# Patient Record
Sex: Female | Born: 1950 | Race: White | Hispanic: No | Marital: Married | Smoking: Current every day smoker
Health system: Southern US, Community
[De-identification: ages and names within clinical notes are randomized; demographics above are authoritative.]

## PROBLEM LIST (undated history)

## (undated) DIAGNOSIS — I2510 Atherosclerotic heart disease of native coronary artery without angina pectoris: Secondary | ICD-10-CM

## (undated) DIAGNOSIS — J449 Chronic obstructive pulmonary disease, unspecified: Secondary | ICD-10-CM

## (undated) DIAGNOSIS — E785 Hyperlipidemia, unspecified: Secondary | ICD-10-CM

## (undated) DIAGNOSIS — D649 Anemia, unspecified: Secondary | ICD-10-CM

## (undated) DIAGNOSIS — I714 Abdominal aortic aneurysm, without rupture, unspecified: Secondary | ICD-10-CM

## (undated) DIAGNOSIS — J439 Emphysema, unspecified: Secondary | ICD-10-CM

## (undated) DIAGNOSIS — K219 Gastro-esophageal reflux disease without esophagitis: Secondary | ICD-10-CM

## (undated) DIAGNOSIS — M1990 Unspecified osteoarthritis, unspecified site: Secondary | ICD-10-CM

## (undated) DIAGNOSIS — K8020 Calculus of gallbladder without cholecystitis without obstruction: Secondary | ICD-10-CM

## (undated) DIAGNOSIS — I219 Acute myocardial infarction, unspecified: Secondary | ICD-10-CM

## (undated) DIAGNOSIS — I10 Essential (primary) hypertension: Secondary | ICD-10-CM

## (undated) DIAGNOSIS — J45909 Unspecified asthma, uncomplicated: Secondary | ICD-10-CM

## (undated) DIAGNOSIS — I779 Disorder of arteries and arterioles, unspecified: Secondary | ICD-10-CM

## (undated) DIAGNOSIS — N1831 Chronic kidney disease, stage 3a: Secondary | ICD-10-CM

## (undated) DIAGNOSIS — I771 Stricture of artery: Secondary | ICD-10-CM

## (undated) DIAGNOSIS — I739 Peripheral vascular disease, unspecified: Secondary | ICD-10-CM

## (undated) HISTORY — DX: Abdominal aortic aneurysm, without rupture: I71.4

## (undated) HISTORY — DX: Essential (primary) hypertension: I10

## (undated) HISTORY — DX: Hyperlipidemia, unspecified: E78.5

## (undated) HISTORY — PX: TUBAL LIGATION: SHX77

## (undated) HISTORY — DX: Peripheral vascular disease, unspecified: I73.9

## (undated) HISTORY — DX: Chronic obstructive pulmonary disease, unspecified: J44.9

## (undated) HISTORY — DX: Chronic kidney disease, stage 3a: N18.31

## (undated) HISTORY — DX: Abdominal aortic aneurysm, without rupture, unspecified: I71.40

## (undated) HISTORY — DX: Disorder of arteries and arterioles, unspecified: I77.9

## (undated) HISTORY — DX: Unspecified osteoarthritis, unspecified site: M19.90

## (undated) HISTORY — PX: CATARACT EXTRACTION: SUR2

## (undated) HISTORY — DX: Acute myocardial infarction, unspecified: I21.9

## (undated) HISTORY — DX: Atherosclerotic heart disease of native coronary artery without angina pectoris: I25.10

## (undated) HISTORY — DX: Gastro-esophageal reflux disease without esophagitis: K21.9

## (undated) HISTORY — DX: Anemia, unspecified: D64.9

## (undated) HISTORY — DX: Stricture of artery: I77.1

## (undated) HISTORY — DX: Calculus of gallbladder without cholecystitis without obstruction: K80.20

## (undated) HISTORY — DX: Emphysema, unspecified: J43.9

## (undated) HISTORY — DX: Unspecified asthma, uncomplicated: J45.909

---

## 2005-02-04 HISTORY — PX: CARDIAC CATHETERIZATION: SHX172

## 2005-02-05 ENCOUNTER — Ambulatory Visit (HOSPITAL_COMMUNITY): Admission: RE | Admit: 2005-02-05 | Discharge: 2005-02-05 | Payer: Self-pay | Admitting: *Deleted

## 2005-02-06 ENCOUNTER — Inpatient Hospital Stay (HOSPITAL_COMMUNITY): Admission: RE | Admit: 2005-02-06 | Discharge: 2005-02-08 | Payer: Self-pay | Admitting: *Deleted

## 2007-04-10 ENCOUNTER — Ambulatory Visit: Payer: Self-pay | Admitting: *Deleted

## 2007-04-25 ENCOUNTER — Encounter (INDEPENDENT_AMBULATORY_CARE_PROVIDER_SITE_OTHER): Payer: Self-pay | Admitting: *Deleted

## 2007-04-25 ENCOUNTER — Ambulatory Visit: Payer: Self-pay | Admitting: *Deleted

## 2007-04-25 ENCOUNTER — Inpatient Hospital Stay (HOSPITAL_COMMUNITY): Admission: RE | Admit: 2007-04-25 | Discharge: 2007-04-26 | Payer: Self-pay | Admitting: *Deleted

## 2007-05-08 ENCOUNTER — Ambulatory Visit: Payer: Self-pay | Admitting: *Deleted

## 2007-09-25 ENCOUNTER — Ambulatory Visit (HOSPITAL_COMMUNITY): Admission: RE | Admit: 2007-09-25 | Discharge: 2007-09-25 | Payer: Self-pay | Admitting: Ophthalmology

## 2007-12-29 ENCOUNTER — Ambulatory Visit: Payer: Self-pay | Admitting: *Deleted

## 2009-12-21 ENCOUNTER — Ambulatory Visit (HOSPITAL_COMMUNITY): Admission: RE | Admit: 2009-12-21 | Discharge: 2009-12-21 | Payer: Self-pay | Admitting: Ophthalmology

## 2010-02-19 ENCOUNTER — Encounter: Payer: Self-pay | Admitting: Family Medicine

## 2010-04-11 LAB — BASIC METABOLIC PANEL
BUN: 9 mg/dL (ref 6–23)
Calcium: 8.9 mg/dL (ref 8.4–10.5)
Creatinine, Ser: 0.74 mg/dL (ref 0.4–1.2)

## 2010-04-11 LAB — HEMOGLOBIN AND HEMATOCRIT, BLOOD
HCT: 38.2 % (ref 36.0–46.0)
Hemoglobin: 12.9 g/dL (ref 12.0–15.0)

## 2010-06-13 NOTE — Op Note (Signed)
Kaitlin [name], Kaitlin [name]                ACCOUNT NO.:  [id number]   MEDICAL RECORD NO.:  [id number]          PATIENT TYPE:  INP   LOCATION:  2899                         FACILITY:  [facility]   PHYSICIAN:  Dorothea Glassman, M.D.    DATE OF BIRTH:  1950-06-20   DATE OF PROCEDURE:  04/25/2007  DATE OF DISCHARGE:                               OPERATIVE REPORT   SURGEON:  As P. Drucie Opitz, MD   ASSISTANTS:  Judeth Cornfield. Scot Dock, MD, and Rocky Morel, RNFA.   ANESTHETIC:  General endotracheal.   ANESTHESIOLOGIST:  Nelda Severe. Tobias Alexander, MD.   PREOPERATIVE DIAGNOSIS:  Severe right internal carotid artery stenosis.   POSTOPERATIVE DIAGNOSIS:  Severe right internal carotid artery stenosis.   PROCEDURE:  Right carotid endarterectomy with Dacron patch angioplasty.   COMPLICATIONS:  None apparent.   CLINICAL NOTE:  Kaitlin [name] is a 60 year old female with a history of  heavy tobacco use.  She has a progressive asymptomatic right internal  carotid artery stenosis.   Seen in consultation in the office, she consented for right carotid  endarterectomy.  Risks of the operative procedure were reviewed with the  patient prior to surgery with a major morbidity and mortality of 1-2% to  include but not limited MI, CVA, cranial nerve injury and death.   OPERATIVE PROCEDURE:  The patient brought to the operating room in  stable hemodynamic condition.  Placed under general endotracheal  anesthesia.  Foley catheter, arterial line in place.  The right neck  prepped and draped in a sterile fashion.   A curvilinear skin incision made on the anterior border of the right  sternomastoid muscle.  Dissection carried down through subcutaneous  tissue with electrocautery.  Platysma incised.  Deep dissection carried  along the upper border of the sternomastoid to expose the vessels.  The  facial vein ligated with 2-0 silk and divided.  The right common carotid  artery mobilized down to the omohyoid muscle and encircled with  vessel  loop.  The origin of the superior thyroid and external carotid were  freed and encircled with vessel loops.  The internal carotid artery  followed distally up to the posterior belly of the digastric muscle and  encircled with a vessel loop.   The patient administered 7000 units of heparin intravenously.  Adequate  circulation time permitted.  The carotid vessels controlled with clamps.  A longitudinal arteriotomy made in the distal common carotid artery.  The arteriotomy extended across carotid bulb and up into the internal  carotid artery.  There was a fibrous plaque at the internal carotid  artery origin with a high-grade stenosis.  The arteriotomy extended  beyond the plaque into the internal carotid artery.  A shunt was  inserted.   The plaque raised with an endarterectomy elevator.  The endarterectomy  carried down into the common carotid artery, where the plaque was  divided transversely with Potts scissors.  The external carotid was  endarterectomized using an eversion technique.  The distal internal  carotid artery plaque feathered out well without distal flap.  Fragments  of plaque removed with fine  forceps.  The site irrigated with heparin  saline solution.   A patch angioplasty of the endarterectomy site then carried out with a  running 6-0 Prolene suture using a Finesse Dacron patch.  At completion  of the patch angioplasty, the shunt was removed and all vessels well-  flushed.  Clamps were removed directing initially antegrade flow up the  external carotid artery.  Following this, the internal carotid was  released.   Excellent pulse and Doppler signal in the distal internal carotid  artery.   The the patient administered 50 mg of protamine intravenously.  Adequate  hemostasis obtained.  Sponge and instrument counts correct.   Sternomastoid fascia closed with running 2-0 Vicryl suture.  Platysma  closed with running 3-0 Vicryl suture.  Skin closed with 4-0  Monocryl.  Dermabond applied.  No apparent complications.  The patient transferred  to the recovery room in stable condition, neurologically intact.      Dorothea Glassman, M.D.  Electronically Signed     PGH/MEDQ  D:  04/25/2007  T:  04/26/2007  Job:  [id number]   cc:   Quay Burow, M.D.  Sherrilee Gilles. Gerarda Fraction, MD

## 2010-06-13 NOTE — Procedures (Signed)
CAROTID DUPLEX EXAM   INDICATION:  Follow up carotid artery disease.   HISTORY:  Diabetes:  No.  Cardiac:  No.  Hypertension:  Yes.  Smoking:  Yes.  Previous Surgery:  Right CEA with DPA on 04/25/2007.  CV History:  No.  Amaurosis Fugax No, Paresthesias No, Hemiparesis No.                                       RIGHT             LEFT  Brachial systolic pressure:         110               111  Brachial Doppler waveforms:         Biphasic          Biphasic  Vertebral direction of flow:        Antegrade         Antegrade  DUPLEX VELOCITIES (cm/sec)  CCA peak systolic                   152               [id number]  ECA peak systolic                   146               0000000  ICA peak systolic                   147               0000000  ICA end diastolic                   59                58  PLAQUE MORPHOLOGY:                  N/A               Calcified  PLAQUE AMOUNT:                      N/A               Mild/moderate  PLAQUE LOCATION:                    N/A               ICA/bifurcation   IMPRESSION:  1. Right internal carotid artery velocities are suggestive of 40-59%      stenosis; however, no plaque visualized.  Velocities may be      elevated due to postoperative diameter reduction.  2. Left internal carotid artery velocities are suggestive of 40-59%      stenosis.   ___________________________________________  P. Drucie Opitz, M.D.   AS/MEDQ  D:  12/29/2007  T:  12/29/2007  Job:  [id number]

## 2010-06-13 NOTE — Assessment & Plan Note (Signed)
OFFICE VISIT   Kaitlin, [name] A  DOB:  1950-10-16                                       05/08/2007  [id number]   Patient underwent a right carotid endarterectomy for severe right  internal carotid artery stenosis, carried out 04/25/07 at [facility].  Discharged home in good condition.  No perioperative  complications.   At this time, she is doing well.  No major complaints.  Blood pressure  is 122/81, pulse 85 per minute, respirations 18 per minute.  Right neck  incision healing unremarkably.  Cranial nerves are intact.  Strength  equal bilaterally.   Patient is doing well following her recent right carotid endarterectomy.  We will plan a followup with her again in six months.   Dorothea Glassman, M.D.  Electronically Signed   PGH/MEDQ  D:  05/08/2007  T:  05/08/2007  Job:  [id number]   cc:   Quay Burow, M.D.  Sherrilee Gilles. Gerarda Fraction, MD

## 2010-06-13 NOTE — Consult Note (Signed)
NEW PATIENT CONSULTATION   Kaitlin, [name] A  DOB:  06/25/1950                                       04/10/2007  [id number]   REFERRING PHYSICIAN:  Quay Burow, MD.   PRIMARY CARE PHYSICIAN:  Redmond School, MD.   REASON FOR CONSULTATION:  Severe progressive right internal carotid  artery stenosis.   HISTORY:  Kaitlin [name] is a 60 year old white female referred at this  time for a progressive asymptomatic right internal carotid artery  stenosis.   Patient has no history of stroke.  She underwent a carotid Doppler  evaluation for [place] revealing a severe  stenosis of the right internal carotid artery with velocities of 387/177  cm/s on 03/28/2007.  A prior carotid Doppler carried out 11/28/2006  revealed velocities of 248/94 cm/s in the right internal carotid artery.  The left internal carotid artery reveals mild disease with a peak  systolic velocity of 123456 cm/s.  Vertebral flow is antegrade bilaterally.   Patient denies a history of documented stroke.  No visual disturbance.  No speech problems.  No motor or sensory deficit.   Risk factors for cerebrovascular disease include tobacco use and  hyperlipidemia.   PAST MEDICAL HISTORY:  1. Hyperlipidemia.  2. Tobacco abuse.  3. GERD.   MEDICATIONS:  1. Plavix 75 mg daily.  2. Lopressor 25 mg b.i.d.  3. Aspirin 81 mg daily.  4. TriCor 145 mg daily.  5. Omega-3 Fish Oil 1000 mg daily.   ALLERGIES:  MUSCLE RELAXERS CAUSE A RASH.   FAMILY HISTORY:  1. This is quite significant for coronary artery disease.  2. Her mother died at age 60 of an MI.  17. Father died at age 45 of an MI.  93. One brother deceased, age 48, of an MI.   SOCIAL HISTORY:  1. The patient is married with 3 children.  2. She is not working.  3. She just discontinued tobacco use.  She has smoked up to 2 packs of      cigarettes daily for many years.  4. No alcohol use.   REVIEW OF SYSTEMS:   Refer to Patient Encounter Form.  Patient notes a  history of some bronchitis.  She also has had a film-like visual  disturbance over her right eye.  Review of systems otherwise, including  GENERAL, CARDIAC, GI, GU, NEUROLOGIC, ORTHOPEDIC, PSYCHIATRIC, BLEEDING,  HEARING AND VISUAL DISTURBANCE is negative.   PHYSICAL EXAMINATION:  General:  A well-appearing 60 year old female.  Appears her stated age.  Alert and oriented.  In no acute distress.  Vital Signs:  BP is 121/74 left arm, right arm 130/77.  Heart rate is 77  per minute.  Respirations were 16 per minute with O2 saturation 99%.  HEENT:  Mouth and throat are clear.  Normocephalic.  No scleral icterus.  Neck:  No masses.  No thyromegaly.  Respiratory:  Equal air entry  bilaterally.  No rales or rhonchi.  Cardiovascular:  Right carotid  bruit.  Normal heart sounds without murmurs.  No gallops or rubs.  Regular rate and rhythm.  Abdomen:  Soft, nontender.  No masses or  organomegaly.  Bowel sounds normal.  Neurologic:  Cranial nerves intact.  Strengths equal bilaterally.  Reflexes 2+.  Normal gait.  Extremities:  Full range of motion.  No deformity.  No joint swelling.  IMPRESSION:  1. Severe progressive asymptomatic right internal carotid artery      stenosis.  2. Hyperlipidemia.  3. Gastroesophageal reflux disease.  4. Coronary artery disease status post stenting.   RECOMMENDATION:  Right carotid endarterectomy for reduction of stroke  risk.  Surgery is scheduled 04/25/2007 [place]  Plavix 04/21/2007, increase aspirin to 325 mg daily.   The potential risks of the operative procedure were reviewedwith a  measurement of morbidity, mortality 1%-2% to include, but not limited  to, MI, CVA, cranial nerve injury and death.   Dorothea Glassman, M.D.  Electronically Signed   PGH/MEDQ  D:  04/10/2007  T:  04/11/2007  Job:  [id number]   cc:   Quay Burow, M.D.  Sherrilee Gilles. Gerarda Fraction, MD

## 2010-06-16 NOTE — Discharge Summary (Signed)
Kaitlin [name], Kaitlin [name]                ACCOUNT NO.:  [id number]   MEDICAL RECORD NO.:  [id number]          PATIENT TYPE:  INP   LOCATION:  6533                         FACILITY:  [facility]   PHYSICIAN:  Leslye Peer, MD       DATE OF BIRTH:  14-May-1950   DATE OF ADMISSION:  02/06/2005  DATE OF DISCHARGE:  02/08/2005                                 DISCHARGE SUMMARY   DISCHARGE DIAGNOSES:  1.  Coronary disease, two-vessel intervention and stenting this admission by      Dr. Einar Gip.  2.  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  3.  Right internal carotid artery stenosis of 70%.  4.  Smoking.  5.  Dyslipidemia.  6.  Hypertension.  7.  Small enzyme leak after percutaneous coronary intervention this      admission.   HOSPITAL COURSE:  The patient is a 60 year old female followed by Dr. Gerarda [name]  and seen in the remote past by Dr. Mathis Bud. She has multiple risk factors  for coronary disease. She presented February 05, 2005 with chest pain  consistent with unstable angina. Cardiolite study in 2004 was negative. She  was admitted and catheterized by Dr. Einar Gip February 06, 2005. This revealed a  99% RCA. She received two nonDES stents to the RCA. The LAD also had a  proximal 95% stenosis and she received a Cypher stent to that site. There  was a residual 50-60% circumflex stenosis. EF was 65%. Right common iliac  was 60-70% and injection of the right carotid during RIMA injection revealed  a 60-70% stenosis. She tolerated the procedure well. There was an enzymes  leak postprocedure with a CK of 139 and 13 MB's. She was kept an extra 24  hours. Dr. Einar Gip feels she can be discharged today February 08, 2005.   DISCHARGE MEDICATIONS:  1.  Prilosec over-the-counter once a day.  2.  Metoprolol 12.5 milligrams b.i.d.  3.  Aspirin 81 milligrams a day.  4.  Nitroglycerin sublingual p.r.n.  5.  Plavix 75 milligrams a day.  6.  Vytorin 20/40 daily.   LABS:  White count 7.9, hemoglobin 14, hematocrit  41.1, platelets 273,000.  Sodium 140, potassium 3.8, BUN 11, creatinine 0.8. Liver functions were  normal. CK peaked at 136 with 8.8 MBs and troponin of 1.04. chest x-ray  shows COPD. EKG reveals a sinus rhythm with T-wave inversion in V2-V5.   DISPOSITION:  The patient was discharged in stable condition. We have  arranged for Plavix through [facility]. We will try to arrange  for her to get some Vytorin samples from the office with those are  available. She will see Dr. Mathis Bud in a couple weeks in the office.      Kaitlin [name], P.A.    ______________________________  Leslye Peer, MD    LKK/MEDQ  D:  02/08/2005  T:  02/08/2005  Job:  [id number]   cc:   Leslye Peer, MD  Fax: [place]. Kaitlin Fraction, MD  Fax: [phone]

## 2010-06-16 NOTE — Cardiovascular Report (Signed)
NAMEEVANJELINA, OCKERMAN                ACCOUNT NO.:  [id number]   MEDICAL RECORD NO.:  [id number]          PATIENT TYPE:  OIB   LOCATION:  6533                         FACILITY:  [facility]   PHYSICIAN:  Ulice Dash R. Einar Gip, MD       DATE OF BIRTH:  05/17/1950   DATE OF PROCEDURE:  02/06/2005  DATE OF DISCHARGE:                              CARDIAC CATHETERIZATION   ATTENDING CARDIOLOGIST:  Leslye Peer, M.D.   REFERRING PHYSICIAN:  Bonne Dolores, M.D.   PROCEDURE PERFORMED:  1.  Left ventriculography.  2.  Right and left coronary arteriography.  3.  Right innominate and left subclavian arteriography.  4.  Abdominal aortogram.  5.  Right femoral angiography.   INDICATIONS FOR PROCEDURE:  Ms. Anselma Morishita is a 60 year old Caucasian  female with a very strong family history of premature coronary artery  disease with history of GERD, history of ongoing tobacco use and  hyperlipidemia, who had abnormal physical exam and also had chest discomfort  typical for agnina pectoris.  Due to multiple cardiac risk factors and  ongoing chest pain, she underwent a Cardiolite stress test. This was low-  risk ischemia that was performed 2004.  However, because of her significant  risk factors, she was directed back to the cardiac catheterization lab to  reevaluate her coronary anatomy.   HEMODYNAMIC DATA:  The left ventricular pressure was  95/-4 with end-  diastolic pressure of 4 mmHg.  Aortic pressure 97/60 with a mean of 77 mmHg.  There was no PG across the AV.   ANGIOGRAPHIC DATA:  Left ventricle: Left ventricular systolic function was  normal, and the ejection fraction was estimated at 60 to 65%.  There was no  significant mitral regurgitation.   Right coronary artery: The right coronary artery was a large dominant  vessel. It gives origin to a large PDA and larger posterolateral artery  which supplies part of the inferior, inferoapical, and inferolateral walls.  There is a high-grade 99% stenosis in  the mid RCA.   Left main: Left main is a large caliber vessel.  It is long.  It is normal.   Circumflex:  The circumflex is a moderate caliber vessel.  It gives origin  to a small OM-1 and a moderate size OM-2 and continues as OM-3.  There is a  50 to at most 60% stenosis in the proximal segment of the circumflex.   LAD: LAD is a moderate to large caliber vessel.  It gives origin to a small  diagonal #1 and a very large diagonal #2 which is equivalent to the LAD.  The LAD ends at the apex.  At the origin of the diagonal #1, there is a high-  grade 95% stenosis.   Right innominate artery: The right innominate artery is a widely patent  right innominate artery and subclavian artery.  The left vertebral carotid  artery which was visualized shows 60 to at most 70% stenosis at its origin.  However, this visualization is suboptimal.   Left subclavian artery: The left subclavian artery and LIMA are widely  patent.  Again, the  left vertebral artery shows 10 to 20% ostial stenosis.  The left carotid artery appears to be widely patent.   Abdominal aortogram: Abdominal aortogram reveals two renal arteries, one on  either side.  They are widely patent.  The right iliac bifurcation was  widely patent.  The right external iliac artery appears to have a 60 at most  70% stenosis.  The femoral artery was widely patent.   IMPRESSION:  1.  Normal end-diastolic function, ejection fraction 60 to 65%, no      significant mitral regurgitation.  2.  High-grade stenosis of a very large dominant right coronary artery in      the mid segment constituting 99% stenosis.  3.  Intermediate lesion into the proximal circumflex, 50 to 60% stenosis.  4.  High-grade 95% stenosis mid left anterior descending artery.  5.  Right internal carotid artery appears to have a 60 to 70% stenosis;      however, the visualization is suboptimal.  6.  Right external iliac artery appears to have a 60 at most 70% stenosis.       The right femoral artery is widely patent.   INTERVENTIONAL DATA:  Successful PTCA and stenting of the mid right coronary  artery with a 4 x 12 mm Vision.  There was proximal dissection noted which  had to be stented with an overlapping 4 x 18 mm Vision stent.  These stents  were post dilated with a 4.5 x 8 mm Quantum at 10 atmospheres of pressure.  There was a very small waist noted at the tightest lesion.  However, because  of the fear of perforation, the lesion was left alone.  Brisk flow without  any haziness was noted at the end of the procedure.   Successful PTCA and stenting of the mid LAD with a 2 x 18 mm Cypher deployed  at 16 atmospheres of pressure.  This stent was post dilated with a 3.25 x 15  mm Quantum at 16 atmospheres of pressure.  There was a nice step-up and a  step-down with reduction of stenosis from 95% to 0%.  Excellent brisk flow  was noted.   RECOMMENDATIONS:  The patient will be continued on risk factor modification.  If she has chest pain, consider outpatient Cardiolite stress test for  evaluation of circumflex significance; otherwise continue with risk factor  modification as indicated.  She may need evaluation for carotid artery and  also peripheral arterial disease.  Continued risk factor modification is  indicated.   A total of 350 mL of contrast was utilized for diagnostic and interventional  procedures.   DIAGNOSTIC PROCEDURE:  Using a 6-French right femoral artery access, 6-  [place] multipurpose B2 catheter was advanced through the ascending aorta  with 0.02 of Angio. The catheter was gently advanced to the left ventricle.  Left ventricular pressure marked.  Hand contrast injection of the left  ventricle was performed both in the LAO and RAO projections.  Catheter was  gently pulled back  to ascending aorta.  Right coronary artery selectively  engaged and angiography was performed.  Then the catheter was pulled back into the arch of the aorta, right  innominate artery, and left subclavian  artery selectively cannulated.  Angiography was performed. Then the catheter  was pulled back in the abdominal aorta, and abdominal aortogram was  performed.  Then the catheter was pulled out of the body in the usual  fashion.  A 6-French Judkins left 4 diagnostic catheter was advanced  and  engaged in the left main coronary artery, and angiography was performed.  Then the catheter was pulled out of the body in the usual fashion.   TECHNIQUE OF INTERVENTION:  Using Angiomax for anticoagulation and patient  having been randomized to CHAMPION trial, the 6-French sheath was exchanged  for a 7-French sheath.  Initially an AR1 catheter and then eventually AL1  catheter was utilized to engage the ostium of the right coronary artery, and  angiography was performed.  A 190 cm x 0.014 inch ATW guidewire was utilized  as a guidewire and crossed through the RCA, and lesion length was measured.  Then direct stenting with a 4 x 12 mm Vision was performed with reduction in  stenosis from 99% to less than 80% with a significant raise in its mid  segment.  This was performed at 14 atmospheres of pressure for 25 seconds.  A second inflation at 16 atmospheres of pressure for 29 seconds was  performed, and this led to a dissection in the proximal segment.  This  dissection was dilated with a stent balloon at 6 atmospheres of pressure for  27 seconds.  Because of persistence of dissection and significant waist in  the stented segment, a 4 x 18 mm Vision stent was advanced into the RCA and  overlapped with the previously placed stent at the inflow, and the stent was  deployed at 10 atmospheres of pressure for 47 seconds, and then the same  stent balloon was pushed between the two stents, and a second inflation of  15 atmospheres pressure for 30 seconds was performed.  Because of  persistence of waist in its mid segment, a 4.5 x 8 mm Quantum was utilized,  and inflation at  10 atmospheres of pressure for 45 seconds was performed.  Balloon was deflated and pulled back into the guiding catheter.  Angiography  was performed.  During the procedure, intracoronary nitroglycerin was also  administered.  Excellent distal flow was noted with excellent angiographic  appearance.  Hence, the guide catheter was disengaged and pulled out of the  body in the usual fashion.   Attention was then directed towards the stenting of the LAD.  A 7-French FL4  guide was advanced in the left coronary artery.  Using an ATW same marker  wire, the LAD lesion was crossed, and lesion length was measured.  This LAD  stenosis was dilated with a 3 x 15 mm Quantum Maverick.  This balloon was  occlusive prior to balloon inflation.  PTCA was performed at 10 atmospheres  of pressure for 15 seconds and second inflation at 12 atmospheres of  pressure for 45 seconds.  Then stenting was performed using a Cypher 5.3 x 18 mm at 16 atmospheres of pressure in the mid LAD.  This stent covered the  proximal part of the LAD and also the mid LAD.  This stent was post dilated  with a 3.25 x 15 mm Quantum at 16 atmospheres of pressure for 45 seconds.  There was a nice step-up and step-down noted in the LAD.  Brisk TIMI-3 flow  was noted.  After confirming the success, the guidewire was pulled out.  The  guide catheter was disengaged and pulled out of the body in the usual  fashion.  The patient tolerated the procedure well.  No immediate  complications were noted.  A total of 350 mL of contrast was utilized for  diagnostic and interventional procedure.      Eden Lathe. Einar Gip,  MD  Electronically Signed     JRG/MEDQ  D:  02/06/2005  T:  02/06/2005  Job:  [id number]   cc:   Leslye Peer, MD  Fax: [phone]   Bonne Dolores, M.D.  Fax: [phone]

## 2010-10-23 LAB — URINALYSIS, ROUTINE W REFLEX MICROSCOPIC
Bilirubin Urine: NEGATIVE
Glucose, UA: NEGATIVE
Ketones, ur: NEGATIVE
Protein, ur: NEGATIVE
Specific Gravity, Urine: 1.006

## 2010-10-23 LAB — CBC
MCHC: 34
MCV: 91.1
MCV: 91.9
Platelets: 196
RBC: 3.88
RBC: 4.68
RDW: 13.1
WBC: 7

## 2010-10-23 LAB — PROTIME-INR: INR: 0.9

## 2010-10-23 LAB — COMPREHENSIVE METABOLIC PANEL
Albumin: 3.6
Alkaline Phosphatase: 97
CO2: 29
Chloride: 103
GFR calc non Af Amer: >60
Potassium: 3.8
Sodium: 141
Total Bilirubin: 0.3

## 2010-10-23 LAB — ABO/RH: ABO/RH(D): O POS

## 2010-10-23 LAB — TYPE AND SCREEN: ABO/RH(D): O POS

## 2010-10-23 LAB — APTT: aPTT: 23 — ABNORMAL LOW

## 2010-10-23 LAB — BASIC METABOLIC PANEL
CO2: 26
Calcium: 8.2 — ABNORMAL LOW
Creatinine, Ser: 0.8
GFR calc Af Amer: >60
GFR calc non Af Amer: >60
Glucose, Bld: 86
Sodium: 141

## 2010-10-27 LAB — BASIC METABOLIC PANEL
CO2: 24
Calcium: 9.1
GFR calc Af Amer: >60
Glucose, Bld: 172 — ABNORMAL HIGH
Potassium: 3.5
Sodium: 135

## 2010-10-27 LAB — HEMOGLOBIN AND HEMATOCRIT, BLOOD: HCT: 42.1

## 2010-12-14 DIAGNOSIS — I739 Peripheral vascular disease, unspecified: Secondary | ICD-10-CM

## 2010-12-14 HISTORY — DX: Peripheral vascular disease, unspecified: I73.9

## 2012-06-13 ENCOUNTER — Other Ambulatory Visit: Payer: Self-pay | Admitting: *Deleted

## 2012-06-13 MED ORDER — CLOPIDOGREL BISULFATE 75 MG PO TABS: 75.0000 mg | ORAL_TABLET | Freq: Every day | ORAL | Status: DC

## 2012-06-23 ENCOUNTER — Encounter: Payer: Self-pay | Admitting: *Deleted

## 2012-06-25 ENCOUNTER — Other Ambulatory Visit: Payer: Self-pay | Admitting: *Deleted

## 2012-06-25 MED ORDER — METOPROLOL TARTRATE 50 MG PO TABS: 50.0000 mg | ORAL_TABLET | Freq: Every day | ORAL | Status: DC

## 2012-06-26 ENCOUNTER — Ambulatory Visit: Payer: Self-pay | Admitting: Cardiovascular Disease

## 2012-07-04 ENCOUNTER — Ambulatory Visit (HOSPITAL_COMMUNITY)
Admission: RE | Admit: 2012-07-04 | Discharge: 2012-07-04 | Disposition: A | Discharge status: Home / Self Care | Payer: 59 | Source: Ambulatory Visit | Attending: Family Medicine | Admitting: Family Medicine

## 2012-07-04 ENCOUNTER — Other Ambulatory Visit (HOSPITAL_COMMUNITY): Payer: Self-pay | Admitting: Family Medicine

## 2012-07-04 DIAGNOSIS — M5126 Other intervertebral disc displacement, lumbar region: Secondary | ICD-10-CM | POA: Insufficient documentation

## 2012-07-04 DIAGNOSIS — I714 Abdominal aortic aneurysm, without rupture, unspecified: Secondary | ICD-10-CM | POA: Insufficient documentation

## 2012-07-04 DIAGNOSIS — M545 Low back pain, unspecified: Secondary | ICD-10-CM | POA: Insufficient documentation

## 2012-07-04 DIAGNOSIS — M51379 Other intervertebral disc degeneration, lumbosacral region without mention of lumbar back pain or lower extremity pain: Secondary | ICD-10-CM | POA: Insufficient documentation

## 2012-07-04 DIAGNOSIS — M47817 Spondylosis without myelopathy or radiculopathy, lumbosacral region: Secondary | ICD-10-CM | POA: Insufficient documentation

## 2012-07-04 DIAGNOSIS — M5137 Other intervertebral disc degeneration, lumbosacral region: Secondary | ICD-10-CM | POA: Insufficient documentation

## 2012-07-04 LAB — POCT I-STAT, CHEM 8
Chloride: 107 mEq/L (ref 96–112)
HCT: 43 % (ref 36.0–46.0)
Hemoglobin: 14.6 g/dL (ref 12.0–15.0)
Potassium: 3.6 mEq/L (ref 3.5–5.1)
Sodium: 140 mEq/L (ref 135–145)

## 2012-07-04 MED ORDER — IOHEXOL 350 MG/ML SOLN
100.0000 mL | Freq: Once | INTRAVENOUS | Status: AC | PRN
  Administered 2012-07-04: 100 mL via INTRAVENOUS

## 2012-07-04 NOTE — Progress Notes (Signed)
Blood sample obtained from right arm IV for Creatnine level.  

## 2012-07-17 ENCOUNTER — Other Ambulatory Visit: Payer: Self-pay | Admitting: Cardiovascular Disease

## 2012-08-25 ENCOUNTER — Other Ambulatory Visit: Payer: Self-pay | Admitting: *Deleted

## 2012-08-25 MED ORDER — METOPROLOL TARTRATE 50 MG PO TABS: 50.0000 mg | ORAL_TABLET | Freq: Every day | ORAL | Status: DC

## 2012-08-25 NOTE — Telephone Encounter (Signed)
Rx was sent to pharmacy electronically. 

## 2012-10-16 ENCOUNTER — Encounter: Payer: Self-pay | Admitting: *Deleted

## 2012-10-17 ENCOUNTER — Encounter: Payer: Self-pay | Admitting: Cardiology

## 2012-10-17 ENCOUNTER — Ambulatory Visit (INDEPENDENT_AMBULATORY_CARE_PROVIDER_SITE_OTHER): Payer: 59 | Admitting: Cardiology

## 2012-10-17 ENCOUNTER — Other Ambulatory Visit: Payer: Self-pay | Admitting: Cardiovascular Disease

## 2012-10-17 VITALS — BP 112/74 | HR 91 | Ht 61.0 in | Wt 126.1 lb

## 2012-10-17 DIAGNOSIS — I6521 Occlusion and stenosis of right carotid artery: Secondary | ICD-10-CM

## 2012-10-17 DIAGNOSIS — I6529 Occlusion and stenosis of unspecified carotid artery: Secondary | ICD-10-CM

## 2012-10-17 DIAGNOSIS — I739 Peripheral vascular disease, unspecified: Secondary | ICD-10-CM

## 2012-10-17 DIAGNOSIS — F17200 Nicotine dependence, unspecified, uncomplicated: Secondary | ICD-10-CM

## 2012-10-17 DIAGNOSIS — Z720 Tobacco use: Secondary | ICD-10-CM | POA: Insufficient documentation

## 2012-10-17 DIAGNOSIS — I10 Essential (primary) hypertension: Secondary | ICD-10-CM

## 2012-10-17 DIAGNOSIS — E785 Hyperlipidemia, unspecified: Secondary | ICD-10-CM

## 2012-10-17 DIAGNOSIS — I25810 Atherosclerosis of coronary artery bypass graft(s) without angina pectoris: Secondary | ICD-10-CM

## 2012-10-17 DIAGNOSIS — I2510 Atherosclerotic heart disease of native coronary artery without angina pectoris: Secondary | ICD-10-CM | POA: Insufficient documentation

## 2012-10-17 DIAGNOSIS — E782 Mixed hyperlipidemia: Secondary | ICD-10-CM

## 2012-10-17 MED ORDER — LISINOPRIL 5 MG PO TABS: 5.0000 mg | ORAL_TABLET | Freq: Every day | ORAL | Status: DC

## 2012-10-17 MED ORDER — METOPROLOL SUCCINATE ER 25 MG PO TB24: 25.0000 mg | ORAL_TABLET | Freq: Every day | ORAL | Status: DC

## 2012-10-17 NOTE — Progress Notes (Signed)
Clinical Summary [name] is a 62 y.o.female former patient of [facility]  1. CAD - w/ DES to LAD and BMSx2 to RCA Jan 2007, reported normal LV function from notes. The indication was unstable angina. - the RCA PCI was complicated by dissection w/ was also stented - denies any chest pain. Denies any significant SOB or DOE. Walks on a regular basis without any significant symptoms, this is her highest level of exertion - compliant w/ meds: ASA 81, plavix, crestor, metoprolol, fish oil.   2. PAD - 60-70% right external iliac artery stenosis - denies any significant claudication, no rest pain, no sores on feet  3. Carotid stenosis - prior right carotid endarterectomy - denies any vision changes, no slurred speech, no focal neuro deficits.   4. HTN - compliant w/ meds - does not check bp at home  5. HL - on statin, compliant.  - has not had recent panel  6. Tobacco - continue to smoke, has tried NRT and nicotine in the past w/ little benefit   Past Medical History  Diagnosis Date  . CAD (coronary artery disease)     stents  . Hypertension   . Hyperlipidemia   . Iliac artery stenosis, right     60%-70%  . PAD (peripheral artery disease) 12/14/2010    in the left vertebral, bilateral carotids  . GERD (gastroesophageal reflux disease)      NKDA   Current Outpatient Prescriptions  Medication Sig Dispense Refill  . aspirin 81 MG tablet Take 81 mg by mouth daily.      . clopidogrel (PLAVIX) 75 MG tablet Take 1 tablet (75 mg total) by mouth daily.  30 tablet  6  . CRESTOR 40 MG tablet TAKE ONE TABLET DAILY AT BEDTIME  30 tablet  1  . ezetimibe (ZETIA) 10 MG tablet Take 10 mg by mouth daily.      . fish oil-omega-3 fatty acids 1000 MG capsule Take 1 g by mouth daily.      . metoprolol (LOPRESSOR) 50 MG tablet Take 1 tablet (50 mg total) by mouth daily.  30 tablet  0   No current facility-administered medications for this visit.     Past Surgical  History  Procedure Laterality Date  . Cardiac catheterization  02/04/2005    A cypher 5.3 x 18 mm at 16 atmosphere of pressure in the mid LAD, this stent covered the proximal part of the LAD and also the mid LAD, this was then post dilated with 3.25 x 15 mm Quantum at 16 atmosphereof pressure for 45 seconds     Family History Patients reports several family members have cardiovascular disease   Social History + tobacco history, 1/2PPD x 40 years. No EtoH. She works as Freight forwarder  Review of Systems 12 point ROS negative other than reported in HPI  Physical Examination p 91 bp 112/74 Wt 126 lbs BMI 23 Gen: resting comfortably, NAD HEENT: no scleral icterus, pupils equal round and reactive, no palptable cervical adenopathy CV Pulm: CTAB Abd: soft, NT, ND NABS, no hepatosplenomegaly Ext: warm, no edema.  Skin: warm, no rash Neuro: A&Ox3, no focal deficits    Diagnostic Studies  2007 cath: LM normal, LAD 95%, RCA 99%, LCX 60-70% lesion. Patent renal arteries. LAD and RCA stented, myoview done after showed no ischemia including in the LCX territory.   07/2006 Myoview: No perfusion defects, LVEF 68%  11/2010 Carotid Doppler: Right CEA patent. Left ICA 0-49%.  Assessment and Plan  1.CAD: no current symptoms, appears to have been left on long term plavix due to the complex PCI w/ overlapping stents in the RCA. - continue current meds, will change metoprolol to Toprol XL to increase her compliance, she often forgets to take it bid - start ASA in the setting of CV disease  2. HTN: bp at goal - changing to Toprol to increase compliance - add low dose ACE-I for benefits in CV patients - check BMP in 3 weeks  3. HL: continue current therapy, no recent panel in our system - check lipid panel   4. Carotid stenosis: prior right endarterectomy - most recent Dopplers show patency of CEA, no significant stenosis on left.  - no significant murmur on exam - continue  surveillance  5. PAD: seen on prior cath - no current symptoms - continue current medical therapy - counseled on tobacco cessation  6. Tobacco - precontemplative, encouraged to stop given multiple related comorbidities   F/u 1 month  Arnoldo Lenis, M.D., F.A.C.C.

## 2012-10-17 NOTE — Telephone Encounter (Signed)
Patient is now seeing Dr. Carlyle Dolly at

## 2012-10-17 NOTE — Patient Instructions (Addendum)
Your physician recommends that you schedule a follow-up appointment in: [place] recommends that you return for lab work in: [place] (Your physician recommends that you have follow up lab work, we will mail you a reminder letter to alert you when to go [employer], located across the street from our office.)  Your physician has recommended you make the following change in your medication:   1) STOP TAKING LOPRESSOR 2) START TAKING TOPROL XL 25 MG ONCE DAILY 3) START TAKING LISINOPRIL 5MG  ONCE DAILY

## 2012-10-22 ENCOUNTER — Encounter: Payer: Self-pay | Admitting: *Deleted

## 2012-10-22 ENCOUNTER — Other Ambulatory Visit: Payer: Self-pay | Admitting: *Deleted

## 2012-10-22 DIAGNOSIS — I10 Essential (primary) hypertension: Secondary | ICD-10-CM

## 2012-10-22 DIAGNOSIS — E782 Mixed hyperlipidemia: Secondary | ICD-10-CM

## 2012-10-23 ENCOUNTER — Ambulatory Visit: Payer: 59 | Admitting: Cardiovascular Disease

## 2012-10-25 LAB — LIPID PANEL
HDL: 43 mg/dL (ref >39)
LDL Cholesterol: 61 mg/dL (ref 0–99)
Total CHOL/HDL Ratio: 3 Ratio
Triglycerides: 123 mg/dL (ref <150)

## 2012-10-25 LAB — BASIC METABOLIC PANEL
CO2: 28 mEq/L (ref 19–32)
Chloride: 109 mEq/L (ref 96–112)
Potassium: 4.1 mEq/L (ref 3.5–5.3)
Sodium: 141 mEq/L (ref 135–145)

## 2012-10-29 ENCOUNTER — Encounter: Payer: Self-pay | Admitting: *Deleted

## 2012-11-14 ENCOUNTER — Ambulatory Visit (INDEPENDENT_AMBULATORY_CARE_PROVIDER_SITE_OTHER): Payer: 59 | Admitting: Cardiology

## 2012-11-14 ENCOUNTER — Encounter: Payer: Self-pay | Admitting: Cardiology

## 2012-11-14 VITALS — BP 119/71 | HR 87 | Ht 61.0 in | Wt 128.0 lb

## 2012-11-14 DIAGNOSIS — I25810 Atherosclerosis of coronary artery bypass graft(s) without angina pectoris: Secondary | ICD-10-CM

## 2012-11-14 NOTE — Progress Notes (Signed)
Clinical Summary Ms. Halbrook is a 62 y.o.female seen today for follow up of the following problems.    1. CAD  - w/ DES to LAD and BMSx2 to RCA Jan 2007, reported normal LV function from notes. The indication was unstable angina.  - the RCA PCI was complicated by dissection w/ was also stented  - denies any chest pain. Denies any significant SOB or DOE. Walks on a regular basis without any significant symptoms, this is her highest level of exertion  - compliant with meds. Last visit changed to Toprol XL because she was having trouble taking lopressor bid. Also strated low dose lisinopril in the setting of known cardiovascular disease. Notes some occasional lightheadedness with bending over since starting these meds, otherwise no symptoms.   2. PAD  - 60-70% right external iliac artery stenosis  - denies any significant claudication, no rest pain, no sores on feet   3. Carotid stenosis  - prior right carotid endarterectomy  - denies any vision changes, no slurred speech, no focal neuro deficits.   4. HTN  - compliant w/ meds  - does not check bp at home - started on lisinpril last visit, labs show stable Cr and BUN   5. Hyperlipidemia - compliant with statin - denies any significant side effects    Past Medical History  Diagnosis Date  . CAD (coronary artery disease)     stents  . Hypertension   . Hyperlipidemia   . Iliac artery stenosis, right     60%-70%  . PAD (peripheral artery disease) 12/14/2010    in the left vertebral, bilateral carotids  . GERD (gastroesophageal reflux disease)      Not on File   Current Outpatient Prescriptions  Medication Sig Dispense Refill  . aspirin 81 MG tablet Take 81 mg by mouth daily.      . clopidogrel (PLAVIX) 75 MG tablet Take 1 tablet (75 mg total) by mouth daily.  30 tablet  6  . CRESTOR 40 MG tablet TAKE ONE TABLET DAILY AT BEDTIME  30 tablet  1  . fish oil-omega-3 fatty acids 1000 MG capsule Take 1 g by mouth daily.       Marland Kitchen lisinopril (PRINIVIL,ZESTRIL) 5 MG tablet Take 1 tablet (5 mg total) by mouth daily.  90 tablet  3  . metoprolol succinate (TOPROL XL) 25 MG 24 hr tablet Take 1 tablet (25 mg total) by mouth daily.  30 tablet  11   No current facility-administered medications for this visit.     Past Surgical History  Procedure Laterality Date  . Cardiac catheterization  02/04/2005    A cypher 5.3 x 18 mm at 16 atmosphere of pressure in the mid LAD, this stent covered the proximal part of the LAD and also the mid LAD, this was then post dilated with 3.25 x 15 mm Quantum at 16 atmosphereof pressure for 45 seconds     Not on File    No family history on file.   Social History Ms. Skelly reports that she has been smoking Cigarettes.  She has a 22.5 pack-year smoking history. She does not have any smokeless tobacco history on file. Ms. Thaden reports that she does not drink alcohol.   Review of Systems CONSTITUTIONAL: No weight loss, fever, chills, weakness or fatigue.  HEENT: Eyes: No visual loss, blurred vision, double vision or yellow sclerae.No hearing loss, sneezing, congestion, runny nose or sore throat.  SKIN: No rash or itching.  CARDIOVASCULAR:  per HPI RESPIRATORY: No shortness of breath, cough or sputum.  GASTROINTESTINAL: No anorexia, nausea, vomiting or diarrhea. No abdominal pain or blood.  GENITOURINARY: No burning on urination, no polyuria NEUROLOGICAL: No headache, dizziness, syncope, paralysis, ataxia, numbness or tingling in the extremities. No change in bowel or bladder control.  MUSCULOSKELETAL: No muscle, back pain, joint pain or stiffness.  LYMPHATICS: No enlarged nodes. No history of splenectomy.  PSYCHIATRIC: No history of depression or anxiety.  ENDOCRINOLOGIC: No reports of sweating, cold or heat intolerance. No polyuria or polydipsia.  Marland Kitchen   Physical Examination p 87 bp 119/71 Wt 128 lbs BMI 24 Gen: resting comfortably, no acute distress HEENT: no scleral  icterus, pupils equal round and reactive, no palptable cervical adenopathy,  CV: RRR, no m/r/g, no JVD, no carotid bruits Resp: Clear to auscultation bilaterally GI: abdomen is soft, non-tender, non-distended, normal bowel sounds, no hepatosplenomegaly MSK: extremities are warm, no edema.  Skin: warm, no rash Neuro:  no focal deficits Psych: appropriate affect   Diagnostic Studies Pertinent labs Oct 2014 Na 141 K 4.1 Cr 0.81  TC 129 TG 123 HDL 43 LDL 61    Assessment and Plan  1.CAD: no current symptoms, appears to have been left on long term plavix due to the complex PCI w/ overlapping stents in the RCA.  - continue current meds  2. HTN: bp at goal  - at goal, continue current meds - stable Cr and K after starting lisinopril  3. HL:  - continue current therapy -  Lipids at goal per most recent panel  4. Carotid stenosis: prior right endarterectomy  - most recent Dopplers show patency of CEA, no significant stenosis on left.  - no significant murmur on exam  - continue surveillance   5. PAD: seen on prior cath  - no current symptoms  - continue current medical therapy  - counseled on tobacco cessation   6. Tobacco  - precontemplative, encouraged to stop given multiple related comorbidities       Arnoldo Lenis, M.D., F.A.C.C.

## 2012-11-14 NOTE — Patient Instructions (Addendum)
Your physician recommends that you schedule a follow-up appointment in: 6 months  

## 2013-01-09 ENCOUNTER — Ambulatory Visit (HOSPITAL_COMMUNITY)
Admission: RE | Admit: 2013-01-09 | Discharge: 2013-01-09 | Disposition: A | Discharge status: Home / Self Care | Payer: 59 | Source: Ambulatory Visit | Attending: Family Medicine | Admitting: Family Medicine

## 2013-01-09 ENCOUNTER — Other Ambulatory Visit (HOSPITAL_COMMUNITY): Payer: Self-pay | Admitting: Family Medicine

## 2013-01-09 DIAGNOSIS — M539 Dorsopathy, unspecified: Secondary | ICD-10-CM

## 2013-01-09 DIAGNOSIS — M461 Sacroiliitis, not elsewhere classified: Secondary | ICD-10-CM | POA: Insufficient documentation

## 2013-01-09 DIAGNOSIS — IMO0002 Reserved for concepts with insufficient information to code with codable children: Secondary | ICD-10-CM

## 2013-01-09 DIAGNOSIS — I714 Abdominal aortic aneurysm, without rupture: Secondary | ICD-10-CM

## 2013-01-09 DIAGNOSIS — M25559 Pain in unspecified hip: Secondary | ICD-10-CM | POA: Insufficient documentation

## 2013-01-15 ENCOUNTER — Ambulatory Visit (HOSPITAL_COMMUNITY)
Admission: RE | Admit: 2013-01-15 | Discharge: 2013-01-15 | Disposition: A | Discharge status: Home / Self Care | Payer: 59 | Source: Ambulatory Visit | Attending: Family Medicine | Admitting: Family Medicine

## 2013-01-15 DIAGNOSIS — I714 Abdominal aortic aneurysm, without rupture, unspecified: Secondary | ICD-10-CM | POA: Insufficient documentation

## 2013-01-23 ENCOUNTER — Other Ambulatory Visit: Payer: Self-pay | Admitting: Cardiovascular Disease

## 2013-01-26 ENCOUNTER — Telehealth: Payer: Self-pay | Admitting: Cardiology

## 2013-01-26 MED ORDER — CLOPIDOGREL BISULFATE 75 MG PO TABS: 75.0000 mg | ORAL_TABLET | Freq: Every day | ORAL | Status: DC

## 2013-01-26 NOTE — Telephone Encounter (Signed)
Medication sent via escribe.  

## 2013-01-26 NOTE — Telephone Encounter (Signed)
Received fax refill request  Rx # [id number] Medication:  Clopidogrel Sisulfat 75 mg  Qty 30  Sig:  Take one tablet by mouth every day Physician:  Harl Bowie

## 2013-05-28 ENCOUNTER — Ambulatory Visit: Payer: 59 | Admitting: Cardiology

## 2013-05-28 ENCOUNTER — Ambulatory Visit (INDEPENDENT_AMBULATORY_CARE_PROVIDER_SITE_OTHER): Payer: 59 | Admitting: Cardiology

## 2013-05-28 ENCOUNTER — Encounter: Payer: Self-pay | Admitting: Cardiology

## 2013-05-28 VITALS — BP 104/48 | HR 84 | Ht 61.0 in | Wt 120.0 lb

## 2013-05-28 DIAGNOSIS — I25810 Atherosclerosis of coronary artery bypass graft(s) without angina pectoris: Secondary | ICD-10-CM

## 2013-05-28 DIAGNOSIS — I6529 Occlusion and stenosis of unspecified carotid artery: Secondary | ICD-10-CM

## 2013-05-28 DIAGNOSIS — Z720 Tobacco use: Secondary | ICD-10-CM

## 2013-05-28 DIAGNOSIS — I10 Essential (primary) hypertension: Secondary | ICD-10-CM

## 2013-05-28 DIAGNOSIS — F17200 Nicotine dependence, unspecified, uncomplicated: Secondary | ICD-10-CM

## 2013-05-28 DIAGNOSIS — I739 Peripheral vascular disease, unspecified: Secondary | ICD-10-CM

## 2013-05-28 DIAGNOSIS — E782 Mixed hyperlipidemia: Secondary | ICD-10-CM

## 2013-05-28 MED ORDER — LISINOPRIL 5 MG PO TABS: 2.5000 mg | ORAL_TABLET | Freq: Every day | ORAL | Status: DC

## 2013-05-28 NOTE — Progress Notes (Signed)
Clinical Summary [name] is a 63 y.o.female seen today for follow up of the following medical problems.   1. CAD  - w/ DES to LAD and BMSx2 to RCA Jan 2007, reported normal LV function from notes. The indication was unstable angina.  - the RCA PCI was complicated by dissection which was also stented   - denies any significant chest pain. Denies any significant SOB or DOE.  - compliant with meds  2. PAD  - 60-70% right external iliac artery stenosis  - denies any significant claudication, no rest pain, no sores on feet. Can have right hip pain, or right shin pain.   3. Carotid stenosis  - prior right carotid endarterectomy  - denies any vision changes, no slurred speech, no focal neuro deficits.   4. HTN  - compliant w/ meds  - does have some dizziness at times, often with standing.   5. Hyperlipidemia  - compliant with statin  - 09/2012 TC 129 TG 123 HDL 43 LDL 61  6. Tobacco - 1 ppd. States chantix did not help.    Past Medical History  Diagnosis Date  . CAD (coronary artery disease)     stents  . Hypertension   . Hyperlipidemia   . Iliac artery stenosis, right     60%-70%  . PAD (peripheral artery disease) 12/14/2010    in the left vertebral, bilateral carotids  . GERD (gastroesophageal reflux disease)      No Known Allergies   Current Outpatient Prescriptions  Medication Sig Dispense Refill  . aspirin 81 MG tablet Take 81 mg by mouth daily.      . clopidogrel (PLAVIX) 75 MG tablet Take 1 tablet (75 mg total) by mouth daily.  30 tablet  6  . CRESTOR 40 MG tablet TAKE ONE TABLET DAILY AT BEDTIME  30 tablet  1  . fish oil-omega-3 fatty acids 1000 MG capsule Take 1 g by mouth daily.      Marland Kitchen lisinopril (PRINIVIL,ZESTRIL) 5 MG tablet Take 1 tablet (5 mg total) by mouth daily.  90 tablet  3  . metoprolol succinate (TOPROL XL) 25 MG 24 hr tablet Take 1 tablet (25 mg total) by mouth daily.  30 tablet  11   No current facility-administered medications for  this visit.     Past Surgical History  Procedure Laterality Date  . Cardiac catheterization  02/04/2005    A cypher 5.3 x 18 mm at 16 atmosphere of pressure in the mid LAD, this stent covered the proximal part of the LAD and also the mid LAD, this was then post dilated with 3.25 x 15 mm Quantum at 16 atmosphereof pressure for 45 seconds     No Known Allergies    No family history on file.   Social History [name] reports that she has been smoking Cigarettes.  She has a 22.5 pack-year smoking history. She does not have any smokeless tobacco history on file. [name] reports that she does not drink alcohol.   Review of Systems CONSTITUTIONAL: No weight loss, fever, chills, weakness or fatigue.  HEENT: Eyes: No visual loss, blurred vision, double vision or yellow sclerae.No hearing loss, sneezing, congestion, runny nose or sore throat.  SKIN: No rash or itching.  CARDIOVASCULAR: per HPI RESPIRATORY: No shortness of breath, cough or sputum.  GASTROINTESTINAL: No anorexia, nausea, vomiting or diarrhea. No abdominal pain or blood.  GENITOURINARY: No burning on urination, no polyuria NEUROLOGICAL: No headache, dizziness, syncope, paralysis, ataxia,  numbness or tingling in the extremities. No change in bowel or bladder control.  MUSCULOSKELETAL: leg and hip pain LYMPHATICS: No enlarged nodes. No history of splenectomy.  PSYCHIATRIC: No history of depression or anxiety.  ENDOCRINOLOGIC: No reports of sweating, cold or heat intolerance. No polyuria or polydipsia.  Marland Kitchen   Physical Examination p 84 bp 104/48 Wt 120 lbs BMI 23 Gen: resting comfortably, no acute distress HEENT: no scleral icterus, pupils equal round and reactive, no palptable cervical adenopathy,  CV: RRR, no m/r/g, no JVD, right carotid bruit Resp: Clear to auscultation bilaterally GI: abdomen is soft, non-tender, non-distended, normal bowel sounds, no hepatosplenomegaly MSK: extremities are warm, no edema.    Skin: warm, no rash Neuro:  no focal deficits Psych: appropriate affect    Assessment and Plan  1.CAD: no current symptoms, appears to have been left on long term plavix due to the complex PCI w/ overlapping stents in the RCA.  - continue current meds   2. HTN - describes occasional dizziness with standing, will decrease lisinopril to 2.5mg  daily  3. HL:  - continue current therapy  - Lipids at goal per most recent panel   4. Carotid stenosis: prior right endarterectomy  - repeat US   5. PAD: seen on prior cath  - no current symptoms  - continue current medical therapy  - counseled on tobacco cessation   6. Tobacco  - precontemplative, encouraged to stop given multiple related comorbidities       F/u 4 months  Arnoldo Lenis, M.D., F.A.C.C.

## 2013-05-28 NOTE — Patient Instructions (Signed)
Your physician wants you to follow-up in: 6 months You will receive a reminder letter in the mail two months in advance. If you don't receive a letter, please call our office to schedule the follow-up appointment    Your physician has recommended you make the following change in your medication:   DECREASE Lisinopril to 2.5 mg daily    Your physician has requested that you have a carotid duplex. This test is an ultrasound of the carotid arteries in your neck. It looks at blood flow through these arteries that supply the brain with blood. Allow one hour for this exam. There are no restrictions or special instructions.    Thank you for choosing [facility] !

## 2013-06-04 ENCOUNTER — Ambulatory Visit (HOSPITAL_COMMUNITY)
Admission: RE | Admit: 2013-06-04 | Discharge: 2013-06-04 | Disposition: A | Discharge status: Home / Self Care | Payer: 59 | Source: Ambulatory Visit | Attending: Cardiology | Admitting: Cardiology

## 2013-06-04 DIAGNOSIS — I2510 Atherosclerotic heart disease of native coronary artery without angina pectoris: Secondary | ICD-10-CM | POA: Insufficient documentation

## 2013-06-04 DIAGNOSIS — I6529 Occlusion and stenosis of unspecified carotid artery: Secondary | ICD-10-CM

## 2013-06-04 DIAGNOSIS — E785 Hyperlipidemia, unspecified: Secondary | ICD-10-CM | POA: Insufficient documentation

## 2013-06-04 DIAGNOSIS — I658 Occlusion and stenosis of other precerebral arteries: Secondary | ICD-10-CM | POA: Insufficient documentation

## 2013-08-04 ENCOUNTER — Other Ambulatory Visit (HOSPITAL_COMMUNITY): Payer: Self-pay | Admitting: Family Medicine

## 2013-08-04 DIAGNOSIS — I714 Abdominal aortic aneurysm, without rupture, unspecified: Secondary | ICD-10-CM

## 2013-08-07 ENCOUNTER — Ambulatory Visit (HOSPITAL_COMMUNITY)
Admission: RE | Admit: 2013-08-07 | Discharge: 2013-08-07 | Disposition: A | Discharge status: Home / Self Care | Payer: 59 | Source: Ambulatory Visit | Attending: Family Medicine | Admitting: Family Medicine

## 2013-08-07 DIAGNOSIS — I714 Abdominal aortic aneurysm, without rupture, unspecified: Secondary | ICD-10-CM | POA: Insufficient documentation

## 2013-08-10 ENCOUNTER — Other Ambulatory Visit (HOSPITAL_COMMUNITY): Payer: 59

## 2013-09-04 ENCOUNTER — Telehealth: Payer: Self-pay | Admitting: *Deleted

## 2013-09-04 MED ORDER — CLOPIDOGREL BISULFATE 75 MG PO TABS: 75.0000 mg | ORAL_TABLET | Freq: Every day | ORAL | Status: DC

## 2013-09-04 NOTE — Telephone Encounter (Signed)
Refill request complete 

## 2013-09-04 NOTE — Telephone Encounter (Signed)
[facility] CLOPIDOGREL BISULFAT 75 MG #30

## 2013-10-09 ENCOUNTER — Encounter: Payer: Self-pay | Admitting: Cardiology

## 2013-10-09 ENCOUNTER — Ambulatory Visit (INDEPENDENT_AMBULATORY_CARE_PROVIDER_SITE_OTHER): Payer: 59 | Admitting: Cardiology

## 2013-10-09 VITALS — BP 96/52 | HR 78 | Ht 61.0 in | Wt 121.0 lb

## 2013-10-09 DIAGNOSIS — E782 Mixed hyperlipidemia: Secondary | ICD-10-CM

## 2013-10-09 DIAGNOSIS — F17200 Nicotine dependence, unspecified, uncomplicated: Secondary | ICD-10-CM

## 2013-10-09 DIAGNOSIS — R0602 Shortness of breath: Secondary | ICD-10-CM

## 2013-10-09 DIAGNOSIS — I2510 Atherosclerotic heart disease of native coronary artery without angina pectoris: Secondary | ICD-10-CM

## 2013-10-09 DIAGNOSIS — I10 Essential (primary) hypertension: Secondary | ICD-10-CM

## 2013-10-09 DIAGNOSIS — I739 Peripheral vascular disease, unspecified: Secondary | ICD-10-CM

## 2013-10-09 DIAGNOSIS — Z720 Tobacco use: Secondary | ICD-10-CM

## 2013-10-09 MED ORDER — METOPROLOL SUCCINATE ER 25 MG PO TB24: 12.5000 mg | ORAL_TABLET | Freq: Every day | ORAL | Status: DC

## 2013-10-09 MED ORDER — CLOPIDOGREL BISULFATE 75 MG PO TABS: 75.0000 mg | ORAL_TABLET | Freq: Every day | ORAL | Status: DC

## 2013-10-09 MED ORDER — ROSUVASTATIN CALCIUM 40 MG PO TABS: 40.0000 mg | ORAL_TABLET | Freq: Every day | ORAL | Status: DC

## 2013-10-09 MED ORDER — LISINOPRIL 5 MG PO TABS: 2.5000 mg | ORAL_TABLET | Freq: Every day | ORAL | Status: DC

## 2013-10-09 MED ORDER — NITROGLYCERIN 0.4 MG SL SUBL: 0.4000 mg | SUBLINGUAL_TABLET | SUBLINGUAL | Status: DC | PRN

## 2013-10-09 NOTE — Patient Instructions (Signed)
Your physician wants you to follow-up in: 6 months You will receive a reminder letter in the mail two months in advance. If you don't receive a letter, please call our office to schedule the follow-up appointment.    Your physician has recommended you make the following change in your medication:    DECREASE Toprol to 12.5 mg daily  I have filled all your prescriptions for 90 days   Your physician has recommended that you have a pulmonary function test. Pulmonary Function Tests are a group of tests that measure how well air moves in and out of your lungs.      Thank you for choosing [facility] !

## 2013-10-09 NOTE — Progress Notes (Addendum)
Clinical Summary Ms. Gmerek is a 63 y.o.female seen today for follow up of the following medical problems.   1. CAD  - w/ DES to LAD and BMSx2 to RCA Jan 2007, reported normal LV function from notes. The indication was unstable angina.  - the RCA PCI was complicated by dissection which was also stented  - denies any significant chest pain. Denies any siginficant DOE, can have some generalized fatigue.  - compliant with meds    2. PAD  - 60-70% right external iliac artery stenosis  - denies any significant claudication, no rest pain, no sores on feet. Can have right hip pain, or right shin pain.   3. Carotid stenosis  - prior right carotid endarterectomy  - denies any vision changes, no slurred speech, no focal neuro deficits.  - repeat US [id number] LICA [id number], patent right CEA  4. HTN  - compliant w/ meds  - does have some dizziness at times, often with standing.   5. Hyperlipidemia  - compliant with statin   6. Tobacco abuse  - <1 ppd. States chantix did not help.  - has some nicotine patches at home but has not tried  Past Medical History  Diagnosis Date  . CAD (coronary artery disease)     stents  . Hypertension   . Hyperlipidemia   . Iliac artery stenosis, right     60%-70%  . PAD (peripheral artery disease) 12/14/2010    in the left vertebral, bilateral carotids  . GERD (gastroesophageal reflux disease)      No Known Allergies   Current Outpatient Prescriptions  Medication Sig Dispense Refill  . aspirin 81 MG tablet Take 81 mg by mouth daily.      . clopidogrel (PLAVIX) 75 MG tablet Take 1 tablet (75 mg total) by mouth daily.  30 tablet  6  . CRESTOR 40 MG tablet TAKE ONE TABLET DAILY AT BEDTIME  30 tablet  1  . fish oil-omega-3 fatty acids 1000 MG capsule Take 1 g by mouth daily.      Marland Kitchen lisinopril (PRINIVIL,ZESTRIL) 5 MG tablet Take 0.5 tablets (2.5 mg total) by mouth daily.  45 tablet  3  . metoprolol succinate (TOPROL XL) 25 MG 24 hr tablet Take 1  tablet (25 mg total) by mouth daily.  30 tablet  11   No current facility-administered medications for this visit.     Past Surgical History  Procedure Laterality Date  . Cardiac catheterization  02/04/2005    A cypher 5.3 x 18 mm at 16 atmosphere of pressure in the mid LAD, this stent covered the proximal part of the LAD and also the mid LAD, this was then post dilated with 3.25 x 15 mm Quantum at 16 atmosphereof pressure for 45 seconds     No Known Allergies    No family history on file.   Social History Ms. Cozzolino reports that she has been smoking Cigarettes.  She has a 22.5 pack-year smoking history. She does not have any smokeless tobacco history on file. Ms. Corts reports that she does not drink alcohol.   Review of Systems CONSTITUTIONAL: No weight loss, fever, chills, weakness or fatigue.  HEENT: Eyes: No visual loss, blurred vision, double vision or yellow sclerae.No hearing loss, sneezing, congestion, runny nose or sore throat.  SKIN: No rash or itching.  CARDIOVASCULAR: per HPI RESPIRATORY: No shortness of breath, cough or sputum.  GASTROINTESTINAL: No anorexia, nausea, vomiting or diarrhea. No abdominal pain or blood.  GENITOURINARY: No burning on urination, no polyuria NEUROLOGICAL: No headache, dizziness, syncope, paralysis, ataxia, numbness or tingling in the extremities. No change in bowel or bladder control.  MUSCULOSKELETAL: No muscle, back pain, joint pain or stiffness.  LYMPHATICS: No enlarged nodes. No history of splenectomy.  PSYCHIATRIC: No history of depression or anxiety.  ENDOCRINOLOGIC: No reports of sweating, cold or heat intolerance. No polyuria or polydipsia.  Marland Kitchen   Physical Examination p 78 bp 96/52 Wt 121 lbs BMI 23 Gen: resting comfortably, no acute distress HEENT: no scleral icterus, pupils equal round and reactive, no palptable cervical adenopathy,  CV: RRR, no m/r/g, no JVD, +bilateral carotid bruits Resp: Coasre bilaterally GI:  abdomen is soft, non-tender, non-distended, normal bowel sounds, no hepatosplenomegaly MSK: extremities are warm, no edema.  Skin: warm, no rash Neuro:  no focal deficits Psych: appropriate affect   Diagnostic Studies 06/2013 carotid US IMPRESSION:  1. Moderate to large amount of left-sided atherosclerotic plaque  results in borderline elevated peak systolic velocities within the  proximal aspect of the left internal carotid artery which approaches  the 50% luminal narrowing range. Further evaluation could be  performed with CTA as clinically indicated.  2. Intimal wall thickening / postsurgical change involving the  distal aspect of the right common carotid artery, carotid bulb and  proximal aspects of the right internal carotid artery, not resulting  in a hemodynamically significant stenosis.   10/09/13 Clinic EKG NSR, no ishcemic changes  Assessment and Plan  1.CAD: no current symptoms, appears to have been left on long term plavix due to the complex PCI w/ overlapping stents in the RCA.  - continue current meds   2. HTN  - on the low side in clinic today, decrease Toprol XL to 12.5mg  daily  3. HL:  - continue current therapy, lipids are at goal  4. Carotid stenosis: prior right endarterectomy  - no current symptoms - recent US shows no new significant disease, continue to monitor  5. PAD: seen on prior cath  - no current symptoms  - continue current medical therapy  - counseled on tobacco cessation   6. Tobacco  - counseled on associated health risk given her known CAD, PAD, and carotid disease - encouraged to use her home nicotine patches - describes SOB at times, prior CXRs show evidence of hyperinflation/COPD changes. No recent PFTs, will order   F/u 6 months    Arnoldo Lenis, M.D., F.A.C.C.

## 2013-10-21 ENCOUNTER — Encounter (HOSPITAL_COMMUNITY): Payer: 59

## 2013-10-30 ENCOUNTER — Ambulatory Visit (HOSPITAL_COMMUNITY): Admission: RE | Admit: 2013-10-30 | Payer: 59 | Source: Ambulatory Visit

## 2013-12-03 ENCOUNTER — Telehealth: Payer: Self-pay | Admitting: *Deleted

## 2013-12-03 MED ORDER — METOPROLOL SUCCINATE ER 25 MG PO TB24: 12.5000 mg | ORAL_TABLET | Freq: Every day | ORAL | Status: DC

## 2013-12-03 NOTE — Telephone Encounter (Signed)
From last note her bp was running low on last visit, was to decrease Toprol XL to 12.5mg  daily, which is the correct dose   Zandra Abts MD

## 2013-12-03 NOTE — Telephone Encounter (Signed)
Received fax to refill metoprolol 25 mg daily. LOV note says decrease to 12.5mg  daily. Will forward to Dr. Harl Bowie for correct dose

## 2013-12-03 NOTE — Addendum Note (Signed)
Addended by: Hilarie Fredrickson T on: 12/03/2013 10:31 AM   Modules accepted: Orders

## 2013-12-03 NOTE — Telephone Encounter (Signed)
Correct dose sent to pharmacy.

## 2014-04-09 ENCOUNTER — Encounter: Payer: Self-pay | Admitting: Cardiology

## 2014-04-09 ENCOUNTER — Ambulatory Visit (INDEPENDENT_AMBULATORY_CARE_PROVIDER_SITE_OTHER): Payer: Self-pay | Admitting: Cardiology

## 2014-04-09 VITALS — BP 98/62 | HR 62 | Ht 61.0 in | Wt 122.0 lb

## 2014-04-09 DIAGNOSIS — I2510 Atherosclerotic heart disease of native coronary artery without angina pectoris: Secondary | ICD-10-CM

## 2014-04-09 DIAGNOSIS — I714 Abdominal aortic aneurysm, without rupture, unspecified: Secondary | ICD-10-CM

## 2014-04-09 DIAGNOSIS — I10 Essential (primary) hypertension: Secondary | ICD-10-CM

## 2014-04-09 DIAGNOSIS — R002 Palpitations: Secondary | ICD-10-CM

## 2014-04-09 DIAGNOSIS — Z131 Encounter for screening for diabetes mellitus: Secondary | ICD-10-CM

## 2014-04-09 DIAGNOSIS — I6523 Occlusion and stenosis of bilateral carotid arteries: Secondary | ICD-10-CM

## 2014-04-09 DIAGNOSIS — E785 Hyperlipidemia, unspecified: Secondary | ICD-10-CM

## 2014-04-09 LAB — COMPREHENSIVE METABOLIC PANEL
ALT: 10 U/L (ref 0–35)
AST: 14 U/L (ref 0–37)
Albumin: 4.3 g/dL (ref 3.5–5.2)
Alkaline Phosphatase: 98 U/L (ref 39–117)
BILIRUBIN TOTAL: 0.3 mg/dL (ref 0.2–1.2)
BUN: 17 mg/dL (ref 6–23)
CO2: 24 mEq/L (ref 19–32)
Calcium: 9.1 mg/dL (ref 8.4–10.5)
Chloride: 106 mEq/L (ref 96–112)
Creat: 1.03 mg/dL (ref 0.50–1.10)
Glucose, Bld: 76 mg/dL (ref 70–99)
POTASSIUM: 4.9 meq/L (ref 3.5–5.3)
SODIUM: 140 meq/L (ref 135–145)
Total Protein: 7 g/dL (ref 6.0–8.3)

## 2014-04-09 LAB — TSH: TSH: 0.414 u[IU]/mL (ref 0.350–4.500)

## 2014-04-09 LAB — HEMOGLOBIN A1C
HEMOGLOBIN A1C: 5.5 % (ref <5.7)
MEAN PLASMA GLUCOSE: 111 mg/dL (ref <117)

## 2014-04-09 LAB — LIPID PANEL
CHOLESTEROL: 136 mg/dL (ref 0–200)
HDL: 50 mg/dL (ref >=46)
LDL Cholesterol: 49 mg/dL (ref 0–99)
TRIGLYCERIDES: 183 mg/dL — AB (ref <150)
Total CHOL/HDL Ratio: 2.7 Ratio
VLDL: 37 mg/dL (ref 0–40)

## 2014-04-09 LAB — MAGNESIUM: Magnesium: 2.5 mg/dL (ref 1.5–2.5)

## 2014-04-09 NOTE — Patient Instructions (Signed)
Your physician wants you to follow-up in: 6 months with Dr. Harl Bowie. You will receive a reminder letter in the mail two months in advance. If you don't receive a letter, please call our office to schedule the follow-up appointment.  Your physician recommends that you return for lab work in:  Today  Your physician has requested that you have a carotid duplex in July. This test is an ultrasound of the carotid arteries in your neck. It looks at blood flow through these arteries that supply the brain with blood. Allow one hour for this exam. There are no restrictions or special instructions.  Thank you for choosing [facility]!

## 2014-04-09 NOTE — Progress Notes (Signed)
Clinical Summary Kaitlin [name] is a 64 y.o.female seen today for follow up of the following medical problems.   1. CAD  - w/ DES to LAD and BMSx2 to RCA Jan 2007, reported normal LV function from notes.  - the RCA PCI was complicated by dissection which was also stented   - denies any significant chest pain. No SOB or Doe - compliant with meds.    2. PAD  - 60-70% right external iliac artery stenosis by previous cath  - denies any significant claudication pain   3. Carotid stenosis  - prior right carotid endarterectomy  - denies any vision changes, no slurred speech, no focal neuro deficits.  - repeat US [id number] LICA [id number], patent right CEA  4. HTN  - compliant w/ meds  - does have some dizziness at times, often with standing.   5. Hyperlipidemia  - compliant with statin   6. Tobacco abuse  - <1 ppd. States chantix did not help.  - has some nicotine patches at home but has not tried  7. AAA 07/2013 only small 2.2 cm by last [place] - denies any abdominal pain   Past Medical History  Diagnosis Date  . CAD (coronary artery disease)     stents  . Hypertension   . Hyperlipidemia   . Iliac artery stenosis, right     60%-70%  . PAD (peripheral artery disease) 12/14/2010    in the left vertebral, bilateral carotids  . GERD (gastroesophageal reflux disease)      No Known Allergies   Current Outpatient Prescriptions  Medication Sig Dispense Refill  . aspirin 81 MG tablet Take 81 mg by mouth daily.    . clopidogrel (PLAVIX) 75 MG tablet Take 1 tablet (75 mg total) by mouth daily. 90 tablet 3  . fish oil-omega-3 fatty acids 1000 MG capsule Take 1 g by mouth daily.    Marland Kitchen lisinopril (PRINIVIL,ZESTRIL) 5 MG tablet Take 0.5 tablets (2.5 mg total) by mouth daily. 45 tablet 3  . metoprolol succinate (TOPROL XL) 25 MG 24 hr tablet Take 0.5 tablets (12.5 mg total) by mouth daily. 45 tablet 11  . nitroGLYCERIN (NITROSTAT) 0.4 MG SL tablet Place 1 tablet (0.4 mg total)  under the tongue every 5 (five) minutes as needed for chest pain. 25 tablet 3  . rosuvastatin (CRESTOR) 40 MG tablet Take 1 tablet (40 mg total) by mouth daily. 90 tablet 3   No current facility-administered medications for this visit.     Past Surgical History  Procedure Laterality Date  . Cardiac catheterization  02/04/2005    A cypher 5.3 x 18 mm at 16 atmosphere of pressure in the mid LAD, this stent covered the proximal part of the LAD and also the mid LAD, this was then post dilated with 3.25 x 15 mm Quantum at 16 atmosphereof pressure for 45 seconds     No Known Allergies    No family history on file.   Social History Kaitlin [name] reports that she has been smoking Cigarettes.  She started smoking about 47 years ago. She has a 22.5 pack-year smoking history. She has quit using smokeless tobacco. Kaitlin [name] reports that she does not drink alcohol.   Review of Systems CONSTITUTIONAL: No weight loss, fever, chills, weakness or fatigue.  HEENT: Eyes: No visual loss, blurred vision, double vision or yellow sclerae.No hearing loss, sneezing, congestion, runny nose or sore throat.  SKIN: No rash or itching.  CARDIOVASCULAR: per HPI RESPIRATORY: No  shortness of breath, cough or sputum.  GASTROINTESTINAL: No anorexia, nausea, vomiting or diarrhea. No abdominal pain or blood.  GENITOURINARY: No burning on urination, no polyuria NEUROLOGICAL: occasional dizziness MUSCULOSKELETAL: No muscle, back pain, joint pain or stiffness.  LYMPHATICS: No enlarged nodes. No history of splenectomy.  PSYCHIATRIC: No history of depression or anxiety.  ENDOCRINOLOGIC: No reports of sweating, cold or heat intolerance. No polyuria or polydipsia.  Marland Kitchen   Physical Examination p 62 bp 98/62 Wt 122 lbs BMI 23 Gen: resting comfortably, no acute distress HEENT: no scleral icterus, pupils equal round and reactive, no palptable cervical adenopathy,  CV: RRR, no m/r/,g no JVD,  Resp: Clear to auscultation  bilaterally GI: abdomen is soft, non-tender, non-distended, normal bowel sounds, no hepatosplenomegaly MSK: extremities are warm, no edema.  Skin: warm, no rash Neuro:  no focal deficits Psych: appropriate affect     Assessment and Plan  1. CAD - no current symptoms - continue current meds  2. HTN - at goal, continue current meds  3. HL - continue high dose statin  4. Carotid stenosis - repeat carotid US 07/2014  5. PAD - no current symptoms, continue current meds  6. Tobacco - she is not ready to quit. Counseled on health associated risks with conitnued smoking and advised to quit  7. AAA - repeat US    Arnoldo Lenis, M.D.

## 2014-06-01 ENCOUNTER — Other Ambulatory Visit: Payer: Self-pay | Admitting: Cardiology

## 2014-06-01 DIAGNOSIS — I6523 Occlusion and stenosis of bilateral carotid arteries: Secondary | ICD-10-CM

## 2014-08-11 ENCOUNTER — Other Ambulatory Visit: Payer: 59

## 2014-08-12 ENCOUNTER — Ambulatory Visit (INDEPENDENT_AMBULATORY_CARE_PROVIDER_SITE_OTHER): Payer: 59

## 2014-08-12 DIAGNOSIS — I6523 Occlusion and stenosis of bilateral carotid arteries: Secondary | ICD-10-CM | POA: Diagnosis not present

## 2014-08-12 DIAGNOSIS — I714 Abdominal aortic aneurysm, without rupture, unspecified: Secondary | ICD-10-CM

## 2014-11-07 ENCOUNTER — Other Ambulatory Visit: Payer: Self-pay | Admitting: Cardiology

## 2014-12-03 ENCOUNTER — Other Ambulatory Visit: Payer: Self-pay | Admitting: Cardiology

## 2015-01-04 ENCOUNTER — Other Ambulatory Visit: Payer: Self-pay | Admitting: Cardiology

## 2015-01-17 ENCOUNTER — Ambulatory Visit (INDEPENDENT_AMBULATORY_CARE_PROVIDER_SITE_OTHER): Payer: 59 | Admitting: Cardiology

## 2015-01-17 ENCOUNTER — Encounter: Payer: Self-pay | Admitting: Cardiology

## 2015-01-17 VITALS — BP 112/64 | HR 95 | Ht 61.0 in | Wt 122.0 lb

## 2015-01-17 DIAGNOSIS — I714 Abdominal aortic aneurysm, without rupture, unspecified: Secondary | ICD-10-CM

## 2015-01-17 DIAGNOSIS — E782 Mixed hyperlipidemia: Secondary | ICD-10-CM

## 2015-01-17 DIAGNOSIS — I6523 Occlusion and stenosis of bilateral carotid arteries: Secondary | ICD-10-CM | POA: Diagnosis not present

## 2015-01-17 DIAGNOSIS — I2510 Atherosclerotic heart disease of native coronary artery without angina pectoris: Secondary | ICD-10-CM | POA: Diagnosis not present

## 2015-01-17 DIAGNOSIS — I10 Essential (primary) hypertension: Secondary | ICD-10-CM | POA: Diagnosis not present

## 2015-01-17 DIAGNOSIS — I739 Peripheral vascular disease, unspecified: Secondary | ICD-10-CM

## 2015-01-17 NOTE — Progress Notes (Signed)
Patient ID: Kaitlin [name], female   DOB: 1950-11-02, 64 y.o.   MRN: [id number]     Clinical Summary Ms. Spinney is a 64 y.o.female seen today for follow up of the following medical problems.   1. CAD  - w/ DES to LAD and BMSx2 to RCA Jan 2007, reported normal LV function from notes.  - the RCA PCI was complicated by dissection which was also stented  - she denies any significant chest pain. No SOB or DOE  2. PAD  - 60-70% right external iliac artery stenosis by previous cath  - denies any  claudication pain. No foot sores   3. Carotid stenosis  - prior right carotid endarterectomy   - repeat US 06/2014 moderate bilateral disease with patent CEA site - no recent neuro symptoms  4. HTN  - compliant w/ meds   5. Hyperlipidemia  - compliant with statin  - 03/2014 TC 136 TG 183 HDL 50 LDL 49   6. Tobacco abuse  - note ready to quit   7. AAA 07/2014 small AAA 3 x 3.2 cm.  - denies any abdominal pain Past Medical History  Diagnosis Date  . CAD (coronary artery disease)     stents  . Hypertension   . Hyperlipidemia   . Iliac artery stenosis, right     60%-70%  . PAD (peripheral artery disease) 12/14/2010    in the left vertebral, bilateral carotids  . GERD (gastroesophageal reflux disease)      No Known Allergies   Current Outpatient Prescriptions  Medication Sig Dispense Refill  . aspirin 81 MG tablet Take 81 mg by mouth daily.    . clopidogrel (PLAVIX) 75 MG tablet TAKE ONE (1) TABLET BY MOUTH EVERY DAY 90 tablet 3  . fish oil-omega-3 fatty acids 1000 MG capsule Take 1 g by mouth daily.    Marland Kitchen lisinopril (PRINIVIL,ZESTRIL) 5 MG tablet TAKE ONE-HALF TABLET BY MOUTH ONCE A DAY 45 tablet 1  . metoprolol succinate (TOPROL-XL) 25 MG 24 hr tablet TAKE 1/2 TABLET DAILY 45 tablet 1  . nitroGLYCERIN (NITROSTAT) 0.4 MG SL tablet Place 1 tablet (0.4 mg total) under the tongue every 5 (five) minutes as needed for chest pain. 25 tablet 3  . rosuvastatin (CRESTOR) 40  MG tablet Take 1 tablet (40 mg total) by mouth daily. 90 tablet 3   No current facility-administered medications for this visit.     Past Surgical History  Procedure Laterality Date  . Cardiac catheterization  02/04/2005    A cypher 5.3 x 18 mm at 16 atmosphere of pressure in the mid LAD, this stent covered the proximal part of the LAD and also the mid LAD, this was then post dilated with 3.25 x 15 mm Quantum at 16 atmosphereof pressure for 45 seconds     No Known Allergies    No family history on file.   Social History Ms. Ojala reports that she has been smoking Cigarettes.  She started smoking about 47 years ago. She has a 22.5 pack-year smoking history. She has quit using smokeless tobacco. Ms. Zepeda reports that she does not drink alcohol.   Review of Systems CONSTITUTIONAL: No weight loss, fever, chills, weakness or fatigue.  HEENT: Eyes: No visual loss, blurred vision, double vision or yellow sclerae.No hearing loss, sneezing, congestion, runny nose or sore throat.  SKIN: No rash or itching.  CARDIOVASCULAR: per HPI RESPIRATORY: No shortness of breath, cough or sputum.  GASTROINTESTINAL: No anorexia, nausea, vomiting or diarrhea. No  abdominal pain or blood.  GENITOURINARY: No burning on urination, no polyuria NEUROLOGICAL: No headache, dizziness, syncope, paralysis, ataxia, numbness or tingling in the extremities. No change in bowel or bladder control.  MUSCULOSKELETAL: No muscle, back pain, joint pain or stiffness.  LYMPHATICS: No enlarged nodes. No history of splenectomy.  PSYCHIATRIC: No history of depression or anxiety.  ENDOCRINOLOGIC: No reports of sweating, cold or heat intolerance. No polyuria or polydipsia.  Marland Kitchen   Physical Examination Filed Vitals:   01/17/15 1509  BP: 112/64  Pulse: 95   Filed Vitals:   01/17/15 1509  Height: 5\' 1"  (1.549 m)  Weight: 122 lb (55.339 kg)    Gen: resting comfortably, no acute distress HEENT: no scleral icterus,  pupils equal round and reactive, no palptable cervical adenopathy,  CV: RRR, no m/r/g, no jvd. Right carotid bruit Resp: Clear to auscultation bilaterally GI: abdomen is soft, non-tender, non-distended, normal bowel sounds, no hepatosplenomegaly MSK: extremities are warm, no edema.  Skin: warm, no rash Neuro:  no focal deficits Psych: appropriate affect   Diagnostic Studies  07/2014 Carotid US - 40-59% bilateral diseae, right CEA is patent.   07/2014 AAA [place] 3 x 3.2 aneurysm   Assessment and Plan   1. CAD - no current symptoms - continue secondary prevention and risk factor modficiation.   2. HTN - at goal, continue current meds  3. HL - continue high dose statin in setting of CAD and PAD  4. Carotid stenosis - moderate bilateral disease - we will repeat carotid US 07/2015  5. PAD - no current symptoms, continue current meds  6. Tobacco - recommeneded to quit, she is not read  7. AAA - mild by most recent US - repeat US 07/2015   F/u 6 months  Arnoldo Lenis, M.D

## 2015-01-17 NOTE — Patient Instructions (Signed)
Medication Instructions:  Your physician recommends that you continue on your current medications as directed. Please refer to the Current Medication list given to you today.   Labwork: WE WILL SEND A COPY OF MOST RECENT LAB WORK TO DR. GOLDING'S OFFICE  Testing/Procedures: NONE  Follow-Up: Your physician wants you to follow-up in: [place] DR. BRANCH.  You will receive a reminder letter in the mail two months in advance. If you don't receive a letter, please call our office to schedule the follow-up appointment.  Any Other Special Instructions Will Be Listed Below (If Applicable).     If you need a refill on your cardiac medications before your next appointment, please call your pharmacy.

## 2015-07-21 ENCOUNTER — Encounter: Payer: Self-pay | Admitting: Cardiology

## 2015-07-21 ENCOUNTER — Ambulatory Visit (INDEPENDENT_AMBULATORY_CARE_PROVIDER_SITE_OTHER): Payer: BLUE CROSS/BLUE SHIELD | Admitting: Cardiology

## 2015-07-21 VITALS — Ht 61.0 in | Wt 116.0 lb

## 2015-07-21 DIAGNOSIS — I10 Essential (primary) hypertension: Secondary | ICD-10-CM | POA: Diagnosis not present

## 2015-07-21 DIAGNOSIS — I714 Abdominal aortic aneurysm, without rupture, unspecified: Secondary | ICD-10-CM

## 2015-07-21 DIAGNOSIS — I2510 Atherosclerotic heart disease of native coronary artery without angina pectoris: Secondary | ICD-10-CM | POA: Diagnosis not present

## 2015-07-21 DIAGNOSIS — I6523 Occlusion and stenosis of bilateral carotid arteries: Secondary | ICD-10-CM

## 2015-07-21 DIAGNOSIS — Z9889 Other specified postprocedural states: Secondary | ICD-10-CM

## 2015-07-21 DIAGNOSIS — E785 Hyperlipidemia, unspecified: Secondary | ICD-10-CM

## 2015-07-21 NOTE — Patient Instructions (Addendum)
Medication Instructions:  Your physician recommends that you continue on your current medications as directed. Please refer to the Current Medication list given to you today.   Labwork: I WILL REQUEST LABS FROM PCP  Testing/Procedures: Your physician has requested that you have a carotid duplex. This test is an ultrasound of the carotid arteries in your neck. It looks at blood flow through these arteries that supply the brain with blood. Allow one hour for this exam. There are no restrictions or special instructions.    Your physician has requested that you have an abdominal aorta duplex. During this test, an ultrasound is used to evaluate the aorta. Allow 30 minutes for this exam. Do not eat after midnight the day before and avoid carbonated beverages     Follow-Up: Your physician wants you to follow-up in: 1 YEAR.  You will receive a reminder letter in the mail two months in advance. If you don't receive a letter, please call our office to schedule the follow-up appointment.   Any Other Special Instructions Will Be Listed Below (If Applicable).     If you need a refill on your cardiac medications before your next appointment, please call your pharmacy.    Your

## 2015-07-21 NOTE — Progress Notes (Addendum)
Clinical Summary [name] is a 65 y.o.female seen today for follow up of the following medical problems.   1. CAD  - w/ DES to LAD and BMSx2 to RCA Jan 2007, reported normal LV function from notes.  - the RCA PCI was complicated by dissection which was also stented  - she denies any significant chest pain. No SOB or DOE  - no recent chest pain. No SOB or DOE.    2. PAD  - 60-70% right external iliac artery stenosis by previous cath   - denies anyclaudication pain in legs. No foot sores   3. Carotid stenosis  - prior right carotid endarterectomy   - repeat US 06/2014 moderate bilateral disease with patent CEA site - no recent neuro symptoms  4. HTN  - compliant w/ meds  - does not check at home regularly    5. Hyperlipidemia  - compliant with statin  - 03/2014 TC 136 TG 183 HDL 50 LDL 49   6. Tobacco abuse  - note ready to quit, she is not interested in NRT   7. AAA 07/2014 small AAA 3 x 3.2 cm.  - denies any abdominal pain    SH: just retired from [employer]. Spends most of time with great grandchildren (9 months, 2.5 years) Past Medical History  Diagnosis Date  . CAD (coronary artery disease)     stents  . Hypertension   . Hyperlipidemia   . Iliac artery stenosis, right (HCC)     60%-70%  . PAD (peripheral artery disease) ([place]) 12/14/2010    in the left vertebral, bilateral carotids  . GERD (gastroesophageal reflux disease)      No Known Allergies   Current Outpatient Prescriptions  Medication Sig Dispense Refill  . aspirin 81 MG tablet Take 81 mg by mouth daily.    . clopidogrel (PLAVIX) 75 MG tablet TAKE ONE (1) TABLET BY MOUTH EVERY DAY 90 tablet 3  . fish oil-omega-3 fatty acids 1000 MG capsule Take 1 g by mouth daily.    Marland Kitchen lisinopril (PRINIVIL,ZESTRIL) 5 MG tablet TAKE ONE-HALF TABLET BY MOUTH ONCE A DAY 45 tablet 1  . metoprolol succinate (TOPROL-XL) 25 MG 24 hr tablet TAKE 1/2 TABLET DAILY 45 tablet 1  . nitroGLYCERIN  (NITROSTAT) 0.4 MG SL tablet Place 1 tablet (0.4 mg total) under the tongue every 5 (five) minutes as needed for chest pain. 25 tablet 3  . rosuvastatin (CRESTOR) 40 MG tablet Take 1 tablet (40 mg total) by mouth daily. 90 tablet 3   No current facility-administered medications for this visit.     Past Surgical History  Procedure Laterality Date  . Cardiac catheterization  02/04/2005    A cypher 5.3 x 18 mm at 16 atmosphere of pressure in the mid LAD, this stent covered the proximal part of the LAD and also the mid LAD, this was then post dilated with 3.25 x 15 mm Quantum at 16 atmosphereof pressure for 45 seconds     No Known Allergies    No family history on file.   Social History Kaitlin [name] reports that she has been smoking Cigarettes.  She started smoking about 48 years ago. She has a 22.5 pack-year smoking history. She has quit using smokeless tobacco. Kaitlin [name] reports that she does not drink alcohol.   Review of Systems CONSTITUTIONAL: No weight loss, fever, chills, weakness or fatigue.  HEENT: Eyes: No visual loss, blurred vision, double vision or yellow sclerae.No hearing loss, sneezing, congestion,  runny nose or sore throat.  SKIN: No rash or itching.  CARDIOVASCULAR: per HPI RESPIRATORY: No shortness of breath, cough or sputum.  GASTROINTESTINAL: No anorexia, nausea, vomiting or diarrhea. No abdominal pain or blood.  GENITOURINARY: No burning on urination, no polyuria NEUROLOGICAL: No headache, dizziness, syncope, paralysis, ataxia, numbness or tingling in the extremities. No change in bowel or bladder control.  MUSCULOSKELETAL: No muscle, back pain, joint pain or stiffness.  LYMPHATICS: No enlarged nodes. No history of splenectomy.  PSYCHIATRIC: No history of depression or anxiety.  ENDOCRINOLOGIC: No reports of sweating, cold or heat intolerance. No polyuria or polydipsia.  Marland Kitchen   Physical Examination There were no vitals filed for this visit. Filed Vitals:    07/21/15 1046  Height: 5\' 1"  (1.549 m)  Weight: 116 lb (52.617 kg)    Gen: resting comfortably, no acute distress HEENT: no scleral icterus, pupils equal round and reactive, no palptable cervical adenopathy,  CV: RRR, no m/r/g, no jvd Resp: Clear to auscultation bilaterally GI: abdomen is soft, non-tender, non-distended, normal bowel sounds, no hepatosplenomegaly MSK: extremities are warm, no edema.  Skin: warm, no rash Neuro:  no focal deficits Psych: appropriate affect   Diagnostic Studies 07/2014 Carotid US - 40-59% bilateral diseae, right CEA is patent.   07/2014 AAA [place] 3 x 3.2 aneurysm     Assessment and Plan  1. CAD - no current symptoms - we will continue current meds   2. HTN - she is at goal, continue current meds  3. HL - continue high dose statin in setting of CAD and PAD  4. Carotid stenosis - moderate bilateral disease - we will repeat carotid US  5. PAD - no current symptoms, continue to monitor  6. Tobacco - recommeneded to quit, she is not ready at this time  7. AAA - we will repeat AAA [place]   F/u 1 year. Request pcp labs      Arnoldo Lenis, M.D.

## 2015-07-29 ENCOUNTER — Ambulatory Visit (HOSPITAL_COMMUNITY)
Admission: RE | Admit: 2015-07-29 | Discharge: 2015-07-29 | Disposition: A | Discharge status: Home / Self Care | Payer: BLUE CROSS/BLUE SHIELD | Source: Ambulatory Visit | Attending: Cardiology | Admitting: Cardiology

## 2015-07-29 ENCOUNTER — Inpatient Hospital Stay (HOSPITAL_COMMUNITY): Admission: RE | Admit: 2015-07-29 | Payer: Self-pay | Source: Ambulatory Visit

## 2015-07-29 DIAGNOSIS — I714 Abdominal aortic aneurysm, without rupture: Secondary | ICD-10-CM | POA: Insufficient documentation

## 2015-07-29 DIAGNOSIS — I6523 Occlusion and stenosis of bilateral carotid arteries: Secondary | ICD-10-CM | POA: Diagnosis not present

## 2015-07-29 DIAGNOSIS — Z9889 Other specified postprocedural states: Secondary | ICD-10-CM | POA: Diagnosis not present

## 2015-08-03 ENCOUNTER — Telehealth: Payer: Self-pay

## 2015-08-03 NOTE — Telephone Encounter (Signed)
lmtcb- 7/5- lm

## 2015-08-03 NOTE — Telephone Encounter (Signed)
-----   Message from Arnoldo Lenis, MD sent at 08/03/2015 12:23 PM EDT ----- Carotid US shows mild to moderate blockages, we will continue to monitor  J BrancH MD

## 2015-08-03 NOTE — Telephone Encounter (Signed)
-----   Message from Arnoldo Lenis, MD sent at 08/03/2015 12:22 PM EDT ----- Abd US shows stable aneurysm, we will continue to monitor  Zandra Abts MD

## 2015-08-03 NOTE — Telephone Encounter (Signed)
lmtcb - 08/03/15

## 2015-09-07 ENCOUNTER — Other Ambulatory Visit (HOSPITAL_COMMUNITY)
Admission: RE | Admit: 2015-09-07 | Discharge: 2015-09-07 | Disposition: A | Discharge status: Home / Self Care | Payer: BLUE CROSS/BLUE SHIELD | Source: Other Acute Inpatient Hospital | Attending: Urology | Admitting: Urology

## 2015-09-07 ENCOUNTER — Ambulatory Visit (INDEPENDENT_AMBULATORY_CARE_PROVIDER_SITE_OTHER): Payer: BLUE CROSS/BLUE SHIELD | Admitting: Urology

## 2015-09-07 DIAGNOSIS — R319 Hematuria, unspecified: Secondary | ICD-10-CM | POA: Diagnosis present

## 2015-09-07 DIAGNOSIS — R3129 Other microscopic hematuria: Secondary | ICD-10-CM | POA: Diagnosis not present

## 2015-09-07 LAB — URINALYSIS, ROUTINE W REFLEX MICROSCOPIC
Bilirubin Urine: NEGATIVE
GLUCOSE, UA: NEGATIVE mg/dL
KETONES UR: NEGATIVE mg/dL
LEUKOCYTES UA: NEGATIVE
Nitrite: NEGATIVE
PROTEIN: 30 mg/dL — AB
Specific Gravity, Urine: 1.025 (ref 1.005–1.030)
pH: 5.5 (ref 5.0–8.0)

## 2015-09-07 LAB — URINE MICROSCOPIC-ADD ON: WBC, UA: NONE SEEN WBC/hpf (ref 0–5)

## 2015-09-16 ENCOUNTER — Other Ambulatory Visit: Payer: Self-pay | Admitting: Urology

## 2015-09-16 DIAGNOSIS — R3129 Other microscopic hematuria: Secondary | ICD-10-CM

## 2015-09-26 ENCOUNTER — Ambulatory Visit (HOSPITAL_COMMUNITY)
Admission: RE | Admit: 2015-09-26 | Discharge: 2015-09-26 | Disposition: A | Discharge status: Home / Self Care | Payer: BLUE CROSS/BLUE SHIELD | Source: Ambulatory Visit | Attending: Urology | Admitting: Urology

## 2015-09-26 DIAGNOSIS — N8110 Cystocele, unspecified: Secondary | ICD-10-CM | POA: Diagnosis not present

## 2015-09-26 DIAGNOSIS — R3129 Other microscopic hematuria: Secondary | ICD-10-CM | POA: Insufficient documentation

## 2015-09-26 DIAGNOSIS — I700 Atherosclerosis of aorta: Secondary | ICD-10-CM | POA: Diagnosis not present

## 2015-09-26 DIAGNOSIS — K8020 Calculus of gallbladder without cholecystitis without obstruction: Secondary | ICD-10-CM | POA: Diagnosis not present

## 2015-09-26 DIAGNOSIS — M461 Sacroiliitis, not elsewhere classified: Secondary | ICD-10-CM | POA: Diagnosis not present

## 2015-09-26 DIAGNOSIS — M5136 Other intervertebral disc degeneration, lumbar region: Secondary | ICD-10-CM | POA: Insufficient documentation

## 2015-09-26 DIAGNOSIS — K5730 Diverticulosis of large intestine without perforation or abscess without bleeding: Secondary | ICD-10-CM | POA: Diagnosis not present

## 2015-09-26 MED ORDER — IOPAMIDOL (ISOVUE-300) INJECTION 61%
100.0000 mL | Freq: Once | INTRAVENOUS | Status: AC | PRN
  Administered 2015-09-26: 100 mL via INTRAVENOUS

## 2015-09-28 ENCOUNTER — Ambulatory Visit (INDEPENDENT_AMBULATORY_CARE_PROVIDER_SITE_OTHER): Payer: BLUE CROSS/BLUE SHIELD | Admitting: Urology

## 2015-09-28 DIAGNOSIS — R3129 Other microscopic hematuria: Secondary | ICD-10-CM | POA: Diagnosis not present

## 2015-12-26 ENCOUNTER — Other Ambulatory Visit: Payer: Self-pay | Admitting: Cardiology

## 2016-05-29 ENCOUNTER — Telehealth: Payer: Self-pay | Admitting: Cardiology

## 2016-05-29 ENCOUNTER — Other Ambulatory Visit: Payer: Self-pay

## 2016-05-29 DIAGNOSIS — R0989 Other specified symptoms and signs involving the circulatory and respiratory systems: Secondary | ICD-10-CM

## 2016-05-29 NOTE — Telephone Encounter (Signed)
Order placed for [facility] office.

## 2016-05-29 NOTE — Telephone Encounter (Signed)
Pt is due for a carotid US, needing the order placed so I can schedule. Would like to schedule in [place] if possible

## 2016-06-01 ENCOUNTER — Other Ambulatory Visit: Payer: Self-pay

## 2016-06-01 DIAGNOSIS — R0989 Other specified symptoms and signs involving the circulatory and respiratory systems: Secondary | ICD-10-CM

## 2016-06-01 NOTE — Telephone Encounter (Signed)
Cartoid dopplers have been scheduled for 06/08/16 at [facility]. Patient wanted to know if we would be scheduling the AAA also.  Please call patient [phone] to discuss with patient.

## 2016-06-01 NOTE — Telephone Encounter (Signed)
Dr Harl Bowie reviewed [place] AAA, ,not due for 3 yrs (2020)  Pt aware

## 2016-06-07 ENCOUNTER — Ambulatory Visit (HOSPITAL_COMMUNITY): Payer: BLUE CROSS/BLUE SHIELD

## 2016-06-08 ENCOUNTER — Ambulatory Visit (HOSPITAL_COMMUNITY)
Admission: RE | Admit: 2016-06-08 | Discharge: 2016-06-08 | Disposition: A | Discharge status: Home / Self Care | Payer: BLUE CROSS/BLUE SHIELD | Source: Ambulatory Visit | Attending: Cardiology | Admitting: Cardiology

## 2016-06-08 DIAGNOSIS — R0989 Other specified symptoms and signs involving the circulatory and respiratory systems: Secondary | ICD-10-CM | POA: Diagnosis present

## 2016-06-08 DIAGNOSIS — I6523 Occlusion and stenosis of bilateral carotid arteries: Secondary | ICD-10-CM | POA: Insufficient documentation

## 2016-07-31 ENCOUNTER — Encounter: Payer: Self-pay | Admitting: Cardiology

## 2016-07-31 ENCOUNTER — Ambulatory Visit (INDEPENDENT_AMBULATORY_CARE_PROVIDER_SITE_OTHER): Payer: BLUE CROSS/BLUE SHIELD | Admitting: Cardiology

## 2016-07-31 ENCOUNTER — Ambulatory Visit: Payer: BLUE CROSS/BLUE SHIELD | Admitting: Cardiology

## 2016-07-31 VITALS — BP 106/62 | HR 98 | Ht 61.0 in | Wt 125.0 lb

## 2016-07-31 DIAGNOSIS — I2510 Atherosclerotic heart disease of native coronary artery without angina pectoris: Secondary | ICD-10-CM

## 2016-07-31 DIAGNOSIS — I6523 Occlusion and stenosis of bilateral carotid arteries: Secondary | ICD-10-CM

## 2016-07-31 DIAGNOSIS — E782 Mixed hyperlipidemia: Secondary | ICD-10-CM | POA: Diagnosis not present

## 2016-07-31 DIAGNOSIS — Z9889 Other specified postprocedural states: Secondary | ICD-10-CM | POA: Diagnosis not present

## 2016-07-31 DIAGNOSIS — I10 Essential (primary) hypertension: Secondary | ICD-10-CM | POA: Diagnosis not present

## 2016-07-31 NOTE — Patient Instructions (Signed)
Your physician wants you to follow-up in: 1 year with Dr Bryna Colander will receive a reminder letter in the mail two months in advance. If you don't receive a letter, please call our office to schedule the follow-up appointment.    Your physician recommends that you continue on your current medications as directed. Please refer to the Current Medication list given to you today.    If you need a refill on your cardiac medications before your next appointment, please call your pharmacy.    No testing or lab work today.      Thank you for choosing [facility] !

## 2016-07-31 NOTE — Progress Notes (Signed)
Clinical Summary Kaitlin [name] is a 66 y.o.female seen today for follow up of the following medical problems.   1. CAD  - w/ DES to LAD and BMSx2 to RCA Jan 2007, reported normal LV function from notes.  - the RCA PCI was complicated by dissection which was also stented    - no recent chest pain. No SOB or DOE - compliant with meds  2. PAD  - 60-70% right external iliac artery stenosis by previous cath   - denies anyclaudication pain in legs. No foot sores   3. Carotid stenosis  - prior right carotid endarterectomy   - repeat US 06/2014 moderate bilateral disease with patent CEA site - no recent neuro symptoms  - 09/3808 carotid US: LICA 17-51%, RICA <02%.    4. HTN  - compliant w/ meds     5. Hyperlipidemia  - compliant with statin   - reports recent labs with pcp  6. AAA 07/2014 small AAA 3 x 3.2 cm.      SH: just retired from [employer] job. Spends most of time with great grandchildren (9 months, 2.5 years)   Past Medical History:  Diagnosis Date  . CAD (coronary artery disease)    stents  . GERD (gastroesophageal reflux disease)   . Hyperlipidemia   . Hypertension   . Iliac artery stenosis, right (HCC)    60%-70%  . PAD (peripheral artery disease) ([place]) 12/14/2010   in the left vertebral, bilateral carotids     No Known Allergies   Current Outpatient Prescriptions  Medication Sig Dispense Refill  . aspirin 81 MG tablet Take 81 mg by mouth daily.    . clopidogrel (PLAVIX) 75 MG tablet TAKE ONE (1) TABLET BY MOUTH EVERY DAY 90 tablet 1  . fish oil-omega-3 fatty acids 1000 MG capsule Take 1 g by mouth daily.    Marland Kitchen lisinopril (PRINIVIL,ZESTRIL) 5 MG tablet TAKE ONE-HALF TABLET BY MOUTH ONCE A DAY 45 tablet 1  . metoprolol succinate (TOPROL-XL) 25 MG 24 hr tablet TAKE 1/2 TABLET DAILY 45 tablet 1  . nitroGLYCERIN (NITROSTAT) 0.4 MG SL tablet Place 1 tablet (0.4 mg total) under the tongue every 5 (five) minutes as  needed for chest pain. 25 tablet 3  . rosuvastatin (CRESTOR) 40 MG tablet TAKE ONE (1) TABLET BY MOUTH EVERY DAY 90 tablet 1   No current facility-administered medications for this visit.      Past Surgical History:  Procedure Laterality Date  . CARDIAC CATHETERIZATION  02/04/2005   A cypher 5.3 x 18 mm at 16 atmosphere of pressure in the mid LAD, this stent covered the proximal part of the LAD and also the mid LAD, this was then post dilated with 3.25 x 15 mm Quantum at 16 atmosphereof pressure for 45 seconds     No Known Allergies    No family history on file.   Social History Ms. Zarcone reports that she has been smoking Cigarettes.  She started smoking about 49 years ago. She has a 22.50 pack-year smoking history. She has quit using smokeless tobacco. Ms. Clauson reports that she does not drink alcohol.   Review of Systems CONSTITUTIONAL: No weight loss, fever, chills, weakness or fatigue.  HEENT: Eyes: No visual loss, blurred vision, double vision or yellow sclerae.No hearing loss, sneezing, congestion, runny nose or sore throat.  SKIN: No rash or itching.  CARDIOVASCULAR: per hpi RESPIRATORY: No shortness of breath, cough or sputum.  GASTROINTESTINAL: No anorexia, nausea, vomiting or  diarrhea. No abdominal pain or blood.  GENITOURINARY: No burning on urination, no polyuria NEUROLOGICAL: No headache, dizziness, syncope, paralysis, ataxia, numbness or tingling in the extremities. No change in bowel or bladder control.  MUSCULOSKELETAL: No muscle, back pain, joint pain or stiffness.  LYMPHATICS: No enlarged nodes. No history of splenectomy.  PSYCHIATRIC: No history of depression or anxiety.  ENDOCRINOLOGIC: No reports of sweating, cold or heat intolerance. No polyuria or polydipsia.  Marland Kitchen   Physical Examination Vitals:   07/31/16 1337  BP: 106/62  Pulse: 98   Vitals:   07/31/16 1337  Weight: 125 lb (56.7 kg)  Height: 5\' 1"  (1.549 m)    Gen: resting comfortably,  no acute distress HEENT: no scleral icterus, pupils equal round and reactive, no palptable cervical adenopathy,  CV: RRR, no m/r/g, no jvd Resp: Clear to auscultation bilaterally GI: abdomen is soft, non-tender, non-distended, normal bowel sounds, no hepatosplenomegaly MSK: extremities are warm, no edema.  Skin: warm, no rash Neuro:  no focal deficits Psych: appropriate affect   Diagnostic Studies 07/2014 Carotid US - 40-59% bilateral diseae, right CEA is patent.   07/2014 AAA [place] 3 x 3.2 aneurysm  05/2016 Carotid US IMPRESSION: Less than 50% stenosis in the right internal carotid artery.  50-69% stenosis in the left internal carotid artery.  Assessment and Plan  1. CAD - no symptoms - continue current meds   2. HTN - at goal, continue current meds  3. HL - continue high dose statin, request labs from pcp  4. Carotid stenosis - moderate bilateral disease - cointinue to monitor  5. PAD - no symptoms, continue to monitor   6. AAA -continue to monitor  F/u 1 year.      Arnoldo Lenis, M.D.

## 2016-10-25 ENCOUNTER — Ambulatory Visit (INDEPENDENT_AMBULATORY_CARE_PROVIDER_SITE_OTHER): Payer: Medicare Other | Admitting: Family Medicine

## 2016-10-25 ENCOUNTER — Encounter: Payer: Self-pay | Admitting: Family Medicine

## 2016-10-25 VITALS — BP 118/70 | HR 98 | Ht 61.0 in | Wt 128.0 lb

## 2016-10-25 DIAGNOSIS — Z122 Encounter for screening for malignant neoplasm of respiratory organs: Secondary | ICD-10-CM

## 2016-10-25 DIAGNOSIS — F17210 Nicotine dependence, cigarettes, uncomplicated: Secondary | ICD-10-CM | POA: Diagnosis not present

## 2016-10-25 DIAGNOSIS — Z23 Encounter for immunization: Secondary | ICD-10-CM | POA: Diagnosis not present

## 2016-10-25 DIAGNOSIS — I10 Essential (primary) hypertension: Secondary | ICD-10-CM

## 2016-10-25 DIAGNOSIS — F17200 Nicotine dependence, unspecified, uncomplicated: Secondary | ICD-10-CM | POA: Insufficient documentation

## 2016-10-25 DIAGNOSIS — C679 Malignant neoplasm of bladder, unspecified: Secondary | ICD-10-CM | POA: Diagnosis not present

## 2016-10-25 DIAGNOSIS — E785 Hyperlipidemia, unspecified: Secondary | ICD-10-CM

## 2016-10-25 NOTE — Patient Instructions (Signed)
Try to cut down on the smoking I will order the lung scan Stay as active as you can manage We will Call [facility] to make appointment with Dr Huel Cote for follow up  See me in one month for a Physical Need records [place]

## 2016-10-25 NOTE — Progress Notes (Signed)
Chief Complaint  Patient presents with  . Bladder Cancer   Pleasant new patient Tells me how "healthy" she is In truth she has ASCVD with coronary artery disease/stents, PAD with surgery AND bladder cancer diagnosed many months ago but never went back for treatment. She is not on a heart healthy diet She does not get regular exercise She is not up to date with any health screenings and refuses pap, colo, mammogram I have discussed the multiple health risks associated with cigarette smoking including, but not limited to, cardiovascular disease, lung disease and cancer.  I have strongly recommended that smoking be stopped.  I have reviewed the various methods of quitting including cold [place], classes, nicotine replacements and prescription medications.  I have offered assistance in this difficult process.  The patient is not interested in assistance at this time.. I spent time explaining that she already had vascular disease and cancer, and likely  Had some degree of COPD.  She will try to cut down.  Will not commit to quitting. Declines Rx chantix.   Patient Active Problem List   Diagnosis Date Noted  . Nicotine dependence 10/25/2016  . CAD (coronary artery disease) of artery bypass graft 10/17/2012  . PAD (peripheral artery disease) ([place]) 10/17/2012  . Carotid stenosis 10/17/2012  . HTN (hypertension) 10/17/2012  . Hyperlipidemia 10/17/2012  . Tobacco abuse 10/17/2012    Outpatient Encounter Prescriptions as of 10/25/2016  Medication Sig  . aspirin 81 MG tablet Take 81 mg by mouth daily.  . clopidogrel (PLAVIX) 75 MG tablet TAKE ONE (1) TABLET BY MOUTH EVERY DAY  . fish oil-omega-3 fatty acids 1000 MG capsule Take 1 g by mouth daily.  Marland Kitchen lisinopril (PRINIVIL,ZESTRIL) 5 MG tablet TAKE ONE-HALF TABLET BY MOUTH ONCE A DAY  . metoprolol succinate (TOPROL-XL) 25 MG 24 hr tablet TAKE 1/2 TABLET DAILY  . nitroGLYCERIN (NITROSTAT) 0.4 MG SL tablet Place 1 tablet (0.4 mg total) under the  tongue every 5 (five) minutes as needed for chest pain.  . rosuvastatin (CRESTOR) 40 MG tablet TAKE ONE (1) TABLET BY MOUTH EVERY DAY   No facility-administered encounter medications on file as of 10/25/2016.     Past Medical History:  Diagnosis Date  . Arthritis   . CAD (coronary artery disease)    stents  . COPD (chronic obstructive pulmonary disease) ([place])   . Emphysema of lung ([place])   . GERD (gastroesophageal reflux disease)   . Hyperlipidemia   . Hypertension   . Iliac artery stenosis, right (H[facility])    60%-70%  . Myocardial infarction ([place])   . PAD (peripheral artery disease) ([place]) 12/14/2010   in the left vertebral, bilateral carotids    Past Surgical History:  Procedure Laterality Date  . CARDIAC CATHETERIZATION  02/04/2005   A cypher 5.3 x 18 mm at 16 atmosphere of pressure in the mid LAD, this stent covered the proximal part of the LAD and also the mid LAD, this was then post dilated with 3.25 x 15 mm Quantum at 16 atmosphereof pressure for 45 seconds  . TUBAL LIGATION      Social History   Social History  . Marital status: Married    Spouse name: Marcello Moores  . Number of children: 3  . Years of education: 14   Occupational History  . RETIRED     warehouse/textiles   Social History Main Topics  . Smoking status: Current Every Day Smoker    Packs/day: 1.00    Years: 49.00  Types: Cigarettes    Start date: 02/09/1967  . Smokeless tobacco: Former Systems developer  . Alcohol use No  . Drug use: No  . Sexual activity: Yes    Birth control/ protection: Post-menopausal   Other Topics Concern  . Not on file   Social History Narrative   Retired   Has an [employer] in [employer] with husband Marcello Moores for 23 years   Stays busy with home, gardens, likes to can    Family History  Problem Relation Age of Onset  . Heart attack Mother   . Hyperlipidemia Mother   . Hypertension Mother   . Diabetes Mother   . Heart attack Father   . Early death Father 60  . Hyperlipidemia Father     . Hypertension Father   . Heart disease Sister   . Hyperlipidemia Sister   . Hypertension Sister   . Diabetes Brother   . Heart disease Sister   . Hyperlipidemia Sister   . Hypertension Sister   . Heart attack Sister   . Heart disease Sister   . Hyperlipidemia Sister   . Hypertension Sister   . Heart attack Brother   . Early death Brother 58  . Hyperlipidemia Brother   . Hypertension Brother   . Heart attack Brother   . Early death Brother 11  . Hyperlipidemia Brother   . Hypertension Brother   . Cancer Maternal Aunt     Review of Systems  Constitutional: Negative for chills, fever and weight loss.  HENT: Negative for congestion and hearing loss.   Eyes: Negative for blurred vision and pain.  Respiratory: Negative for cough and shortness of breath.   Cardiovascular: Negative for chest pain and leg swelling.  Gastrointestinal: Negative for abdominal pain, constipation, diarrhea and heartburn.  Genitourinary: Negative for dysuria and frequency.  Musculoskeletal: Negative for falls, joint pain and myalgias.  Neurological: Negative for dizziness, seizures and headaches.  Psychiatric/Behavioral: Negative for depression. The patient is not nervous/anxious and does not have insomnia.   Insists she feels absolutely well  BP 118/70   Pulse 98   Ht 5\' 1"  (1.549 m)   Wt 128 lb (58.1 kg)   SpO2 99%   BMI 24.19 kg/m   Physical Exam  Constitutional: She is oriented to person, place, and time. She appears well-developed and well-nourished.  HENT:  Head: Normocephalic and atraumatic.  Mouth/Throat: Oropharynx is clear and moist.  Eyes: Pupils are equal, round, and reactive to light. Conjunctivae are normal.  Neck: Normal range of motion. Neck supple. No thyromegaly present.  Cardiovascular: Normal rate, regular rhythm and normal heart sounds.   Pulmonary/Chest: Effort normal and breath sounds normal. No respiratory distress.  Faint rhonchi centrally, no wheeze  Abdominal:  Soft. Bowel sounds are normal.  Musculoskeletal: Normal range of motion. She exhibits no edema.  Lymphadenopathy:    She has no cervical adenopathy.  Neurological: She is alert and oriented to person, place, and time.  Gait normal  Skin: Skin is warm and dry.  Psychiatric: She has a normal mood and affect. Her behavior is normal. Thought content normal.  Nursing note and vitals reviewed. ASSESSMENT/PLAN:   1. Cigarette nicotine dependence without complication counseled - CT CHEST LUNG CA SCREEN LOW DOSE W/O CM; Future  2. Smokes with greater than 30 pack year history - CT CHEST LUNG CA SCREEN LOW DOSE W/O CM; Future  3. Encounter for screening for malignant neoplasm of respiratory organs   4. Essential hypertension controlled  5.  Hyperlipidemia, unspecified hyperlipidemia type Controlled by Hx.  Need records  6. Need for immunization against influenza - Flu Vaccine QUAD 36+ mos IM  7. Malignant neoplasm of urinary bladder, unspecified site [facility]) Referred back to urology.  Emphasized need for follow up and treatment   Patient Instructions  Try to cut down on the smoking I will order the lung scan Stay as active as you can manage We will Call [facility] to make appointment with Dr Huel Cote for follow up  See me in one month for a Physical Need records [facility]   Raylene Everts, MD

## 2016-10-31 ENCOUNTER — Encounter: Payer: Self-pay | Admitting: Family Medicine

## 2016-10-31 DIAGNOSIS — I714 Abdominal aortic aneurysm, without rupture, unspecified: Secondary | ICD-10-CM | POA: Insufficient documentation

## 2016-11-12 ENCOUNTER — Ambulatory Visit (HOSPITAL_COMMUNITY)
Admission: RE | Admit: 2016-11-12 | Discharge: 2016-11-12 | Disposition: A | Discharge status: Home / Self Care | Payer: Medicare Other | Source: Ambulatory Visit | Attending: Family Medicine | Admitting: Family Medicine

## 2016-11-12 DIAGNOSIS — F17210 Nicotine dependence, cigarettes, uncomplicated: Secondary | ICD-10-CM | POA: Diagnosis not present

## 2016-11-12 DIAGNOSIS — I700 Atherosclerosis of aorta: Secondary | ICD-10-CM | POA: Insufficient documentation

## 2016-11-12 DIAGNOSIS — I2510 Atherosclerotic heart disease of native coronary artery without angina pectoris: Secondary | ICD-10-CM | POA: Insufficient documentation

## 2016-11-12 DIAGNOSIS — R918 Other nonspecific abnormal finding of lung field: Secondary | ICD-10-CM | POA: Insufficient documentation

## 2016-11-12 DIAGNOSIS — J432 Centrilobular emphysema: Secondary | ICD-10-CM | POA: Insufficient documentation

## 2016-11-13 ENCOUNTER — Encounter: Payer: Self-pay | Admitting: Family Medicine

## 2016-11-13 DIAGNOSIS — I700 Atherosclerosis of aorta: Secondary | ICD-10-CM

## 2016-11-13 DIAGNOSIS — J439 Emphysema, unspecified: Secondary | ICD-10-CM | POA: Insufficient documentation

## 2016-11-13 DIAGNOSIS — J432 Centrilobular emphysema: Secondary | ICD-10-CM

## 2016-11-26 ENCOUNTER — Encounter: Payer: Self-pay | Admitting: Family Medicine

## 2016-11-26 ENCOUNTER — Ambulatory Visit (INDEPENDENT_AMBULATORY_CARE_PROVIDER_SITE_OTHER): Payer: Medicare Other | Admitting: Family Medicine

## 2016-11-26 VITALS — BP 110/68 | HR 100 | Temp 98.1°F | Resp 16 | Ht 61.0 in | Wt 126.1 lb

## 2016-11-26 DIAGNOSIS — E785 Hyperlipidemia, unspecified: Secondary | ICD-10-CM

## 2016-11-26 DIAGNOSIS — F17210 Nicotine dependence, cigarettes, uncomplicated: Secondary | ICD-10-CM

## 2016-11-26 DIAGNOSIS — J432 Centrilobular emphysema: Secondary | ICD-10-CM

## 2016-11-26 DIAGNOSIS — I10 Essential (primary) hypertension: Secondary | ICD-10-CM | POA: Diagnosis not present

## 2016-11-26 DIAGNOSIS — I739 Peripheral vascular disease, unspecified: Secondary | ICD-10-CM

## 2016-11-26 NOTE — Progress Notes (Signed)
Chief Complaint  Patient presents with  . Annual Exam   Here for physical examination. Since her last examination she had a lung CT to screen for cancer.  There was no cancer.  She did have emphysema and aortic atherosclerosis.  This is explained to her. She refuses Pap smear or mammogram.  She refuses colon cancer screening. Her blood pressure is well controlled. She has hyperlipidemia and is compliant with her Crestor.  I do not have any recent labs or lipid testing.  She agrees to go for lab work today. She continues to smoke cigarettes.  This is again discussed with her.  With known emphysema, peripheral artery disease, aortic atherosclerosis, cerebrovascular disease and bladder cancer is extremely important that she quit smoking.  She is not interested in a prescription.  She is not interested in patches.  She has cut down on her own and is on a plan to quit over the next couple of months.   Patient Active Problem List   Diagnosis Date Noted  . Emphysema lung ([place]) 11/13/2016  . Atherosclerosis of aorta ([place]) 11/13/2016  . AAA (abdominal aortic aneurysm) ([place]) 10/31/2016  . Nicotine dependence 10/25/2016  . CAD (coronary artery disease) of artery bypass graft 10/17/2012  . PAD (peripheral artery disease) ([place]) 10/17/2012  . Carotid stenosis 10/17/2012  . HTN (hypertension) 10/17/2012  . Hyperlipidemia 10/17/2012  . Tobacco abuse 10/17/2012    Outpatient Encounter Prescriptions as of 11/26/2016  Medication Sig  . aspirin 81 MG tablet Take 81 mg by mouth daily.  . clopidogrel (PLAVIX) 75 MG tablet TAKE ONE (1) TABLET BY MOUTH EVERY DAY  . fish oil-omega-3 fatty acids 1000 MG capsule Take 1 g by mouth daily.  Marland Kitchen lisinopril (PRINIVIL,ZESTRIL) 5 MG tablet TAKE ONE-HALF TABLET BY MOUTH ONCE A DAY  . metoprolol succinate (TOPROL-XL) 25 MG 24 hr tablet TAKE 1/2 TABLET DAILY  . nitroGLYCERIN (NITROSTAT) 0.4 MG SL tablet Place 1 tablet (0.4 mg total) under the tongue every 5 (five)  minutes as needed for chest pain.  . rosuvastatin (CRESTOR) 40 MG tablet TAKE ONE (1) TABLET BY MOUTH EVERY DAY   No facility-administered encounter medications on file as of 11/26/2016.     No Known Allergies  Review of Systems  Constitutional: Negative for activity change, appetite change and unexpected weight change.  HENT: Negative for congestion, dental problem, postnasal drip and rhinorrhea.   Eyes: Negative for redness and visual disturbance.  Respiratory: Negative for cough and shortness of breath.   Cardiovascular: Negative for chest pain, palpitations and leg swelling.  Gastrointestinal: Negative for abdominal pain, constipation and diarrhea.  Genitourinary: Negative for difficulty urinating, frequency and vaginal bleeding.  Musculoskeletal: Negative for arthralgias and back pain.  Neurological: Negative for dizziness and headaches.  Psychiatric/Behavioral: Negative for dysphoric mood and sleep disturbance. The patient is not nervous/anxious.    Denies any complaints today   BP 110/68 (BP Location: Right Arm, Patient Position: Sitting, Cuff Size: Normal)   Pulse 100   Temp 98.1 F (36.7 C) (Temporal)   Resp 16   Ht 5\' 1"  (1.549 m)   Wt 126 lb 1.9 oz (57.2 kg)   SpO2 97%   BMI 23.83 kg/m   Physical Exam  BP 110/68 (BP Location: Right Arm, Patient Position: Sitting, Cuff Size: Normal)   Pulse 100   Temp 98.1 F (36.7 C) (Temporal)   Resp 16   Ht 5\' 1"  (1.549 m)   Wt 126 lb 1.9 oz (57.2 kg)  SpO2 97%   BMI 23.83 kg/m   General Appearance:    Alert, cooperative, no distress, appears stated age  Head:    Normocephalic, without obvious abnormality, atraumatic  Eyes:    PERRL, conjunctiva/corneas clear, EOM's intact, fundi    benign, both eyes  Ears:    Normal TM's and external ear canals, both ears  Nose:   Nares normal, septum midline, mucosa normal, no drainage    or sinus tenderness  Throat:   Lips, mucosa, and tongue normal; edentulous, gums normal    Neck:   Supple, symmetrical, trachea midline, no adenopathy;    thyroid:  no enlargement/tenderness/nodules; no carotid   bruit  Back:     Symmetric, no curvature, ROM normal, no CVA tenderness  Lungs:     Clear to auscultation bilaterally, respirations unlabored  Chest Wall:    No tenderness or deformity   Heart:    Regular rate and rhythm, S1 and S2 normal, no murmur, rub   or gallop  Breast Exam:    No tenderness, masses, or nipple abnormality  Abdomen:     Soft, non-tender, bowel sounds active all four quadrants,    no masses, no organomegaly  Extremities:   Extremities normal, atraumatic, no cyanosis or edema  Pulses:   1+ and symmetric in feet  Skin:   Skin color, texture, turgor normal, no rashes or lesions  Lymph nodes:   Cervical, supraclavicular, and axillary nodes normal  Neurologic:   normal strength, sensation and reflexes    throughout     ASSESSMENT/PLAN:  1. Essential hypertension  2. PAD (peripheral artery disease) (HCC) - CBC - COMPLETE METABOLIC PANEL WITH GFR - Urinalysis, Routine w reflex microscopic  3. Centrilobular emphysema ([place])  4. Smokes with greater than 30 pack year history Discussed with patient.  Patient declines assistance.  5. Hyperlipidemia, unspecified hyperlipidemia type - Lipid panel   Patient Instructions  I am glad you have decided to try to reduce your smoking Call if I can help you Start by quitting the smoking in your house Walk every day that you are able See the Urologist next month  See me in six months Need blood work next time   Raylene Everts, MD

## 2016-11-26 NOTE — Patient Instructions (Addendum)
I am glad you have decided to try to reduce your smoking Call if I can help you Start by quitting the smoking in your house Walk every day that you are able See the Urologist next month  See me in six months Need blood work next time

## 2016-12-19 ENCOUNTER — Ambulatory Visit (INDEPENDENT_AMBULATORY_CARE_PROVIDER_SITE_OTHER): Payer: Medicare Other | Admitting: Urology

## 2016-12-19 DIAGNOSIS — R3129 Other microscopic hematuria: Secondary | ICD-10-CM

## 2016-12-28 ENCOUNTER — Other Ambulatory Visit: Payer: Self-pay | Admitting: Cardiology

## 2017-01-21 ENCOUNTER — Telehealth: Payer: Self-pay | Admitting: Family Medicine

## 2017-01-21 DIAGNOSIS — J0100 Acute maxillary sinusitis, unspecified: Secondary | ICD-10-CM | POA: Diagnosis not present

## 2017-01-21 NOTE — Telephone Encounter (Signed)
Pt came by the office, and I advised that there was no available appts today, referred her to Urgent Care or the ED...   Selena--Please disregard the prior msg... Pt is aware you will not be calling her back

## 2017-01-21 NOTE — Telephone Encounter (Signed)
Light headed, lost taste buds, feel real bad, headache, muscle aches (but they are going away) head is heavy, slight fever. Voice changed..[phone]

## 2017-01-24 ENCOUNTER — Telehealth: Payer: Self-pay | Admitting: Family Medicine

## 2017-01-24 NOTE — Telephone Encounter (Signed)
Called and spoke to Kaitlin [name], states shes been throwing up for a week, and is dizzy. She went to an urgent care on 01/21/17 and they gave her abx - amoxicillin, which she is throwing up as well. Do you want to see her?

## 2017-01-24 NOTE — Telephone Encounter (Signed)
I faxed a request to [facility] (called patient to verify)  (p) [phone] (f) [phone]

## 2017-01-24 NOTE — Telephone Encounter (Signed)
Pt is calling in advising that she is sick and needs to be seen today, she cant keep anything down.

## 2017-01-24 NOTE — Telephone Encounter (Signed)
Can you get me the urgent care report promptly??

## 2017-01-26 ENCOUNTER — Other Ambulatory Visit (HOSPITAL_COMMUNITY): Payer: Medicare Other

## 2017-01-26 ENCOUNTER — Other Ambulatory Visit: Payer: Self-pay

## 2017-01-26 ENCOUNTER — Emergency Department (HOSPITAL_COMMUNITY): Payer: Medicare Other

## 2017-01-26 ENCOUNTER — Inpatient Hospital Stay (HOSPITAL_COMMUNITY): Payer: Medicare Other

## 2017-01-26 ENCOUNTER — Other Ambulatory Visit: Payer: Self-pay | Admitting: Emergency Medicine

## 2017-01-26 ENCOUNTER — Encounter (HOSPITAL_COMMUNITY): Payer: Self-pay | Admitting: *Deleted

## 2017-01-26 ENCOUNTER — Inpatient Hospital Stay (HOSPITAL_COMMUNITY)
Admission: EM | Admit: 2017-01-26 | Discharge: 2017-01-28 | DRG: 392 | Disposition: A | Discharge status: Home / Self Care | Payer: Medicare Other | Attending: Internal Medicine | Admitting: Internal Medicine

## 2017-01-26 DIAGNOSIS — F17200 Nicotine dependence, unspecified, uncomplicated: Secondary | ICD-10-CM | POA: Diagnosis present

## 2017-01-26 DIAGNOSIS — Z7982 Long term (current) use of aspirin: Secondary | ICD-10-CM | POA: Diagnosis not present

## 2017-01-26 DIAGNOSIS — D649 Anemia, unspecified: Secondary | ICD-10-CM

## 2017-01-26 DIAGNOSIS — R946 Abnormal results of thyroid function studies: Secondary | ICD-10-CM | POA: Diagnosis present

## 2017-01-26 DIAGNOSIS — Z8249 Family history of ischemic heart disease and other diseases of the circulatory system: Secondary | ICD-10-CM | POA: Diagnosis not present

## 2017-01-26 DIAGNOSIS — I739 Peripheral vascular disease, unspecified: Secondary | ICD-10-CM | POA: Diagnosis present

## 2017-01-26 DIAGNOSIS — J449 Chronic obstructive pulmonary disease, unspecified: Secondary | ICD-10-CM | POA: Diagnosis present

## 2017-01-26 DIAGNOSIS — F17210 Nicotine dependence, cigarettes, uncomplicated: Secondary | ICD-10-CM

## 2017-01-26 DIAGNOSIS — Z8349 Family history of other endocrine, nutritional and metabolic diseases: Secondary | ICD-10-CM | POA: Diagnosis not present

## 2017-01-26 DIAGNOSIS — K2950 Unspecified chronic gastritis without bleeding: Secondary | ICD-10-CM | POA: Diagnosis not present

## 2017-01-26 DIAGNOSIS — I252 Old myocardial infarction: Secondary | ICD-10-CM | POA: Diagnosis not present

## 2017-01-26 DIAGNOSIS — Z955 Presence of coronary angioplasty implant and graft: Secondary | ICD-10-CM | POA: Diagnosis not present

## 2017-01-26 DIAGNOSIS — R531 Weakness: Secondary | ICD-10-CM | POA: Diagnosis not present

## 2017-01-26 DIAGNOSIS — R109 Unspecified abdominal pain: Secondary | ICD-10-CM | POA: Diagnosis not present

## 2017-01-26 DIAGNOSIS — E785 Hyperlipidemia, unspecified: Secondary | ICD-10-CM | POA: Diagnosis not present

## 2017-01-26 DIAGNOSIS — K8020 Calculus of gallbladder without cholecystitis without obstruction: Secondary | ICD-10-CM | POA: Diagnosis not present

## 2017-01-26 DIAGNOSIS — K219 Gastro-esophageal reflux disease without esophagitis: Secondary | ICD-10-CM | POA: Diagnosis not present

## 2017-01-26 DIAGNOSIS — I219 Acute myocardial infarction, unspecified: Secondary | ICD-10-CM | POA: Diagnosis not present

## 2017-01-26 DIAGNOSIS — I2510 Atherosclerotic heart disease of native coronary artery without angina pectoris: Secondary | ICD-10-CM | POA: Diagnosis present

## 2017-01-26 DIAGNOSIS — R6881 Early satiety: Secondary | ICD-10-CM

## 2017-01-26 DIAGNOSIS — Z7902 Long term (current) use of antithrombotics/antiplatelets: Secondary | ICD-10-CM

## 2017-01-26 DIAGNOSIS — K228 Other specified diseases of esophagus: Secondary | ICD-10-CM | POA: Diagnosis not present

## 2017-01-26 DIAGNOSIS — A084 Viral intestinal infection, unspecified: Secondary | ICD-10-CM | POA: Diagnosis present

## 2017-01-26 DIAGNOSIS — R0602 Shortness of breath: Secondary | ICD-10-CM

## 2017-01-26 DIAGNOSIS — R5383 Other fatigue: Secondary | ICD-10-CM | POA: Diagnosis not present

## 2017-01-26 DIAGNOSIS — K819 Cholecystitis, unspecified: Secondary | ICD-10-CM | POA: Diagnosis not present

## 2017-01-26 DIAGNOSIS — I10 Essential (primary) hypertension: Secondary | ICD-10-CM

## 2017-01-26 DIAGNOSIS — N184 Chronic kidney disease, stage 4 (severe): Secondary | ICD-10-CM | POA: Diagnosis present

## 2017-01-26 DIAGNOSIS — E860 Dehydration: Secondary | ICD-10-CM | POA: Diagnosis present

## 2017-01-26 DIAGNOSIS — R140 Abdominal distension (gaseous): Secondary | ICD-10-CM | POA: Diagnosis not present

## 2017-01-26 DIAGNOSIS — D631 Anemia in chronic kidney disease: Secondary | ICD-10-CM

## 2017-01-26 DIAGNOSIS — I700 Atherosclerosis of aorta: Secondary | ICD-10-CM | POA: Diagnosis present

## 2017-01-26 DIAGNOSIS — N179 Acute kidney failure, unspecified: Secondary | ICD-10-CM

## 2017-01-26 DIAGNOSIS — K2970 Gastritis, unspecified, without bleeding: Secondary | ICD-10-CM | POA: Diagnosis present

## 2017-01-26 DIAGNOSIS — I714 Abdominal aortic aneurysm, without rupture, unspecified: Secondary | ICD-10-CM | POA: Diagnosis present

## 2017-01-26 DIAGNOSIS — R1084 Generalized abdominal pain: Secondary | ICD-10-CM

## 2017-01-26 DIAGNOSIS — I129 Hypertensive chronic kidney disease with stage 1 through stage 4 chronic kidney disease, or unspecified chronic kidney disease: Secondary | ICD-10-CM | POA: Diagnosis present

## 2017-01-26 DIAGNOSIS — R1011 Right upper quadrant pain: Secondary | ICD-10-CM

## 2017-01-26 DIAGNOSIS — J432 Centrilobular emphysema: Secondary | ICD-10-CM

## 2017-01-26 DIAGNOSIS — R112 Nausea with vomiting, unspecified: Secondary | ICD-10-CM | POA: Diagnosis not present

## 2017-01-26 DIAGNOSIS — K5730 Diverticulosis of large intestine without perforation or abscess without bleeding: Secondary | ICD-10-CM | POA: Diagnosis not present

## 2017-01-26 DIAGNOSIS — Z720 Tobacco use: Secondary | ICD-10-CM | POA: Diagnosis present

## 2017-01-26 DIAGNOSIS — J439 Emphysema, unspecified: Secondary | ICD-10-CM | POA: Diagnosis present

## 2017-01-26 DIAGNOSIS — R05 Cough: Secondary | ICD-10-CM | POA: Diagnosis not present

## 2017-01-26 HISTORY — DX: Anemia, unspecified: D64.9

## 2017-01-26 LAB — BPAM RBC
BLOOD PRODUCT EXPIRATION DATE: 201901252359
BLOOD PRODUCT EXPIRATION DATE: 201901252359
Blood Product Expiration Date: 201901252359
Blood Product Expiration Date: 201901252359
ISSUE DATE / TIME: 201812290800
ISSUE DATE / TIME: 201812292133
ISSUE DATE / TIME: 201812290800
ISSUE DATE / TIME: 201812290814
UNIT TYPE AND RH: 5100
UNIT TYPE AND RH: 5100
Unit Type and Rh: 5100
Unit Type and Rh: 5100

## 2017-01-26 LAB — URINALYSIS, ROUTINE W REFLEX MICROSCOPIC
Bilirubin Urine: NEGATIVE
GLUCOSE, UA: NEGATIVE mg/dL
KETONES UR: NEGATIVE mg/dL
Nitrite: NEGATIVE
PH: 5 (ref 5.0–8.0)
Protein, ur: 100 mg/dL — AB
Specific Gravity, Urine: 1.016 (ref 1.005–1.030)

## 2017-01-26 LAB — IRON AND TIBC
IRON: 92 ug/dL (ref 28–170)
Saturation Ratios: 28 % (ref 10.4–31.8)
TIBC: 330 ug/dL (ref 250–450)
UIBC: 238 ug/dL

## 2017-01-26 LAB — COMPREHENSIVE METABOLIC PANEL
ALBUMIN: 3.2 g/dL — AB (ref 3.5–5.0)
ALK PHOS: 88 U/L (ref 38–126)
ALT: 17 U/L (ref 14–54)
AST: 28 U/L (ref 15–41)
Anion gap: 11 (ref 5–15)
BUN: 66 mg/dL — AB (ref 6–20)
CALCIUM: 9.3 mg/dL (ref 8.9–10.3)
CHLORIDE: 103 mmol/L (ref 101–111)
CO2: 25 mmol/L (ref 22–32)
CREATININE: 3.53 mg/dL — AB (ref 0.44–1.00)
GFR calc Af Amer: 15 mL/min — ABNORMAL LOW (ref >60)
GFR calc non Af Amer: 13 mL/min — ABNORMAL LOW (ref >60)
GLUCOSE: 120 mg/dL — AB (ref 65–99)
Potassium: 4.5 mmol/L (ref 3.5–5.1)
SODIUM: 139 mmol/L (ref 135–145)
Total Bilirubin: 0.8 mg/dL (ref 0.3–1.2)
Total Protein: 7.8 g/dL (ref 6.5–8.1)

## 2017-01-26 LAB — CBC
HCT: 20.5 % — ABNORMAL LOW (ref 36.0–46.0)
Hemoglobin: 6.7 g/dL — CL (ref 12.0–15.0)
MCH: 32.1 pg (ref 26.0–34.0)
MCHC: 32.7 g/dL (ref 30.0–36.0)
MCV: 98.1 fL (ref 78.0–100.0)
PLATELETS: 353 10*3/uL (ref 150–400)
RBC: 2.09 MIL/uL — ABNORMAL LOW (ref 3.87–5.11)
RDW: 14.7 % (ref 11.5–15.5)
WBC: 8.8 10*3/uL (ref 4.0–10.5)

## 2017-01-26 LAB — FERRITIN: Ferritin: 256 ng/mL (ref 11–307)

## 2017-01-26 LAB — RETICULOCYTES
RBC.: 1.97 MIL/uL — ABNORMAL LOW (ref 3.87–5.11)
RETIC COUNT ABSOLUTE: 47.3 10*3/uL (ref 19.0–186.0)
RETIC CT PCT: 2.4 % (ref 0.4–3.1)

## 2017-01-26 LAB — I-STAT CG4 LACTIC ACID, ED
Lactic Acid, Venous: 1.08 mmol/L (ref 0.5–1.9)
Lactic Acid, Venous: 0.58 mmol/L (ref 0.5–1.9)

## 2017-01-26 LAB — VITAMIN B12: VITAMIN B 12: 198 pg/mL (ref 180–914)

## 2017-01-26 LAB — POC OCCULT BLOOD, ED: Fecal Occult Bld: NEGATIVE

## 2017-01-26 LAB — HEMOGLOBIN A1C
Hgb A1c MFr Bld: 5.2 % (ref 4.8–5.6)
MEAN PLASMA GLUCOSE: 102.54 mg/dL

## 2017-01-26 LAB — LIPASE, BLOOD: LIPASE: 90 U/L — AB (ref 11–51)

## 2017-01-26 LAB — PREPARE RBC (CROSSMATCH)

## 2017-01-26 LAB — FOLATE: Folate: 13.5 ng/mL (ref >5.9)

## 2017-01-26 LAB — TSH: TSH: 0.282 u[IU]/mL — AB (ref 0.350–4.500)

## 2017-01-26 LAB — LACTATE DEHYDROGENASE: LDH: 245 U/L — AB (ref 98–192)

## 2017-01-26 MED ORDER — SODIUM CHLORIDE 0.9 % IV BOLUS (SEPSIS)
500.0000 mL | Freq: Once | INTRAVENOUS | Status: AC
  Administered 2017-01-26: 500 mL via INTRAVENOUS

## 2017-01-26 MED ORDER — ROSUVASTATIN CALCIUM 10 MG PO TABS
40.0000 mg | ORAL_TABLET | Freq: Every day | ORAL | Status: DC
  Administered 2017-01-27: 40 mg via ORAL
  Filled 2017-01-26: qty 4

## 2017-01-26 MED ORDER — ONDANSETRON HCL 4 MG/2ML IJ SOLN: 4.0000 mg | Freq: Four times a day (QID) | INTRAMUSCULAR | Status: DC | PRN

## 2017-01-26 MED ORDER — ONDANSETRON HCL 4 MG PO TABS: 4.0000 mg | ORAL_TABLET | Freq: Four times a day (QID) | ORAL | Status: DC | PRN

## 2017-01-26 MED ORDER — SODIUM CHLORIDE 0.9 % IV SOLN
Freq: Once | INTRAVENOUS | Status: AC
  Administered 2017-01-27: 03:00:00 via INTRAVENOUS

## 2017-01-26 MED ORDER — POLYETHYLENE GLYCOL 3350 17 G PO PACK: 17.0000 g | PACK | Freq: Every day | ORAL | Status: DC | PRN

## 2017-01-26 MED ORDER — ACETAMINOPHEN 650 MG RE SUPP: 650.0000 mg | Freq: Four times a day (QID) | RECTAL | Status: DC | PRN

## 2017-01-26 MED ORDER — TRAMADOL HCL 50 MG PO TABS: 50.0000 mg | ORAL_TABLET | Freq: Four times a day (QID) | ORAL | Status: DC | PRN

## 2017-01-26 MED ORDER — ACETAMINOPHEN 325 MG PO TABS
650.0000 mg | ORAL_TABLET | Freq: Four times a day (QID) | ORAL | Status: DC | PRN
  Administered 2017-01-26 – 2017-01-28 (×2): 650 mg via ORAL
  Filled 2017-01-26 (×2): qty 2

## 2017-01-26 MED ORDER — OMEGA-3-ACID ETHYL ESTERS 1 G PO CAPS
1.0000 g | ORAL_CAPSULE | Freq: Every day | ORAL | Status: DC
  Administered 2017-01-27 – 2017-01-28 (×2): 1 g via ORAL
  Filled 2017-01-26 (×2): qty 1

## 2017-01-26 MED ORDER — POTASSIUM CHLORIDE IN NACL 20-0.9 MEQ/L-% IV SOLN
INTRAVENOUS | Status: DC
  Administered 2017-01-27: 04:00:00 via INTRAVENOUS
  Filled 2017-01-26: qty 1000

## 2017-01-26 MED ORDER — SODIUM CHLORIDE 0.9 % IV BOLUS (SEPSIS): 1000.0000 mL | Freq: Once | INTRAVENOUS | Status: DC

## 2017-01-26 MED ORDER — OMEGA-3 FATTY ACIDS 1000 MG PO CAPS: 1.0000 g | ORAL_CAPSULE | Freq: Every day | ORAL | Status: DC

## 2017-01-26 NOTE — ED Notes (Signed)
Patient transported to X-ray 

## 2017-01-26 NOTE — ED Provider Notes (Signed)
[facility] EMERGENCY DEPARTMENT Provider Note   CSN: [id number] Arrival date & time: 01/26/17  1348     History   Chief Complaint Chief Complaint  Patient presents with  . Abdominal Pain  . Emesis    HPI Kaitlin [name] is a 66 y.o. female.  Patient is a 66 year old female with a history of hypertension, hyperlipidemia, COPD, coronary artery disease and 3.1 cm AAA who presents with abdominal pain and vomiting.  She states over the last 2 weeks she has had nausea and vomiting with fatigue as well as a cough.  She has had some chills but no known fevers.  She has no diarrhea.  She is having normal bowel movements which are nonbloody and without melena.  She states over the last 2 days she started having some abdominal pain which is a vague discomfort in the center of her abdomen.  It is otherwise nonradiating.  She did have some pain in her right posterior shoulder earlier today but none currently.  She was seen in urgent care and given Phenergan and sent here for further evaluation.  Her nausea is better.  She states over the last several days she has felt very fatigued and has been dizzy.  She denies any shortness of breath.  She has a cough which is moderately productive.  She denies any hematemesis.  No blood in her urine.  No urinary symptoms.  Her AAA was last evaluated in 2017 and was 3.1 cm.      Past Medical History:  Diagnosis Date  . Arthritis   . CAD (coronary artery disease)    stents  . COPD (chronic obstructive pulmonary disease) ([place])   . Emphysema of lung ([place])   . GERD (gastroesophageal reflux disease)   . Hyperlipidemia   . Hypertension   . Iliac artery stenosis, right ([facility])    60%-70%  . Myocardial infarction ([place])   . PAD (peripheral artery disease) ([place]) 12/14/2010   in the left vertebral, bilateral carotids    Patient Active Problem List   Diagnosis Date Noted  . Emphysema lung ([place]) 11/13/2016  . Atherosclerosis of aorta ([place])  11/13/2016  . AAA (abdominal aortic aneurysm) ([place]) 10/31/2016  . Nicotine dependence 10/25/2016  . CAD (coronary artery disease) of artery bypass graft 10/17/2012  . PAD (peripheral artery disease) ([place]) 10/17/2012  . Carotid stenosis 10/17/2012  . HTN (hypertension) 10/17/2012  . Hyperlipidemia 10/17/2012  . Tobacco abuse 10/17/2012    Past Surgical History:  Procedure Laterality Date  . CARDIAC CATHETERIZATION  02/04/2005   A cypher 5.3 x 18 mm at 16 atmosphere of pressure in the mid LAD, this stent covered the proximal part of the LAD and also the mid LAD, this was then post dilated with 3.25 x 15 mm Quantum at 16 atmosphereof pressure for 45 seconds  . TUBAL LIGATION      OB History    No data available       Home Medications    Prior to Admission medications   Medication Sig Start Date End Date Taking? Authorizing Provider  aspirin 81 MG tablet Take 81 mg by mouth daily.    [provider]  clopidogrel (PLAVIX) 75 MG tablet TAKE ONE (1) TABLET BY MOUTH EVERY DAY 12/28/16   Arnoldo Lenis, MD  fish oil-omega-3 fatty acids 1000 MG capsule Take 1 g by mouth daily.    [provider]  lisinopril (PRINIVIL,ZESTRIL) 5 MG tablet TAKE ONE-HALF TABLET BY MOUTH DAILY.  12/28/16   Arnoldo Lenis, MD  metoprolol succinate (TOPROL-XL) 25 MG 24 hr tablet TAKE ONE-HALF TABLET BY MOUTH DAILY. 12/28/16   Arnoldo Lenis, MD  nitroGLYCERIN (NITROSTAT) 0.4 MG SL tablet Place 1 tablet (0.4 mg total) under the tongue every 5 (five) minutes as needed for chest pain. 10/09/13   Arnoldo Lenis, MD  rosuvastatin (CRESTOR) 40 MG tablet TAKE ONE (1) TABLET BY MOUTH EVERY DAY 12/28/16   Arnoldo Lenis, MD    Family History Family History  Problem Relation Age of Onset  . Heart attack Mother   . Hyperlipidemia Mother   . Hypertension Mother   . Diabetes Mother   . Heart attack Father   . Early death Father 64  . Hyperlipidemia Father   . Hypertension Father    . Heart disease Sister   . Hyperlipidemia Sister   . Hypertension Sister   . Diabetes Brother   . Heart disease Sister   . Hyperlipidemia Sister   . Hypertension Sister   . Heart attack Sister   . Heart disease Sister   . Hyperlipidemia Sister   . Hypertension Sister   . Heart attack Brother   . Early death Brother 69  . Hyperlipidemia Brother   . Hypertension Brother   . Heart attack Brother   . Early death Brother 75  . Hyperlipidemia Brother   . Hypertension Brother   . Cancer Maternal Aunt     Social History Social History   Tobacco Use  . Smoking status: Current Every Day Smoker    Packs/day: 1.00    Years: 49.00    Pack years: 49.00    Types: Cigarettes    Start date: 02/09/1967  . Smokeless tobacco: Former Network engineer Use Topics  . Alcohol use: No  . Drug use: No     Allergies   Patient has no known allergies.   Review of Systems Review of Systems  Constitutional: Positive for appetite change, chills and fatigue. Negative for diaphoresis and fever.  HENT: Negative for congestion, rhinorrhea and sneezing.   Eyes: Negative.   Respiratory: Positive for cough. Negative for chest tightness and shortness of breath.   Cardiovascular: Negative for chest pain and leg swelling.  Gastrointestinal: Positive for abdominal pain, nausea and vomiting. Negative for blood in stool and diarrhea.  Genitourinary: Negative for difficulty urinating, flank pain, frequency and hematuria.  Musculoskeletal: Negative for arthralgias and back pain.  Skin: Negative for rash.  Neurological: Positive for light-headedness. Negative for dizziness, speech difficulty, weakness, numbness and headaches.     Physical Exam Updated Vital Signs BP (!) 107/52   Pulse (!) 121   Temp 97.6 F (36.4 C) (Oral)   Resp 16   SpO2 99%   Physical Exam  Constitutional: She is oriented to person, place, and time. She appears well-developed and well-nourished.  HENT:  Head: Normocephalic  and atraumatic.  Eyes: Pupils are equal, round, and reactive to light.  Pale conjunctivae  Neck: Normal range of motion. Neck supple.  Cardiovascular: Normal rate, regular rhythm and normal heart sounds.  Pulmonary/Chest: Effort normal and breath sounds normal. No respiratory distress. She has no wheezes. She has no rales. She exhibits no tenderness.  Abdominal: Soft. Bowel sounds are normal. There is no tenderness. There is no rebound and no guarding.  Mild discomfort in the periumbilical area, femoral pulses present bilaterally  Musculoskeletal: Normal range of motion. She exhibits no edema.  Lymphadenopathy:    She has no cervical  adenopathy.  Neurological: She is alert and oriented to person, place, and time.  Skin: Skin is warm and dry. No rash noted.  Psychiatric: She has a normal mood and affect.     ED Treatments / Results  Labs (all labs ordered are listed, but only abnormal results are displayed) Labs Reviewed  LIPASE, BLOOD - Abnormal; Notable for the following components:      Result Value   Lipase 90 (*)    All other components within normal limits  COMPREHENSIVE METABOLIC PANEL - Abnormal; Notable for the following components:   Glucose, Bld 120 (*)    BUN 66 (*)    Creatinine, Ser 3.53 (*)    Albumin 3.2 (*)    GFR calc non Af Amer 13 (*)    GFR calc Af Amer 15 (*)    All other components within normal limits  CBC - Abnormal; Notable for the following components:   RBC 2.09 (*)    Hemoglobin 6.7 (*)    HCT 20.5 (*)    All other components within normal limits  URINALYSIS, ROUTINE W REFLEX MICROSCOPIC  I-STAT CG4 LACTIC ACID, ED  I-STAT CG4 LACTIC ACID, ED  POC OCCULT BLOOD, ED  TYPE AND SCREEN  PREPARE RBC (CROSSMATCH)    EKG  EKG Interpretation None       Radiology Ct Abdomen Pelvis Wo Contrast  Result Date: 01/26/2017 CLINICAL DATA:  Abdominal pain, most severe on the right. Fever. Headache. Fatigue. Vomiting. Anorexia. EXAM: CT ABDOMEN AND  PELVIS WITHOUT CONTRAST TECHNIQUE: Multidetector CT imaging of the abdomen and pelvis was performed following the standard protocol without IV contrast. COMPARISON:  09/26/2015 CT abdomen/ pelvis. FINDINGS: Lower chest: Mild patchy tree-in-bud opacities in the anterior lower lobes bilaterally, largely new. Coronary atherosclerosis. Hepatobiliary: Normal liver size. Granulomatous posterior right liver lobe calcification. No liver masses. Cholelithiasis. No gallbladder wall thickening or pericholecystic fluid. No biliary ductal dilatation. Pancreas: Normal, with no mass or duct dilation. Spleen: Normal size. No mass. Adrenals/Urinary Tract: Normal right adrenal. Mild thickening of the left adrenal gland without discrete left adrenal nodules, unchanged. No renal stones. No hydronephrosis. Simple 1.2 cm interpolar left renal cyst. Subcentimeter hypodense interpolar right renal cortical lesion is too small to characterize and is stable, considered benign. No new contour deforming renal masses. Normal bladder. Stomach/Bowel: Normal non-distended stomach. Normal caliber small bowel with no small bowel wall thickening. Normal appendix. Mild sigmoid diverticulosis, with no large bowel wall thickening or pericolonic fat stranding. Vascular/Lymphatic: Atherosclerotic abdominal aorta with stable 3.2 cm infrarenal abdominal aortic aneurysm. No pathologically enlarged lymph nodes in the abdomen or pelvis. Reproductive: Grossly normal uterus.  No adnexal mass. Other: No pneumoperitoneum, ascites or focal fluid collection. Musculoskeletal: No aggressive appearing focal osseous lesions. Stable advanced degenerative changes at the sacroiliac joints. Stable advanced degenerative disc disease in the mid to lower lumbar spine. IMPRESSION: 1. No acute abnormality in the abdomen or pelvis. No evidence of bowel obstruction or acute bowel inflammation. Mild sigmoid diverticulosis, with no evidence of acute diverticulitis. 2. Cholelithiasis,  with no evidence of acute cholecystitis. 3. Mild patchy tree-in-bud opacities in the lower lobes, indicative of a nonspecific mild infectious or inflammatory bronchiolitis. 4. Stable 3.2 cm infrarenal abdominal aortic aneurysm. Recommend followup by ultrasound in 3 years. This recommendation follows ACR consensus guidelines: White Paper of the ACR Incidental Findings Committee II on Vascular Findings. J Am Coll Radiol 2013; 99:371-696. 5.  Aortic Atherosclerosis (ICD10-I70.0). Electronically Signed   By: Janina Mayo.D.  On: 01/26/2017 16:17   Dg Chest 2 View  Result Date: 01/26/2017 CLINICAL DATA:  Productive cough for several weeks EXAM: CHEST  2 VIEW COMPARISON:  11/12/2016 FINDINGS: The heart size and mediastinal contours are within normal limits. Both lungs are clear. The visualized skeletal structures are unremarkable. IMPRESSION: No active cardiopulmonary disease. Electronically Signed   By: Inez Catalina M.D.   On: 01/26/2017 16:27    Procedures Procedures (including critical care time)  Medications Ordered in ED Medications  0.9 %  sodium chloride infusion (not administered)  sodium chloride 0.9 % bolus 500 mL (500 mLs Intravenous New Bag/Given 01/26/17 1657)  sodium chloride 0.9 % bolus 500 mL (not administered)     Initial Impression / Assessment and Plan / ED Course  I have reviewed the triage vital signs and the nursing notes.  Pertinent labs & imaging results that were available during my care of the patient were reviewed by me and considered in my medical decision making (see chart for details).     Patient is a 66 year old female who presents with a 2-week history of some flulike symptoms with ongoing vomiting.  She does not have any known blood in her emesis or blood in her stool.  Her Hemoccult is negative.  Her hemoglobin is markedly low at 6.5.  She was given IV fluids and type and cross for 2 units of packed red cells.  She also has an acute kidney injury, likely from  dehydration.  Given her underlying abdominal pain, I was concerned about a ruptured AAA with the anemia however her AAA appears to be stable.  There is no other signs of acute intra-abdominal pathology.  I spoke with the hospitalist who will admit the patient for further evaluation.  Final Clinical Impressions(s) / ED Diagnoses   Final diagnoses:  Non-intractable vomiting with nausea, unspecified vomiting type  Anemia, unspecified type  AKI (acute kidney injury) [facility])    ED Discharge Orders    None       Malvin Johns, MD 01/26/17 [phone]

## 2017-01-26 NOTE — ED Triage Notes (Addendum)
Pt reports ongoing abd pain, more severe on right side. Has lack of appetite, fever, headache, fatigue, vomiting. Sent here for further eval to check gallbladder. Given phenergan pta.

## 2017-01-26 NOTE — ED Notes (Signed)
Patient transported to CT 

## 2017-01-26 NOTE — H&P (Signed)
History and Physical    Kaitlin [name] FXT:[id number] DOB: [birth year] DOA: 01/26/2017  PCP: Raylene Everts, MD   Patient coming from: Home  Chief Complaint: Nausea and Vomiting   HPI: Kaitlin [name] is a 66 y.o. female with medical history significant of CAD s/p Stenting, COPD, GERD, HTN, HLD, Normocytic Anemia, PAD, and other comorbids who presented to [facility] with a cc of on and off nausea and vomiting with an inability to keep any food down. She had some Right upper quadrant pain as well and has been feeling lightheaded and dizzy when walking. No SOB. Denies any blood in urine and stool and states her vomit contains no blood. Has not been taking NSAIDs. Was seen at Urgent Care and referred to ED for evaluation. TRH was consulted for Intractable N/V and Symptomatic Anemia.  ED Course: Had basic blood work, CT Abd/Pelvis and given 500 mL Bolus x2.   Review of Systems: As per HPI otherwise 10 point review of systems negative. No CP and had mild burning on urination one day this past week.   Past Medical History:  Diagnosis Date  . Arthritis   . CAD (coronary artery disease)    stents  . COPD (chronic obstructive pulmonary disease) ([place])   . Emphysema of lung ([place])   . GERD (gastroesophageal reflux disease)   . Hyperlipidemia   . Hypertension   . Iliac artery stenosis, right (HCC)    60%-70%  . Myocardial infarction ([place])   . Normocytic anemia 01/26/2017  . PAD (peripheral artery disease) ([place]) 12/14/2010   in the left vertebral, bilateral carotids   Past Surgical History:  Procedure Laterality Date  . CARDIAC CATHETERIZATION  02/04/2005   A cypher 5.3 x 18 mm at 16 atmosphere of pressure in the mid LAD, this stent covered the proximal part of the LAD and also the mid LAD, this was then post dilated with 3.25 x 15 mm Quantum at 16 atmosphereof pressure for 45 seconds  . TUBAL LIGATION     SOCIAL HISTORY  reports that she has been smoking cigarettes.  She started smoking  about 49 years ago. She has a 49.00 pack-year smoking history. She has quit using smokeless tobacco. She reports that she does not drink alcohol or use drugs.  ALLERGIES No Known Allergies  Family History  Problem Relation Age of Onset  . Heart attack Mother   . Hyperlipidemia Mother   . Hypertension Mother   . Diabetes Mother   . Heart attack Father   . Early death Father 25  . Hyperlipidemia Father   . Hypertension Father   . Heart disease Sister   . Hyperlipidemia Sister   . Hypertension Sister   . Diabetes Brother   . Heart disease Sister   . Hyperlipidemia Sister   . Hypertension Sister   . Heart attack Sister   . Heart disease Sister   . Hyperlipidemia Sister   . Hypertension Sister   . Heart attack Brother   . Early death Brother 44  . Hyperlipidemia Brother   . Hypertension Brother   . Heart attack Brother   . Early death Brother 22  . Hyperlipidemia Brother   . Hypertension Brother   . Cancer Maternal Aunt    Prior to Admission medications   Medication Sig Start Date End Date Taking? Authorizing Provider  aspirin 81 MG tablet Take 81 mg by mouth daily.    [provider]  clopidogrel (PLAVIX) 75 MG tablet TAKE ONE (  1) TABLET BY MOUTH EVERY DAY 12/28/16   Arnoldo Lenis, MD  fish oil-omega-3 fatty acids 1000 MG capsule Take 1 g by mouth daily.    [provider]  lisinopril (PRINIVIL,ZESTRIL) 5 MG tablet TAKE ONE-HALF TABLET BY MOUTH DAILY. 12/28/16   Arnoldo Lenis, MD  metoprolol succinate (TOPROL-XL) 25 MG 24 hr tablet TAKE ONE-HALF TABLET BY MOUTH DAILY. 12/28/16   Arnoldo Lenis, MD  nitroGLYCERIN (NITROSTAT) 0.4 MG SL tablet Place 1 tablet (0.4 mg total) under the tongue every 5 (five) minutes as needed for chest pain. 10/09/13   Arnoldo Lenis, MD  rosuvastatin (CRESTOR) 40 MG tablet TAKE ONE (1) TABLET BY MOUTH EVERY DAY 12/28/16   Arnoldo Lenis, MD   Physical Exam: Vitals:   01/26/17 1530 01/26/17 1654 01/26/17  1821 01/26/17 1852  BP: (!) 107/52 (!) 112/49 (!) 103/58 (!) 101/58  Pulse: (!) 121 (!) 111 (!) 104 96  Resp:   (!) 24   Temp:    98.4 F (36.9 C)  TempSrc:    Axillary  SpO2: 99% 98% 98% 97%   Constitutional: Thin Caucasian female in NAD and appears calm  Eyes: Pupils myotic. Lids  Normal but conjunctivae has some pallor, sclerae anicteric  ENMT: External Ears, Nose appear normal. Grossly normal hearing. Mucous membranes are dry.  Neck: Appears normal, supple, no cervical masses, normal ROM, no appreciable thyromegaly, no JVD Respiratory: Diminished to auscultation bilaterally, no wheezing, rales, rhonchi or crackles. Normal respiratory effort and patient is not tachypenic. No accessory muscle use.  Cardiovascular: Tachycardic rate but regular rhythm, no murmurs / rubs / gallops. S1 and S2 auscultated. .  Abdomen: Soft, mildly tender, non-distended. Bowel sounds present.  GU: Deferred. Musculoskeletal: No clubbing / cyanosis of digits/nails. No joint deformity upper and lower extremities.  Skin: No rashes, lesions, ulcers. No induration; Warm and dry.  Neurologic: CN 2-12 grossly intact with no focal deficits. Romberg sign and cerebellar reflexes not assessed. Psychiatric: Normal judgment and insight. Alert and oriented x 3. Normal mood and appropriate affect.   Labs on Admission: I have personally reviewed following labs and imaging studies  CBC: Recent Labs  Lab 01/26/17 1400  WBC 8.8  HGB 6.7*  HCT 20.5*  MCV 98.1  PLT 865   Basic Metabolic Panel: Recent Labs  Lab 01/26/17 1400  NA 139  K 4.5  CL 103  CO2 25  GLUCOSE 120*  BUN 66*  CREATININE 3.53*  CALCIUM 9.3   GFR: CrCl cannot be calculated (Unknown ideal weight.). Liver Function Tests: Recent Labs  Lab 01/26/17 1400  AST 28  ALT 17  ALKPHOS 88  BILITOT 0.8  PROT 7.8  ALBUMIN 3.2*   Recent Labs  Lab 01/26/17 1400  LIPASE 90*   No results for input(s): AMMONIA in the last 168 hours. Coagulation  Profile: No results for input(s): INR, PROTIME in the last 168 hours. Cardiac Enzymes: No results for input(s): CKTOTAL, CKMB, CKMBINDEX, TROPONINI in the last 168 hours. BNP (last 3 results) No results for input(s): PROBNP in the last 8760 hours. HbA1C: No results for input(s): HGBA1C in the last 72 hours. CBG: No results for input(s): GLUCAP in the last 168 hours. Lipid Profile: No results for input(s): CHOL, HDL, LDLCALC, TRIG, CHOLHDL, LDLDIRECT in the last 72 hours. Thyroid Function Tests: No results for input(s): TSH, T4TOTAL, FREET4, T3FREE, THYROIDAB in the last 72 hours. Anemia Panel: Recent Labs    01/26/17 1841  RETICCTPCT 2.4   Urine  analysis:    Component Value Date/Time   COLORURINE YELLOW 01/26/2017 1652   APPEARANCEUR HAZY (A) 01/26/2017 1652   LABSPEC 1.016 01/26/2017 1652   PHURINE 5.0 01/26/2017 1652   GLUCOSEU NEGATIVE 01/26/2017 1652   HGBUR MODERATE (A) 01/26/2017 [place] 01/26/2017 1652   KETONESUR NEGATIVE 01/26/2017 1652   PROTEINUR 100 (A) 01/26/2017 1652   UROBILINOGEN 0.2 04/23/2007 1113   NITRITE NEGATIVE 01/26/2017 1652   LEUKOCYTESUR MODERATE (A) 01/26/2017 1652   Sepsis Labs: !!!!!!!!!!!!!!!!!!!!!!!!!!!!!!!!!!!!!!!!!!!! @LABRCNTIP (procalcitonin:4,lacticidven:4) )No results found for this or any previous visit (from the past 240 hour(s)).   Radiological Exams on Admission: Ct Abdomen Pelvis Wo Contrast  Result Date: 01/26/2017 CLINICAL DATA:  Abdominal pain, most severe on the right. Fever. Headache. Fatigue. Vomiting. Anorexia. EXAM: CT ABDOMEN AND PELVIS WITHOUT CONTRAST TECHNIQUE: Multidetector CT imaging of the abdomen and pelvis was performed following the standard protocol without IV contrast. COMPARISON:  09/26/2015 CT abdomen/ pelvis. FINDINGS: Lower chest: Mild patchy tree-in-bud opacities in the anterior lower lobes bilaterally, largely new. Coronary atherosclerosis. Hepatobiliary: Normal liver size.  Granulomatous posterior right liver lobe calcification. No liver masses. Cholelithiasis. No gallbladder wall thickening or pericholecystic fluid. No biliary ductal dilatation. Pancreas: Normal, with no mass or duct dilation. Spleen: Normal size. No mass. Adrenals/Urinary Tract: Normal right adrenal. Mild thickening of the left adrenal gland without discrete left adrenal nodules, unchanged. No renal stones. No hydronephrosis. Simple 1.2 cm interpolar left renal cyst. Subcentimeter hypodense interpolar right renal cortical lesion is too small to characterize and is stable, considered benign. No new contour deforming renal masses. Normal bladder. Stomach/Bowel: Normal non-distended stomach. Normal caliber small bowel with no small bowel wall thickening. Normal appendix. Mild sigmoid diverticulosis, with no large bowel wall thickening or pericolonic fat stranding. Vascular/Lymphatic: Atherosclerotic abdominal aorta with stable 3.2 cm infrarenal abdominal aortic aneurysm. No pathologically enlarged lymph nodes in the abdomen or pelvis. Reproductive: Grossly normal uterus.  No adnexal mass. Other: No pneumoperitoneum, ascites or focal fluid collection. Musculoskeletal: No aggressive appearing focal osseous lesions. Stable advanced degenerative changes at the sacroiliac joints. Stable advanced degenerative disc disease in the mid to lower lumbar spine. IMPRESSION: 1. No acute abnormality in the abdomen or pelvis. No evidence of bowel obstruction or acute bowel inflammation. Mild sigmoid diverticulosis, with no evidence of acute diverticulitis. 2. Cholelithiasis, with no evidence of acute cholecystitis. 3. Mild patchy tree-in-bud opacities in the lower lobes, indicative of a nonspecific mild infectious or inflammatory bronchiolitis. 4. Stable 3.2 cm infrarenal abdominal aortic aneurysm. Recommend followup by ultrasound in 3 years. This recommendation follows ACR consensus guidelines: White Paper of the ACR Incidental  Findings Committee II on Vascular Findings. J Am Coll Radiol 2013; 15:176-160. 5.  Aortic Atherosclerosis (ICD10-I70.0). Electronically Signed   By: Ilona Sorrel M.D.   On: 01/26/2017 16:17   Dg Chest 2 View  Result Date: 01/26/2017 CLINICAL DATA:  Productive cough for several weeks EXAM: CHEST  2 VIEW COMPARISON:  11/12/2016 FINDINGS: The heart size and mediastinal contours are within normal limits. Both lungs are clear. The visualized skeletal structures are unremarkable. IMPRESSION: No active cardiopulmonary disease. Electronically Signed   By: Inez Catalina M.D.   On: 01/26/2017 16:27   EKG: Independently reviewed. Showed Sinus Tachycardia at a rate of 104; There was no evidence of ST Elevation or Depression on my interpretation.   Assessment/Plan Active Problems:   HTN (hypertension)   Hyperlipidemia   Tobacco abuse   Nicotine dependence   AAA (abdominal aortic aneurysm) ([place])  Emphysema lung (HCC)   Intractable nausea and vomiting   Generalized weakness   Fatigue   RUQ pain   Normocytic anemia   AKI (acute kidney injury) (HCC)  Intractable Nausea and Vomiting suspected from Viral Gastroenteritis r/o other causes -Admit to Telemetry -C/w Antiemetics with IV/po Zofran -S/p 500 mL bolus x 2 in ED; Given another Liter and started Maintenance  fluid at 100 mL/hr  -Lactic Acid Level reassuring at 0.58 -CT Abd/Pelvis showed No acute abnormality in the abdomen or pelvis. No evidence of bowel obstruction or acute bowel inflammation. Mild sigmoid diverticulosis, with no evidence of acute diverticulitis. -Clear Liquid Diet  Generalized Weakness and Fatigue -Likely from Anemia and Intractable N/V -Rehydrate as above -PT/OT Eval  Acute Kidney Injury in the setting of Dehydration from Intractable N/V -BUN/Cr elevated at 66/3.53 -Base Line Cr was 0.8 -Avoid Nephrotoxins if possible -C/w IVF Rehydration as above -Repeat CMP in AM  RUQ Pain -CT Abd/Pelvis showed Cholelithiasis, with  no evidence of acute cholecystitis -Check RUQ U/S -Lipase Level was 90; Not highly suspicious for Pancreatitis given imaging   Symptomatic Acute on Chronic Normocytic Anemia  -Last Hb/Hc was 14.6/43.0 in 2014 -Now presents with Hb/Hct of 6.7/20.5 -Check Anemia Panel; -ED Typed and Screened and are transfusing 2 units pRBC's -Check Haptoglobin, LDH, X52, Folic Acid -FOBT Negative -Discussed Case with Dr. Loletha Carrow who will see patient in AM -No S/Sx of Bleeding and not taking NSAIDs -Hold ASA and Plavix for now -Patient's U/A reveals some Hb in Urine Dipstick  -Repeat CBC in AM   COPD/Chronic Bronchitis -Not on Home Oxygen and not in exacerbation -CT Scan showed Mild patchy tree-in-bud opacities in the lower lobes, indicative of a nonspecific mild infectious or inflammatory bronchiolitis. -Continue to Monitor   Tobacco Abuse/Smoker -Patient Smokes 1PPD -Smoking Cessation Counseling given   HTN but BP on the Softer Side -Hold Home Antihypertensives for the time being -Patient takes Lisinopril 5 mg po daily, Metoprolol 25 mg po Daily -BP was 101/58  Hyperlipidemia -C/w Home Rosuvastain 40 mg po Daily and Omega-3 Fatty Acics  CAD s/p Stenting -Holding ASA and Plavix given Acute Anemia -Holding Lisinopril, Metoprolol, and NTG given Soft Blood Pressures -C/w Rosuvastatin and Omega 3 Fish Oil  AAA  -CT Abd Pelvis showed stable 3.2 cm infrarenal abdominal aortic aneurysm.  -Last evauation showed AAA was 3.1 -Recommend followup by ultrasound in 3 years.   DVT prophylaxis: SCDs Code Status: FULL CODE Family Communication: Discussed with Daughter at bedside  Disposition Plan: Anticipate Home at D/C Consults called: Discussed Case with Dr. Wilfrid Lund and will see patient tomorrow  Admission status: Inpatient  Severity of Illness: The appropriate patient status for this patient is INPATIENT. Inpatient status is judged to be reasonable and necessary in order to provide the required  intensity of service to ensure the patient's safety. The patient's presenting symptoms, physical exam findings, and initial radiographic and laboratory data in the context of their chronic comorbidities is felt to place them at high risk for further clinical deterioration. Furthermore, it is not anticipated that the patient will be medically stable for discharge from the hospital within 2 midnights of admission. The following factors support the patient status of inpatient.   " The patient's presenting symptoms include nausea and vomiting. " The worrisome physical exam findings include pallor and dry mucous membranes. " The initial radiographic and laboratory data are worrisome because of AKI and Anemia. " The chronic co-morbidities include HTN, HLD, CAD.  * I  certify that at the point of admission it is my clinical judgment that the patient will require inpatient hospital care spanning beyond 2 midnights from the point of admission due to high intensity of service, high risk for further deterioration and high frequency of surveillance required.Kerney Elbe, D.O. [facility] Pager [phone]  If 7PM-7AM, please contact night-coverage [email or web address] Password [facility]  01/26/2017, 7:28 PM

## 2017-01-26 NOTE — ED Notes (Signed)
Hgb of 6.7 reported to Dr. Jeanell Sparrow

## 2017-01-27 ENCOUNTER — Inpatient Hospital Stay (HOSPITAL_COMMUNITY): Payer: Medicare Other

## 2017-01-27 DIAGNOSIS — R112 Nausea with vomiting, unspecified: Secondary | ICD-10-CM

## 2017-01-27 DIAGNOSIS — D649 Anemia, unspecified: Secondary | ICD-10-CM

## 2017-01-27 DIAGNOSIS — R0602 Shortness of breath: Secondary | ICD-10-CM

## 2017-01-27 DIAGNOSIS — R109 Unspecified abdominal pain: Secondary | ICD-10-CM

## 2017-01-27 LAB — BPAM RBC
BLOOD PRODUCT EXPIRATION DATE: 201901252359
BLOOD PRODUCT EXPIRATION DATE: 201901252359
Blood Product Expiration Date: 201901252359
Blood Product Expiration Date: 201901252359
ISSUE DATE / TIME: 201812292133
ISSUE DATE / TIME: 201812292133
ISSUE DATE / TIME: 201812300001
ISSUE DATE / TIME: 201812300001
UNIT TYPE AND RH: 5100
UNIT TYPE AND RH: 5100
Unit Type and Rh: 5100
Unit Type and Rh: 5100

## 2017-01-27 LAB — PROTIME-INR
INR: 1.16
Prothrombin Time: 14.7 seconds (ref 11.4–15.2)

## 2017-01-27 LAB — COMPREHENSIVE METABOLIC PANEL
ALT: 15 U/L (ref 14–54)
AST: 23 U/L (ref 15–41)
Albumin: 2.8 g/dL — ABNORMAL LOW (ref 3.5–5.0)
Alkaline Phosphatase: 78 U/L (ref 38–126)
Anion gap: 8 (ref 5–15)
BUN: 62 mg/dL — AB (ref 6–20)
CHLORIDE: 112 mmol/L — AB (ref 101–111)
CO2: 24 mmol/L (ref 22–32)
CREATININE: 2.91 mg/dL — AB (ref 0.44–1.00)
Calcium: 9 mg/dL (ref 8.9–10.3)
GFR calc Af Amer: 18 mL/min — ABNORMAL LOW (ref >60)
GFR calc non Af Amer: 16 mL/min — ABNORMAL LOW (ref >60)
Glucose, Bld: 77 mg/dL (ref 65–99)
Potassium: 5 mmol/L (ref 3.5–5.1)
SODIUM: 144 mmol/L (ref 135–145)
Total Bilirubin: 0.8 mg/dL (ref 0.3–1.2)
Total Protein: 6.7 g/dL (ref 6.5–8.1)

## 2017-01-27 LAB — CBC
HCT: 26.3 % — ABNORMAL LOW (ref 36.0–46.0)
Hemoglobin: 8.8 g/dL — ABNORMAL LOW (ref 12.0–15.0)
MCH: 31.9 pg (ref 26.0–34.0)
MCHC: 33.5 g/dL (ref 30.0–36.0)
MCV: 95.3 fL (ref 78.0–100.0)
PLATELETS: 250 10*3/uL (ref 150–400)
RBC: 2.76 MIL/uL — ABNORMAL LOW (ref 3.87–5.11)
RDW: 15.8 % — AB (ref 11.5–15.5)
WBC: 5.8 10*3/uL (ref 4.0–10.5)

## 2017-01-27 LAB — SODIUM, URINE, RANDOM: Sodium, Ur: 71 mmol/L

## 2017-01-27 LAB — CREATININE, URINE, RANDOM: Creatinine, Urine: 32.16 mg/dL

## 2017-01-27 LAB — GLUCOSE, CAPILLARY: Glucose-Capillary: 74 mg/dL (ref 65–99)

## 2017-01-27 LAB — HIV ANTIBODY (ROUTINE TESTING W REFLEX): HIV SCREEN 4TH GENERATION: NONREACTIVE

## 2017-01-27 LAB — HAPTOGLOBIN: Haptoglobin: 384 mg/dL — ABNORMAL HIGH (ref 34–200)

## 2017-01-27 LAB — APTT: aPTT: 26 seconds (ref 24–36)

## 2017-01-27 MED ORDER — SODIUM CHLORIDE 0.9 % IV SOLN
INTRAVENOUS | Status: DC
  Administered 2017-01-27: 11:00:00 via INTRAVENOUS

## 2017-01-27 NOTE — Progress Notes (Signed)
PROGRESS NOTE    Kaitlin [name]  DXA:[id number] DOB: [birth year] DOA: 01/26/2017 PCP: Raylene Everts, MD   Brief Narrative:  Kaitlin [name] is a 66 y.o. female with medical history significant of CAD s/p Stenting, COPD, GERD, HTN, HLD, Normocytic Anemia, PAD, and other comorbids who presented to [facility] with a cc of on and off nausea and vomiting with an inability to keep any food down. She had some Right upper quadrant pain as well and has been feeling lightheaded and dizzy when walking. No SOB. Denies any blood in urine and stool and states her vomit contains no blood. Has not been taking NSAIDs. States she vomits sometimes minutes after she eats and feels full after eating. Was seen at Urgent Care and referred to ED for evaluation. TRH was consulted for Intractable N/V and Symptomatic Anemia.  Assessment & Plan:   Active Problems:   HTN (hypertension)   Hyperlipidemia   Tobacco abuse   Nicotine dependence   AAA (abdominal aortic aneurysm) (HCC)   Emphysema lung (HCC)   Intractable nausea and vomiting   Generalized weakness   Fatigue   RUQ pain   Normocytic anemia   AKI (acute kidney injury) ([facility])  Intractable Nausea and Vomiting suspected from Viral Gastroenteritis r/o other causes as patient is having Early Satiety and Vomiting postprandially  -Admitted to Telemetry -C/w Antiemetics with IV/po Zofran -S/p 500 mL bolus x 2 in ED; Given another 1 Liter and started Maintenance  fluid at 100 mL/hr; C/w Maintenance  -Lactic Acid Level reassuring at 0.58 -CT Abd/Pelvis showed No acute abnormality in the abdomen or pelvis. No evidence of bowel obstruction or acute bowel inflammation. Mild sigmoid diverticulosis, with no evidence of acute diverticulitis. -Gastroenterology Consulted and evaluating and planning on pursuing upper EGD due to N/V, fullness, and bloating after eating even small bites.  -Clear Liquid Diet advanced to Soft Today and NPO at [place] for EGD   -Per GI,  further plans to follow after EGD  Generalized Weakness and Fatigue, improving  -Likely from Anemia and Intractable N/V -Rehydrate as above with NS at 100 mL/hr -PT/OT Eval recommending no PT/OT Follow up   Acute Kidney Injury likely in the setting of Dehydration from Intractable N/V; ?CKD slightly improving  -BUN/Cr elevated at 66/3.53 -Base Line Cr was 0.8 back a few years ago -Avoid Nephrotoxins if possible -Urinalysis showed Hazy Appearance, Moderate Hb in Urine dipstick, Moderate Leukocytes, 100 Protein, 0-5 RBC, and 6-30 WBC -Check FENa; Was 4.5% and Indicating Post-Obstructive? -CT Scan of Abd/Pelvis did not show any abnormalities  -Checking Renal Ultrasound  -BUN/Cr improved to 62/2.91 -C/w IVF Rehydration as above with NS at 100 mL/hr -Repeat CMP in AM -If not improving may consider Inpatient Renal Consultation   RUQ Pain, improved  -CT Abd/Pelvis showed Cholelithiasis, with no evidence of acute cholecystitis -Checked RUQ U/S and showed cholelithiasis without sonographic findings for acute cholecystitis, normal caliber CBD, and Normal Liver -Lipase Level was 90; Not highly suspicious for Pancreatitis given imaging   Symptomatic Acute on Chronic Normocytic Anemia  -Last Hb/Hc was 14.6/43.0 in 2014 -Now presents with Hb/Hct of 6.7/20.5 on admission  -Checked Anemia Panel but do not know if blood was given before or after it was checked; Iron Level was 92, UBIC was 238, TIBC was 330, Saturation Ratios was 28, Ferritin was 256, Folate was 198, B12 was 198; -Reticulocyte Count Normal and Coagulation Studies Normal -ED Typed and Screened and are transfused 2 units pRBC's -Check Haptoglobin pending,  LDH was slightly elevated at 245 -FOBT Negative -Hb/Hct improved to 8.8/26.3 after 2 units of pRBC's -Discussed Case with Dr. Loletha Carrow who will see patient in AM -No S/Sx of Bleeding and not taking NSAIDs -Hold ASA and Plavix for now -Patient's U/A reveals some Hb in Urine Dipstick    -Repeat CBC in AM   COPD/Chronic Bronchitis -Not on Home Oxygen and not in exacerbation -CT Scan showed Mild patchy tree-in-bud opacities in the lower lobes, indicative of a nonspecific mild infectious or inflammatory bronchiolitis. -Afebrile and no Leukocytosis -Continue to Monitor  -Repeat CXR in AM   Tobacco Abuse/Smoker -Patient Smokes 1PPD -Smoking Cessation Counseling given   HTN but BP on the Softer Side -Hold Home Antihypertensives for the time being -Patient takes Lisinopril 5 mg po daily, Metoprolol 25 mg po Daily -BP was 108/67 this AM   Hyperlipidemia -C/w Home Rosuvastain 40 mg po Daily and Omega-3 Fatty Acics  CAD s/p Stenting -Holding ASA and Plavix given Acute Anemia and EGD in AM -Continue holding Lisinopril, Metoprolol, and NTG given Soft Blood Pressures -C/w Rosuvastatin and Omega 3 Fish Oil  AAA  -CT Abd Pelvis showed stable 3.2 cm infrarenal abdominal aortic aneurysm.  -Last evauation showed AAA was 3.1 -Recommend followup by ultrasound in 3 years.   Decreased TSH -TSH was 0.282 -? Hyperthyroidism -Check Free T4 and T3  DVT prophylaxis: SCDs Code Status: FULL CODE Family Communication: No family present at bedside Disposition Plan: Remain Inpatient for EGD in AM  Consultants:   Gastroenterology Dr. Loletha Carrow   Procedures:  None   Antimicrobials:  Anti-infectives (From admission, onward)   None     Subjective: Seen and examined at bedside and was feeling better. No CP or SOB. No nausea and was able to tolerate Clears and eat some jello. No other complaints or concerns at this time.   Objective: Vitals:   01/27/17 0025 01/27/17 0040 01/27/17 0323 01/27/17 0448  BP: 97/60 97/66 108/67   Pulse: 84 94 80   Resp: 16 17 15    Temp: 97.7 F (36.5 C) 98 F (36.7 C) (!) 97.4 F (36.3 C)   TempSrc: Oral Oral Oral   SpO2:  97% 97%   Weight:    55.5 kg (122 lb 4.8 oz)  Height:    5\' 1"  (1.549 m)    Intake/Output Summary (Last 24  hours) at 01/27/2017 0846 Last data filed at 01/27/2017 0600 Gross per 24 hour  Intake 1966 ml  Output 750 ml  Net 1216 ml   Filed Weights   01/27/17 0448  Weight: 55.5 kg (122 lb 4.8 oz)   Examination: Physical Exam:  Constitutional: Thin Caucasian female in NAD and appears calm and comfortable Eyes: Lids and conjunctivae normal, sclerae anicteric  ENMT: External Ears, Nose appear normal. Grossly normal hearing. Neck: Appears normal, supple, no cervical masses, normal ROM, no appreciable thyromegaly; no JVD Respiratory: Clear to auscultation bilaterally, no wheezing, rales, rhonchi or crackles. Normal respiratory effort and patient is not tachypenic. No accessory muscle use.  Cardiovascular: RRR, no murmurs / rubs / gallops. S1 and S2 auscultated. No extremity edema.  Abdomen: Soft, non-tender, non-distended. No masses palpated. Bowel sounds positive x4.   GU: Deferred. Musculoskeletal: No clubbing / cyanosis of digits/nails. No joint deformity upper and lower extremities.  Skin: No rashes, lesions, ulcers on a limited skin eval. No induration; Warm and dry.  Neurologic: CN 2-12 grossly intact with no focal deficits.  Strength 5/5 in all 4. Romberg sign and cerebellar  reflexes not assessed.  Psychiatric: Normal judgment and insight. Alert and oriented x 3. Normal mood and appropriate affect.   Data Reviewed: I have personally reviewed following labs and imaging studies  CBC: Recent Labs  Lab 01/26/17 1400 01/27/17 0401  WBC 8.8 5.8  HGB 6.7* 8.8*  HCT 20.5* 26.3*  MCV 98.1 95.3  PLT 353 161   Basic Metabolic Panel: Recent Labs  Lab 01/26/17 1400 01/27/17 0401  NA 139 144  K 4.5 5.0  CL 103 112*  CO2 25 24  GLUCOSE 120* 77  BUN 66* 62*  CREATININE 3.53* 2.91*  CALCIUM 9.3 9.0   GFR: Estimated Creatinine Clearance: 14.5 mL/min (A) (by C-G formula based on SCr of 2.91 mg/dL (H)). Liver Function Tests: Recent Labs  Lab 01/26/17 1400 01/27/17 0401  AST 28 23    ALT 17 15  ALKPHOS 88 78  BILITOT 0.8 0.8  PROT 7.8 6.7  ALBUMIN 3.2* 2.8*   Recent Labs  Lab 01/26/17 1400  LIPASE 90*   No results for input(s): AMMONIA in the last 168 hours. Coagulation Profile: Recent Labs  Lab 01/27/17 0401  INR 1.16   Cardiac Enzymes: No results for input(s): CKTOTAL, CKMB, CKMBINDEX, TROPONINI in the last 168 hours. BNP (last 3 results) No results for input(s): PROBNP in the last 8760 hours. HbA1C: Recent Labs    01/26/17 1900  HGBA1C 5.2   CBG: Recent Labs  Lab 01/27/17 0743  GLUCAP 74   Lipid Profile: No results for input(s): CHOL, HDL, LDLCALC, TRIG, CHOLHDL, LDLDIRECT in the last 72 hours. Thyroid Function Tests: Recent Labs    01/26/17 1908  TSH 0.282*   Anemia Panel: Recent Labs    01/26/17 1841  VITAMINB12 198  FOLATE 13.5  FERRITIN 256  TIBC 330  IRON 92  RETICCTPCT 2.4   Sepsis Labs: Recent Labs  Lab 01/26/17 1421 01/26/17 1642  LATICACIDVEN 1.08 0.58    No results found for this or any previous visit (from the past 240 hour(s)).   Radiology Studies: Ct Abdomen Pelvis Wo Contrast  Result Date: 01/26/2017 CLINICAL DATA:  Abdominal pain, most severe on the right. Fever. Headache. Fatigue. Vomiting. Anorexia. EXAM: CT ABDOMEN AND PELVIS WITHOUT CONTRAST TECHNIQUE: Multidetector CT imaging of the abdomen and pelvis was performed following the standard protocol without IV contrast. COMPARISON:  09/26/2015 CT abdomen/ pelvis. FINDINGS: Lower chest: Mild patchy tree-in-bud opacities in the anterior lower lobes bilaterally, largely new. Coronary atherosclerosis. Hepatobiliary: Normal liver size. Granulomatous posterior right liver lobe calcification. No liver masses. Cholelithiasis. No gallbladder wall thickening or pericholecystic fluid. No biliary ductal dilatation. Pancreas: Normal, with no mass or duct dilation. Spleen: Normal size. No mass. Adrenals/Urinary Tract: Normal right adrenal. Mild thickening of the left  adrenal gland without discrete left adrenal nodules, unchanged. No renal stones. No hydronephrosis. Simple 1.2 cm interpolar left renal cyst. Subcentimeter hypodense interpolar right renal cortical lesion is too small to characterize and is stable, considered benign. No new contour deforming renal masses. Normal bladder. Stomach/Bowel: Normal non-distended stomach. Normal caliber small bowel with no small bowel wall thickening. Normal appendix. Mild sigmoid diverticulosis, with no large bowel wall thickening or pericolonic fat stranding. Vascular/Lymphatic: Atherosclerotic abdominal aorta with stable 3.2 cm infrarenal abdominal aortic aneurysm. No pathologically enlarged lymph nodes in the abdomen or pelvis. Reproductive: Grossly normal uterus.  No adnexal mass. Other: No pneumoperitoneum, ascites or focal fluid collection. Musculoskeletal: No aggressive appearing focal osseous lesions. Stable advanced degenerative changes at the sacroiliac joints. Stable advanced degenerative  disc disease in the mid to lower lumbar spine. IMPRESSION: 1. No acute abnormality in the abdomen or pelvis. No evidence of bowel obstruction or acute bowel inflammation. Mild sigmoid diverticulosis, with no evidence of acute diverticulitis. 2. Cholelithiasis, with no evidence of acute cholecystitis. 3. Mild patchy tree-in-bud opacities in the lower lobes, indicative of a nonspecific mild infectious or inflammatory bronchiolitis. 4. Stable 3.2 cm infrarenal abdominal aortic aneurysm. Recommend followup by ultrasound in 3 years. This recommendation follows ACR consensus guidelines: White Paper of the ACR Incidental Findings Committee II on Vascular Findings. J Am Coll Radiol 2013; 68:616-837. 5.  Aortic Atherosclerosis (ICD10-I70.0). Electronically Signed   By: Ilona Sorrel M.D.   On: 01/26/2017 16:17   Dg Chest 2 View  Result Date: 01/26/2017 CLINICAL DATA:  Productive cough for several weeks EXAM: CHEST  2 VIEW COMPARISON:  11/12/2016  FINDINGS: The heart size and mediastinal contours are within normal limits. Both lungs are clear. The visualized skeletal structures are unremarkable. IMPRESSION: No active cardiopulmonary disease. Electronically Signed   By: Inez Catalina M.D.   On: 01/26/2017 16:27   US Abdomen Limited Ruq  Result Date: 01/26/2017 CLINICAL DATA:  Abdominal pain and vomiting for 2 weeks. EXAM: ULTRASOUND ABDOMEN LIMITED RIGHT UPPER QUADRANT COMPARISON:  CT scan 01/26/2017 FINDINGS: Gallbladder: Echogenic shadowing gallstone noted in the gallbladder and a small amount of gallbladder sludge. No gallbladder wall thickening, pericholecystic fluid or sonographic Murphy sign to suggest acute cholecystitis. Common bile duct: Diameter: 2.8 mm Liver: Normal echogenicity without focal lesion or biliary dilatation. Portal vein is patent on color Doppler imaging with normal direction of blood flow towards the liver. IMPRESSION: 1. Cholelithiasis without sonographic findings for acute cholecystitis. 2. Normal caliber common bile duct. 3. Normal liver. Electronically Signed   By: Marijo Sanes M.D.   On: 01/26/2017 21:18   Scheduled Meds: . omega-3 acid ethyl esters  1 g Oral Daily  . rosuvastatin  40 mg Oral Daily   Continuous Infusions: . 0.9 % NaCl with KCl 20 mEq / L 100 mL/hr at 01/27/17 0330  . sodium chloride      LOS: 1 day   Kerney Elbe, DO [facility] Pager [phone]  If 7PM-7AM, please contact night-coverage [email or web address] Password [email or web address] 01/27/2017, 8:46 AM

## 2017-01-27 NOTE — Progress Notes (Signed)
Occupational Therapy Evaluation and Discharge Patient Details Name: Kaitlin [name] MRN: [id number] DOB: 1950/12/17 Today's Date: 01/27/2017    History of Present Illness Pt is a 66 y.o. female with medical history significant of CAD s/p Stenting, COPD, GERD, HTN, HLD, Normocytic Anemia, PAD, and other comorbids. She presented to [facility] with a cc of on and off nausea and vomiting.    Clinical Impression   PTA Pt independent in ADL and functional transfers. Pt works part time doing canning/preserves - also enjoys taking care of great grandchildren. Pt is independent for all ADL upon assessment. Pt able to perform LB dressing, transfers, sink level grooming, and requested walk - did a whole lap of unit. NO questions or concerns at the end of session for OT. OT to sign off at this time. Education complete. Thank you for the opportunity to serve this patient.     Follow Up Recommendations  No OT follow up    Equipment Recommendations  None recommended by OT    Recommendations for Other Services       Precautions / Restrictions Precautions Precautions: None Restrictions Weight Bearing Restrictions: No      Mobility Bed Mobility Overal bed mobility: Independent                Transfers Overall transfer level: Independent Equipment used: None                  Balance Overall balance assessment: No apparent balance deficits (not formally assessed)                                         ADL either performed or assessed with clinical judgement   ADL Overall ADL's : Independent                                             Vision Patient Visual Report: No change from baseline Vision Assessment?: No apparent visual deficits     Perception     Praxis      Pertinent Vitals/Pain Pain Assessment: No/denies pain     Hand Dominance Right   Extremity/Trunk Assessment Upper Extremity Assessment Upper Extremity Assessment:  Overall WFL for tasks assessed   Lower Extremity Assessment Lower Extremity Assessment: Overall WFL for tasks assessed   Cervical / Trunk Assessment Cervical / Trunk Assessment: Normal   Communication Communication Communication: No difficulties   Cognition Arousal/Alertness: Awake/alert Behavior During Therapy: WFL for tasks assessed/performed Overall Cognitive Status: Within Functional Limits for tasks assessed                                     General Comments       Exercises     Shoulder Instructions      Home Living Family/patient expects to be discharged to:: Private residence Living Arrangements: Spouse/significant other Available Help at Discharge: Family;Available 24 hours/day Type of Home: Mobile home Home Access: Stairs to enter Entrance Stairs-Number of Steps: 3 Entrance Stairs-Rails: None Home Layout: One level     Bathroom Shower/Tub: Teacher, early years/pre: Standard     Home Equipment: None          Prior Functioning/Environment Level of  Independence: Independent        Comments: Works part-time job outside the home.         OT Problem List:        OT Treatment/Interventions:      OT Goals(Current goals can be found in the care plan section) Acute Rehab OT Goals Patient Stated Goal: home OT Goal Formulation: With patient Time For Goal Achievement: 02/03/17 Potential to Achieve Goals: Good  OT Frequency:     Barriers to D/C:            Co-evaluation              AM-PAC PT "6 Clicks" Daily Activity     Outcome Measure Help from another person eating meals?: None Help from another person taking care of personal grooming?: None Help from another person toileting, which includes using toliet, bedpan, or urinal?: None Help from another person bathing (including washing, rinsing, drying)?: None Help from another person to put on and taking off regular upper body clothing?: None Help from another  person to put on and taking off regular lower body clothing?: None 6 Click Score: 24   End of Session Equipment Utilized During Treatment: Gait belt Nurse Communication: Mobility status(IV came out while washing hands at sink)  Activity Tolerance: Patient tolerated treatment well Patient left: in bed;with call bell/phone within reach;with nursing/sitter in room                   Time: 1225-1251 OT Time Calculation (min): 26 min Charges:  OT General Charges $OT Visit: 1 Visit OT Evaluation $OT Eval Low Complexity: 1 Low OT Treatments $Self Care/Home Management : 8-22 mins G-Codes:     Hulda Humphrey OTR/L [phone]  Merri Ray Moana [place] 01/27/2017, 1:32 PM

## 2017-01-27 NOTE — H&P (View-Only) (Signed)
[facility] Consult: 8:15 AM 01/27/2017  LOS: 1 day    Referring Provider: Dr Alfredia Ferguson  Primary Care Physician:  Raylene Everts, MD in [place] Primary Gastroenterologist:  unassigned     Reason for Consultation:  FOBT negative anemia.     HPI: Kaitlin [name] is a 66 y.o. female.  On plavix, low dose ASA for hx CAD stent 2007.  PAD, s/p CEA; vertebral artery and iliac artery dz. COPD. Got flu shot in 09/2016. For about 10 days feeling like she had flu: malaise, sweats, body aches, tired, queasy, anorexia, nonpurulent productive cough.  Went to Urgent care in [facility] 12/24 got Rx for BID abx for "sinus infection".  sxs persisted, progressed to non-bloody post prandial N/V.  Went to different UC yesterday, got Phenergan shot and told to go to ED. Came to [facility] ED where labs were drawn (had not been done before)  Hgb 6.7 >> 8.8 post 2 U PRBC.  Was 14.6 in 2014.  FOBT negative.   AKI with GFR 13.  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  Normal renal fx in 03/2014.   Lipase 90 but LFTs normal. Anemia studies show normal iron, iron binding capacity, saturations.  Ferritin, folate normal.  B12 level low normal. CXR normal.  CT abdomen pelvis shows uncomplicated cholelithiasis.  3.2 cm infrarenal AAA.  Patchy lower lobe opacities, ? Infectious or inflammatory broncholitis.    Prior to this recent illness patient says she had little in the way of digestive problems.  With specific foods she would get water brash and reflux.  She used to take acid controlling medication but the symptoms were so rare that she stopped using it.  She might use 1 or 2 Aleve every couple of months.  Stools are brown and occur daily.  Generally has a good appetite, no dysphagia.  No abdominal pain.  No unusual aspects to urination.  No weight  loss or gain. She was referred to a Dr. Dyann Ruddle in [place] last year.  Her recall was that this was in regards to something to do with her kidneys.  He told her she had a kidney stone.  When I research his name that he is an Administrator, Civil Service working out of [facility] but apparently has an office at [facility] [facility] in [place].    Family history negative for anemia, ulcer disease, colorectal cancer, kidney disease. Patient smokes 1 pack/day, she last smoked more than a week ago.  She does not drink alcohol.  Her last Plavix dose was probably on 12/28 but she vomited this up.  Past Medical History:  Diagnosis Date  . Arthritis   . CAD (coronary artery disease)    stents  . COPD (chronic obstructive pulmonary disease) ([place])   . Emphysema of lung ([place])   . GERD (gastroesophageal reflux disease)   . Hyperlipidemia   . Hypertension   . Iliac artery stenosis, right ([facility])    60%-70%  . Myocardial infarction ([place])   . Normocytic anemia 01/26/2017  . PAD (peripheral artery disease) ([place]) 12/14/2010  in the left vertebral, bilateral carotids    Past Surgical History:  Procedure Laterality Date  . CARDIAC CATHETERIZATION  02/04/2005   A cypher 5.3 x 18 mm at 16 atmosphere of pressure in the mid LAD, this stent covered the proximal part of the LAD and also the mid LAD, this was then post dilated with 3.25 x 15 mm Quantum at 16 atmosphereof pressure for 45 seconds  . TUBAL LIGATION      Prior to Admission medications   Medication Sig Start Date End Date Taking? Authorizing Provider  aspirin 81 MG tablet Take 81 mg by mouth daily.    [provider]  clopidogrel (PLAVIX) 75 MG tablet TAKE ONE (1) TABLET BY MOUTH EVERY DAY 12/28/16   Arnoldo Lenis, MD  fish oil-omega-3 fatty acids 1000 MG capsule Take 1 g by mouth daily.    [provider]  lisinopril (PRINIVIL,ZESTRIL) 5 MG tablet TAKE ONE-HALF TABLET BY MOUTH DAILY. 12/28/16   Arnoldo Lenis, MD  metoprolol  succinate (TOPROL-XL) 25 MG 24 hr tablet TAKE ONE-HALF TABLET BY MOUTH DAILY. 12/28/16   Arnoldo Lenis, MD  nitroGLYCERIN (NITROSTAT) 0.4 MG SL tablet Place 1 tablet (0.4 mg total) under the tongue every 5 (five) minutes as needed for chest pain. 10/09/13   Arnoldo Lenis, MD  rosuvastatin (CRESTOR) 40 MG tablet TAKE ONE (1) TABLET BY MOUTH EVERY DAY 12/28/16   Arnoldo Lenis, MD    Scheduled Meds: . omega-3 acid ethyl esters  1 g Oral Daily  . rosuvastatin  40 mg Oral Daily   Infusions: . 0.9 % NaCl with KCl 20 mEq / L 100 mL/hr at 01/27/17 0330  . sodium chloride     PRN Meds: acetaminophen **OR** acetaminophen, ondansetron **OR** ondansetron (ZOFRAN) IV, polyethylene glycol, traMADol   Allergies as of 01/26/2017  . (No Known Allergies)    Family History  Problem Relation Age of Onset  . Heart attack Mother   . Hyperlipidemia Mother   . Hypertension Mother   . Diabetes Mother   . Heart attack Father   . Early death Father 66  . Hyperlipidemia Father   . Hypertension Father   . Heart disease Sister   . Hyperlipidemia Sister   . Hypertension Sister   . Diabetes Brother   . Heart disease Sister   . Hyperlipidemia Sister   . Hypertension Sister   . Heart attack Sister   . Heart disease Sister   . Hyperlipidemia Sister   . Hypertension Sister   . Heart attack Brother   . Early death Brother 59  . Hyperlipidemia Brother   . Hypertension Brother   . Heart attack Brother   . Early death Brother 37  . Hyperlipidemia Brother   . Hypertension Brother   . Cancer Maternal Aunt     Social History   Socioeconomic History  . Marital status: Married    Spouse name: Marcello Moores  . Number of children: 3  . Years of education: 25  . Highest education level: Not on file  Social Needs  . Financial resource strain: Not on file  . Food insecurity - worry: Not on file  . Food insecurity - inability: Not on file  . Transportation needs - medical: Not on file  .  Transportation needs - non-medical: Not on file  Occupational History  . Occupation: RETIRED    Comment: warehouse/textiles  Tobacco Use  . Smoking status: Current Every Day Smoker    Packs/day: 1.00  Years: 49.00    Pack years: 49.00    Types: Cigarettes    Start date: 02/09/1967  . Smokeless tobacco: Former Network engineer and Sexual Activity  . Alcohol use: No  . Drug use: No  . Sexual activity: Yes    Birth control/protection: Post-menopausal  Other Topics Concern  . Not on file  Social History Narrative   Retired   Has an [employer] in [employer] with husband Marcello Moores for 23 years   Stays busy with home, gardens, likes to can    REVIEW OF SYSTEMS: Constitutional:  Per HPI ENT:  No nose bleeds Pulm:  Per HPI.  Denies issues with climbing stairs or moderate exertion causing shortness of breath CV:  No palpitations, no LE edema.  No chest pain.  No syncope. GU:  No hematuria, no frequency, no oliguria. GI:  Per HPI Heme: No previous issues with anemia.  No previous iron supplementation or transfusions.  No unusual or excessive bleeding or bruising Transfusions: None until the last 24 hours Neuro:  No headaches.  No seizures.  No tremors.  Has positionally related numbness and tingling in her hands which she can relieve when she moves her arms Derm:  No itching, no rash or sores.  Endocrine:  No sweats or chills.  No polyuria or dysuria Immunization: Flu shot 09/2016. Travel:  None beyond local counties in last few months.    PHYSICAL EXAM: Vital signs in last 24 hours: Vitals:   01/27/17 0040 01/27/17 0323  BP: 97/66 108/67  Pulse: 94 80  Resp: 17 15  Temp: 98 F (36.7 C) (!) 97.4 F (36.3 C)  SpO2: 97% 97%   Wt Readings from Last 3 Encounters:  01/27/17 55.5 kg (122 lb 4.8 oz)  11/26/16 57.2 kg (126 lb 1.9 oz)  10/25/16 58.1 kg (128 lb)    General: Pleasant.  Looks tired but does not appear ill.  Comfortable. Head: No facial asymmetry or swelling.  No signs of  head trauma. Eyes: Slight conjunctival pallor.  No icterus.  EOMI. Ears: Not hard of hearing Nose: No sneezing, no drainage.  Does not sound congested. Mouth: Moist, pink, clear oral mucosa.  Tongue midline.  Edentulous.  Upper dentures in place. Neck: No mass.  No JVD.  No thyromegaly. Lungs: Diminished breath sounds but clear overall.  Loose cough with foamy white sputum.  No dyspnea. Heart: RRR.  No MRG.  S1, S2 present. Abdomen: Nontender, soft, nondistended.  No organomegaly, bruits, hernias.  Bowel sounds active..   Rectal: Did not repeat rectal exam.  Stool yesterday was FOBT negative. Musc/Skeltl: No joint redness, swelling or gross deformity. Extremities: No CCE.  2+ pedal pulses bilaterally.  Feet warm. Neurologic: Alert.  Oriented x3.  Moves all 4 limbs with grossly normal strength.  No tremor. Skin: No rashes, sores, telangiectasia. Tattoos: None Nodes: No cervical adenopathy. Psych: Cooperative, pleasant, calm.  Intake/Output from previous day: 12/29 0701 - 12/30 0700 In: 1965 [P.O.:480; I.V.:330; Blood:631; IV Piggyback:525] Out: 750 [Urine:750] Intake/Output this shift: No intake/output data recorded.  LAB RESULTS: Recent Labs    01/26/17 1400 01/27/17 0401  WBC 8.8 5.8  HGB 6.7* 8.8*  HCT 20.5* 26.3*  PLT 353 250   BMET Lab Results  Component Value Date   NA 144 01/27/2017   NA 139 01/26/2017   NA 140 04/09/2014   K 5.0 01/27/2017   K 4.5 01/26/2017   K 4.9 04/09/2014   CL 112 (H) 01/27/2017  CL 103 01/26/2017   CL 106 04/09/2014   CO2 24 01/27/2017   CO2 25 01/26/2017   CO2 24 04/09/2014   GLUCOSE 77 01/27/2017   GLUCOSE 120 (H) 01/26/2017   GLUCOSE 76 04/09/2014   BUN 62 (H) 01/27/2017   BUN 66 (H) 01/26/2017   BUN 17 04/09/2014   CREATININE 2.91 (H) 01/27/2017   CREATININE 3.53 (H) 01/26/2017   CREATININE 1.03 04/09/2014   CALCIUM 9.0 01/27/2017   CALCIUM 9.3 01/26/2017   CALCIUM 9.1 04/09/2014   LFT Recent Labs    01/26/17 1400  01/27/17 0401  PROT 7.8 6.7  ALBUMIN 3.2* 2.8*  AST 28 23  ALT 17 15  ALKPHOS 88 78  BILITOT 0.8 0.8   PT/INR Lab Results  Component Value Date   INR 1.16 01/27/2017   INR 0.9 04/23/2007   Hepatitis Panel No results for input(s): HEPBSAG, HCVAB, HEPAIGM, HEPBIGM in the last 72 hours. C-Diff No components found for: CDIFF Lipase     Component Value Date/Time   LIPASE 90 (H) 01/26/2017 1400    Drugs of Abuse  No results found for: LABOPIA, COCAINSCRNUR, LABBENZ, AMPHETMU, THCU, LABBARB   RADIOLOGY STUDIES: Ct Abdomen Pelvis Wo Contrast  Result Date: 01/26/2017 CLINICAL DATA:  Abdominal pain, most severe on the right. Fever. Headache. Fatigue. Vomiting. Anorexia. EXAM: CT ABDOMEN AND PELVIS WITHOUT CONTRAST TECHNIQUE: Multidetector CT imaging of the abdomen and pelvis was performed following the standard protocol without IV contrast. COMPARISON:  09/26/2015 CT abdomen/ pelvis. FINDINGS: Lower chest: Mild patchy tree-in-bud opacities in the anterior lower lobes bilaterally, largely new. Coronary atherosclerosis. Hepatobiliary: Normal liver size. Granulomatous posterior right liver lobe calcification. No liver masses. Cholelithiasis. No gallbladder wall thickening or pericholecystic fluid. No biliary ductal dilatation. Pancreas: Normal, with no mass or duct dilation. Spleen: Normal size. No mass. Adrenals/Urinary Tract: Normal right adrenal. Mild thickening of the left adrenal gland without discrete left adrenal nodules, unchanged. No renal stones. No hydronephrosis. Simple 1.2 cm interpolar left renal cyst. Subcentimeter hypodense interpolar right renal cortical lesion is too small to characterize and is stable, considered benign. No new contour deforming renal masses. Normal bladder. Stomach/Bowel: Normal non-distended stomach. Normal caliber small bowel with no small bowel wall thickening. Normal appendix. Mild sigmoid diverticulosis, with no large bowel wall thickening or pericolonic  fat stranding. Vascular/Lymphatic: Atherosclerotic abdominal aorta with stable 3.2 cm infrarenal abdominal aortic aneurysm. No pathologically enlarged lymph nodes in the abdomen or pelvis. Reproductive: Grossly normal uterus.  No adnexal mass. Other: No pneumoperitoneum, ascites or focal fluid collection. Musculoskeletal: No aggressive appearing focal osseous lesions. Stable advanced degenerative changes at the sacroiliac joints. Stable advanced degenerative disc disease in the mid to lower lumbar spine. IMPRESSION: 1. No acute abnormality in the abdomen or pelvis. No evidence of bowel obstruction or acute bowel inflammation. Mild sigmoid diverticulosis, with no evidence of acute diverticulitis. 2. Cholelithiasis, with no evidence of acute cholecystitis. 3. Mild patchy tree-in-bud opacities in the lower lobes, indicative of a nonspecific mild infectious or inflammatory bronchiolitis. 4. Stable 3.2 cm infrarenal abdominal aortic aneurysm. Recommend followup by ultrasound in 3 years. This recommendation follows ACR consensus guidelines: White Paper of the ACR Incidental Findings Committee II on Vascular Findings. J Am Coll Radiol 2013; 41:660-630. 5.  Aortic Atherosclerosis (ICD10-I70.0). Electronically Signed   By: Ilona Sorrel M.D.   On: 01/26/2017 16:17   Dg Chest 2 View  Result Date: 01/26/2017 CLINICAL DATA:  Productive cough for several weeks EXAM: CHEST  2 VIEW COMPARISON:  11/12/2016 FINDINGS: The heart size and mediastinal contours are within normal limits. Both lungs are clear. The visualized skeletal structures are unremarkable. IMPRESSION: No active cardiopulmonary disease. Electronically Signed   By: Inez Catalina M.D.   On: 01/26/2017 16:27   US Abdomen Limited Ruq  Result Date: 01/26/2017 CLINICAL DATA:  Abdominal pain and vomiting for 2 weeks. EXAM: ULTRASOUND ABDOMEN LIMITED RIGHT UPPER QUADRANT COMPARISON:  CT scan 01/26/2017 FINDINGS: Gallbladder: Echogenic shadowing gallstone noted in the  gallbladder and a small amount of gallbladder sludge. No gallbladder wall thickening, pericholecystic fluid or sonographic Murphy sign to suggest acute cholecystitis. Common bile duct: Diameter: 2.8 mm Liver: Normal echogenicity without focal lesion or biliary dilatation. Portal vein is patent on color Doppler imaging with normal direction of blood flow towards the liver. IMPRESSION: 1. Cholelithiasis without sonographic findings for acute cholecystitis. 2. Normal caliber common bile duct. 3. Normal liver. Electronically Signed   By: Marijo Sanes M.D.   On: 01/26/2017 21:18     IMPRESSION:   *   New diagnosis of significant anemia.  Improved with 2 units of packed red blood cells.  FOBT negative.  Suspect compromised renal function is contributing to the anemia.  *    Nausea, vomiting.   This could be from azotemia but need to rule out gastritis, ulcers, viral gastroenteritis.  Although the lipase is elevated at 90 and she has uncomplicated gallstones on noncontrast CT, pancreas, liver and biliary tree are unremarkable.  No abdominal pain.  Low suspicion that she has pancreatitis.  *    No prior screening colonoscopy in 66 year old.  Should have colonoscopy but with her nausea and vomiting she is not going to be able to prep at the present time.  *    CAD, PAD.  Takes 81 aspirin and Plavix daily.  Last Plavix dose that she kept down was probably on 12/27.  Both aspirin and Plavix are on hold.  *   AKI.  Improvement from CKD stage 5 to stage 4 parameters with 1 L bolus of fluids in the ED.  Only time will tell where her baseline renal function truly lies.    PLAN:     *    Renal ultrasound is pending.      Azucena Freed  01/27/2017, 8:15 AM Pager: [phone]  I have reviewed the entire case in detail with the above APP and discussed the plan in detail.  Therefore, I agree with the diagnoses recorded above. In addition,  I have personally interviewed and examined the patient and have  personally reviewed any abdominal/pelvic CT scan images.  My additional thoughts are as follows: Postprandial upper abdominal pain and bloating  Her symptoms are somewhat difficult to characterize, but it sounds like there is been at least 2 weeks of postprandial bandlike upper abdominal fullness and bloating and sensation of early satiety. She would vomit undigested food sometimes just minutes after eating, almost always within a half hour. She does not describe it as pain per se, and did not have right upper quadrant pain or any pain radiating around or through to the back. She reports being diagnosed with gallstones as an outpatient. She was also referred to a nephrologist and is due to see them sometime next month.  We were primarily consulted for the anemia, which is normocytic with normal iron levels and folic acid and clearly related to her chronic kidney disease, which needs further workup. She is heme-negative, and with all of the above, I do not  think there is GI blood loss causing the anemia.  I have offered her an upper endoscopy tomorrow due to her persistent upper GI symptoms. If unremarkable, consideration may need to be given to outpatient cholecystectomy her symptomatically biliary colic. She has no risk factors for gastroparesis, the symptoms seem of relatively abrupt onset for that diagnosis.  I explained an upper endoscopy in detail and she is agreeable. It will be done by my partner Dr. Hilarie Fredrickson.  The benefits and risks of the planned procedure were described in detail with the patient or (when appropriate) their health care proxy.  Risks were outlined as including, but not limited to, bleeding, infection, perforation, adverse medication reaction leading to cardiac or pulmonary decompensation, or pancreatitis (if ERCP).  The limitation of incomplete mucosal visualization was also discussed.  No guarantees or warranties were given. Her hemoglobin is now improved after transfusion, INR  is normal, platelets normal and I think she is able for upper endoscopy tomorrow.  Further plans to follow upper endoscopy.  Nelida Meuse III Pager [phone]  Mon-Fri 8a-5p [phone] after 5p, weekends, holidays

## 2017-01-27 NOTE — Evaluation (Signed)
Physical Therapy Evaluation Patient Details Name: HARLAN VINAL MRN: [id number] DOB: Apr 07, 1950 Today's Date: 01/27/2017   History of Present Illness  Pt is a 66 y.o. female with medical history significant of CAD s/p Stenting, COPD, GERD, HTN, HLD, Normocytic Anemia, PAD, and other comorbids. She presented to [facility] with a cc of on and off nausea and vomiting.   Clinical Impression  PT eval complete. Pt is independent with all functional mobility. See below for further details. No further skilled PT intervention indicated. PT signing off.    Follow Up Recommendations No PT follow up    Equipment Recommendations  None recommended by PT    Recommendations for Other Services       Precautions / Restrictions Precautions Precautions: None      Mobility  Bed Mobility Overal bed mobility: Independent                Transfers Overall transfer level: Independent Equipment used: None                Ambulation/Gait Ambulation/Gait assistance: Independent Ambulation Distance (Feet): 350 Feet Assistive device: None Gait Pattern/deviations: WFL(Within Functional Limits)   Gait velocity interpretation: >2.62 ft/sec, indicative of independent community ambulator General Gait Details: steady Development worker, international aid    Modified Rankin (Stroke Patients Only)       Balance Overall balance assessment: No apparent balance deficits (not formally assessed)                                           Pertinent Vitals/Pain Pain Assessment: No/denies pain    Home Living Family/patient expects to be discharged to:: Private residence Living Arrangements: Spouse/significant other Available Help at Discharge: Family;Available 24 hours/day Type of Home: Mobile home Home Access: Stairs to enter Entrance Stairs-Rails: None Entrance Stairs-Number of Steps: 3 Home Layout: One level Home Equipment: None      Prior Function  Level of Independence: Independent         Comments: Works part-time job outside the home.      Hand Dominance   Dominant Hand: Right    Extremity/Trunk Assessment   Upper Extremity Assessment Upper Extremity Assessment: Defer to OT evaluation    Lower Extremity Assessment Lower Extremity Assessment: Overall WFL for tasks assessed    Cervical / Trunk Assessment Cervical / Trunk Assessment: Normal  Communication   Communication: No difficulties  Cognition Arousal/Alertness: Awake/alert Behavior During Therapy: WFL for tasks assessed/performed Overall Cognitive Status: Within Functional Limits for tasks assessed                                        General Comments      Exercises     Assessment/Plan    PT Assessment Patent does not need any further PT services  PT Problem List         PT Treatment Interventions      PT Goals (Current goals can be found in the Care Plan section)  Acute Rehab PT Goals Patient Stated Goal: home PT Goal Formulation: All assessment and education complete, DC therapy    Frequency     Barriers to discharge        Co-evaluation  AM-PAC PT "6 Clicks" Daily Activity  Outcome Measure Difficulty turning over in bed (including adjusting bedclothes, sheets and blankets)?: None Difficulty moving from lying on back to sitting on the side of the bed? : None Difficulty sitting down on and standing up from a chair with arms (e.g., wheelchair, bedside commode, etc,.)?: None Help needed moving to and from a bed to chair (including a wheelchair)?: None Help needed walking in hospital room?: None Help needed climbing 3-5 steps with a railing? : None 6 Click Score: 24    End of Session Equipment Utilized During Treatment: Gait belt Activity Tolerance: Patient tolerated treatment well Patient left: in bed;with call bell/phone within reach;with family/visitor present Nurse Communication: Mobility  status PT Visit Diagnosis: Difficulty in walking, not elsewhere classified (R26.2)    Time: 9093-1121 PT Time Calculation (min) (ACUTE ONLY): 14 min   Charges:   PT Evaluation $PT Eval Low Complexity: 1 Low     PT G Codes:        Lorrin Goodell, PT  Office # [phone] Pager [phone]   Lorriane Shire 01/27/2017, 11:34 AM

## 2017-01-27 NOTE — Consult Note (Signed)
[facility] Consult: 8:15 AM 01/27/2017  LOS: 1 day    Referring Provider: Dr Alfredia Ferguson  Primary Care Physician:  Raylene Everts, MD in [place] Primary Gastroenterologist:  unassigned     Reason for Consultation:  FOBT negative anemia.     HPI: Kaitlin [name] is a 66 y.o. female.  On plavix, low dose ASA for hx CAD stent 2007.  PAD, s/p CEA; vertebral artery and iliac artery dz. COPD. Got flu shot in 09/2016. For about 10 days feeling like she had flu: malaise, sweats, body aches, tired, queasy, anorexia, nonpurulent productive cough.  Went to Urgent care in [facility] 12/24 got Rx for BID abx for "sinus infection".  sxs persisted, progressed to non-bloody post prandial N/V.  Went to different UC yesterday, got Phenergan shot and told to go to ED. Came to [facility] ED where labs were drawn (had not been done before)  Hgb 6.7 >> 8.8 post 2 U PRBC.  Was 14.6 in 2014.  FOBT negative.   AKI with GFR 13.  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  Normal renal fx in 03/2014.   Lipase 90 but LFTs normal. Anemia studies show normal iron, iron binding capacity, saturations.  Ferritin, folate normal.  B12 level low normal. CXR normal.  CT abdomen pelvis shows uncomplicated cholelithiasis.  3.2 cm infrarenal AAA.  Patchy lower lobe opacities, ? Infectious or inflammatory broncholitis.    Prior to this recent illness patient says she had little in the way of digestive problems.  With specific foods she would get water brash and reflux.  She used to take acid controlling medication but the symptoms were so rare that she stopped using it.  She might use 1 or 2 Aleve every couple of months.  Stools are brown and occur daily.  Generally has a good appetite, no dysphagia.  No abdominal pain.  No unusual aspects to urination.  No weight  loss or gain. She was referred to a Dr. Dyann Ruddle in [place] last year.  Her recall was that this was in regards to something to do with her kidneys.  He told her she had a kidney stone.  When I research his name that he is an Administrator, Civil Service working out of [facility] but apparently has an office at [facility] in [place].    Family history negative for anemia, ulcer disease, colorectal cancer, kidney disease. Patient smokes 1 pack/day, she last smoked more than a week ago.  She does not drink alcohol.  Her last Plavix dose was probably on 12/28 but she vomited this up.  Past Medical History:  Diagnosis Date  . Arthritis   . CAD (coronary artery disease)    stents  . COPD (chronic obstructive pulmonary disease) ([place])   . Emphysema of lung ([place])   . GERD (gastroesophageal reflux disease)   . Hyperlipidemia   . Hypertension   . Iliac artery stenosis, right ([facility])    60%-70%  . Myocardial infarction ([place])   . Normocytic anemia 01/26/2017  . PAD (peripheral artery disease) ([place]) 12/14/2010  in the left vertebral, bilateral carotids    Past Surgical History:  Procedure Laterality Date  . CARDIAC CATHETERIZATION  02/04/2005   A cypher 5.3 x 18 mm at 16 atmosphere of pressure in the mid LAD, this stent covered the proximal part of the LAD and also the mid LAD, this was then post dilated with 3.25 x 15 mm Quantum at 16 atmosphereof pressure for 45 seconds  . TUBAL LIGATION      Prior to Admission medications   Medication Sig Start Date End Date Taking? Authorizing Provider  aspirin 81 MG tablet Take 81 mg by mouth daily.    [provider]  clopidogrel (PLAVIX) 75 MG tablet TAKE ONE (1) TABLET BY MOUTH EVERY DAY 12/28/16   Arnoldo Lenis, MD  fish oil-omega-3 fatty acids 1000 MG capsule Take 1 g by mouth daily.    [provider]  lisinopril (PRINIVIL,ZESTRIL) 5 MG tablet TAKE ONE-HALF TABLET BY MOUTH DAILY. 12/28/16   Arnoldo Lenis, MD  metoprolol  succinate (TOPROL-XL) 25 MG 24 hr tablet TAKE ONE-HALF TABLET BY MOUTH DAILY. 12/28/16   Arnoldo Lenis, MD  nitroGLYCERIN (NITROSTAT) 0.4 MG SL tablet Place 1 tablet (0.4 mg total) under the tongue every 5 (five) minutes as needed for chest pain. 10/09/13   Arnoldo Lenis, MD  rosuvastatin (CRESTOR) 40 MG tablet TAKE ONE (1) TABLET BY MOUTH EVERY DAY 12/28/16   Arnoldo Lenis, MD    Scheduled Meds: . omega-3 acid ethyl esters  1 g Oral Daily  . rosuvastatin  40 mg Oral Daily   Infusions: . 0.9 % NaCl with KCl 20 mEq / L 100 mL/hr at 01/27/17 0330  . sodium chloride     PRN Meds: acetaminophen **OR** acetaminophen, ondansetron **OR** ondansetron (ZOFRAN) IV, polyethylene glycol, traMADol   Allergies as of 01/26/2017  . (No Known Allergies)    Family History  Problem Relation Age of Onset  . Heart attack Mother   . Hyperlipidemia Mother   . Hypertension Mother   . Diabetes Mother   . Heart attack Father   . Early death Father 54  . Hyperlipidemia Father   . Hypertension Father   . Heart disease Sister   . Hyperlipidemia Sister   . Hypertension Sister   . Diabetes Brother   . Heart disease Sister   . Hyperlipidemia Sister   . Hypertension Sister   . Heart attack Sister   . Heart disease Sister   . Hyperlipidemia Sister   . Hypertension Sister   . Heart attack Brother   . Early death Brother 75  . Hyperlipidemia Brother   . Hypertension Brother   . Heart attack Brother   . Early death Brother 23  . Hyperlipidemia Brother   . Hypertension Brother   . Cancer Maternal Aunt     Social History   Socioeconomic History  . Marital status: Married    Spouse name: Marcello Moores  . Number of children: 3  . Years of education: 27  . Highest education level: Not on file  Social Needs  . Financial resource strain: Not on file  . Food insecurity - worry: Not on file  . Food insecurity - inability: Not on file  . Transportation needs - medical: Not on file  .  Transportation needs - non-medical: Not on file  Occupational History  . Occupation: RETIRED    Comment: warehouse/textiles  Tobacco Use  . Smoking status: Current Every Day Smoker    Packs/day: 1.00  Years: 49.00    Pack years: 49.00    Types: Cigarettes    Start date: 02/09/1967  . Smokeless tobacco: Former Network engineer and Sexual Activity  . Alcohol use: No  . Drug use: No  . Sexual activity: Yes    Birth control/protection: Post-menopausal  Other Topics Concern  . Not on file  Social History Narrative   Retired   Has an [employer] in [employer] with husband Marcello Moores for 23 years   Stays busy with home, gardens, likes to can    REVIEW OF SYSTEMS: Constitutional:  Per HPI ENT:  No nose bleeds Pulm:  Per HPI.  Denies issues with climbing stairs or moderate exertion causing shortness of breath CV:  No palpitations, no LE edema.  No chest pain.  No syncope. GU:  No hematuria, no frequency, no oliguria. GI:  Per HPI Heme: No previous issues with anemia.  No previous iron supplementation or transfusions.  No unusual or excessive bleeding or bruising Transfusions: None until the last 24 hours Neuro:  No headaches.  No seizures.  No tremors.  Has positionally related numbness and tingling in her hands which she can relieve when she moves her arms Derm:  No itching, no rash or sores.  Endocrine:  No sweats or chills.  No polyuria or dysuria Immunization: Flu shot 09/2016. Travel:  None beyond local counties in last few months.    PHYSICAL EXAM: Vital signs in last 24 hours: Vitals:   01/27/17 0040 01/27/17 0323  BP: 97/66 108/67  Pulse: 94 80  Resp: 17 15  Temp: 98 F (36.7 C) (!) 97.4 F (36.3 C)  SpO2: 97% 97%   Wt Readings from Last 3 Encounters:  01/27/17 55.5 kg (122 lb 4.8 oz)  11/26/16 57.2 kg (126 lb 1.9 oz)  10/25/16 58.1 kg (128 lb)    General: Pleasant.  Looks tired but does not appear ill.  Comfortable. Head: No facial asymmetry or swelling.  No signs of  head trauma. Eyes: Slight conjunctival pallor.  No icterus.  EOMI. Ears: Not hard of hearing Nose: No sneezing, no drainage.  Does not sound congested. Mouth: Moist, pink, clear oral mucosa.  Tongue midline.  Edentulous.  Upper dentures in place. Neck: No mass.  No JVD.  No thyromegaly. Lungs: Diminished breath sounds but clear overall.  Loose cough with foamy white sputum.  No dyspnea. Heart: RRR.  No MRG.  S1, S2 present. Abdomen: Nontender, soft, nondistended.  No organomegaly, bruits, hernias.  Bowel sounds active..   Rectal: Did not repeat rectal exam.  Stool yesterday was FOBT negative. Musc/Skeltl: No joint redness, swelling or gross deformity. Extremities: No CCE.  2+ pedal pulses bilaterally.  Feet warm. Neurologic: Alert.  Oriented x3.  Moves all 4 limbs with grossly normal strength.  No tremor. Skin: No rashes, sores, telangiectasia. Tattoos: None Nodes: No cervical adenopathy. Psych: Cooperative, pleasant, calm.  Intake/Output from previous day: 12/29 0701 - 12/30 0700 In: 1965 [P.O.:480; I.V.:330; Blood:631; IV Piggyback:525] Out: 750 [Urine:750] Intake/Output this shift: No intake/output data recorded.  LAB RESULTS: Recent Labs    01/26/17 1400 01/27/17 0401  WBC 8.8 5.8  HGB 6.7* 8.8*  HCT 20.5* 26.3*  PLT 353 250   BMET Lab Results  Component Value Date   NA 144 01/27/2017   NA 139 01/26/2017   NA 140 04/09/2014   K 5.0 01/27/2017   K 4.5 01/26/2017   K 4.9 04/09/2014   CL 112 (H) 01/27/2017  CL 103 01/26/2017   CL 106 04/09/2014   CO2 24 01/27/2017   CO2 25 01/26/2017   CO2 24 04/09/2014   GLUCOSE 77 01/27/2017   GLUCOSE 120 (H) 01/26/2017   GLUCOSE 76 04/09/2014   BUN 62 (H) 01/27/2017   BUN 66 (H) 01/26/2017   BUN 17 04/09/2014   CREATININE 2.91 (H) 01/27/2017   CREATININE 3.53 (H) 01/26/2017   CREATININE 1.03 04/09/2014   CALCIUM 9.0 01/27/2017   CALCIUM 9.3 01/26/2017   CALCIUM 9.1 04/09/2014   LFT Recent Labs    01/26/17 1400  01/27/17 0401  PROT 7.8 6.7  ALBUMIN 3.2* 2.8*  AST 28 23  ALT 17 15  ALKPHOS 88 78  BILITOT 0.8 0.8   PT/INR Lab Results  Component Value Date   INR 1.16 01/27/2017   INR 0.9 04/23/2007   Hepatitis Panel No results for input(s): HEPBSAG, HCVAB, HEPAIGM, HEPBIGM in the last 72 hours. C-Diff No components found for: CDIFF Lipase     Component Value Date/Time   LIPASE 90 (H) 01/26/2017 1400    Drugs of Abuse  No results found for: LABOPIA, COCAINSCRNUR, LABBENZ, AMPHETMU, THCU, LABBARB   RADIOLOGY STUDIES: Ct Abdomen Pelvis Wo Contrast  Result Date: 01/26/2017 CLINICAL DATA:  Abdominal pain, most severe on the right. Fever. Headache. Fatigue. Vomiting. Anorexia. EXAM: CT ABDOMEN AND PELVIS WITHOUT CONTRAST TECHNIQUE: Multidetector CT imaging of the abdomen and pelvis was performed following the standard protocol without IV contrast. COMPARISON:  09/26/2015 CT abdomen/ pelvis. FINDINGS: Lower chest: Mild patchy tree-in-bud opacities in the anterior lower lobes bilaterally, largely new. Coronary atherosclerosis. Hepatobiliary: Normal liver size. Granulomatous posterior right liver lobe calcification. No liver masses. Cholelithiasis. No gallbladder wall thickening or pericholecystic fluid. No biliary ductal dilatation. Pancreas: Normal, with no mass or duct dilation. Spleen: Normal size. No mass. Adrenals/Urinary Tract: Normal right adrenal. Mild thickening of the left adrenal gland without discrete left adrenal nodules, unchanged. No renal stones. No hydronephrosis. Simple 1.2 cm interpolar left renal cyst. Subcentimeter hypodense interpolar right renal cortical lesion is too small to characterize and is stable, considered benign. No new contour deforming renal masses. Normal bladder. Stomach/Bowel: Normal non-distended stomach. Normal caliber small bowel with no small bowel wall thickening. Normal appendix. Mild sigmoid diverticulosis, with no large bowel wall thickening or pericolonic  fat stranding. Vascular/Lymphatic: Atherosclerotic abdominal aorta with stable 3.2 cm infrarenal abdominal aortic aneurysm. No pathologically enlarged lymph nodes in the abdomen or pelvis. Reproductive: Grossly normal uterus.  No adnexal mass. Other: No pneumoperitoneum, ascites or focal fluid collection. Musculoskeletal: No aggressive appearing focal osseous lesions. Stable advanced degenerative changes at the sacroiliac joints. Stable advanced degenerative disc disease in the mid to lower lumbar spine. IMPRESSION: 1. No acute abnormality in the abdomen or pelvis. No evidence of bowel obstruction or acute bowel inflammation. Mild sigmoid diverticulosis, with no evidence of acute diverticulitis. 2. Cholelithiasis, with no evidence of acute cholecystitis. 3. Mild patchy tree-in-bud opacities in the lower lobes, indicative of a nonspecific mild infectious or inflammatory bronchiolitis. 4. Stable 3.2 cm infrarenal abdominal aortic aneurysm. Recommend followup by ultrasound in 3 years. This recommendation follows ACR consensus guidelines: White Paper of the ACR Incidental Findings Committee II on Vascular Findings. J Am Coll Radiol 2013; 27:253-664. 5.  Aortic Atherosclerosis (ICD10-I70.0). Electronically Signed   By: Ilona Sorrel M.D.   On: 01/26/2017 16:17   Dg Chest 2 View  Result Date: 01/26/2017 CLINICAL DATA:  Productive cough for several weeks EXAM: CHEST  2 VIEW COMPARISON:  11/12/2016 FINDINGS: The heart size and mediastinal contours are within normal limits. Both lungs are clear. The visualized skeletal structures are unremarkable. IMPRESSION: No active cardiopulmonary disease. Electronically Signed   By: Inez Catalina M.D.   On: 01/26/2017 16:27   US Abdomen Limited Ruq  Result Date: 01/26/2017 CLINICAL DATA:  Abdominal pain and vomiting for 2 weeks. EXAM: ULTRASOUND ABDOMEN LIMITED RIGHT UPPER QUADRANT COMPARISON:  CT scan 01/26/2017 FINDINGS: Gallbladder: Echogenic shadowing gallstone noted in the  gallbladder and a small amount of gallbladder sludge. No gallbladder wall thickening, pericholecystic fluid or sonographic Murphy sign to suggest acute cholecystitis. Common bile duct: Diameter: 2.8 mm Liver: Normal echogenicity without focal lesion or biliary dilatation. Portal vein is patent on color Doppler imaging with normal direction of blood flow towards the liver. IMPRESSION: 1. Cholelithiasis without sonographic findings for acute cholecystitis. 2. Normal caliber common bile duct. 3. Normal liver. Electronically Signed   By: Marijo Sanes M.D.   On: 01/26/2017 21:18     IMPRESSION:   *   New diagnosis of significant anemia.  Improved with 2 units of packed red blood cells.  FOBT negative.  Suspect compromised renal function is contributing to the anemia.  *    Nausea, vomiting.   This could be from azotemia but need to rule out gastritis, ulcers, viral gastroenteritis.  Although the lipase is elevated at 90 and she has uncomplicated gallstones on noncontrast CT, pancreas, liver and biliary tree are unremarkable.  No abdominal pain.  Low suspicion that she has pancreatitis.  *    No prior screening colonoscopy in 66 year old.  Should have colonoscopy but with her nausea and vomiting she is not going to be able to prep at the present time.  *    CAD, PAD.  Takes 81 aspirin and Plavix daily.  Last Plavix dose that she kept down was probably on 12/27.  Both aspirin and Plavix are on hold.  *   AKI.  Improvement from CKD stage 5 to stage 4 parameters with 1 L bolus of fluids in the ED.  Only time will tell where her baseline renal function truly lies.    PLAN:     *    Renal ultrasound is pending.      Azucena Freed  01/27/2017, 8:15 AM Pager: [phone]  I have reviewed the entire case in detail with the above APP and discussed the plan in detail.  Therefore, I agree with the diagnoses recorded above. In addition,  I have personally interviewed and examined the patient and have  personally reviewed any abdominal/pelvic CT scan images.  My additional thoughts are as follows: Postprandial upper abdominal pain and bloating  Her symptoms are somewhat difficult to characterize, but it sounds like there is been at least 2 weeks of postprandial bandlike upper abdominal fullness and bloating and sensation of early satiety. She would vomit undigested food sometimes just minutes after eating, almost always within a half hour. She does not describe it as pain per se, and did not have right upper quadrant pain or any pain radiating around or through to the back. She reports being diagnosed with gallstones as an outpatient. She was also referred to a nephrologist and is due to see them sometime next month.  We were primarily consulted for the anemia, which is normocytic with normal iron levels and folic acid and clearly related to her chronic kidney disease, which needs further workup. She is heme-negative, and with all of the above, I do not  think there is GI blood loss causing the anemia.  I have offered her an upper endoscopy tomorrow due to her persistent upper GI symptoms. If unremarkable, consideration may need to be given to outpatient cholecystectomy her symptomatically biliary colic. She has no risk factors for gastroparesis, the symptoms seem of relatively abrupt onset for that diagnosis.  I explained an upper endoscopy in detail and she is agreeable. It will be done by my partner Dr. Hilarie Fredrickson.  The benefits and risks of the planned procedure were described in detail with the patient or (when appropriate) their health care proxy.  Risks were outlined as including, but not limited to, bleeding, infection, perforation, adverse medication reaction leading to cardiac or pulmonary decompensation, or pancreatitis (if ERCP).  The limitation of incomplete mucosal visualization was also discussed.  No guarantees or warranties were given. Her hemoglobin is now improved after transfusion, INR  is normal, platelets normal and I think she is able for upper endoscopy tomorrow.  Further plans to follow upper endoscopy.  Nelida Meuse III Pager [phone]  Mon-Fri 8a-5p [phone] after 5p, weekends, holidays

## 2017-01-27 NOTE — Progress Notes (Signed)
Patient has been tolerating blood transfusion well, no symptoms of concern.  At this time patient is sleeping, will keep monitoring the patient.

## 2017-01-28 ENCOUNTER — Inpatient Hospital Stay (HOSPITAL_COMMUNITY): Payer: Medicare Other

## 2017-01-28 ENCOUNTER — Encounter (HOSPITAL_COMMUNITY)
Admission: EM | Disposition: A | Discharge status: Home / Self Care | Payer: Self-pay | Source: Home / Self Care | Attending: Internal Medicine

## 2017-01-28 ENCOUNTER — Encounter (HOSPITAL_COMMUNITY): Payer: Self-pay | Admitting: *Deleted

## 2017-01-28 ENCOUNTER — Inpatient Hospital Stay (HOSPITAL_COMMUNITY): Payer: Medicare Other | Admitting: Anesthesiology

## 2017-01-28 DIAGNOSIS — K228 Other specified diseases of esophagus: Secondary | ICD-10-CM

## 2017-01-28 DIAGNOSIS — R6881 Early satiety: Secondary | ICD-10-CM

## 2017-01-28 DIAGNOSIS — K2970 Gastritis, unspecified, without bleeding: Secondary | ICD-10-CM

## 2017-01-28 HISTORY — PX: ESOPHAGOGASTRODUODENOSCOPY: SHX5428

## 2017-01-28 LAB — COMPREHENSIVE METABOLIC PANEL
ALBUMIN: 2.8 g/dL — AB (ref 3.5–5.0)
ALT: 16 U/L (ref 14–54)
ANION GAP: 7 (ref 5–15)
AST: 27 U/L (ref 15–41)
Alkaline Phosphatase: 80 U/L (ref 38–126)
BUN: 40 mg/dL — AB (ref 6–20)
CHLORIDE: 114 mmol/L — AB (ref 101–111)
CO2: 22 mmol/L (ref 22–32)
Calcium: 8.8 mg/dL — ABNORMAL LOW (ref 8.9–10.3)
Creatinine, Ser: 1.88 mg/dL — ABNORMAL HIGH (ref 0.44–1.00)
GFR calc Af Amer: 31 mL/min — ABNORMAL LOW (ref >60)
GFR calc non Af Amer: 27 mL/min — ABNORMAL LOW (ref >60)
GLUCOSE: 77 mg/dL (ref 65–99)
POTASSIUM: 4.9 mmol/L (ref 3.5–5.1)
SODIUM: 143 mmol/L (ref 135–145)
Total Bilirubin: 0.6 mg/dL (ref 0.3–1.2)
Total Protein: 6.5 g/dL (ref 6.5–8.1)

## 2017-01-28 LAB — TYPE AND SCREEN
ABO/RH(D): O POS
Antibody Screen: NEGATIVE
UNIT DIVISION: 0
Unit division: 0

## 2017-01-28 LAB — GLUCOSE, CAPILLARY
GLUCOSE-CAPILLARY: 74 mg/dL (ref 65–99)
Glucose-Capillary: 84 mg/dL (ref 65–99)

## 2017-01-28 LAB — CBC WITH DIFFERENTIAL/PLATELET
BASOS ABS: 0 10*3/uL (ref 0.0–0.1)
BASOS PCT: 0 %
EOS ABS: 0.1 10*3/uL (ref 0.0–0.7)
EOS PCT: 1 %
HCT: 26.7 % — ABNORMAL LOW (ref 36.0–46.0)
Hemoglobin: 8.9 g/dL — ABNORMAL LOW (ref 12.0–15.0)
Lymphocytes Relative: 28 %
Lymphs Abs: 1.9 10*3/uL (ref 0.7–4.0)
MCH: 31.6 pg (ref 26.0–34.0)
MCHC: 33.3 g/dL (ref 30.0–36.0)
MCV: 94.7 fL (ref 78.0–100.0)
MONO ABS: 0.3 10*3/uL (ref 0.1–1.0)
Monocytes Relative: 4 %
NEUTROS ABS: 4.4 10*3/uL (ref 1.7–7.7)
Neutrophils Relative %: 67 %
PLATELETS: 270 10*3/uL (ref 150–400)
RBC: 2.82 MIL/uL — ABNORMAL LOW (ref 3.87–5.11)
RDW: 16 % — AB (ref 11.5–15.5)
WBC: 6.6 10*3/uL (ref 4.0–10.5)

## 2017-01-28 LAB — MAGNESIUM: Magnesium: 1.8 mg/dL (ref 1.7–2.4)

## 2017-01-28 LAB — BPAM RBC
BLOOD PRODUCT EXPIRATION DATE: 201901252359
Blood Product Expiration Date: 201901252359
ISSUE DATE / TIME: 201812300001
ISSUE DATE / TIME: 201812292133
UNIT TYPE AND RH: 5100
Unit Type and Rh: 5100

## 2017-01-28 LAB — T4, FREE: FREE T4: 1.04 ng/dL (ref 0.61–1.12)

## 2017-01-28 LAB — PHOSPHORUS: Phosphorus: 4 mg/dL (ref 2.5–4.6)

## 2017-01-28 SURGERY — EGD (ESOPHAGOGASTRODUODENOSCOPY)
Anesthesia: Monitor Anesthesia Care

## 2017-01-28 MED ORDER — ONDANSETRON HCL 4 MG/2ML IJ SOLN
INTRAMUSCULAR | Status: DC | PRN
  Administered 2017-01-28: 4 mg via INTRAVENOUS

## 2017-01-28 MED ORDER — PROPOFOL 10 MG/ML IV BOLUS
INTRAVENOUS | Status: DC | PRN
  Administered 2017-01-28: 30 mg via INTRAVENOUS
  Administered 2017-01-28: 40 mg via INTRAVENOUS
  Administered 2017-01-28: 30 mg via INTRAVENOUS
  Administered 2017-01-28: 20 mg via INTRAVENOUS
  Administered 2017-01-28: 30 mg via INTRAVENOUS

## 2017-01-28 MED ORDER — PANTOPRAZOLE SODIUM 40 MG PO TBEC
40.0000 mg | DELAYED_RELEASE_TABLET | Freq: Two times a day (BID) | ORAL | Status: DC
Start: 2017-01-28 — End: 2017-01-28
  Administered 2017-01-28: 40 mg via ORAL
  Filled 2017-01-28: qty 1

## 2017-01-28 MED ORDER — PROPOFOL 500 MG/50ML IV EMUL
INTRAVENOUS | Status: DC | PRN
  Administered 2017-01-28: 100 ug/kg/min via INTRAVENOUS

## 2017-01-28 MED ORDER — LACTATED RINGERS IV SOLN
INTRAVENOUS | Status: DC | PRN
  Administered 2017-01-28: 09:00:00 via INTRAVENOUS

## 2017-01-28 MED ORDER — SODIUM CHLORIDE 0.9 % IV SOLN: INTRAVENOUS | Status: DC

## 2017-01-28 MED ORDER — PANTOPRAZOLE SODIUM 40 MG PO TBEC: 40.0000 mg | DELAYED_RELEASE_TABLET | Freq: Two times a day (BID) | ORAL | 0 refills | Status: DC

## 2017-01-28 MED ORDER — PHENYLEPHRINE HCL 10 MG/ML IJ SOLN
INTRAMUSCULAR | Status: DC | PRN
  Administered 2017-01-28 (×2): 80 ug via INTRAVENOUS

## 2017-01-28 MED ORDER — LIDOCAINE 2% (20 MG/ML) 5 ML SYRINGE
INTRAMUSCULAR | Status: DC | PRN
  Administered 2017-01-28: 100 mg via INTRAVENOUS

## 2017-01-28 NOTE — Plan of Care (Signed)
  Clinical Measurements: Ability to maintain clinical measurements within normal limits will improve. VS stable. No complaints of N,V,D or abd pain this shift. Will continue to monitor and assess pt this shift. Pt is NPO at this time in preparation for EGD today.  01/28/2017 0516 - Progressing by Mellissa Kohut, RN

## 2017-01-28 NOTE — Progress Notes (Signed)
Patient ate mashed potatoes and roll.  Patient stated stomach hurt a "little bit" but stated she did not vomit after eating.  Dr Tawanna Solo notified.    He stated to have patient to contact MD in regards to when metoprolol should be resumed considering BP's are in the 90's.  Last BP=97/62.  I added this instruction to AVS.

## 2017-01-28 NOTE — Interval H&P Note (Signed)
History and Physical Interval Note: For EGD today to eval N/V, early satiety and upper abd bloating symptom. She has a normocytic anemia felt secondary to her CKD The nature of the procedure, as well as the risks, benefits, and alternatives were carefully and thoroughly reviewed with the patient. Ample time for discussion and questions allowed. The patient understood, was satisfied, and agreed to proceed.     01/28/2017 9:17 AM  Kaitlin [name]  has presented today for surgery, with the diagnosis of vomiting  The various methods of treatment have been discussed with the patient and family. After consideration of risks, benefits and other options for treatment, the patient has consented to  Procedure(s): ESOPHAGOGASTRODUODENOSCOPY (EGD) (N/A) as a surgical intervention .  The patient's history has been reviewed, patient examined, no change in status, stable for surgery.  I have reviewed the patient's chart and labs.  Questions were answered to the patient's satisfaction.     [name]

## 2017-01-28 NOTE — Progress Notes (Signed)
Spoke to Dr Tawanna Solo, he stated to resume aspirin and plavix tomorrow.  He ordered patient to receive soft diet prior to discharge and to see how she does later today for discharge.  Will recheck BP and document.

## 2017-01-28 NOTE — Op Note (Signed)
[facility] Patient Name: Kaitlin [name] Procedure Date : 01/28/2017 MRN: [id number] Attending MD: Jerene Bears , MD Date of Birth: Jul 30, 1950 CSN: [id number] Age: 66 Admit Type: Inpatient Procedure:                Upper GI endoscopy Indications:              Abdominal bloating, Early satiety, Nausea with                            vomiting Providers:                Lajuan Lines. Hilarie Fredrickson, MD, Carolynn Comment RN, RN, Elspeth Cho Tech., Technician, Elmer Sow, CRNA Referring MD:             [facility] Medicines:                Monitored Anesthesia Care Complications:            No immediate complications. Estimated Blood Loss:     Estimated blood loss was minimal. Procedure:                Pre-Anesthesia Assessment:                           - Prior to the procedure, a History and Physical                            was performed, and patient medications and                            allergies were reviewed. The patient's tolerance of                            previous anesthesia was also reviewed. The risks                            and benefits of the procedure and the sedation                            options and risks were discussed with the patient.                            All questions were answered, and informed consent                            was obtained. Prior Anticoagulants: The patient has                            taken Plavix (clopidogrel), last dose was 3 days                            prior to procedure. ASA Grade Assessment: III - A  patient with severe systemic disease. After                            reviewing the risks and benefits, the patient was                            deemed in satisfactory condition to undergo the                            procedure.                           After obtaining informed consent, the endoscope was                            passed under  direct vision. Throughout the                            procedure, the patient's blood pressure, pulse, and                            oxygen saturations were monitored continuously. The                            [id number]-[id number] ([id number]) scope was introduced through the                            mouth, and advanced to the second part of duodenum.                            The upper GI endoscopy was accomplished without                            difficulty. The patient tolerated the procedure                            well. Scope In: Scope Out: Findings:      Localized mild mucosal changes characterized by nodularity were found at       the gastroesophageal junction. Biopsies were taken with a cold forceps       for histology.      The exam of the esophagus was otherwise normal.      Patchy moderate inflammation characterized by erosions and erythema was       found in the gastric antrum. Biopsies were taken with a cold forceps for       histology and Helicobacter pylori testing.      The cardia and gastric fundus were normal on retroflexion.      The examined duodenum was normal. Impression:               - Nodular mucosa at the GE junction. Biopsied.                           - Gastritis. Biopsied.                           -  Normal examined duodenum. Moderate Sedation:      N/A Recommendation:           - Return patient to hospital ward for ongoing care.                           - Full liquid diet.                           - Advance diet as tolerated.                           - Avoid ibuprofen, naproxen, or other non-steroidal                            anti-inflammatory drugs.                           - Will begin pantoprazole 40 mg BID-AC for                            gastritis seen today. Close attention to renal                            function and if worsening with PPI, then switch to                            high dose BID H2 blocker. Followup pathology and if                             negative for H. Pylori and no improvement with acid                            suppression therapy, then surgical consultation for                            cholecystectomy is recommended given gallstones and                            recent symptoms. Procedure Code(s):        --- Professional ---                           [phone], Esophagogastroduodenoscopy, flexible,                            transoral; with biopsy, single or multiple Diagnosis Code(s):        --- Professional ---                           K22.8, Other specified diseases of esophagus                           K29.70, Gastritis, unspecified, without bleeding  R14.0, Abdominal distension (gaseous)                           R68.81, Early satiety                           R11.2, Nausea with vomiting, unspecified CPT copyright 2016 [employer]. All rights reserved. The codes documented in this report are preliminary and upon coder review may  be revised to meet current compliance requirements. Jerene Bears, MD 01/28/2017 10:38:15 AM This report has been signed electronically. Number of Addenda: 0

## 2017-01-28 NOTE — Discharge Instructions (Signed)
Soft-Food Meal Plan A soft-food meal plan includes foods that are safe and easy to swallow. This meal plan typically is used:  If you are having trouble chewing or swallowing foods.  As a transition meal plan after only having had liquid meals for a long period.  What do I need to know about the soft-food meal plan? A soft-food meal plan includes tender foods that are soft and easy to chew and swallow. In most cases, bite-sized pieces of food are easier to swallow. A bite-sized piece is about  inch or smaller. Foods in this plan do not need to be ground or pureed. Foods that are very hard, crunchy, or sticky should be avoided. Also, breads, cereals, yogurts, and desserts with nuts, seeds, or fruits should be avoided. What foods can I eat? Grains Rice and wild rice. Moist bread, dressing, pasta, and noodles. Well-moistened dry or cooked cereals, such as farina (cooked wheat cereal), oatmeal, or grits. Biscuits, breads, muffins, pancakes, and waffles that have been well moistened. Vegetables Shredded lettuce. Cooked, tender vegetables, including potatoes without skins. Vegetable juices. Broths or creamed soups made with vegetables that are not stringy or chewy. Strained tomatoes (without seeds). Fruits Canned or well-cooked fruits. Soft (ripe), peeled fresh fruits, such as peaches, nectarines, kiwi, cantaloupe, honeydew melon, and watermelon (without seeds). Soft berries with small seeds, such as strawberries. Fruit juices (without pulp). Meats and Other Protein Sources Moist, tender, lean beef. Mutton. Lamb. Veal. Chicken. [place]. Liver. Ham. Fish without bones. Eggs. Dairy Milk, milk drinks, and cream. Plain cream cheese and cottage cheese. Plain yogurt. Sweets/Desserts Flavored gelatin desserts. Custard. Plain ice cream, frozen yogurt, sherbet, milk shakes, and malts. Plain cakes and cookies. Plain hard candy. Other Butter, margarine (without trans fat), and cooking oils. Mayonnaise. Cream  sauces. Mild spices, salt, and sugar. Syrup, molasses, honey, and jelly. The items listed above may not be a complete list of recommended foods or beverages. Contact your dietitian for more options. What foods are not recommended? Grains Dry bread, toast, crackers that have not been moistened. Coarse or dry cereals, such as bran, granola, and shredded wheat. Tough or chewy crusty breads, such as [place] bread or baguettes. Vegetables Corn. Raw vegetables except shredded lettuce. Cooked vegetables that are tough or stringy. Tough, crisp, fried potatoes and potato skins. Fruits Fresh fruits with skins or seeds or both, such as apples, pears, or grapes. Stringy, high-pulp fruits, such as papaya, pineapple, coconut, or mango. Fruit leather, fruit roll-ups, and all dried fruits. Meats and Other Protein Sources Sausages and hot dogs. Meats with gristle. Fish with bones. Nuts, seeds, and chunky peanut or other nut butters. Sweets/Desserts Cakes or cookies that are very dry or chewy. The items listed above may not be a complete list of foods and beverages to avoid. Contact your dietitian for more information. This information is not intended to replace advice given to you by your health care provider. Make sure you discuss any questions you have with your health care provider. Document Released: 04/24/2007 Document Revised: 06/23/2015 Document Reviewed: 12/12/2012 Elsevier Interactive Patient Education  2017 [employer] Gastritis, Adult Gastritis is swelling (inflammation) of the stomach. When you have this condition, you can have these problems (symptoms):  Pain in your stomach.  A burning feeling in your stomach.  Feeling sick to your stomach (nauseous).  Throwing up (vomiting).  Feeling too full after you eat.  It is important to get help for this condition. Without help, your stomach can bleed, and you can get  sores (ulcers) in your stomach. Follow these instructions at home:  Take  over-the-counter and prescription medicines only as told by your doctor.  If you were prescribed an antibiotic medicine, take it as told by your doctor. Do not stop taking it even if you start to feel better.  Drink enough fluid to keep your pee (urine) clear or pale yellow.  Instead of eating big meals, eat small meals often. Contact a health care provider if:  Your problems get worse.  Your problems go away and then come back. Get help right away if:  You throw up blood or something that looks like coffee grounds.  You have black or dark red poop (stools).  You cannot keep fluids down.  Your stomach pain gets worse.  You have a fever.  You do not feel better after 1 week. This information is not intended to replace advice given to you by your health care provider. Make sure you discuss any questions you have with your health care provider. Document Released: 07/04/2007 Document Revised: 09/14/2015 Document Reviewed: 10/09/2014 Elsevier Interactive Patient Education  [employer].

## 2017-01-28 NOTE — Discharge Summary (Signed)
Physician Discharge Summary  Kaitlin [name] YFV:[id number] DOB: August 15, 1950 DOA: 01/26/2017  PCP: Raylene Everts, MD  Admit date: 01/26/2017 Discharge date: 01/28/2017  Admitted From: Home Disposition: Home  Recommendations for Outpatient Follow-up:  1. Follow up with PCP in 1-2 weeks 2. Please obtain BMP/CBC in one week 3. Please follow up with GI,nephrology  Home Health:No  Discharge Condition: Stable CODE STATUS:Full Diet recommendation: Heart Healthy  Brief/Interim Summary: Admission H and P: Patient is a Kaitlin A Collinsis a 66 y.o.femalewith medical history significant ofCAD s/p Stenting, COPD, GERD, HTN, HLD, Normocytic Anemia, PAD, and other comorbids who presented to [facility] with a cc of on and off nausea and vomiting with an inability to keep any food down. She had some Right upper quadrant pain as well and has been feeling lightheaded and dizzy when walking. No SOB. Denies any blood in urine and stool and states her vomit contains no blood. Has not been taking NSAIDs. States she vomits sometimes minutes after she eats and feels full after eating. Was seen at Urgent Care and referred to ED for evaluation. [facility] was consulted for Intractable N/V and Symptomatic Anemia.  Hospital course:  Patient was evaluated by GI.  She underwent upper GI endoscopy.  Patchy moderate inflammation characterized by erosions and erythema was found in the gastric antrum.  Patient has been started on PPI twice daily.  She tolerated clear liquid diet and some Jell-O after the procedure. Patient will be discharged today to home.  She will be continued on PPI twice daily.  She has been instructed to avoid NSAIDs.She will resume aspirin and plavix from tomorrow.   Following problems were addressed during her hospitalization:   Intractable Nausea and Vomiting suspected from Viral Gastroenteritis r/o other causes as patient is having Early Satiety and Vomiting postprandially   Resolved Underwent EGD Started on PPI  Generalized Weakness and Fatigue, improving  -Much improved -PT/OT Eval recommending no PT/OT Follow up   Acute Kidney Injury likely in the setting of Dehydration from Intractable N/V; ?CKD slightly improving  -Improved -Improved with IV fluids -Check BMP in a week. -She is will follow up with nephrology as an outpatient.  Recommended to drink plenty of fluids.  Lisinopril stopped.  RUQ Pain -resolved  Symptomatic Acute on Chronic Normocytic Anemia  -Last Hb/Hc was 14.6/43.0 in 2014 -Now presents with Hb/Hct of 6.7/20.5 on admission  -Checked Anemia Panel but do not know if blood was given before or after it was checked; Iron Level was 92, UBIC was 238, TIBC was 330, Saturation Ratios was 28, Ferritin was 256, Folate was 198, B12 was 198; -Reticulocyte Count Normal and Coagulation Studies Normal -ED Typed and Screened and are transfused 2 units pRBC's -Check Haptoglobin pending, LDH was slightly elevated at 245 -FOBT Negative -Hb/Hct improved to 8.9/26.7 after 2 units of pRBC's -Check CBC in a week.  COPD/Chronic Bronchitis -Not on Home Oxygen and not in exacerbation -CT Scan showedMild patchy tree-in-bud opacities in the lower lobes, indicative of a nonspecific mild infectious or inflammatory bronchiolitis. -Afebrile and no Leukocytosis  Tobacco Abuse/Smoker -Patient Smokes 1PPD -Smoking Cessation Counseling given   HTN but BP on the Softer Side -Hold Home Antihypertensives for the time being -Patient takes Lisinopril 5 mg po daily, Metoprolol 25 mg po Daily -Lisinopril held  Hyperlipidemia -C/w Home Rosuvastain 40 mg po Daily and Omega-3 Fatty Acics  CAD s/p Stenting -Start ASA and Plavix from tomorrow -C/w Rosuvastatin and Omega 3 Fish Oil  AAA  -CT  Abd Pelvis showed stable 3.2 cm infrarenal abdominal aortic aneurysm. -Last evauation showed AAA was 3.1 -Recommend followup by ultrasound in 3  years.  Decreased TSH -TSH was 0.282 -T4 normal     Discharge Diagnoses:  Active Problems:   HTN (hypertension)   Hyperlipidemia   Tobacco abuse   Nicotine dependence   AAA (abdominal aortic aneurysm) (HCC)   Emphysema lung (HCC)   Intractable nausea and vomiting   Generalized weakness   Fatigue   RUQ pain   Normocytic anemia   AKI (acute kidney injury) ([place])   Early satiety   Gastritis without bleeding    Discharge Instructions  Discharge Instructions    Diet - low sodium heart healthy   Complete by:  As directed    Discharge instructions   Complete by:  As directed    1) Follow up with you PCP in a week. 2) Follow up with Nephrology as an outpatient in 1-2 weeks. 3)Take prescribed medications as an outpatient. 4) Do CBC to check you hemoglobin and BMP test to check your kidney fucntion in a week.Stop taking lisinopril for now until you see the nephrologist. 5) Take plenty of fluids. 6) Avoid NSAIDs. 6) Follow up with gastroeneterology in 4 weeks.   Increase activity slowly   Complete by:  As directed      Allergies as of 01/28/2017   No Known Allergies     Medication List    STOP taking these medications   lisinopril 5 MG tablet Commonly known as:  PRINIVIL,ZESTRIL     TAKE these medications   aspirin 81 MG tablet Take 81 mg by mouth daily.   clopidogrel 75 MG tablet Commonly known as:  PLAVIX TAKE ONE (1) TABLET BY MOUTH EVERY DAY What changed:  See the new instructions.   fish oil-omega-3 fatty acids 1000 MG capsule Take 1 g by mouth daily as needed (supplemental).   metoprolol succinate 25 MG 24 hr tablet Commonly known as:  TOPROL-XL TAKE ONE-HALF TABLET BY MOUTH DAILY. What changed:    how much to take  how to take this  when to take this   nitroGLYCERIN 0.4 MG SL tablet Commonly known as:  NITROSTAT Place 1 tablet (0.4 mg total) under the tongue every 5 (five) minutes as needed for chest pain.   pantoprazole 40 MG  tablet Commonly known as:  PROTONIX Take 1 tablet (40 mg total) by mouth 2 (two) times daily before a meal.   rosuvastatin 40 MG tablet Commonly known as:  CRESTOR TAKE ONE (1) TABLET BY MOUTH EVERY DAY What changed:  See the new instructions.      Follow-up Information    Raylene Everts, MD. Schedule an appointment as soon as possible for a visit in 1 week(s).   Specialty:  Family Medicine Contact information: [address] [place] [address] [phone]        Jerene Bears, MD. Schedule an appointment as soon as possible for a visit in 4 week(s).   Specialty:  Gastroenterology Contact information: [address] [address] [phone]          No Known Allergies  Consultations:  GI   Procedures/Studies: Ct Abdomen Pelvis Wo Contrast  Result Date: 01/26/2017 CLINICAL DATA:  Abdominal pain, most severe on the right. Fever. Headache. Fatigue. Vomiting. Anorexia. EXAM: CT ABDOMEN AND PELVIS WITHOUT CONTRAST TECHNIQUE: Multidetector CT imaging of the abdomen and pelvis was performed following the standard protocol without IV contrast.  COMPARISON:  09/26/2015 CT abdomen/ pelvis. FINDINGS: Lower chest: Mild patchy tree-in-bud opacities in the anterior lower lobes bilaterally, largely new. Coronary atherosclerosis. Hepatobiliary: Normal liver size. Granulomatous posterior right liver lobe calcification. No liver masses. Cholelithiasis. No gallbladder wall thickening or pericholecystic fluid. No biliary ductal dilatation. Pancreas: Normal, with no mass or duct dilation. Spleen: Normal size. No mass. Adrenals/Urinary Tract: Normal right adrenal. Mild thickening of the left adrenal gland without discrete left adrenal nodules, unchanged. No renal stones. No hydronephrosis. Simple 1.2 cm interpolar left renal cyst. Subcentimeter hypodense interpolar right renal cortical lesion is too small to characterize and is stable, considered benign. No new  contour deforming renal masses. Normal bladder. Stomach/Bowel: Normal non-distended stomach. Normal caliber small bowel with no small bowel wall thickening. Normal appendix. Mild sigmoid diverticulosis, with no large bowel wall thickening or pericolonic fat stranding. Vascular/Lymphatic: Atherosclerotic abdominal aorta with stable 3.2 cm infrarenal abdominal aortic aneurysm. No pathologically enlarged lymph nodes in the abdomen or pelvis. Reproductive: Grossly normal uterus.  No adnexal mass. Other: No pneumoperitoneum, ascites or focal fluid collection. Musculoskeletal: No aggressive appearing focal osseous lesions. Stable advanced degenerative changes at the sacroiliac joints. Stable advanced degenerative disc disease in the mid to lower lumbar spine. IMPRESSION: 1. No acute abnormality in the abdomen or pelvis. No evidence of bowel obstruction or acute bowel inflammation. Mild sigmoid diverticulosis, with no evidence of acute diverticulitis. 2. Cholelithiasis, with no evidence of acute cholecystitis. 3. Mild patchy tree-in-bud opacities in the lower lobes, indicative of a nonspecific mild infectious or inflammatory bronchiolitis. 4. Stable 3.2 cm infrarenal abdominal aortic aneurysm. Recommend followup by ultrasound in 3 years. This recommendation follows ACR consensus guidelines: White Paper of the ACR Incidental Findings Committee II on Vascular Findings. J Am Coll Radiol 2013; 57:322-025. 5.  Aortic Atherosclerosis (ICD10-I70.0). Electronically Signed   By: Ilona Sorrel M.D.   On: 01/26/2017 16:17   Dg Chest 2 View  Result Date: 01/26/2017 CLINICAL DATA:  Productive cough for several weeks EXAM: CHEST  2 VIEW COMPARISON:  11/12/2016 FINDINGS: The heart size and mediastinal contours are within normal limits. Both lungs are clear. The visualized skeletal structures are unremarkable. IMPRESSION: No active cardiopulmonary disease. Electronically Signed   By: Inez Catalina M.D.   On: 01/26/2017 16:27   US  Renal  Result Date: 01/27/2017 CLINICAL DATA:  Acute kidney injury. EXAM: RENAL / URINARY TRACT ULTRASOUND COMPLETE COMPARISON:  01/26/2017 CT scan. FINDINGS: Right Kidney: Length: 9.8 cm. Echogenicity within normal limits. No mass or hydronephrosis visualized. Left Kidney: Length: 9.9 cm. 15 mm hypoechoic lesion in the interpolar region suggest cysts, similar to previous CT. No hydronephrosis. Bladder: Appears normal for degree of bladder distention. Note: Incidental imaging of the gallbladder demonstrates cholelithiasis. IMPRESSION: No hydronephrosis. Electronically Signed   By: Misty Stanley M.D.   On: 01/27/2017 18:23   Dg Chest Port 1 View  Result Date: 01/28/2017 CLINICAL DATA:  Shortness of breath.  Anemia. EXAM: PORTABLE CHEST 1 VIEW COMPARISON:  01/26/2017 FINDINGS: Heart size is normal. There is aortic atherosclerosis. Lungs are clear. The vascularity is normal. Slight blunting of lateral costophrenic angles could represent tiny effusions. No acute bone finding. IMPRESSION: Little change since the study of 2 days ago. Slight blunting of the lateral costophrenic angles could indicate tiny effusions. Electronically Signed   By: Nelson Chimes M.D.   On: 01/28/2017 07:30   US Abdomen Limited Ruq  Result Date: 01/26/2017 CLINICAL DATA:  Abdominal pain and vomiting for 2 weeks. EXAM: ULTRASOUND ABDOMEN  LIMITED RIGHT UPPER QUADRANT COMPARISON:  CT scan 01/26/2017 FINDINGS: Gallbladder: Echogenic shadowing gallstone noted in the gallbladder and a small amount of gallbladder sludge. No gallbladder wall thickening, pericholecystic fluid or sonographic Murphy sign to suggest acute cholecystitis. Common bile duct: Diameter: 2.8 mm Liver: Normal echogenicity without focal lesion or biliary dilatation. Portal vein is patent on color Doppler imaging with normal direction of blood flow towards the liver. IMPRESSION: 1. Cholelithiasis without sonographic findings for acute cholecystitis. 2. Normal caliber  common bile duct. 3. Normal liver. Electronically Signed   By: Marijo Sanes M.D.   On: 01/26/2017 21:18    EGD    Subjective: Patient seen and examined after EGD at bed side. Remains comfortable.  No nausea/vomiting or abdominal pain.  She has been tolerating full liquids and Jell-O.  Patient is willing to be discharged and stable for that.  Discharge Exam: Vitals:   01/28/17 1000 01/28/17 1010  BP: (!) 97/33 (!) 97/52  Pulse: 95 85  Resp: 19 (!) 23  Temp:    SpO2: 98% 94%   Vitals:   01/28/17 0945 01/28/17 0955 01/28/17 1000 01/28/17 1010  BP: (!) 77/44 (!) 84/41 (!) 97/33 (!) 97/52  Pulse: 90 94 95 85  Resp: (!) 26 13 19  (!) 23  Temp:      TempSrc:      SpO2: 100% 98% 98% 94%  Weight:      Height:        General: Pt is alert, awake, not in acute distress Cardiovascular: RRR, S1/S2 +, no rubs, no gallops Respiratory: CTA bilaterally, no wheezing, no rhonchi Abdominal: Soft, NT, ND, bowel sounds + Extremities: no edema, no cyanosis    The results of significant diagnostics from this hospitalization (including imaging, microbiology, ancillary and laboratory) are listed below for reference.     Microbiology: No results found for this or any previous visit (from the past 240 hour(s)).   Labs: BNP (last 3 results) No results for input(s): BNP in the last 8760 hours. Basic Metabolic Panel: Recent Labs  Lab 01/26/17 1400 01/27/17 0401 01/28/17 0423  NA 139 144 143  K 4.5 5.0 4.9  CL 103 112* 114*  CO2 25 24 22   GLUCOSE 120* 77 77  BUN 66* 62* 40*  CREATININE 3.53* 2.91* 1.88*  CALCIUM 9.3 9.0 8.8*  MG  --   --  1.8  PHOS  --   --  4.0   Liver Function Tests: Recent Labs  Lab 01/26/17 1400 01/27/17 0401 01/28/17 0423  AST 28 23 27   ALT 17 15 16   ALKPHOS 88 78 80  BILITOT 0.8 0.8 0.6  PROT 7.8 6.7 6.5  ALBUMIN 3.2* 2.8* 2.8*   Recent Labs  Lab 01/26/17 1400  LIPASE 90*   No results for input(s): AMMONIA in the last 168 hours. CBC: Recent  Labs  Lab 01/26/17 1400 01/27/17 0401 01/28/17 0423  WBC 8.8 5.8 6.6  NEUTROABS  --   --  4.4  HGB 6.7* 8.8* 8.9*  HCT 20.5* 26.3* 26.7*  MCV 98.1 95.3 94.7  PLT 353 250 270   Cardiac Enzymes: No results for input(s): CKTOTAL, CKMB, CKMBINDEX, TROPONINI in the last 168 hours. BNP: Invalid input(s): POCBNP CBG: Recent Labs  Lab 01/27/17 0743 01/28/17 0742 01/28/17 0940  GLUCAP 74 74 84   D-Dimer No results for input(s): DDIMER in the last 72 hours. Hgb A1c Recent Labs    01/26/17 1900  HGBA1C 5.2   Lipid Profile No results for  input(s): CHOL, HDL, LDLCALC, TRIG, CHOLHDL, LDLDIRECT in the last 72 hours. Thyroid function studies Recent Labs    01/26/17 1908  TSH 0.282*   Anemia work up Recent Labs    01/26/17 1841  VITAMINB12 198  FOLATE 13.5  FERRITIN 256  TIBC 330  IRON 92  RETICCTPCT 2.4   Urinalysis    Component Value Date/Time   COLORURINE YELLOW 01/26/2017 1652   APPEARANCEUR HAZY (A) 01/26/2017 1652   LABSPEC 1.016 01/26/2017 1652   PHURINE 5.0 01/26/2017 1652   GLUCOSEU NEGATIVE 01/26/2017 1652   HGBUR MODERATE (A) 01/26/2017 [place] 01/26/2017 [place] 01/26/2017 1652   PROTEINUR 100 (A) 01/26/2017 1652   UROBILINOGEN 0.2 04/23/2007 1113   NITRITE NEGATIVE 01/26/2017 1652   LEUKOCYTESUR MODERATE (A) 01/26/2017 1652   Sepsis Labs Invalid input(s): PROCALCITONIN,  WBC,  LACTICIDVEN Microbiology No results found for this or any previous visit (from the past 240 hour(s)).   Time coordinating discharge: Over 30 minutes  SIGNED:   Marene Lenz, MD  [facility] 01/28/2017, 12:16 PM Pager [phone]  If 7PM-7AM, please contact night-coverage [email or web address] Password [email or web address]

## 2017-01-28 NOTE — Transfer of Care (Signed)
Immediate Anesthesia Transfer of Care Note  Patient: Kaitlin [name]  Procedure(s) Performed: ESOPHAGOGASTRODUODENOSCOPY (EGD) (N/A )  Patient Location: Endoscopy Unit  Anesthesia Type:MAC  Level of Consciousness: drowsy  Airway & Oxygen Therapy: Patient Spontanous Breathing and Patient connected to face mask oxygen  Post-op Assessment: Report given to RN and Post -op Vital signs reviewed and stable  Post vital signs: Reviewed and stable  Last Vitals:  Vitals:   01/28/17 0938 01/28/17 0942  BP: (!) 74/46   Pulse:    Resp:    Temp: 36.5 C   SpO2: (!) 82% (P) 100%    Last Pain:  Vitals:   01/28/17 0938  TempSrc: Oral  PainSc:          Complications: No apparent anesthesia complications

## 2017-01-28 NOTE — Anesthesia Preprocedure Evaluation (Signed)
Anesthesia Evaluation  Patient identified by MRN, date of birth, ID band Patient awake    Reviewed: Allergy & Precautions, H&P , NPO status , Patient's Chart, lab work & pertinent test results  Airway Mallampati: II   Neck ROM: full    Dental   Pulmonary COPD, Current Smoker,    breath sounds clear to auscultation       Cardiovascular hypertension, + CAD, + Past MI and + Peripheral Vascular Disease   Rhythm:regular Rate:Normal     Neuro/Psych    GI/Hepatic GERD  ,  Endo/Other    Renal/GU Renal InsufficiencyRenal disease     Musculoskeletal  (+) Arthritis ,   Abdominal   Peds  Hematology  (+) anemia ,   Anesthesia Other Findings   Reproductive/Obstetrics                             Anesthesia Physical Anesthesia Plan  ASA: III  Anesthesia Plan: MAC   Post-op Pain Management:    Induction: Intravenous  PONV Risk Score and Plan: 1 and Ondansetron, Propofol infusion and Treatment may vary due to age or medical condition  Airway Management Planned: Nasal Cannula  Additional Equipment:   Intra-op Plan:   Post-operative Plan:   Informed Consent: I have reviewed the patients History and Physical, chart, labs and discussed the procedure including the risks, benefits and alternatives for the proposed anesthesia with the patient or authorized representative who has indicated his/her understanding and acceptance.     Plan Discussed with: CRNA, Anesthesiologist and Surgeon  Anesthesia Plan Comments:         Anesthesia Quick Evaluation

## 2017-01-29 ENCOUNTER — Encounter (HOSPITAL_COMMUNITY): Payer: Self-pay | Admitting: Internal Medicine

## 2017-01-29 LAB — T3, FREE: T3, Free: 2.4 pg/mL (ref 2.0–4.4)

## 2017-01-30 ENCOUNTER — Ambulatory Visit (INDEPENDENT_AMBULATORY_CARE_PROVIDER_SITE_OTHER): Payer: Medicare Other | Admitting: Cardiology

## 2017-01-30 ENCOUNTER — Encounter: Payer: Self-pay | Admitting: Cardiology

## 2017-01-30 VITALS — BP 134/76 | HR 84 | Ht 61.0 in | Wt 124.0 lb

## 2017-01-30 DIAGNOSIS — Z79899 Other long term (current) drug therapy: Secondary | ICD-10-CM

## 2017-01-30 DIAGNOSIS — I6523 Occlusion and stenosis of bilateral carotid arteries: Secondary | ICD-10-CM

## 2017-01-30 DIAGNOSIS — I10 Essential (primary) hypertension: Secondary | ICD-10-CM

## 2017-01-30 DIAGNOSIS — I2510 Atherosclerotic heart disease of native coronary artery without angina pectoris: Secondary | ICD-10-CM

## 2017-01-30 DIAGNOSIS — E782 Mixed hyperlipidemia: Secondary | ICD-10-CM | POA: Diagnosis not present

## 2017-01-30 NOTE — Progress Notes (Signed)
Clinical Summary Kaitlin [name] is a 67 y.o.female seen today for follow up of the following medical problems.   1. CAD  - w/ DES to LAD and BMSx2 to RCA Jan 2007, reported normal LV function from notes.  - the RCA PCI was complicated by dissection which was also stented    - no recent chest pain. No SOB or DOE - compliant with meds  2. PAD  - 60-70% right external iliac artery stenosis by previous cath   - denies anyclaudication pain in legs. No foot sores   3. Carotid stenosis  - prior right carotid endarterectomy   - repeat US 06/2014 moderate bilateral disease with patent CEA site - no recent neuro symptoms  - 05/8525 carotid US: LICA 78-24%, RICA <23%.    4. HTN  - compliant w/ meds   - meds held during 12/2016 where she presented with N/V and dehydration.    5. Hyperlipidemia  - compliant with statin   - reports recent labs with pcp  6. AAA 07/2014 small AAA 3 x 3.2 cm.   7. Intractable N/V - recent admission 12/2016 with symptoms, seen by GI. EGD with gastric inflammation,started on PPI bid. AKI during that admit that resolved with IVFs.    8. 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 Spends most of time with great grandchildren (9 months, 2.5 years)   Past Medical History:  Diagnosis Date  . Arthritis   . CAD (coronary artery disease)    stents  . COPD (chronic obstructive pulmonary disease) ([place])   . Emphysema of lung ([place])   . GERD (gastroesophageal reflux disease)   . Hyperlipidemia   . Hypertension   . Iliac artery stenosis, right (HCC)    60%-70%  . Myocardial infarction ([place])   . Normocytic anemia 01/26/2017  . PAD (peripheral artery disease) ([place]) 12/14/2010   in the left vertebral, bilateral carotids     No Known Allergies   Current Outpatient Medications  Medication Sig Dispense Refill  . aspirin 81 MG tablet Take 81 mg by mouth daily.    . clopidogrel (PLAVIX) 75 MG  tablet TAKE ONE (1) TABLET BY MOUTH EVERY DAY (Patient taking differently: TAKE ONE TABLET(75mg ) BY MOUTH EVERY DAY) 90 tablet 2  . fish oil-omega-3 fatty acids 1000 MG capsule Take 1 g by mouth daily as needed (supplemental).     . metoprolol succinate (TOPROL-XL) 25 MG 24 hr tablet TAKE ONE-HALF TABLET BY MOUTH DAILY. (Patient taking differently: TAKE ONE-HALF TABLET(12.5mg ) BY MOUTH DAILY.) 45 tablet 2  . nitroGLYCERIN (NITROSTAT) 0.4 MG SL tablet Place 1 tablet (0.4 mg total) under the tongue every 5 (five) minutes as needed for chest pain. 25 tablet 3  . pantoprazole (PROTONIX) 40 MG tablet Take 1 tablet (40 mg total) by mouth 2 (two) times daily before a meal. 60 tablet 0  . rosuvastatin (CRESTOR) 40 MG tablet TAKE ONE (1) TABLET BY MOUTH EVERY DAY (Patient taking differently: TAKE ONE TABLET(40mg ) BY MOUTH EVERY DAY) 90 tablet 2   No current facility-administered medications for this visit.      Past Surgical History:  Procedure Laterality Date  . CARDIAC CATHETERIZATION  02/04/2005   A cypher 5.3 x 18 mm at 16 atmosphere of pressure in the mid LAD, this stent covered the proximal part of the LAD and also the mid LAD, this was then post dilated with 3.25 x 15 mm Quantum at 16 atmosphereof pressure for  45 seconds  . ESOPHAGOGASTRODUODENOSCOPY N/A 01/28/2017   Procedure: ESOPHAGOGASTRODUODENOSCOPY (EGD);  Surgeon: Jerene Bears, MD;  Location[facility] ENDOSCOPY;  Service: Gastroenterology;  Laterality: N/A;  . TUBAL LIGATION       No Known Allergies    Family History  Problem Relation Age of Onset  . Heart attack Mother   . Hyperlipidemia Mother   . Hypertension Mother   . Diabetes Mother   . Heart attack Father   . Early death Father 98  . Hyperlipidemia Father   . Hypertension Father   . Heart disease Sister   . Hyperlipidemia Sister   . Hypertension Sister   . Diabetes Brother   . Heart disease Sister   . Hyperlipidemia Sister   . Hypertension Sister   . Heart attack  Sister   . Heart disease Sister   . Hyperlipidemia Sister   . Hypertension Sister   . Heart attack Brother   . Early death Brother 17  . Hyperlipidemia Brother   . Hypertension Brother   . Heart attack Brother   . Early death Brother 75  . Hyperlipidemia Brother   . Hypertension Brother   . Cancer Maternal Aunt      Social History Ms. Driggs reports that she has been smoking cigarettes.  She started smoking about 50 years ago. She has a 49.00 pack-year smoking history. She has quit using smokeless tobacco. Ms. Beverley reports that she does not drink alcohol.   Review of Systems CONSTITUTIONAL: No weight loss, fever, chills, weakness or fatigue.  HEENT: Eyes: No visual loss, blurred vision, double vision or yellow sclerae.No hearing loss, sneezing, congestion, runny nose or sore throat.  SKIN: No rash or itching.  CARDIOVASCULAR: per hpi RESPIRATORY: No shortness of breath, cough or sputum.  GASTROINTESTINAL: No anorexia, nausea, vomiting or diarrhea. No abdominal pain or blood.  GENITOURINARY: No burning on urination, no polyuria NEUROLOGICAL: No headache, dizziness, syncope, paralysis, ataxia, numbness or tingling in the extremities. No change in bowel or bladder control.  MUSCULOSKELETAL: No muscle, back pain, joint pain or stiffness.  LYMPHATICS: No enlarged nodes. No history of splenectomy.  PSYCHIATRIC: No history of depression or anxiety.  ENDOCRINOLOGIC: No reports of sweating, cold or heat intolerance. No polyuria or polydipsia.  Marland Kitchen   Physical Examination Vitals:   01/30/17 1337  BP: 134/76  Pulse: 84  SpO2: 91%   Vitals:   01/30/17 1337  Weight: 124 lb (56.2 kg)  Height: 5\' 1"  (1.549 m)    Gen: resting comfortably, no acute distress HEENT: no scleral icterus, pupils equal round and reactive, no palptable cervical adenopathy,  CV: RRR, no m/r/g, no jvd Resp: Clear to auscultation bilaterally GI: abdomen is soft, non-tender, non-distended, normal bowel  sounds, no hepatosplenomegaly MSK: extremities are warm, no edema.  Skin: warm, no rash Neuro:  no focal deficits Psych: appropriate affect   Diagnostic Studies  07/2014 Carotid US - 40-59% bilateral diseae, right CEA is patent.   07/2014 AAA [place] 3 x 3.2 aneurysm  05/2016 Carotid US IMPRESSION: Less than 50% stenosis in the right internal carotid artery.  50-69% stenosis in the left internal carotid artery.   Assessment and Plan   1. CAD - no symptoms, continue current meds  2. HTN - lisinopril held after recent admission with N/V and AKI - repeat labs, restart ACE-I pending renal function  3. HL -  continue current meds  4. Carotid stenosis - moderate bilateral disease - we will continue to monitor  Arnoldo Lenis, M.D.

## 2017-01-30 NOTE — Patient Instructions (Signed)
Medication Instructions:  Your physician recommends that you continue on your current medications as directed. Please refer to the Current Medication list given to you today.'  Labwork: 1 WEEK  CBC BMET Mgnesium  Testing/Procedures: none  Follow-Up: Your physician wants you to follow-up in: 6 months.  You will receive a reminder letter in the mail two months in advance. If you don't receive a letter, please call our office to schedule the follow-up appointment.   Any Other Special Instructions Will Be Listed Below (If Applicable).     If you need a refill on your cardiac medications before your next appointment, please call your pharmacy.

## 2017-01-30 NOTE — Anesthesia Postprocedure Evaluation (Signed)
Anesthesia Post Note  Patient: Kaitlin [name]  Procedure(s) Performed: ESOPHAGOGASTRODUODENOSCOPY (EGD) (N/A )     Patient location during evaluation: PACU Anesthesia Type: MAC Level of consciousness: awake and alert Pain management: pain level controlled Vital Signs Assessment: post-procedure vital signs reviewed and stable Respiratory status: spontaneous breathing, nonlabored ventilation, respiratory function stable and patient connected to nasal cannula oxygen Cardiovascular status: stable and blood pressure returned to baseline Postop Assessment: no apparent nausea or vomiting Anesthetic complications: no    Last Vitals:  Vitals:   01/28/17 1413 01/28/17 1416  BP: 97/62   Pulse:  80  Resp:    Temp:  36.7 C  SpO2:  97%    Last Pain:  Vitals:   01/28/17 1416  TempSrc: Oral  PainSc:                  Gaylord S

## 2017-02-01 ENCOUNTER — Encounter: Payer: Self-pay | Admitting: Internal Medicine

## 2017-02-04 ENCOUNTER — Other Ambulatory Visit: Payer: Self-pay

## 2017-02-04 ENCOUNTER — Encounter: Payer: Self-pay | Admitting: Family Medicine

## 2017-02-04 ENCOUNTER — Ambulatory Visit (INDEPENDENT_AMBULATORY_CARE_PROVIDER_SITE_OTHER): Payer: Medicare Other | Admitting: Family Medicine

## 2017-02-04 VITALS — BP 98/44 | HR 96 | Temp 97.5°F | Resp 18 | Ht 61.0 in | Wt 126.0 lb

## 2017-02-04 DIAGNOSIS — Z79899 Other long term (current) drug therapy: Secondary | ICD-10-CM | POA: Diagnosis not present

## 2017-02-04 DIAGNOSIS — K2901 Acute gastritis with bleeding: Secondary | ICD-10-CM

## 2017-02-04 DIAGNOSIS — N289 Disorder of kidney and ureter, unspecified: Secondary | ICD-10-CM

## 2017-02-04 DIAGNOSIS — D649 Anemia, unspecified: Secondary | ICD-10-CM | POA: Diagnosis not present

## 2017-02-04 DIAGNOSIS — D500 Iron deficiency anemia secondary to blood loss (chronic): Secondary | ICD-10-CM

## 2017-02-04 NOTE — Progress Notes (Signed)
Chief Complaint  Patient presents with  . Transitions Of Care   Kaitlin [name] is here for a transitional visit.  She was hospitalized for nausea and vomiting.  She was found to have a hemoglobin of 6.  She was dehydrated and had acute kidney injury.  She had fluid resuscitation and blood transfusion.  She had an endoscopy, x-rays CAT scans and lab work.  Her chart is reviewed, and her test results are discussed with her today.  She was found to have gastritis.  She was started on Protonix 40 mg twice daily.  She was sent home on a bland diet. She has been doing reasonably well at home but still feels very tired.  She has been tolerating her diet and fluids.  Today, she tried to go back to work.  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  She states that as soon as she stood up and was working at a [employer], she had a wave of sweats and nausea and then vomited.  This is the first time she has felt sick since she went home from the hospital.  She has been compliant with the twice daily Protonix.  No NSAID medicines.  No alcohol.  She did not see any blood in the emesis.  She has not had any dark or black stools. Patient had hypotension in the hospital and her metoprolol was stopped.  She has hypotension again today.  She does have mild orthostatic symptoms and states if she stands quickly she feels briefly lightheaded.  She has not fainted or fallen.  Patient Active Problem List   Diagnosis Date Noted  . Early satiety   . Gastritis without bleeding   . Intractable nausea and vomiting 01/26/2017  . Generalized weakness 01/26/2017  . Fatigue 01/26/2017  . RUQ pain 01/26/2017  . Normocytic anemia 01/26/2017  . AKI (acute kidney injury) ([place]) 01/26/2017  . Emphysema lung ([place]) 11/13/2016  . Atherosclerosis of aorta ([place]) 11/13/2016  . AAA (abdominal aortic aneurysm) ([place]) 10/31/2016  . Nicotine dependence 10/25/2016  . CAD (coronary artery disease) of  artery bypass graft 10/17/2012  . PAD (peripheral artery disease) ([place]) 10/17/2012  . Carotid stenosis 10/17/2012  . HTN (hypertension) 10/17/2012  . Hyperlipidemia 10/17/2012  . Tobacco abuse 10/17/2012    Outpatient Encounter Medications as of 02/04/2017  Medication Sig  . aspirin 81 MG tablet Take 81 mg by mouth daily.  . clopidogrel (PLAVIX) 75 MG tablet TAKE ONE (1) TABLET BY MOUTH EVERY DAY (Patient taking differently: TAKE ONE TABLET(75mg ) BY MOUTH EVERY DAY)  . fish oil-omega-3 fatty acids 1000 MG capsule Take 1 g by mouth daily as needed (supplemental).   . nitroGLYCERIN (NITROSTAT) 0.4 MG SL tablet Place 1 tablet (0.4 mg total) under the tongue every 5 (five) minutes as needed for chest pain.  . pantoprazole (PROTONIX) 40 MG tablet Take 1 tablet (40 mg total) by mouth 2 (two) times daily before a meal.  . rosuvastatin (CRESTOR) 40 MG tablet TAKE ONE (1) TABLET BY MOUTH EVERY DAY (Patient taking differently: TAKE ONE TABLET(40mg ) BY MOUTH EVERY DAY)  . [DISCONTINUED] metoprolol succinate (TOPROL-XL) 25 MG 24 hr tablet TAKE ONE-HALF TABLET BY MOUTH DAILY. (Patient not taking: No sig reported)   No facility-administered encounter medications on file as of 02/04/2017.     No Known Allergies  Review of Systems  Constitutional: Positive for diaphoresis. Negative for activity change, appetite change and unexpected  weight change.  HENT: Negative for congestion, dental problem, postnasal drip and rhinorrhea.   Eyes: Negative for redness and visual disturbance.  Respiratory: Negative for cough and shortness of breath.   Cardiovascular: Negative for chest pain, palpitations and leg swelling.  Gastrointestinal: Positive for abdominal pain, nausea and vomiting. Negative for constipation and diarrhea.       Early satiety  Genitourinary: Negative for difficulty urinating, frequency and vaginal bleeding.  Musculoskeletal: Negative for arthralgias and back pain.  Neurological: Positive for  light-headedness. Negative for dizziness and headaches.  Psychiatric/Behavioral: Negative for dysphoric mood and sleep disturbance. The patient is not nervous/anxious.     BP (!) 98/44 (BP Location: Left Arm, Patient Position: Sitting, Cuff Size: Normal)   Pulse 96   Temp (!) 97.5 F (36.4 C) (Temporal)   Resp 18   Ht 5\' 1"  (1.549 m)   Wt 126 lb 0.6 oz (57.2 kg)   SpO2 100%   BMI 23.82 kg/m   Physical Exam  Constitutional: She is oriented to person, place, and time. She appears well-developed and well-nourished.  Appears tired  HENT:  Head: Normocephalic and atraumatic.  Right Ear: External ear normal.  Left Ear: External ear normal.  Mouth/Throat: Oropharynx is clear and moist.  Eyes: Conjunctivae are normal. Pupils are equal, round, and reactive to light.  Neck: Normal range of motion. Neck supple. No thyromegaly present.  Cardiovascular: Normal rate, regular rhythm and normal heart sounds.  Pulmonary/Chest: Effort normal and breath sounds normal. No respiratory distress.  Abdominal: Soft. Bowel sounds are normal. There is tenderness.  Midepigastric tenderness with no guarding or rebound.  No organomegaly  Musculoskeletal: Normal range of motion. She exhibits no edema.  Lymphadenopathy:    She has no cervical adenopathy.  Neurological: She is alert and oriented to person, place, and time.  Skin: Skin is warm and dry. There is pallor.  Psychiatric: She has a normal mood and affect. Her behavior is normal. Thought content normal.  Nursing note and vitals reviewed.   ASSESSMENT/PLAN:  1. Acute superficial gastritis with hemorrhage - Ambulatory referral to Gastroenterology   2. Blood loss anemia - CBC with Differential/Platelet  3. Acute renal disease - Basic metabolic panel   4. Normocytic anemia  I have concerned that she is having continued hypotension and vomiting a week after her discharge from the hospital.  He states she is trying to eat frequent small meals.   She is drinking a lot of water.  I am repeating her blood work today.  I am arranging an urgent GI consultation.  I have advised her not to work through rest of the week.  I did advise her to go back to the emergency room promptly for any worsening symptoms.  Patient Instructions  Take a multivitamin with iron Eat bland diet Avoid aleve/ibuprofen products Continue the protonix Need blood work today I will send you a letter with your test results.  If there is anything of concern, we will call right away. Referring to GI See me in a couple of weeks   Raylene Everts, MD

## 2017-02-04 NOTE — Patient Instructions (Signed)
Take a multivitamin with iron Eat bland diet Avoid aleve/ibuprofen products Continue the protonix Need blood work today I will send you a letter with your test results.  If there is anything of concern, we will call right away. Referring to GI See me in a couple of weeks

## 2017-02-05 ENCOUNTER — Encounter: Payer: Self-pay | Admitting: Cardiology

## 2017-02-05 LAB — BASIC METABOLIC PANEL
BUN / CREAT RATIO: 18 (calc) (ref 6–22)
BUN: 35 mg/dL — ABNORMAL HIGH (ref 7–25)
CALCIUM: 8.7 mg/dL (ref 8.6–10.4)
CHLORIDE: 106 mmol/L (ref 98–110)
CO2: 23 mmol/L (ref 20–32)
Creat: 2 mg/dL — ABNORMAL HIGH (ref 0.50–0.99)
GLUCOSE: 123 mg/dL (ref 65–139)
POTASSIUM: 4.3 mmol/L (ref 3.5–5.3)
SODIUM: 139 mmol/L (ref 135–146)

## 2017-02-05 LAB — MAGNESIUM: MAGNESIUM: 2.3 mg/dL (ref 1.5–2.5)

## 2017-02-05 LAB — CBC WITH DIFFERENTIAL/PLATELET
BASOS ABS: 48 {cells}/uL (ref 0–200)
Basophils Relative: 0.7 %
EOS ABS: 21 {cells}/uL (ref 15–500)
EOS PCT: 0.3 %
HCT: 22.6 % — ABNORMAL LOW (ref 35.0–45.0)
HEMOGLOBIN: 7.7 g/dL — AB (ref 11.7–15.5)
Lymphs Abs: 918 cells/uL (ref 850–3900)
MCH: 31.6 pg (ref 27.0–33.0)
MCHC: 34.1 g/dL (ref 32.0–36.0)
MCV: 92.6 fL (ref 80.0–100.0)
MONOS PCT: 3 %
MPV: 10.6 fL (ref 7.5–12.5)
NEUTROS PCT: 82.7 %
Neutro Abs: 5706 cells/uL (ref 1500–7800)
Platelets: 216 10*3/uL (ref 140–400)
RBC: 2.44 10*6/uL — ABNORMAL LOW (ref 3.80–5.10)
RDW: 14.4 % (ref 11.0–15.0)
Total Lymphocyte: 13.3 %
WBC mixed population: 207 cells/uL (ref 200–950)
WBC: 6.9 10*3/uL (ref 3.8–10.8)

## 2017-02-08 ENCOUNTER — Telehealth: Payer: Self-pay | Admitting: Cardiology

## 2017-02-08 NOTE — Telephone Encounter (Signed)
I will forward to Dr Harl Bowie to result to [place]

## 2017-02-08 NOTE — Telephone Encounter (Signed)
Lab results / tg

## 2017-02-11 ENCOUNTER — Encounter: Payer: Self-pay | Admitting: Gastroenterology

## 2017-02-11 NOTE — Telephone Encounter (Signed)
Patient informed of lab results, will call pcp regarding GI referral.She will call us after she has GI consult

## 2017-02-11 NOTE — Telephone Encounter (Signed)
-----   Message from Arnoldo Lenis, MD sent at 02/11/2017 11:07 AM EST ----- Labs show kidney function stbale since discharge, but still not normal. More anemic since discharge, I agree with Dr Meda Coffee getting her in with GI. SHe should stop her aspirin and plavix until she sees GI, labs should she is losing blood somewhere. Please ask her to call us after she sees GI so we can f/u with there recs   J BrancH MD

## 2017-02-11 NOTE — Telephone Encounter (Signed)
Result note sent to [facility] today   Zandra Abts MD

## 2017-02-12 ENCOUNTER — Ambulatory Visit (INDEPENDENT_AMBULATORY_CARE_PROVIDER_SITE_OTHER): Payer: Medicare Other | Admitting: Nurse Practitioner

## 2017-02-12 ENCOUNTER — Encounter: Payer: Self-pay | Admitting: Nurse Practitioner

## 2017-02-12 ENCOUNTER — Encounter: Payer: Self-pay | Admitting: *Deleted

## 2017-02-12 ENCOUNTER — Other Ambulatory Visit: Payer: Self-pay | Admitting: *Deleted

## 2017-02-12 VITALS — BP 102/60 | HR 104 | Temp 96.8°F | Ht 61.0 in | Wt 125.4 lb

## 2017-02-12 DIAGNOSIS — K2970 Gastritis, unspecified, without bleeding: Secondary | ICD-10-CM

## 2017-02-12 DIAGNOSIS — I6523 Occlusion and stenosis of bilateral carotid arteries: Secondary | ICD-10-CM

## 2017-02-12 DIAGNOSIS — D649 Anemia, unspecified: Secondary | ICD-10-CM

## 2017-02-12 LAB — BASIC METABOLIC PANEL
BUN / CREAT RATIO: 15 (calc) (ref 6–22)
BUN: 25 mg/dL (ref 7–25)
CHLORIDE: 109 mmol/L (ref 98–110)
CO2: 25 mmol/L (ref 20–32)
CREATININE: 1.69 mg/dL — AB (ref 0.50–0.99)
Calcium: 9.1 mg/dL (ref 8.6–10.4)
Glucose, Bld: 85 mg/dL (ref 65–99)
POTASSIUM: 4.6 mmol/L (ref 3.5–5.3)
Sodium: 142 mmol/L (ref 135–146)

## 2017-02-12 LAB — CBC WITH DIFFERENTIAL/PLATELET
BASOS PCT: 1.2 %
Basophils Absolute: 52 cells/uL (ref 0–200)
Eosinophils Absolute: 142 cells/uL (ref 15–500)
Eosinophils Relative: 3.3 %
HEMATOCRIT: 23.3 % — AB (ref 35.0–45.0)
Hemoglobin: 7.9 g/dL — ABNORMAL LOW (ref 11.7–15.5)
LYMPHS ABS: 1574 {cells}/uL (ref 850–3900)
MCH: 31.7 pg (ref 27.0–33.0)
MCHC: 33.9 g/dL (ref 32.0–36.0)
MCV: 93.6 fL (ref 80.0–100.0)
MPV: 9.7 fL (ref 7.5–12.5)
Monocytes Relative: 6.6 %
NEUTROS PCT: 52.3 %
Neutro Abs: 2249 cells/uL (ref 1500–7800)
Platelets: 239 10*3/uL (ref 140–400)
RBC: 2.49 10*6/uL — AB (ref 3.80–5.10)
RDW: 15.8 % — AB (ref 11.0–15.0)
Total Lymphocyte: 36.6 %
WBC: 4.3 10*3/uL (ref 3.8–10.8)
WBCMIX: 284 {cells}/uL (ref 200–950)

## 2017-02-12 MED ORDER — NA SULFATE-K SULFATE-MG SULF 17.5-3.13-1.6 GM/177ML PO SOLN: 1.0000 | ORAL | 0 refills | Status: DC

## 2017-02-12 NOTE — Assessment & Plan Note (Signed)
Her gastritis symptoms seem to be improving nicely on twice a day PPI.  I recommend she continue this for about 3 months and then we can consider down dosing.  I will check a BMP today for kidney function.  Return for follow-up in 2 months.

## 2017-02-12 NOTE — Addendum Note (Signed)
Addended by: Gordy Levan, Olubunmi Rothenberger A on: 02/12/2017 08:51 AM   Modules accepted: Orders

## 2017-02-12 NOTE — Progress Notes (Signed)
cc'ed to pcp °

## 2017-02-12 NOTE — Progress Notes (Addendum)
REVIEWED-NO ADDITIONAL RECOMMENDATIONS.  Primary Care Physician:  Raylene Everts, MD Primary Gastroenterologist:  Dr. Oneida Alar  Chief Complaint  Patient presents with  . Anemia    blood this morning when blew nose  . gastritis    HPI:   Kaitlin [name] is a 67 y.o. female who presents on referral from [facility] for post hospital vision follow-up.  She was admitted to [facility] from 01/26/2017 through 01/28/2017.  She presented with intermittent nausea and vomiting and inability to keep down food and fluids.  Some right upper quadrant pain as well and dizziness when walking.  Denied blood in stools, denies NSAIDs.  Indicates early satiety.  Noted to have symptomatic anemia.  She was evaluated by GI and underwent upper endoscopy.  Results as outlined below.  She was started on PPI twice daily, tolerated clear liquid diet and was subsequently discharged home with recommendations to avoid NSAIDs, continue PPI twice daily, resume aspirin and Plavix.  EGD was completed 01/28/2017 by Dr. Hilarie Fredrickson which found nodular mucosa at the GE junction status post biopsy, gastritis status post biopsy, normal duodenum.  Recommended avoid NSAIDs and begin Protonix 40 mg twice a day.  They indicate if no improvement with acid suppression therapy then consider surgical consultation for cholecystectomy given presence of gallstones and recent symptoms.  Surgical pathology found the esophageal biopsies to be changes consistent with reflux.  Biopsies of the stomach found to be mild chronic gastritis negative for H. Pylori.  During her hospitalization her hemoglobin was 6.7.  She appears to have been transfused about 2 units.  She had an appropriate rise in hemoglobin to 8.8 which remained stable on day of discharge at 8.9.  Her CBC was rechecked 8 days ago and her hemoglobin has drifted down to 7.7.  She appears to be normocytic.  No history of colonoscopy in our system.  Today she states she's  doing ok overall. Nausea and vomiting has improved on bid Protonix. Did have an episode of RLQ " Burning/numbness" this morning. No other abdominal pain. Denies any other GERD symptoms. Previously used to throw up a lot "sour water." Denies hematochezia, melena, fever, chills, unintentional weight loss. She has never had a colonoscopy before. She states she's never known of a history of anemia. Still with some lightheadedness. Denies chest pain and dyspnea. Denies chest pain, dyspnea, dizziness, lightheadedness, syncope, near syncope. Denies any other upper or lower GI symptoms.  She was just recently taken off Plavix, which she was on for history of coronary stenting (2007).  Past Medical History:  Diagnosis Date  . Arthritis   . CAD (coronary artery disease)    stents  . COPD (chronic obstructive pulmonary disease) ([place])   . Emphysema of lung ([place])   . GERD (gastroesophageal reflux disease)   . Hyperlipidemia   . Hypertension   . Iliac artery stenosis, right ([facility])    60%-70%  . Myocardial infarction ([place])   . Normocytic anemia 01/26/2017  . PAD (peripheral artery disease) ([place]) 12/14/2010   in the left vertebral, bilateral carotids    Past Surgical History:  Procedure Laterality Date  . CARDIAC CATHETERIZATION  02/04/2005   A cypher 5.3 x 18 mm at 16 atmosphere of pressure in the mid LAD, this stent covered the proximal part of the LAD and also the mid LAD, this was then post dilated with 3.25 x 15 mm Quantum at 16 atmosphereof pressure for 45 seconds  . ESOPHAGOGASTRODUODENOSCOPY N/A 01/28/2017   Procedure:  ESOPHAGOGASTRODUODENOSCOPY (EGD);  Surgeon: Jerene Bears, MD;  Location: [facility] ENDOSCOPY;  Service: Gastroenterology;  Laterality: N/A;  . TUBAL LIGATION      Current Outpatient Medications  Medication Sig Dispense Refill  . fish oil-omega-3 fatty acids 1000 MG capsule Take 1 g by mouth daily as needed (supplemental).     . nitroGLYCERIN (NITROSTAT) 0.4 MG SL tablet Place 1  tablet (0.4 mg total) under the tongue every 5 (five) minutes as needed for chest pain. 25 tablet 3  . pantoprazole (PROTONIX) 40 MG tablet Take 1 tablet (40 mg total) by mouth 2 (two) times daily before a meal. 60 tablet 0  . rosuvastatin (CRESTOR) 40 MG tablet TAKE ONE (1) TABLET BY MOUTH EVERY DAY (Patient taking differently: TAKE ONE TABLET(40mg ) BY MOUTH EVERY DAY) 90 tablet 2   No current facility-administered medications for this visit.     Allergies as of 02/12/2017  . (No Known Allergies)    Family History  Problem Relation Age of Onset  . Heart attack Mother   . Hyperlipidemia Mother   . Hypertension Mother   . Diabetes Mother   . Heart attack Father   . Early death Father 80  . Hyperlipidemia Father   . Hypertension Father   . Heart disease Sister   . Hyperlipidemia Sister   . Hypertension Sister   . Diabetes Brother   . Heart disease Sister   . Hyperlipidemia Sister   . Hypertension Sister   . Heart attack Sister   . Heart disease Sister   . Hyperlipidemia Sister   . Hypertension Sister   . Heart attack Brother   . Early death Brother 39  . Hyperlipidemia Brother   . Hypertension Brother   . Heart attack Brother   . Early death Brother 27  . Hyperlipidemia Brother   . Hypertension Brother   . Cancer Maternal Aunt   . Colon cancer Neg Hx     Social History   Socioeconomic History  . Marital status: Married    Spouse name: Marcello Moores  . Number of children: 3  . Years of education: 47  . Highest education level: Not on file  Social Needs  . Financial resource strain: Not on file  . Food insecurity - worry: Not on file  . Food insecurity - inability: Not on file  . Transportation needs - medical: Not on file  . Transportation needs - non-medical: Not on file  Occupational History  . Occupation: RETIRED    Comment: warehouse/textiles  Tobacco Use  . Smoking status: Former Smoker    Packs/day: 1.00    Years: 49.00    Pack years: 49.00    Types:  Cigarettes    Start date: 02/09/1967    Last attempt to quit: 12/30/2016    Years since quitting: 0.1  . Smokeless tobacco: Never Used  Substance and Sexual Activity  . Alcohol use: No  . Drug use: No  . Sexual activity: Yes    Birth control/protection: Post-menopausal  Other Topics Concern  . Not on file  Social History Narrative   Retired   Has an [place] in [employer] with husband Marcello Moores for 23 years   Stays busy with home, gardens, likes to can    Review of Systems: Complete ROS negative except as per HPI.    Physical Exam: BP 102/60   Pulse (!) 104   Temp (!) 96.8 F (36 C) (Oral)   Ht 5\' 1"  (1.549 m)   Wt  125 lb 6.4 oz (56.9 kg)   BMI 23.69 kg/m  General:   Alert and oriented. Pleasant and cooperative. Well-nourished and well-developed.  Eyes:  Without icterus, sclera clear and conjunctiva pink.  Ears:  Normal auditory acuity. Cardiovascular:  S1, S2 present without murmurs appreciated. Extremities without clubbing or edema. Respiratory:  Clear to auscultation bilaterally. No wheezes, rales, or rhonchi. No distress.  Gastrointestinal:  +BS, soft, non-tender and non-distended. No HSM noted. No guarding or rebound. No masses appreciated.  Rectal:  Deferred  Musculoskalatal:  Symmetrical without gross deformities. Neurologic:  Alert and oriented x4;  grossly normal neurologically. Psych:  Alert and cooperative. Normal mood and affect. Heme/Lymph/Immune: No excessive bruising noted.    02/12/2017 8:35 AM   Disclaimer: This note was dictated with voice recognition software. Similar sounding words can inadvertently be transcribed and may not be corrected upon review.

## 2017-02-12 NOTE — Patient Instructions (Signed)
1. Of your blood work done today.  We will call you with the results. 2. Depending on your blood work, he may need a transfusion.  We will contact you and let you know when we call you about your results. 3. We will schedule your colonoscopy for you. 4. Return for follow-up in 2 months. 5. Call us if you have any obvious bleeding, worsening symptoms of anemia including lightheadedness, dizziness, chest pain, shortness of breath, passing out, nearly passing out.  Alternatively, you could proceed to the emergency department for any of the symptoms as well. 6. Call us if you have any questions or concerns.

## 2017-02-12 NOTE — Assessment & Plan Note (Signed)
She was noted during her hospitalization to have significant anemia with a hemoglobin as low as 6.7.  She was transfused.  She was discharged on or about 31 December.  In 7 days time, when she followed up with her primary care, her hemoglobin dropped from 8.8-7.7.  It is been another 7 days.  She does have some persistent lightheadedness.  I will check a stat CBC to see where her hemoglobin is at.  If it is dropped much further and 7.7 we will send her for a PRBC transfusion.  Of note her anemia is normocytic and likely not due to chronic blood loss.  She has not seen any significant bleeding.  She does not have kidney disease that she is aware of or that is in her record.  Query other possible sources of anemia.  EGD with findings of simple gastritis that have resolved on PPI twice daily.  She has never had a colonoscopy and is 67 years old.  No family history of colon cancer.  At this point she needs to undergo colonoscopy for further evaluation.  Return for follow-up in 2 months.  Proceed with colonoscopy with Dr. Oneida Alar in the near future. The risks, benefits, and alternatives have been discussed in detail with the patient. They state understanding and desire to proceed.   The patient is not on any anticoagulants, anxiolytics, chronic pain medications, or antidepressants.  Conscious sedation should be adequate for her procedure.

## 2017-02-14 NOTE — Progress Notes (Signed)
Pt is  Aware of results and plan.

## 2017-02-25 ENCOUNTER — Ambulatory Visit: Payer: Medicare Other | Admitting: Family Medicine

## 2017-02-28 ENCOUNTER — Other Ambulatory Visit: Payer: Self-pay

## 2017-02-28 ENCOUNTER — Ambulatory Visit: Payer: Medicare Other | Admitting: Family Medicine

## 2017-02-28 ENCOUNTER — Ambulatory Visit (INDEPENDENT_AMBULATORY_CARE_PROVIDER_SITE_OTHER): Payer: Medicare Other | Admitting: Family Medicine

## 2017-02-28 ENCOUNTER — Encounter: Payer: Self-pay | Admitting: Family Medicine

## 2017-02-28 VITALS — BP 110/54 | HR 88 | Temp 97.9°F | Resp 16 | Ht 61.0 in | Wt 124.2 lb

## 2017-02-28 DIAGNOSIS — D500 Iron deficiency anemia secondary to blood loss (chronic): Secondary | ICD-10-CM | POA: Diagnosis not present

## 2017-02-28 DIAGNOSIS — K2901 Acute gastritis with bleeding: Secondary | ICD-10-CM | POA: Diagnosis not present

## 2017-02-28 DIAGNOSIS — N289 Disorder of kidney and ureter, unspecified: Secondary | ICD-10-CM | POA: Diagnosis not present

## 2017-02-28 DIAGNOSIS — I10 Essential (primary) hypertension: Secondary | ICD-10-CM

## 2017-02-28 NOTE — Progress Notes (Signed)
Chief Complaint  Patient presents with  . Follow-up  Patient is here for follow-up.  She has been seen by gastroenterology.  She is Artie started a colonoscopy prep.  The colonoscopy is scheduled for tomorrow.  After her colonoscopy she is scheduled to see nephrology.  She tells me she does not think she needs this appointment.  I told her that with her hospitalization at the end of December, her GFR dropped to 13.  If nothing else, to prevent this from happening again, she did not needs a nephrology visit for consultation.  She may not need to go regularly, but I would like his opinion.  She agrees to go. She thinks she is feeling better.  She is slowly getting her strength back.  No dizziness or drowsiness.  She is still somewhat pale.  Her blood pressure is low.  She is not tachycardic today.  She is drinking lots of water. She continues taking her pantoprazole.  She does not have any heartburn or GERD.  She has normal bowel.  No black bowels or blood in her bowels.  Weight is stable.  Patient Active Problem List   Diagnosis Date Noted  . Early satiety   . Gastritis without bleeding   . Intractable nausea and vomiting 01/26/2017  . Generalized weakness 01/26/2017  . Fatigue 01/26/2017  . RUQ pain 01/26/2017  . Normocytic anemia 01/26/2017  . AKI (acute kidney injury) ([place]) 01/26/2017  . Emphysema lung ([place]) 11/13/2016  . Atherosclerosis of aorta ([place]) 11/13/2016  . AAA (abdominal aortic aneurysm) ([place]) 10/31/2016  . Nicotine dependence 10/25/2016  . CAD (coronary artery disease) of artery bypass graft 10/17/2012  . PAD (peripheral artery disease) ([place]) 10/17/2012  . Carotid stenosis 10/17/2012  . HTN (hypertension) 10/17/2012  . Hyperlipidemia 10/17/2012  . Tobacco abuse 10/17/2012    Outpatient Encounter Medications as of 02/28/2017  Medication Sig  . lisinopril (PRINIVIL,ZESTRIL) 5 MG tablet Take 2.5 mg by mouth daily.  . nitroGLYCERIN (NITROSTAT) 0.4 MG SL tablet Place 1  tablet (0.4 mg total) under the tongue every 5 (five) minutes as needed for chest pain.  . pantoprazole (PROTONIX) 40 MG tablet Take 1 tablet (40 mg total) by mouth 2 (two) times daily before a meal.  . rosuvastatin (CRESTOR) 40 MG tablet TAKE ONE (1) TABLET BY MOUTH EVERY DAY (Patient taking differently: TAKE ONE TABLET(11m) BY MOUTH EVERY DAY)  . Na Sulfate-K Sulfate-Mg Sulf 17.5-3.13-1.6 GM/177ML SOLN Take 1 kit by mouth as directed. (Patient not taking: Reported on 02/28/2017)   No facility-administered encounter medications on file as of 02/28/2017.     No Known Allergies  Review of Systems  Constitutional: Positive for fatigue. Negative for activity change, appetite change, diaphoresis and unexpected weight change.  HENT: Negative for congestion, dental problem, postnasal drip and rhinorrhea.   Eyes: Negative for redness and visual disturbance.  Respiratory: Negative for cough and shortness of breath.   Cardiovascular: Negative for chest pain, palpitations and leg swelling.  Gastrointestinal: Negative for abdominal pain, constipation, diarrhea, nausea and vomiting.       Is feeling better  Genitourinary: Negative for difficulty urinating, frequency and vaginal bleeding.  Musculoskeletal: Negative for arthralgias and back pain.  Neurological: Positive for light-headedness. Negative for dizziness and headaches.       Not as often  Psychiatric/Behavioral: Negative for dysphoric mood and sleep disturbance. The patient is not nervous/anxious.     BP (!) 110/54 (BP Location: Left Arm, Patient Position: Sitting, Cuff Size: Normal)  Pulse 88   Temp 97.9 F (36.6 C) (Temporal)   Resp 16   Ht '5\' 1"'  (1.549 m)   Wt 124 lb 4 oz (56.4 kg)   SpO2 98%   BMI 23.48 kg/m   Physical Exam  Constitutional: She is oriented to person, place, and time. She appears well-developed and well-nourished.  Appears tired  HENT:  Head: Normocephalic and atraumatic.  Mouth/Throat: Oropharynx is clear and  moist.  Eyes: Conjunctivae are normal. Pupils are equal, round, and reactive to light.  Cardiovascular: Normal rate, regular rhythm and normal heart sounds.  Pulmonary/Chest: Effort normal and breath sounds normal. No respiratory distress.  Abdominal: Soft. Bowel sounds are normal. There is no tenderness.  Midepigastric tenderness is improved, with no guarding or rebound.  No organomegaly  Musculoskeletal: Normal range of motion. She exhibits no edema.  Neurological: She is alert and oriented to person, place, and time.  Skin: Skin is warm and dry. There is pallor.  Psychiatric: She has a normal mood and affect. Her behavior is normal. Thought content normal.  Nursing note and vitals reviewed.   ASSESSMENT/PLAN:  1. Acute superficial gastritis with hemorrhage Symptomatically improved  2. Blood loss anemia Stable, for CBC.  Scheduled for tomorrow  3. Acute renal disease Improved.  Due for a new metabolic panel with GFR.  She states she is scheduled to have blood work Architectural technologist.  4. Essential hypertension Blood pressure low on last 3 visits with her blood loss.  Will follow   Patient Instructions  Continue to push fluids I will follow the colonoscopy Need to keep at least one visit with the nephrologist [facility] doctor) to fond out how to protect your kidneys  See me in April as scheduled   Raylene Everts, MD

## 2017-02-28 NOTE — Patient Instructions (Signed)
Continue to push fluids I will follow the colonoscopy Need to keep at least one visit with the nephrologist [facility] doctor) to fond out how to protect your kidneys  See me in April as scheduled

## 2017-03-01 ENCOUNTER — Encounter (HOSPITAL_COMMUNITY)
Admission: RE | Disposition: A | Discharge status: Home / Self Care | Payer: Self-pay | Source: Ambulatory Visit | Attending: Gastroenterology

## 2017-03-01 ENCOUNTER — Ambulatory Visit (HOSPITAL_COMMUNITY)
Admission: RE | Admit: 2017-03-01 | Discharge: 2017-03-01 | Disposition: A | Discharge status: Home / Self Care | Payer: Medicare Other | Source: Ambulatory Visit | Attending: Gastroenterology | Admitting: Gastroenterology

## 2017-03-01 ENCOUNTER — Encounter (HOSPITAL_COMMUNITY): Payer: Self-pay | Admitting: *Deleted

## 2017-03-01 ENCOUNTER — Other Ambulatory Visit: Payer: Self-pay

## 2017-03-01 DIAGNOSIS — Z87891 Personal history of nicotine dependence: Secondary | ICD-10-CM | POA: Diagnosis not present

## 2017-03-01 DIAGNOSIS — K2970 Gastritis, unspecified, without bleeding: Secondary | ICD-10-CM

## 2017-03-01 DIAGNOSIS — K644 Residual hemorrhoidal skin tags: Secondary | ICD-10-CM | POA: Diagnosis not present

## 2017-03-01 DIAGNOSIS — M1990 Unspecified osteoarthritis, unspecified site: Secondary | ICD-10-CM | POA: Insufficient documentation

## 2017-03-01 DIAGNOSIS — Z8249 Family history of ischemic heart disease and other diseases of the circulatory system: Secondary | ICD-10-CM | POA: Diagnosis not present

## 2017-03-01 DIAGNOSIS — I252 Old myocardial infarction: Secondary | ICD-10-CM | POA: Insufficient documentation

## 2017-03-01 DIAGNOSIS — Z955 Presence of coronary angioplasty implant and graft: Secondary | ICD-10-CM | POA: Diagnosis not present

## 2017-03-01 DIAGNOSIS — J439 Emphysema, unspecified: Secondary | ICD-10-CM | POA: Insufficient documentation

## 2017-03-01 DIAGNOSIS — D649 Anemia, unspecified: Secondary | ICD-10-CM

## 2017-03-01 DIAGNOSIS — Z7982 Long term (current) use of aspirin: Secondary | ICD-10-CM | POA: Diagnosis not present

## 2017-03-01 DIAGNOSIS — I708 Atherosclerosis of other arteries: Secondary | ICD-10-CM | POA: Diagnosis not present

## 2017-03-01 DIAGNOSIS — I2510 Atherosclerotic heart disease of native coronary artery without angina pectoris: Secondary | ICD-10-CM | POA: Insufficient documentation

## 2017-03-01 DIAGNOSIS — Q438 Other specified congenital malformations of intestine: Secondary | ICD-10-CM | POA: Insufficient documentation

## 2017-03-01 DIAGNOSIS — K621 Rectal polyp: Secondary | ICD-10-CM

## 2017-03-01 DIAGNOSIS — K219 Gastro-esophageal reflux disease without esophagitis: Secondary | ICD-10-CM | POA: Diagnosis not present

## 2017-03-01 DIAGNOSIS — I10 Essential (primary) hypertension: Secondary | ICD-10-CM | POA: Diagnosis not present

## 2017-03-01 DIAGNOSIS — Z7902 Long term (current) use of antithrombotics/antiplatelets: Secondary | ICD-10-CM | POA: Diagnosis not present

## 2017-03-01 DIAGNOSIS — Z79899 Other long term (current) drug therapy: Secondary | ICD-10-CM | POA: Insufficient documentation

## 2017-03-01 DIAGNOSIS — D128 Benign neoplasm of rectum: Secondary | ICD-10-CM | POA: Diagnosis not present

## 2017-03-01 HISTORY — PX: COLONOSCOPY: SHX5424

## 2017-03-01 SURGERY — COLONOSCOPY
Anesthesia: Moderate Sedation

## 2017-03-01 MED ORDER — MEPERIDINE HCL 100 MG/ML IJ SOLN
INTRAMUSCULAR | Status: AC
  Filled 2017-03-01: qty 2

## 2017-03-01 MED ORDER — SODIUM CHLORIDE 0.9% FLUSH
INTRAVENOUS | Status: AC
  Filled 2017-03-01: qty 10

## 2017-03-01 MED ORDER — SODIUM CHLORIDE 0.9 % IV SOLN
INTRAVENOUS | Status: DC
  Administered 2017-03-01: 12:00:00 via INTRAVENOUS

## 2017-03-01 MED ORDER — SPOT INK MARKER SYRINGE KIT
PACK | SUBMUCOSAL | Status: AC
  Filled 2017-03-01: qty 5

## 2017-03-01 MED ORDER — PROMETHAZINE HCL 25 MG/ML IJ SOLN
INTRAMUSCULAR | Status: AC
  Filled 2017-03-01: qty 1

## 2017-03-01 MED ORDER — MIDAZOLAM HCL 5 MG/5ML IJ SOLN
INTRAMUSCULAR | Status: AC
  Filled 2017-03-01: qty 10

## 2017-03-01 MED ORDER — ONDANSETRON HCL 4 MG/2ML IJ SOLN
INTRAMUSCULAR | Status: DC | PRN
  Administered 2017-03-01: 4 mg via INTRAVENOUS

## 2017-03-01 MED ORDER — MIDAZOLAM HCL 5 MG/5ML IJ SOLN
INTRAMUSCULAR | Status: DC | PRN
  Administered 2017-03-01 (×2): 1 mg via INTRAVENOUS

## 2017-03-01 MED ORDER — EPINEPHRINE PF 1 MG/10ML IJ SOSY
PREFILLED_SYRINGE | INTRAMUSCULAR | Status: AC
  Filled 2017-03-01: qty 10

## 2017-03-01 MED ORDER — SODIUM CHLORIDE 0.9 % IJ SOLN
PREFILLED_SYRINGE | INTRAMUSCULAR | Status: DC | PRN
  Administered 2017-03-01: 2 mL

## 2017-03-01 MED ORDER — SPOT INK MARKER SYRINGE KIT
PACK | SUBMUCOSAL | Status: DC | PRN
  Administered 2017-03-01: 2 mL via SUBMUCOSAL

## 2017-03-01 MED ORDER — ONDANSETRON HCL 4 MG/2ML IJ SOLN
INTRAMUSCULAR | Status: AC
  Filled 2017-03-01: qty 2

## 2017-03-01 MED ORDER — PROMETHAZINE HCL 25 MG/ML IJ SOLN
INTRAMUSCULAR | Status: DC | PRN
  Administered 2017-03-01: 12.5 mg via INTRAVENOUS

## 2017-03-01 MED ORDER — MEPERIDINE HCL 100 MG/ML IJ SOLN
INTRAMUSCULAR | Status: DC | PRN
  Administered 2017-03-01 (×2): 25 mg via INTRAVENOUS

## 2017-03-01 NOTE — Op Note (Signed)
[facility] Patient Name: Kaitlin [name] Procedure Date: 03/01/2017 11:20 AM MRN: [id number] Date of Birth: [birth year] Attending MD: Barney Drain MD, MD CSN: [id number] Age: 67 Admit Type: Outpatient Procedure:                Colonoscopy WITH ENDOSCOPIC MUCOSAL RESECTION Indications:              Anemia-PT OFF ASA & PLAVIX. JAN 2019: Hb 7.9, MCV                            93.6 Providers:                Barney Drain MD, MD, Lurline Del, RN, Nelma Rothman,                            Technician Referring MD:             Lysle Morales Medicines:                Promethazine 12.5 mg IV, Meperidine 50 mg IV,                            Midazolam 2 mg IV, Ondansetron 4 mg IV Complications:            No immediate complications. Estimated Blood Loss:     Estimated blood loss: none. Procedure:                Pre-Anesthesia Assessment:                           - Prior to the procedure, a History and Physical                            was performed, and patient medications and                            allergies were reviewed. The patient's tolerance of                            previous anesthesia was also reviewed. The risks                            and benefits of the procedure and the sedation                            options and risks were discussed with the patient.                            All questions were answered, and informed consent                            was obtained. Prior Anticoagulants: The patient has                            taken no previous anticoagulant or antiplatelet  agents. ASA Grade Assessment: II - A patient with                            mild systemic disease. After reviewing the risks                            and benefits, the patient was deemed in                            satisfactory condition to undergo the procedure.                            After obtaining informed consent, the colonoscope                            was  passed under direct vision. Throughout the                            procedure, the patient's blood pressure, pulse, and                            oxygen saturations were monitored continuously. The                            EC-[id number] ([id number]) scope was introduced through                            the anus and advanced to the 7 cm into the ileum.                            The colonoscopy was technically difficult and                            complex due to significant looping. Successful                            completion of the procedure was aided by                            straightening and shortening the scope to obtain                            bowel loop reduction and COLOWRAP. The patient                            tolerated the procedure fairly well. The quality of                            the bowel preparation was good. The terminal ileum,                            ileocecal valve, appendiceal orifice, and rectum  were photographed. Scope In: 11:59:21 AM Scope Out: 12:31:59 PM Scope Withdrawal Time: 0 hours 26 minutes 23 seconds  Total Procedure Duration: 0 hours 32 minutes 38 seconds  Findings:      The terminal ileum appeared normal.      The recto-sigmoid colon, sigmoid colon and descending colon were       significantly redundant.      A 20 to 21 mm polyp was found in the rectum. The polyp was sessile.       Preparations were made for mucosal resection. A 1:10,000 solution of       epinephrine was injected PRIOR TO REMOVA TO PREVENT BLEEDING. Snare       mucosal resection was performed. Resection and retrieval were complete.       Area NEAR POLYP BASE WAS tattooed with an injection of 2 mL of Spot       (carbon black). Area was successfully injected with 4 mL ELEVIEW for a       lift PRIOR TO polypectomy.      External hemorrhoids were found during retroflexion. The hemorrhoids       were moderate. Impression:               - The  examined portion of the ileum was normal.                           - Redundant colon.                           - LARGE RECTAL POLYP REMOVED PIECEMEAL                           - External hemorrhoids.                           - Mucosal resection was performed. Resection and                            retrieval were complete. Moderate Sedation:      Moderate (conscious) sedation was administered by the endoscopy nurse       and supervised by the endoscopist. The following parameters were       monitored: oxygen saturation, heart rate, blood pressure, and response       to care. Total physician intraservice time was 43 minutes. Recommendation:           - Repeat colonoscopy 1-3 YEARS for surveillance.                           - High fiber diet.                           - Continue present medications. MAY RE-START ASA                            DAILY. DISCUSSED WITH DR. BRANCH ANTICPATES PLAVIX                            WILL BE NEEDED IN FUTURE. WILL ODER GIVENS CAPSULE  NEXT WEEK TO COMPLETE EVALUATION FOR ANEMIA.                           - Await pathology results.                           - Patient has a contact number available for                            emergencies. The signs and symptoms of potential                            delayed complications were discussed with the                            patient. Return to normal activities tomorrow.                            Written discharge instructions were provided to the                            patient. Procedure Code(s):        --- Professional ---                           [phone], Colonoscopy, flexible; with endoscopic                            mucosal resection                           99152, Moderate sedation services provided by the                            same physician or other qualified health care                            professional performing the diagnostic or                             therapeutic service that the sedation supports,                            requiring the presence of an independent trained                            observer to assist in the monitoring of the                            patient's level of consciousness and physiological                            status; initial 15 minutes of intraservice time,  patient age 66 years or older                           [phone], Moderate sedation services; each additional                            15 minutes intraservice time                           [phone], Moderate sedation services; each additional                            15 minutes intraservice time Diagnosis Code(s):        --- Professional ---                           K64.4, Residual hemorrhoidal skin tags                           K62.1, Rectal polyp                           D64.9, Anemia, unspecified                           Q43.8, Other specified congenital malformations of                            intestine CPT copyright 2016 [employer]. All rights reserved. The codes documented in this report are preliminary and upon coder review may  be revised to meet current compliance requirements. Barney Drain, MD Barney Drain MD, MD 03/01/2017 12:49:28 PM This report has been signed electronically. Number of Addenda: 0

## 2017-03-01 NOTE — Discharge Instructions (Addendum)
You have small EXTERNAL hemorrhoids and diverticulosis IN YOUR LEFT COLON. YOU HAD ONE LARGE POLYP REMOVED FROM YOUR RECTUM. I TATTOOED NEAR THE BASE.    RE-START ASPIRIN TODAY. HOLD PLAVIX.  SCHEDULE GIVENS CAPSULE STUDY NEXT WEEK. AFTER THE GIVENS STUDY YOU WILL BE STARTED ON IRON PILLS.  RECHECK  YOUR BLOOD COUNT(CBC) IN 2 WEEKS.   DRINK WATER TO KEEP YOUR URINE LIGHT YELLOW.  FOLLOW A HIGH FIBER DIET. AVOID ITEMS THAT CAUSE BLOATING. See info below.  YOUR BIOPSY RESULTS WILL BE AVAILABLE IN MY CHART AFTER FEB 6 AND MY OFFICE WILL CONTACT YOU IN 10-14 DAYS WITH YOUR RESULTS.   USE PREPARATION H FOUR TIMES  A DAY IF NEEDED TO RELIEVE RECTAL PAIN/PRESSURE/BLEEDING.  FOLLOW UP IN 4 MOS.   Next colonoscopy in 1-3 years.  Colonoscopy Care After Read the instructions outlined below and refer to this sheet in the next week. These discharge instructions provide you with general information on caring for yourself after you leave the hospital. While your treatment has been planned according to the most current medical practices available, unavoidable complications occasionally occur. If you have any problems or questions after discharge, call DR. Drayden Lukas, [phone].  ACTIVITY  You may resume your regular activity, but move at a slower pace for the next 24 hours.   Take frequent rest periods for the next 24 hours.   Walking will help get rid of the air and reduce the bloated feeling in your belly (abdomen).   No driving for 24 hours (because of the medicine (anesthesia) used during the test).   You may shower.   Do not sign any important legal documents or operate any machinery for 24 hours (because of the anesthesia used during the test).    NUTRITION  Drink plenty of fluids.   You may resume your normal diet as instructed by your doctor.   Begin with a light meal and progress to your normal diet. Heavy or fried foods are harder to digest and may make you feel sick to your  stomach (nauseated).   Avoid alcoholic beverages for 24 hours or as instructed.    MEDICATIONS  You may resume your normal medications.   WHAT YOU CAN EXPECT TODAY  Some feelings of bloating in the abdomen.   Passage of more gas than usual.   Spotting of blood in your stool or on the toilet paper  .  IF YOU HAD POLYPS REMOVED DURING THE COLONOSCOPY:  Eat a soft diet IF YOU HAVE NAUSEA, BLOATING, ABDOMINAL PAIN, OR VOMITING.    FINDING OUT THE RESULTS OF YOUR TEST Not all test results are available during your visit. DR. Oneida Alar WILL CALL YOU WITHIN 14 DAYS OF YOUR PROCEDUE WITH YOUR RESULTS. Do not assume everything is normal if you have not heard from DR. Damiyah Ditmars, CALL HER OFFICE AT [phone].  SEEK IMMEDIATE MEDICAL ATTENTION AND CALL THE OFFICE: [phone] IF:  You have more than a spotting of blood in your stool.   Your belly is swollen (abdominal distention).   You are nauseated or vomiting.   You have a temperature over 101F.   You have abdominal pain or discomfort that is severe or gets worse throughout the day.  High-Fiber Diet A high-fiber diet changes your normal diet to include more whole grains, legumes, fruits, and vegetables. Changes in the diet involve replacing refined carbohydrates with unrefined foods. The calorie level of the diet is essentially unchanged. The Dietary Reference Intake (recommended amount) for adult males is  38 grams per day. For adult females, it is 25 grams per day. Pregnant and lactating women should consume 28 grams of fiber per day. Fiber is the intact part of a plant that is not broken down during digestion. Functional fiber is fiber that has been isolated from the plant to provide a beneficial effect in the body. PURPOSE  Increase stool bulk.   Ease and regulate bowel movements.   Lower cholesterol.   REDUCE RISK OF COLON CANCER  INDICATIONS THAT YOU NEED MORE FIBER  Constipation and hemorrhoids.   Uncomplicated  diverticulosis (intestine condition) and irritable bowel syndrome.   Weight management.   As a protective measure against hardening of the arteries (atherosclerosis), diabetes, and cancer.   GUIDELINES FOR INCREASING FIBER IN THE DIET  Start adding fiber to the diet slowly. A gradual increase of about 5 more grams (2 slices of whole-wheat bread, 2 servings of most fruits or vegetables, or 1 bowl of high-fiber cereal) per day is best. Too rapid an increase in fiber may result in constipation, flatulence, and bloating.   Drink enough water and fluids to keep your urine clear or pale yellow. Water, juice, or caffeine-free drinks are recommended. Not drinking enough fluid may cause constipation.   Eat a variety of high-fiber foods rather than one type of fiber.   Try to increase your intake of fiber through using high-fiber foods rather than fiber pills or supplements that contain small amounts of fiber.   The goal is to change the types of food eaten. Do not supplement your present diet with high-fiber foods, but replace foods in your present diet.   INCLUDE A VARIETY OF FIBER SOURCES  Replace refined and processed grains with whole grains, canned fruits with fresh fruits, and incorporate other fiber sources. White rice, white breads, and most bakery goods contain little or no fiber.   Brown whole-grain rice, buckwheat oats, and many fruits and vegetables are all good sources of fiber. These include: broccoli, Brussels sprouts, cabbage, cauliflower, beets, sweet potatoes, white potatoes (skin on), carrots, tomatoes, eggplant, squash, berries, fresh fruits, and dried fruits.   Cereals appear to be the richest source of fiber. Cereal fiber is found in whole grains and bran. Bran is the fiber-rich outer coat of cereal grain, which is largely removed in refining. In whole-grain cereals, the bran remains. In breakfast cereals, the largest amount of fiber is found in those with "bran" in their names.  The fiber content is sometimes indicated on the label.   You may need to include additional fruits and vegetables each day.   In baking, for 1 cup white flour, you may use the following substitutions:   1 cup whole-wheat flour minus 2 tablespoons.   1/2 cup white flour plus 1/2 cup whole-wheat flour.   Polyps, Colon  A polyp is extra tissue that grows inside your body. Colon polyps grow in the large intestine. The large intestine, also called the colon, is part of your digestive system. It is a long, hollow tube at the end of your digestive tract where your body makes and stores stool. Most polyps are not dangerous. They are benign. This means they are not cancerous. But over time, some types of polyps can turn into cancer. Polyps that are smaller than a pea are usually not harmful. But larger polyps could someday become or may already be cancerous. To be safe, doctors remove all polyps and test them.   PREVENTION There is not one sure way to prevent  polyps. You might be able to lower your risk of getting them if you:  Eat more fruits and vegetables and less fatty food.   Do not smoke.   Avoid alcohol.   Exercise every day.   Lose weight if you are overweight.   Eating more calcium and folate can also lower your risk of getting polyps. Some foods that are rich in calcium are milk, cheese, and broccoli. Some foods that are rich in folate are chickpeas, kidney beans, and spinach.    Diverticulosis Diverticulosis is a common condition that develops when small pouches (diverticula) form in the wall of the colon. The risk of diverticulosis increases with age. It happens more often in people who eat a low-fiber diet. Most individuals with diverticulosis have no symptoms. Those individuals with symptoms usually experience belly (abdominal) pain, constipation, or loose stools (diarrhea).  HOME CARE INSTRUCTIONS  Increase the amount of fiber in your diet as directed by your caregiver or  dietician. This may reduce symptoms of diverticulosis.   Drink at least 6 to 8 glasses of water each day to prevent constipation.   Try not to strain when you have a bowel movement.   Avoiding nuts and seeds to prevent complications is NOT NECESSARY.   FOODS HAVING HIGH FIBER CONTENT INCLUDE:  Fruits. Apple, peach, pear, tangerine, raisins, prunes.   Vegetables. Brussels sprouts, asparagus, broccoli, cabbage, carrot, cauliflower, romaine lettuce, spinach, summer squash, tomato, winter squash, zucchini.   Starchy Vegetables. Baked beans, kidney beans, lima beans, split peas, lentils, potatoes (with skin).   Grains. Whole wheat bread, brown rice, bran flake cereal, plain oatmeal, white rice, shredded wheat, bran muffins.   SEEK IMMEDIATE MEDICAL CARE IF:  You develop increasing pain or severe bloating.   You have an oral temperature above 101F.   You develop vomiting or bowel movements that are bloody or black.   Hemorrhoids Hemorrhoids are dilated (enlarged) veins around the rectum. Sometimes clots will form in the veins. This makes them swollen and painful. These are called thrombosed hemorrhoids. Causes of hemorrhoids include:  Constipation.   Straining to have a bowel movement.   HEAVY LIFTING   HOME CARE INSTRUCTIONS  Eat a well balanced diet and drink 6 to 8 glasses of water every day to avoid constipation. You may also use a bulk laxative.   Avoid straining to have bowel movements.   Keep anal area dry and clean.   Do not use a donut shaped pillow or sit on the toilet for long periods. This increases blood pooling and pain.   Move your bowels when your body has the urge; this will require less straining and will decrease pain and pressure.

## 2017-03-01 NOTE — H&P (Signed)
Primary Care Physician:  Raylene Everts, MD Primary Gastroenterologist:  Dr. Oneida Alar  Pre-Procedure History & Physical: HPI:  Kaitlin [name] is a 67 y.o. female here for  ANEMIA.  Past Medical History:  Diagnosis Date  . Arthritis   . CAD (coronary artery disease)    stents  . COPD (chronic obstructive pulmonary disease) ([place])   . Emphysema of lung ([place])   . GERD (gastroesophageal reflux disease)   . Hyperlipidemia   . Hypertension   . Iliac artery stenosis, right (HCC)    60%-70%  . Myocardial infarction ([place])   . Normocytic anemia 01/26/2017  . PAD (peripheral artery disease) ([place]) 12/14/2010   in the left vertebral, bilateral carotids    Past Surgical History:  Procedure Laterality Date  . CARDIAC CATHETERIZATION  02/04/2005   A cypher 5.3 x 18 mm at 16 atmosphere of pressure in the mid LAD, this stent covered the proximal part of the LAD and also the mid LAD, this was then post dilated with 3.25 x 15 mm Quantum at 16 atmosphereof pressure for 45 seconds  . ESOPHAGOGASTRODUODENOSCOPY N/A 01/28/2017   Procedure: ESOPHAGOGASTRODUODENOSCOPY (EGD);  Surgeon: Jerene Bears, MD;  Location: [facility] ENDOSCOPY;  Service: Gastroenterology;  Laterality: N/A;  . TUBAL LIGATION      Prior to Admission medications   Medication Sig Start Date End Date Taking? Authorizing Provider  lisinopril (PRINIVIL,ZESTRIL) 5 MG tablet Take 2.5 mg by mouth daily.   Yes [provider]  Na Sulfate-K Sulfate-Mg Sulf 17.5-3.13-1.6 GM/177ML SOLN Take 1 kit by mouth as directed. 02/12/17  Yes Bohdi Leeds L, MD  pantoprazole (PROTONIX) 40 MG tablet Take 1 tablet (40 mg total) by mouth 2 (two) times daily before a meal. 01/28/17  Yes Adhikari Bk, Amrit, MD  rosuvastatin (CRESTOR) 40 MG tablet TAKE ONE (1) TABLET BY MOUTH EVERY DAY Patient taking differently: TAKE ONE TABLET(15m) BY MOUTH EVERY DAY 12/28/16  Yes Branch, JAlphonse Guild MD  nitroGLYCERIN (NITROSTAT) 0.4 MG SL tablet Place 1 tablet (0.4  mg total) under the tongue every 5 (five) minutes as needed for chest pain. 10/09/13   BArnoldo Lenis MD    Allergies as of 02/12/2017  . (No Known Allergies)    Family History  Problem Relation Age of Onset  . Heart attack Mother   . Hyperlipidemia Mother   . Hypertension Mother   . Diabetes Mother   . Heart attack Father   . Early death Father 564 . Hyperlipidemia Father   . Hypertension Father   . Heart disease Sister   . Hyperlipidemia Sister   . Hypertension Sister   . Diabetes Brother   . Heart disease Sister   . Hyperlipidemia Sister   . Hypertension Sister   . Heart attack Sister   . Heart disease Sister   . Hyperlipidemia Sister   . Hypertension Sister   . Heart attack Brother   . Early death Brother 543 . Hyperlipidemia Brother   . Hypertension Brother   . Heart attack Brother   . Early death Brother 410 . Hyperlipidemia Brother   . Hypertension Brother   . Cancer Maternal Aunt   . Colon cancer Neg Hx     Social History   Socioeconomic History  . Marital status: Married    Spouse name: Kaitlin [name] . Number of children: 3  . Years of education: 199 . Highest education level: Not on file  Social Needs  . Financial resource strain: Not  on file  . Food insecurity - worry: Not on file  . Food insecurity - inability: Not on file  . Transportation needs - medical: Not on file  . Transportation needs - non-medical: Not on file  Occupational History  . Occupation: RETIRED    Comment: warehouse/textiles  Tobacco Use  . Smoking status: Former Smoker    Packs/day: 1.00    Years: 49.00    Pack years: 49.00    Types: Cigarettes    Start date: 02/09/1967    Last attempt to quit: 12/30/2016    Years since quitting: 0.1  . Smokeless tobacco: Never Used  Substance and Sexual Activity  . Alcohol use: No  . Drug use: No  . Sexual activity: Yes    Birth control/protection: Post-menopausal  Other Topics Concern  . Not on file  Social History Narrative    Retired   Has an [employer] in [employer] with husband Marcello [name] for 23 years   Stays busy with home, gardens, likes to can    Review of Systems: See HPI, otherwise negative ROS   Physical Exam: BP (!) 111/56   Pulse 94   Temp 97.8 F (36.6 C) (Oral)   Resp 15   Ht '5\' 1"'  (1.549 m)   Wt 124 lb (56.2 kg)   SpO2 100%   BMI 23.43 kg/m  General:   Alert,  pleasant and cooperative in NAD Head:  Normocephalic and atraumatic. Neck:  Supple; Lungs:  Clear throughout to auscultation.    Heart:  Regular rate and rhythm. Abdomen:  Soft, nontender and nondistended. Normal bowel sounds, without guarding, and without rebound.   Neurologic:  Alert and  oriented x4;  grossly normal neurologically.  Impression/Plan:     Anemia  PLAN:  1. TCS TODAY. DISCUSSED PROCEDURE, BENEFITS, & RISKS: < 1% chance of medication reaction, bleeding, perforation, or rupture of spleen/liver.

## 2017-03-04 ENCOUNTER — Ambulatory Visit: Payer: Medicare Other | Admitting: Family Medicine

## 2017-03-04 ENCOUNTER — Telehealth: Payer: Self-pay

## 2017-03-04 ENCOUNTER — Other Ambulatory Visit: Payer: Self-pay

## 2017-03-04 DIAGNOSIS — D649 Anemia, unspecified: Secondary | ICD-10-CM

## 2017-03-04 DIAGNOSIS — K621 Rectal polyp: Secondary | ICD-10-CM

## 2017-03-04 NOTE — Telephone Encounter (Signed)
Pt called office to schedule Givens Capsule Study. Givens scheduled per AVS from procedure 03/01/17. Givens scheduled for 03/12/17 at 7:30am, pt to arrive at 7:00am. Instructions mailed and given verbally. Orders entered.  4 month pp f/u ov scheduled with [facility]. She requested appt in March with EG be cancelled.

## 2017-03-05 ENCOUNTER — Encounter (HOSPITAL_COMMUNITY): Payer: Self-pay | Admitting: Gastroenterology

## 2017-03-06 NOTE — Progress Notes (Signed)
Pt is aware.  

## 2017-03-12 ENCOUNTER — Encounter (HOSPITAL_COMMUNITY)
Admission: RE | Disposition: A | Discharge status: Home / Self Care | Payer: Self-pay | Source: Ambulatory Visit | Attending: Gastroenterology

## 2017-03-12 ENCOUNTER — Ambulatory Visit (HOSPITAL_COMMUNITY)
Admission: RE | Admit: 2017-03-12 | Discharge: 2017-03-12 | Disposition: A | Discharge status: Home / Self Care | Payer: Medicare Other | Source: Ambulatory Visit | Attending: Gastroenterology | Admitting: Gastroenterology

## 2017-03-12 ENCOUNTER — Encounter (HOSPITAL_COMMUNITY): Payer: Self-pay

## 2017-03-12 DIAGNOSIS — D649 Anemia, unspecified: Secondary | ICD-10-CM | POA: Insufficient documentation

## 2017-03-12 DIAGNOSIS — K529 Noninfective gastroenteritis and colitis, unspecified: Secondary | ICD-10-CM | POA: Diagnosis not present

## 2017-03-12 HISTORY — PX: GIVENS CAPSULE STUDY: SHX5432

## 2017-03-12 SURGERY — IMAGING PROCEDURE, GI TRACT, INTRALUMINAL, VIA CAPSULE

## 2017-03-12 NOTE — OR Nursing (Signed)
Patient instructions given and patient verbalized understanding of correct procedure to follow. Patient ingested pill at 0715 and will have clear liquids after 2 hours and a small snack after 4 hours.  Patient will return at 1515. Capsule study blinking blue when patient left, no problems swallowing pill cam.

## 2017-03-13 ENCOUNTER — Ambulatory Visit: Payer: Medicare Other | Admitting: Gastroenterology

## 2017-03-15 ENCOUNTER — Encounter (HOSPITAL_COMMUNITY): Payer: Self-pay | Admitting: Gastroenterology

## 2017-03-22 ENCOUNTER — Telehealth: Payer: Self-pay | Admitting: Gastroenterology

## 2017-03-22 ENCOUNTER — Other Ambulatory Visit: Payer: Self-pay

## 2017-03-22 DIAGNOSIS — D649 Anemia, unspecified: Secondary | ICD-10-CM

## 2017-03-22 NOTE — Telephone Encounter (Addendum)
PLEASE CALL PT. HER GIVENS STUDY SHOWS MILD INFLAMMATION IN HER SMALL BOWEL. HER LOW BLOOD COUNT IS MOST LIKELY DUE TO HER KIDNEY DISEASE. SHE NEEDS A CBC WITHIN 7 DAYS. SHE SHOULD SEE HEMATOLOGY TO MANAGE HER CHRONIC ANEMIA.  NEXT OPV IN JUN 2019.

## 2017-03-22 NOTE — Telephone Encounter (Signed)
Referral sent 

## 2017-03-22 NOTE — Telephone Encounter (Signed)
I went over the previous lab results again with pt, and then she remembered me telling her. She has not heard from the Givens that was done on 03/12/2017. Dr. Oneida Alar, please advise!

## 2017-03-22 NOTE — Addendum Note (Signed)
Addended by: Inge Rise on: 03/22/2017 11:51 AM   Modules accepted: Orders

## 2017-03-22 NOTE — Telephone Encounter (Signed)
OK to refer to hematology.

## 2017-03-22 NOTE — Telephone Encounter (Signed)
Please call patient about her lab work, she is inquiring about the results from her CBC that was drawn 3 weeks ago

## 2017-03-22 NOTE — Telephone Encounter (Signed)
PT is aware and will go to [facility] next Friday for the blood work.

## 2017-03-25 NOTE — Telephone Encounter (Signed)
PATIENT SCHEDULED  °

## 2017-03-28 NOTE — Procedures (Signed)
  PATIENT DATA:GASTRIC PASSAGE TIME: 8 m, SB PASSAGE TIME: 6H 61m  RESULTS: LIMITED views of gastric mucosa due to retained contents.  No ULCERS, masses or AVMs seen. Occasional erosion and erythema in the distal jejun/ LIMITED VIEWS OF THE COLON DUE TO RETAINED CONTENTS. No old blood or fresh blood in the stomach, small bowel, or colon.  DIAGNOSIS: Mild small bowel INFLAMMATION-ANEMIA IS MOST LIKELY DUE TO HER KIDNEY DISEASE. -  Plan: 1. CBC WITHIN 7 DAYS.  2. SEE HEMATOLOGY TO MANAGE HER CHRONIC ANEMIA. 3. OPV IN JUN 2019.

## 2017-03-29 DIAGNOSIS — D649 Anemia, unspecified: Secondary | ICD-10-CM | POA: Diagnosis not present

## 2017-03-29 LAB — CBC WITH DIFFERENTIAL/PLATELET
BASOS ABS: 29 {cells}/uL (ref 0–200)
Basophils Relative: 0.8 %
EOS ABS: 180 {cells}/uL (ref 15–500)
EOS PCT: 5 %
HCT: 31.5 % — ABNORMAL LOW (ref 35.0–45.0)
Hemoglobin: 10.5 g/dL — ABNORMAL LOW (ref 11.7–15.5)
Lymphs Abs: 1408 cells/uL (ref 850–3900)
MCH: 31.6 pg (ref 27.0–33.0)
MCHC: 33.3 g/dL (ref 32.0–36.0)
MCV: 94.9 fL (ref 80.0–100.0)
MONOS PCT: 7.2 %
MPV: 10.5 fL (ref 7.5–12.5)
NEUTROS ABS: 1724 {cells}/uL (ref 1500–7800)
Neutrophils Relative %: 47.9 %
Platelets: 182 10*3/uL (ref 140–400)
RBC: 3.32 10*6/uL — ABNORMAL LOW (ref 3.80–5.10)
RDW: 13.6 % (ref 11.0–15.0)
TOTAL LYMPHOCYTE: 39.1 %
WBC mixed population: 259 cells/uL (ref 200–950)
WBC: 3.6 10*3/uL — ABNORMAL LOW (ref 3.8–10.8)

## 2017-04-01 NOTE — Progress Notes (Signed)
Pt is aware.  

## 2017-04-01 NOTE — Progress Notes (Signed)
LMOM for a return call.  

## 2017-04-08 ENCOUNTER — Encounter: Payer: Self-pay | Admitting: Family Medicine

## 2017-04-10 ENCOUNTER — Ambulatory Visit: Payer: Medicare Other | Admitting: Nurse Practitioner

## 2017-04-16 ENCOUNTER — Telehealth: Payer: Self-pay

## 2017-04-16 ENCOUNTER — Telehealth: Payer: Self-pay | Admitting: Cardiology

## 2017-04-16 ENCOUNTER — Telehealth: Payer: Self-pay | Admitting: Gastroenterology

## 2017-04-16 NOTE — Telephone Encounter (Signed)
PT LVM STATING SHE'S BEEN OFF HER PLAVIX SINCE JAN FOR SOME PROCEDURES SHE HAD DONE, WANTS TO KNOW WHEN SHE SHOULD START IT BACK.

## 2017-04-16 NOTE — Telephone Encounter (Signed)
Pt is aware of results and now is aware that I told her in Feb the results.

## 2017-04-16 NOTE — Telephone Encounter (Signed)
PLEASE CALL PT. She should discuss the benefits v. Risks of re-starting Plavix with Dr. Harl Bowie.

## 2017-04-16 NOTE — Telephone Encounter (Signed)
[place] PATIENT AFTER LUNCH , SAID SHE NEVER GOT THE RESULTS OF HER PILL STUDY

## 2017-04-16 NOTE — Telephone Encounter (Signed)
Returned pt call. She states that she has not taken her Plavix since 02/08/2017. She was having some trouble bleeding. She thinks that problem has been resolved. She would like to know when she is to start taking her asa and plavix again. Please advise.

## 2017-04-16 NOTE — Telephone Encounter (Signed)
PT is aware.

## 2017-04-16 NOTE — Telephone Encounter (Signed)
Pt called to ask when should she go back on the Plavix. She was taken off before her procedures. See cardiology phone note of 02/08/2017. She is taking the ASA 81 mg, but didn't know when to restart the Plavix.  Please advise!

## 2017-04-17 NOTE — Telephone Encounter (Signed)
Glad she is back on aspirin, ok to continue to stay off plavix for now. Id like to see her in about 6 weeks to reevaluate at that time   Carlyle Dolly MD

## 2017-04-17 NOTE — Telephone Encounter (Signed)
Pt notified to stay off plavix until next follow up in 6 weeks. She will continue asa.

## 2017-04-19 ENCOUNTER — Inpatient Hospital Stay (HOSPITAL_COMMUNITY): Payer: Medicare Other

## 2017-04-19 ENCOUNTER — Encounter (HOSPITAL_COMMUNITY): Payer: Self-pay | Admitting: Hematology

## 2017-04-19 ENCOUNTER — Encounter: Payer: Self-pay | Admitting: Gastroenterology

## 2017-04-19 ENCOUNTER — Other Ambulatory Visit: Payer: Self-pay

## 2017-04-19 ENCOUNTER — Inpatient Hospital Stay (HOSPITAL_COMMUNITY): Payer: Medicare Other | Attending: Hematology | Admitting: Hematology

## 2017-04-19 VITALS — BP 96/79 | HR 76 | Temp 98.6°F | Resp 20 | Wt 123.6 lb

## 2017-04-19 DIAGNOSIS — I10 Essential (primary) hypertension: Secondary | ICD-10-CM | POA: Diagnosis not present

## 2017-04-19 DIAGNOSIS — I2510 Atherosclerotic heart disease of native coronary artery without angina pectoris: Secondary | ICD-10-CM | POA: Insufficient documentation

## 2017-04-19 DIAGNOSIS — Z955 Presence of coronary angioplasty implant and graft: Secondary | ICD-10-CM | POA: Insufficient documentation

## 2017-04-19 DIAGNOSIS — Z7982 Long term (current) use of aspirin: Secondary | ICD-10-CM | POA: Insufficient documentation

## 2017-04-19 DIAGNOSIS — D649 Anemia, unspecified: Secondary | ICD-10-CM | POA: Insufficient documentation

## 2017-04-19 DIAGNOSIS — N189 Chronic kidney disease, unspecified: Secondary | ICD-10-CM | POA: Diagnosis not present

## 2017-04-19 LAB — COMPREHENSIVE METABOLIC PANEL
ALT: 13 U/L — ABNORMAL LOW (ref 14–54)
ANION GAP: 11 (ref 5–15)
AST: 19 U/L (ref 15–41)
Albumin: 4.3 g/dL (ref 3.5–5.0)
Alkaline Phosphatase: 113 U/L (ref 38–126)
BUN: 21 mg/dL — ABNORMAL HIGH (ref 6–20)
CHLORIDE: 106 mmol/L (ref 101–111)
CO2: 23 mmol/L (ref 22–32)
CREATININE: 1.65 mg/dL — AB (ref 0.44–1.00)
Calcium: 8.9 mg/dL (ref 8.9–10.3)
GFR, EST AFRICAN AMERICAN: 37 mL/min — AB (ref >60)
GFR, EST NON AFRICAN AMERICAN: 32 mL/min — AB (ref >60)
Glucose, Bld: 85 mg/dL (ref 65–99)
POTASSIUM: 3.7 mmol/L (ref 3.5–5.1)
SODIUM: 140 mmol/L (ref 135–145)
Total Bilirubin: 0.3 mg/dL (ref 0.3–1.2)
Total Protein: 7.6 g/dL (ref 6.5–8.1)

## 2017-04-19 LAB — CBC WITH DIFFERENTIAL/PLATELET
Basophils Absolute: 0.1 10*3/uL (ref 0.0–0.1)
Basophils Relative: 1 %
EOS ABS: 0.2 10*3/uL (ref 0.0–0.7)
EOS PCT: 4 %
HCT: 36.6 % (ref 36.0–46.0)
Hemoglobin: 11.5 g/dL — ABNORMAL LOW (ref 12.0–15.0)
LYMPHS ABS: 1.9 10*3/uL (ref 0.7–4.0)
LYMPHS PCT: 36 %
MCH: 30.7 pg (ref 26.0–34.0)
MCHC: 31.4 g/dL (ref 30.0–36.0)
MCV: 97.9 fL (ref 78.0–100.0)
MONO ABS: 0.3 10*3/uL (ref 0.1–1.0)
Monocytes Relative: 6 %
Neutro Abs: 2.9 10*3/uL (ref 1.7–7.7)
Neutrophils Relative %: 53 %
PLATELETS: 192 10*3/uL (ref 150–400)
RBC: 3.74 MIL/uL — AB (ref 3.87–5.11)
RDW: 13.6 % (ref 11.5–15.5)
WBC: 5.3 10*3/uL (ref 4.0–10.5)

## 2017-04-19 LAB — IRON AND TIBC
Iron: 133 ug/dL (ref 28–170)
Saturation Ratios: 38 % — ABNORMAL HIGH (ref 10.4–31.8)
TIBC: 354 ug/dL (ref 250–450)
UIBC: 221 ug/dL

## 2017-04-19 LAB — RETICULOCYTES
RBC.: 3.74 MIL/uL — AB (ref 3.87–5.11)
RETIC COUNT ABSOLUTE: 33.7 10*3/uL (ref 19.0–186.0)
Retic Ct Pct: 0.9 % (ref 0.4–3.1)

## 2017-04-19 LAB — LACTATE DEHYDROGENASE: LDH: 151 U/L (ref 98–192)

## 2017-04-19 LAB — VITAMIN B12: VITAMIN B 12: 197 pg/mL (ref 180–914)

## 2017-04-19 LAB — FOLATE: FOLATE: 16.5 ng/mL (ref >5.9)

## 2017-04-19 LAB — FERRITIN: Ferritin: 25 ng/mL (ref 11–307)

## 2017-04-19 NOTE — Assessment & Plan Note (Signed)
1.  Normocytic anemia: Differential diagnosis includes nutritional deficiency and anemia from chronic kidney disease.  Her stool test was negative for blood in December.  She had a EGD which showed chronic gastritis on 01/28/2017.  A colonoscopy on 03/01/2017 showed a tubular adenoma from the rectum, and external hemorrhoids.  No clear source of bleeding was seen.  Capsule study was also inconclusive.  She is taking 1 iron tablet daily since her hospitalization in December.  Her last blood transfusion was 2 units on 01/26/2018 when her hemoglobin dropped to 6.7.  Today we will check her CBC, CMP, SPEP, B40, folic acid, ferritin, iron panel.  We will also check her reticulocyte count, LDH and direct Coombs test to rule out hemolysis.  I have discussed with her and her daughter that she will likely need parenteral iron in the form of Feraheme to keep her percent saturation between 30 and 50 and ferritin in the range of 300-500.  She will also need Aranesp injections if her hemoglobin drops below 10.  We talked about the side effects of Feraheme and Aranesp in detail.  She uses permission to proceed with the treatment if needed.  2.  CAD with stents: She is currently taking aspirin daily.  Plavix is on hold since December.  As there was no sign of bleeding, she may restart Plavix.  3.  Hypertension: She is currently taking lisinopril 2.5 mg daily.  Metoprolol is on hold.

## 2017-04-19 NOTE — Patient Instructions (Signed)
[place] at [facility] Discharge Instructions  Today you saw Dr. Raliegh Ip.   Thank you for choosing [place] at [facility] to provide your oncology and hematology care.  To afford each patient quality time with our provider, please arrive at least 15 minutes before your scheduled appointment time.   If you have a lab appointment with the [facility] [facility] please come in thru the  Main Entrance and check in at the main information desk  You need to re-schedule your appointment should you arrive 10 or more minutes late.  We strive to give you quality time with our providers, and arriving late affects you and other patients whose appointments are after yours.  Also, if you no show three or more times for appointments you may be dismissed from the clinic at the providers discretion.     Again, thank you for choosing [facility].  Our hope is that these requests will decrease the amount of time that you wait before being seen by our physicians.       _____________________________________________________________  Should you have questions after your visit to [facility], please contact our office at [phone] between the hours of 8:30 a.m. and 4:30 p.m.  Voicemails left after 4:30 p.m. will not be returned until the following business day.  For prescription refill requests, have your pharmacy contact our office.       Resources For Cancer Patients and their Caregivers ? American Cancer Society: Can assist with transportation, wigs, general needs, runs Look Good Feel Better.        437-331-0791 ? Cancer Care: Provides financial assistance, online support groups, medication/co-pay assistance.  1-800-813-HOPE (647)532-9411) ? [place] Assists [place] cancer patients and their families through emotional , educational and financial support.  815-449-6558 ? [place] DSS Where to apply for food  stamps, Medicaid and utility assistance. 508 793 7555 ? [employer]: Transportation to medical appointments. 320-399-3326 ? Social Security Administration: May apply for disability if have a Stage IV cancer. (319)826-2826 (941)570-3986 ? [employer], Disability and Transit Services: Assists with nutrition, care and transit needs. [place] Support Programs:   > Cancer Support Group  2nd Tuesday of the month 1pm-2pm, Journey Room   > Creative Journey  3rd Tuesday of the month 1130am-1pm, [place] at [facility] Discharge Instructions Today you saw Dr. Raliegh Ip.    Thank you for choosing [place] at [facility] to provide your oncology and hematology care.  To afford each patient quality time with our provider, please arrive at least 15 minutes before your scheduled appointment time.   If you have a lab appointment with the [facility] please come in thru the  Main Entrance and check in at the main information desk  You need to re-schedule your appointment should you arrive 10 or more minutes late.  We strive to give you quality time with our providers, and arriving late affects you and other patients whose appointments are after yours.  Also, if you no show three or more times for appointments you may be dismissed from the clinic at the providers discretion.     Again, thank you for choosing [facility].  Our hope is that these requests will decrease the amount of time that you wait before being seen by our physicians.       _____________________________________________________________  Should you have questions after your visit to [facility], please contact our office at [phone] between the hours of 8:30 a.m. and 4:30 p.m.  Voicemails left after 4:30 p.m. will not be returned until the following business day.  For prescription refill requests, have your pharmacy contact our  office.       Resources For Cancer Patients and their Caregivers ? American Cancer Society: Can assist with transportation, wigs, general needs, runs Look Good Feel Better.        870-867-9333 ? Cancer Care: Provides financial assistance, online support groups, medication/co-pay assistance.  1-800-813-HOPE 918-248-4930) ? [place] Assists [place] cancer patients and their families through emotional , educational and financial support.  613-032-9784 ? [place] DSS Where to apply for food stamps, Medicaid and utility assistance. (915)427-6765 ? [employer]: Transportation to medical appointments. 581-069-5244 ? Social Security Administration: May apply for disability if have a Stage IV cancer. 512-317-2472 909-455-5968 ? [employer], Disability and Transit Services: Assists with nutrition, care and transit needs. [place] Support Programs:   > Cancer Support Group  2nd Tuesday of the month 1pm-2pm, Journey Room   > Creative Journey  3rd Tuesday of the month 1130am-1pm, Journey Room

## 2017-04-19 NOTE — Progress Notes (Signed)
CONSULT NOTE  Patient Care Team: Raylene Everts, MD as PCP - General (Family Medicine) Harl Bowie, Alphonse Guild, MD as Consulting Physician (Cardiology) Danie Binder, MD as Consulting Physician (Gastroenterology)  CHIEF COMPLAINTS/PURPOSE OF CONSULTATION:  Normocytic anemia.  HISTORY OF PRESENTING ILLNESS:  Kaitlin [name] 67 y.o. female is seen in consultation today for further workup of normocytic anemia.  She was hospitalized on 01/26/2017 with a hemoglobin of 6.7 when she received 2 units of blood transfusion.  Her Plavix was held.  Stool was negative for occult blood.  She denied any bleeding per rectum or melena.  She had a colonoscopy done on 03/01/2017 which showed a tubular adenoma in the rectum and external hemorrhoids.  An upper endoscopy on 01/28/2017 showed chronic gastritis.  Capsule study was also within normal limits.  She has been taking 1 tablet of iron daily without any major problems.  I have reviewed her hemoglobin over the several years.  She had normal hemoglobin until 2014.  Denies any chest pains, headaches or lightheadedness at this time.    MEDICAL HISTORY:  Past Medical History:  Diagnosis Date  . Arthritis   . CAD (coronary artery disease)    stents  . COPD (chronic obstructive pulmonary disease) ([place])   . Emphysema of lung ([place])   . GERD (gastroesophageal reflux disease)   . Hyperlipidemia   . Hypertension   . Iliac artery stenosis, right (HCC)    60%-70%  . Myocardial infarction ([place])   . Normocytic anemia 01/26/2017  . PAD (peripheral artery disease) ([place]) 12/14/2010   in the left vertebral, bilateral carotids    SURGICAL HISTORY: Past Surgical History:  Procedure Laterality Date  . CARDIAC CATHETERIZATION  02/04/2005   A cypher 5.3 x 18 mm at 16 atmosphere of pressure in the mid LAD, this stent covered the proximal part of the LAD and also the mid LAD, this was then post dilated with 3.25 x 15 mm Quantum at 16 atmosphereof pressure for 45  seconds  . COLONOSCOPY N/A 03/01/2017   Procedure: COLONOSCOPY;  Surgeon: Danie Binder, MD;  [facility];  Service: Endoscopy;  Laterality: N/A;  1:00pm  . ESOPHAGOGASTRODUODENOSCOPY N/A 01/28/2017   Procedure: ESOPHAGOGASTRODUODENOSCOPY (EGD);  Surgeon: Jerene Bears, MD;  Location: [facility] ENDOSCOPY;  Service: Gastroenterology;  Laterality: N/A;  . GIVENS CAPSULE STUDY N/A 03/12/2017   Procedure: GIVENS CAPSULE STUDY;  Surgeon: Danie Binder, MD;  Location: [facility];  Service: Endoscopy;  Laterality: N/A;  7:30am  . TUBAL LIGATION      SOCIAL HISTORY: Social History   Socioeconomic History  . Marital status: Married    Spouse name: Marcello Moores  . Number of children: 3  . Years of education: 59  . Highest education level: Not on file  Occupational History  . Occupation: RETIRED    Comment: warehouse/textiles  Social Needs  . Financial resource strain: Not hard at all  . Food insecurity:    Worry: Never true    Inability: Never true  . Transportation needs:    Medical: No    Non-medical: No  Tobacco Use  . Smoking status: Current Every Day Smoker    Packs/day: 1.00    Years: 49.00    Pack years: 49.00    Types: Cigarettes    Start date: 02/09/1967    Last attempt to quit: 12/30/2016    Years since quitting: 0.3  . Smokeless tobacco: Never Used  Substance and Sexual Activity  . Alcohol  use: No  . Drug use: No  . Sexual activity: Yes    Birth control/protection: Post-menopausal  Lifestyle  . Physical activity:    Days per week: 5 days    Minutes per session: 20 min  . Stress: Only a little  Relationships  . Social connections:    Talks on phone: More than three times a week    Gets together: More than three times a week    Attends religious service: Never    Active member of club or organization: No    Attends meetings of clubs or organizations: Never    Relationship status: Married  . Intimate partner violence:    Fear of current or ex partner: No     Emotionally abused: No    Physically abused: No    Forced sexual activity: No  Other Topics Concern  . Not on file  Social History Narrative   Retired   Has an [place] in [employer] with husband Marcello Moores for 23 years   Stays busy with home, gardens, likes to can    FAMILY HISTORY: Family History  Problem Relation Age of Onset  . Heart attack Mother   . Hyperlipidemia Mother   . Hypertension Mother   . Diabetes Mother   . Heart attack Father   . Early death Father 42  . Hyperlipidemia Father   . Hypertension Father   . Heart disease Sister   . Hyperlipidemia Sister   . Hypertension Sister   . Diabetes Brother   . Heart disease Sister   . Hyperlipidemia Sister   . Hypertension Sister   . Heart attack Sister   . Heart disease Sister   . Hyperlipidemia Sister   . Hypertension Sister   . Heart attack Brother   . Early death Brother 42  . Hyperlipidemia Brother   . Hypertension Brother   . Heart attack Brother   . Early death Brother 83  . Hyperlipidemia Brother   . Hypertension Brother   . Cancer Maternal Aunt   . Colon cancer Neg Hx   . Colon polyps Neg Hx     ALLERGIES:  has No Known Allergies.  MEDICATIONS:  Current Outpatient Medications  Medication Sig Dispense Refill  . aspirin 81 MG chewable tablet Chew 81 mg by mouth daily.    . ferrous sulfate 325 (65 FE) MG tablet Take 325 mg by mouth daily with breakfast.    . lisinopril (PRINIVIL,ZESTRIL) 5 MG tablet Take 2.5 mg by mouth daily.    . nitroGLYCERIN (NITROSTAT) 0.4 MG SL tablet Place 1 tablet (0.4 mg total) under the tongue every 5 (five) minutes as needed for chest pain. 25 tablet 3  . pantoprazole (PROTONIX) 40 MG tablet Take 1 tablet (40 mg total) by mouth 2 (two) times daily before a meal. 60 tablet 0  . rosuvastatin (CRESTOR) 40 MG tablet TAKE ONE (1) TABLET BY MOUTH EVERY DAY (Patient taking differently: TAKE ONE TABLET(58m) BY MOUTH EVERY DAY) 90 tablet 2   No current facility-administered medications  for this visit.     REVIEW OF SYSTEMS:   Constitutional: Denies fevers, chills or abnormal night sweats Eyes: Denies blurriness of vision, double vision or watery eyes Ears, nose, mouth, throat, and face: Denies mucositis or sore throat Respiratory: Denies cough, dyspnea or wheezes Cardiovascular: Denies palpitation, chest discomfort or lower extremity swelling Gastrointestinal:  Denies nausea, heartburn or change in bowel habits Skin: Denies abnormal skin rashes Lymphatics: Denies new lymphadenopathy or easy bruising  Neurological:Denies numbness, tingling or new weaknesses Behavioral/Psych: Mood is stable, no new changes  All other systems were reviewed with the patient and are negative.  PHYSICAL EXAMINATION: ECOG PERFORMANCE STATUS: 1 - Symptomatic but completely ambulatory  Vitals:   04/19/17 1414  BP: 96/79  Pulse: 76  Resp: 20  Temp: 98.6 F (37 C)  SpO2: 100%   Filed Weights   04/19/17 1414  Weight: 123 lb 9.6 oz (56.1 kg)    GENERAL:alert, no distress and comfortable SKIN: skin color, texture, turgor are normal, no rashes or significant lesions EYES: normal, conjunctiva are pink and non-injected, sclera clear OROPHARYNX:no exudate, no erythema and lips, buccal mucosa, and tongue normal  NECK: supple, thyroid normal size, non-tender, without nodularity LYMPH:  no palpable lymphadenopathy in the cervical, axillary or inguinal LUNGS: clear to auscultation and percussion with normal breathing effort HEART: regular rate & rhythm and no murmurs and no lower extremity edema ABDOMEN:abdomen soft, non-tender and normal bowel sounds Musculoskeletal:no cyanosis of digits and no clubbing  PSYCH: alert & oriented x 3 with fluent speech NEURO: no focal motor/sensory deficits  LABORATORY DATA:  I have reviewed the data as listed Recent Results (from the past 2160 hour(s))  Lipase, blood     Status: Abnormal   Collection Time: 01/26/17  2:00 PM  Result Value Ref Range    Lipase 90 (H) 11 - 51 U/L  Comprehensive metabolic panel     Status: Abnormal   Collection Time: 01/26/17  2:00 PM  Result Value Ref Range   Sodium 139 135 - 145 mmol/L   Potassium 4.5 3.5 - 5.1 mmol/L   Chloride 103 101 - 111 mmol/L   CO2 25 22 - 32 mmol/L   Glucose, Bld 120 (H) 65 - 99 mg/dL   BUN 66 (H) 6 - 20 mg/dL   Creatinine, Ser 3.53 (H) 0.44 - 1.00 mg/dL   Calcium 9.3 8.9 - 10.3 mg/dL   Total Protein 7.8 6.5 - 8.1 g/dL   Albumin 3.2 (L) 3.5 - 5.0 g/dL   AST 28 15 - 41 U/L   ALT 17 14 - 54 U/L   Alkaline Phosphatase 88 38 - 126 U/L   Total Bilirubin 0.8 0.3 - 1.2 mg/dL   GFR calc non Af Amer 13 (L) >60 mL/min   GFR calc Af Amer 15 (L) >60 mL/min    Comment: (NOTE) The eGFR has been calculated using the CKD EPI equation. This calculation has not been validated in all clinical situations. eGFR's persistently <60 mL/min signify possible Chronic Kidney Disease.    Anion gap 11 5 - 15  CBC     Status: Abnormal   Collection Time: 01/26/17  2:00 PM  Result Value Ref Range   WBC 8.8 4.0 - 10.5 K/uL   RBC 2.09 (L) 3.87 - 5.11 MIL/uL   Hemoglobin 6.7 (LL) 12.0 - 15.0 g/dL    Comment: REPEATED TO VERIFY CRITICAL RESULT CALLED TO, READ BACK BY AND VERIFIED WITH: J BLUE,RN 1438 01/26/17 D BRADLEY    HCT 20.5 (L) 36.0 - 46.0 %   MCV 98.1 78.0 - 100.0 fL   MCH 32.1 26.0 - 34.0 pg   MCHC 32.7 30.0 - 36.0 g/dL   RDW 14.7 11.5 - 15.5 %   Platelets 353 150 - 400 K/uL  I-Stat CG4 Lactic Acid, ED     Status: None   Collection Time: 01/26/17  2:21 PM  Result Value Ref Range   Lactic Acid, Venous 1.08 0.5 -  1.9 mmol/L  POC occult blood, ED Provider will collect     Status: None   Collection Time: 01/26/17  3:44 PM  Result Value Ref Range   Fecal Occult Bld NEGATIVE NEGATIVE  Type and screen [place]     Status: None   Collection Time: 01/26/17  4:24 PM  Result Value Ref Range   ABO/RH(D) O POS    Antibody Screen NEG    Sample Expiration 01/29/2017    Unit  Number [id number]    Blood Component Type RBC LR PHER1    Unit division 00    Status of Unit ISSUED,FINAL    Transfusion Status OK TO TRANSFUSE    Crossmatch Result Compatible    Unit Number [id number]    Blood Component Type RBC LR PHER2    Unit division 00    Status of Unit ISSUED,FINAL    Transfusion Status OK TO TRANSFUSE    Crossmatch Result Compatible   BPAM RBC     Status: None (Preliminary result)   Collection Time: 01/26/17  4:24 PM  Result Value Ref Range   ISSUE DATE / TIME 497026378588    Blood Product Unit Number [id number]    PRODUCT CODE E7209O70    Unit Type and Rh 5100    Blood Product Expiration Date 962836629476    ISSUE DATE / TIME 546503546568    Blood Product Unit Number [id number]    PRODUCT CODE W9675F16    Unit Type and Rh 5100    Blood Product Expiration Date 384665993570   BPAM RBC     Status: None (Preliminary result)   Collection Time: 01/26/17  4:24 PM  Result Value Ref Range   ISSUE DATE / TIME 177939030092    Blood Product Unit Number [id number]    PRODUCT CODE K5625W38    Unit Type and Rh 5100    Blood Product Expiration Date 937342876811    ISSUE DATE / TIME 572620355974    Blood Product Unit Number [id number]    PRODUCT CODE O1224M25    Unit Type and Rh 5100    Blood Product Expiration Date 003704888916   BPAM RBC     Status: None (Preliminary result)   Collection Time: 01/26/17  4:24 PM  Result Value Ref Range   ISSUE DATE / TIME 945038882800    Blood Product Unit Number [id number]    PRODUCT CODE X4801K55    Unit Type and Rh 5100    Blood Product Expiration Date 374827078675    ISSUE DATE / TIME 449201007121    Blood Product Unit Number [id number]    PRODUCT CODE E1583E94    Unit Type and Rh 5100    Blood Product Expiration Date 076808811031   BPAM RBC     Status: None (Preliminary result)   Collection Time: 01/26/17  4:24 PM  Result Value Ref Range   ISSUE DATE / TIME 594585929244    Blood Product  Unit Number [id number]    PRODUCT CODE B9038B33    Unit Type and Rh 5100    Blood Product Expiration Date 832919166060    ISSUE DATE / TIME 045997741423    Blood Product Unit Number [id number]    PRODUCT CODE S1683F29    Unit Type and Rh 5100    Blood Product Expiration Date 021115520802   BPAM RBC     Status: None   Collection Time: 01/26/17  4:24 PM  Result Value Ref Range   ISSUE DATE / TIME 233612244975    Blood  Product Unit Number [id number]    PRODUCT CODE F6812X51    Unit Type and Rh 5100    Blood Product Expiration Date 700174944967    ISSUE DATE / TIME 591638466599    Blood Product Unit Number [id number]    PRODUCT CODE S9233A07    Unit Type and Rh 5100    Blood Product Expiration Date 622633354562   Prepare RBC     Status: None   Collection Time: 01/26/17  4:25 PM  Result Value Ref Range   Order Confirmation ORDER PROCESSED BY BLOOD BANK   I-Stat CG4 Lactic Acid, ED     Status: None   Collection Time: 01/26/17  4:42 PM  Result Value Ref Range   Lactic Acid, Venous 0.58 0.5 - 1.9 mmol/L  Urinalysis, Routine w reflex microscopic     Status: Abnormal   Collection Time: 01/26/17  4:52 PM  Result Value Ref Range   Color, Urine YELLOW YELLOW   APPearance HAZY (A) CLEAR   Specific Gravity, Urine 1.016 1.005 - 1.030   pH 5.0 5.0 - 8.0   Glucose, UA NEGATIVE NEGATIVE mg/dL   Hgb urine dipstick MODERATE (A) NEGATIVE   Bilirubin Urine NEGATIVE NEGATIVE   Ketones, ur NEGATIVE NEGATIVE mg/dL   Protein, ur 100 (A) NEGATIVE mg/dL   Nitrite NEGATIVE NEGATIVE   Leukocytes, UA MODERATE (A) NEGATIVE   RBC / HPF 0-5 0 - 5 RBC/hpf   WBC, UA 6-30 0 - 5 WBC/hpf   Bacteria, UA RARE (A) NONE SEEN   Squamous Epithelial / LPF 0-5 (A) NONE SEEN   Mucus PRESENT   Vitamin B12     Status: None   Collection Time: 01/26/17  6:41 PM  Result Value Ref Range   Vitamin B-12 198 180 - 914 pg/mL    Comment: (NOTE) This assay is not validated for testing neonatal  or myeloproliferative syndrome specimens for Vitamin B12 levels.   Folate     Status: None   Collection Time: 01/26/17  6:41 PM  Result Value Ref Range   Folate 13.5 >5.9 ng/mL  Iron and TIBC     Status: None   Collection Time: 01/26/17  6:41 PM  Result Value Ref Range   Iron 92 28 - 170 ug/dL   TIBC 330 250 - 450 ug/dL   Saturation Ratios 28 10.4 - 31.8 %   UIBC 238 ug/dL  Ferritin     Status: None   Collection Time: 01/26/17  6:41 PM  Result Value Ref Range   Ferritin 256 11 - 307 ng/mL  Reticulocytes     Status: Abnormal   Collection Time: 01/26/17  6:41 PM  Result Value Ref Range   Retic Ct Pct 2.4 0.4 - 3.1 %   RBC. 1.97 (L) 3.87 - 5.11 MIL/uL   Retic Count, Absolute 47.3 19.0 - 186.0 K/uL  Lactate dehydrogenase     Status: Abnormal   Collection Time: 01/26/17  6:41 PM  Result Value Ref Range   LDH 245 (H) 98 - 192 U/L  Haptoglobin     Status: Abnormal   Collection Time: 01/26/17  6:41 PM  Result Value Ref Range   Haptoglobin 384 (H) 34 - 200 mg/dL    Comment: (NOTE) Performed At: [facility] [place], [place] [id number] Rush Farmer MD [id number]   HIV antibody (Routine Testing)     Status: None   Collection Time: 01/26/17  7:00 PM  Result Value Ref Range   HIV Screen 4th Generation  wRfx Non Reactive Non Reactive    Comment: (NOTE) Performed At: [facility] [place], [place] [id number] Rush Farmer MD [id number]   Hemoglobin A1c     Status: None   Collection Time: 01/26/17  7:00 PM  Result Value Ref Range   Hgb A1c MFr Bld 5.2 4.8 - 5.6 %    Comment: (NOTE) Pre diabetes:          5.7%-6.4% Diabetes:              >6.4% Glycemic control for   <7.0% adults with diabetes    Mean Plasma Glucose 102.54 mg/dL  TSH     Status: Abnormal   Collection Time: 01/26/17  7:08 PM  Result Value Ref Range   TSH 0.282 (L) 0.350 - 4.500 uIU/mL    Comment: Performed by a 3rd Generation assay with a functional  sensitivity of <=0.01 uIU/mL.  Comprehensive metabolic panel     Status: Abnormal   Collection Time: 01/27/17  4:01 AM  Result Value Ref Range   Sodium 144 135 - 145 mmol/L   Potassium 5.0 3.5 - 5.1 mmol/L   Chloride 112 (H) 101 - 111 mmol/L   CO2 24 22 - 32 mmol/L   Glucose, Bld 77 65 - 99 mg/dL   BUN 62 (H) 6 - 20 mg/dL   Creatinine, Ser 2.91 (H) 0.44 - 1.00 mg/dL   Calcium 9.0 8.9 - 10.3 mg/dL   Total Protein 6.7 6.5 - 8.1 g/dL   Albumin 2.8 (L) 3.5 - 5.0 g/dL   AST 23 15 - 41 U/L   ALT 15 14 - 54 U/L   Alkaline Phosphatase 78 38 - 126 U/L   Total Bilirubin 0.8 0.3 - 1.2 mg/dL   GFR calc non Af Amer 16 (L) >60 mL/min   GFR calc Af Amer 18 (L) >60 mL/min    Comment: (NOTE) The eGFR has been calculated using the CKD EPI equation. This calculation has not been validated in all clinical situations. eGFR's persistently <60 mL/min signify possible Chronic Kidney Disease.    Anion gap 8 5 - 15  CBC     Status: Abnormal   Collection Time: 01/27/17  4:01 AM  Result Value Ref Range   WBC 5.8 4.0 - 10.5 K/uL   RBC 2.76 (L) 3.87 - 5.11 MIL/uL   Hemoglobin 8.8 (L) 12.0 - 15.0 g/dL    Comment: REPEATED TO VERIFY POST TRANSFUSION SPECIMEN    HCT 26.3 (L) 36.0 - 46.0 %   MCV 95.3 78.0 - 100.0 fL   MCH 31.9 26.0 - 34.0 pg   MCHC 33.5 30.0 - 36.0 g/dL   RDW 15.8 (H) 11.5 - 15.5 %   Platelets 250 150 - 400 K/uL  Protime-INR     Status: None   Collection Time: 01/27/17  4:01 AM  Result Value Ref Range   Prothrombin Time 14.7 11.4 - 15.2 seconds   INR 1.16   APTT     Status: None   Collection Time: 01/27/17  4:01 AM  Result Value Ref Range   aPTT 26 24 - 36 seconds  Glucose, capillary     Status: None   Collection Time: 01/27/17  7:43 AM  Result Value Ref Range   Glucose-Capillary 74 65 - 99 mg/dL  Sodium, urine, random     Status: None   Collection Time: 01/27/17  2:44 PM  Result Value Ref Range   Sodium, Ur 71 mmol/L  Creatinine, urine, random  Status: None   Collection  Time: 01/27/17  2:44 PM  Result Value Ref Range   Creatinine, Urine 32.16 mg/dL  CBC with Differential/Platelet     Status: Abnormal   Collection Time: 01/28/17  4:23 AM  Result Value Ref Range   WBC 6.6 4.0 - 10.5 K/uL   RBC 2.82 (L) 3.87 - 5.11 MIL/uL   Hemoglobin 8.9 (L) 12.0 - 15.0 g/dL   HCT 26.7 (L) 36.0 - 46.0 %   MCV 94.7 78.0 - 100.0 fL   MCH 31.6 26.0 - 34.0 pg   MCHC 33.3 30.0 - 36.0 g/dL   RDW 16.0 (H) 11.5 - 15.5 %   Platelets 270 150 - 400 K/uL   Neutrophils Relative % 67 %   Neutro Abs 4.4 1.7 - 7.7 K/uL   Lymphocytes Relative 28 %   Lymphs Abs 1.9 0.7 - 4.0 K/uL   Monocytes Relative 4 %   Monocytes Absolute 0.3 0.1 - 1.0 K/uL   Eosinophils Relative 1 %   Eosinophils Absolute 0.1 0.0 - 0.7 K/uL   Basophils Relative 0 %   Basophils Absolute 0.0 0.0 - 0.1 K/uL  Comprehensive metabolic panel     Status: Abnormal   Collection Time: 01/28/17  4:23 AM  Result Value Ref Range   Sodium 143 135 - 145 mmol/L   Potassium 4.9 3.5 - 5.1 mmol/L   Chloride 114 (H) 101 - 111 mmol/L   CO2 22 22 - 32 mmol/L   Glucose, Bld 77 65 - 99 mg/dL   BUN 40 (H) 6 - 20 mg/dL   Creatinine, Ser 1.88 (H) 0.44 - 1.00 mg/dL   Calcium 8.8 (L) 8.9 - 10.3 mg/dL   Total Protein 6.5 6.5 - 8.1 g/dL   Albumin 2.8 (L) 3.5 - 5.0 g/dL   AST 27 15 - 41 U/L   ALT 16 14 - 54 U/L   Alkaline Phosphatase 80 38 - 126 U/L   Total Bilirubin 0.6 0.3 - 1.2 mg/dL   GFR calc non Af Amer 27 (L) >60 mL/min   GFR calc Af Amer 31 (L) >60 mL/min    Comment: (NOTE) The eGFR has been calculated using the CKD EPI equation. This calculation has not been validated in all clinical situations. eGFR's persistently <60 mL/min signify possible Chronic Kidney Disease.    Anion gap 7 5 - 15  Magnesium     Status: None   Collection Time: 01/28/17  4:23 AM  Result Value Ref Range   Magnesium 1.8 1.7 - 2.4 mg/dL  Phosphorus     Status: None   Collection Time: 01/28/17  4:23 AM  Result Value Ref Range   Phosphorus 4.0  2.5 - 4.6 mg/dL  T4, free     Status: None   Collection Time: 01/28/17  4:23 AM  Result Value Ref Range   Free T4 1.04 0.61 - 1.12 ng/dL    Comment: (NOTE) Biotin ingestion may interfere with free T4 tests. If the results are inconsistent with the TSH level, previous test results, or the clinical presentation, then consider biotin interference. If needed, order repeat testing after stopping biotin.   T3, free     Status: None   Collection Time: 01/28/17  4:23 AM  Result Value Ref Range   T3, Free 2.4 2.0 - 4.4 pg/mL    Comment: (NOTE) Performed At: [facility] [place], [place] [id number] Rush Farmer MD [id number]   Glucose, capillary     Status: None  Collection Time: 01/28/17  7:42 AM  Result Value Ref Range   Glucose-Capillary 74 65 - 99 mg/dL  Glucose, capillary     Status: None   Collection Time: 01/28/17  9:40 AM  Result Value Ref Range   Glucose-Capillary 84 65 - 99 mg/dL  Basic Metabolic Panel (BMET)     Status: Abnormal   Collection Time: 02/04/17  2:41 PM  Result Value Ref Range   Glucose, Bld 123 65 - 139 mg/dL    Comment: .        Non-fasting reference interval .    BUN 35 (H) 7 - 25 mg/dL   Creat 2.00 (H) 0.50 - 0.99 mg/dL    Comment: For patients >12 years of age, the reference limit for Creatinine is approximately 13% higher for people identified as African-American. .    BUN/Creatinine Ratio 18 6 - 22 (calc)   Sodium 139 135 - 146 mmol/L   Potassium 4.3 3.5 - 5.3 mmol/L   Chloride 106 98 - 110 mmol/L   CO2 23 20 - 32 mmol/L   Calcium 8.7 8.6 - 10.4 mg/dL  CBC with Differential     Status: Abnormal   Collection Time: 02/04/17  2:41 PM  Result Value Ref Range   WBC 6.9 3.8 - 10.8 Thousand/uL   RBC 2.44 (L) 3.80 - 5.10 Million/uL   Hemoglobin 7.7 (L) 11.7 - 15.5 g/dL   HCT 22.6 (L) 35.0 - 45.0 %   MCV 92.6 80.0 - 100.0 fL   MCH 31.6 27.0 - 33.0 pg   MCHC 34.1 32.0 - 36.0 g/dL   RDW 14.4 11.0 - 15.0 %   Platelets  216 140 - 400 Thousand/uL   MPV 10.6 7.5 - 12.5 fL   Neutro Abs 5,706 1,500 - 7,800 cells/uL   Lymphs Abs 918 850 - 3,900 cells/uL   WBC mixed population 207 200 - 950 cells/uL   Eosinophils Absolute 21 15 - 500 cells/uL   Basophils Absolute 48 0 - 200 cells/uL   Neutrophils Relative % 82.7 %   Total Lymphocyte 13.3 %   Monocytes Relative 3.0 %   Eosinophils Relative 0.3 %   Basophils Relative 0.7 %  Magnesium     Status: None   Collection Time: 02/04/17  2:41 PM  Result Value Ref Range   Magnesium 2.3 1.5 - 2.5 mg/dL  CBC with Differential/Platelet     Status: Abnormal   Collection Time: 02/12/17  9:22 AM  Result Value Ref Range   WBC 4.3 3.8 - 10.8 Thousand/uL   RBC 2.49 (L) 3.80 - 5.10 Million/uL   Hemoglobin 7.9 (L) 11.7 - 15.5 g/dL   HCT 23.3 (L) 35.0 - 45.0 %   MCV 93.6 80.0 - 100.0 fL   MCH 31.7 27.0 - 33.0 pg   MCHC 33.9 32.0 - 36.0 g/dL   RDW 15.8 (H) 11.0 - 15.0 %   Platelets 239 140 - 400 Thousand/uL   MPV 9.7 7.5 - 12.5 fL   Neutro Abs 2,249 1,500 - 7,800 cells/uL   Lymphs Abs 1,574 850 - 3,900 cells/uL   WBC mixed population 284 200 - 950 cells/uL   Eosinophils Absolute 142 15 - 500 cells/uL   Basophils Absolute 52 0 - 200 cells/uL   Neutrophils Relative % 52.3 %   Total Lymphocyte 36.6 %   Monocytes Relative 6.6 %   Eosinophils Relative 3.3 %   Basophils Relative 1.2 %  Basic metabolic panel     Status: Abnormal  Collection Time: 02/12/17  9:22 AM  Result Value Ref Range   Glucose, Bld 85 65 - 99 mg/dL    Comment: .            Fasting reference interval .    BUN 25 7 - 25 mg/dL   Creat 1.69 (H) 0.50 - 0.99 mg/dL    Comment: For patients >95 years of age, the reference limit for Creatinine is approximately 13% higher for people identified as African-American. .    BUN/Creatinine Ratio 15 6 - 22 (calc)   Sodium 142 135 - 146 mmol/L   Potassium 4.6 3.5 - 5.3 mmol/L   Chloride 109 98 - 110 mmol/L   CO2 25 20 - 32 mmol/L   Calcium 9.1 8.6 - 10.4  mg/dL  CBC with Differential     Status: Abnormal   Collection Time: 03/29/17  7:46 AM  Result Value Ref Range   WBC 3.6 (L) 3.8 - 10.8 Thousand/uL   RBC 3.32 (L) 3.80 - 5.10 Million/uL   Hemoglobin 10.5 (L) 11.7 - 15.5 g/dL   HCT 31.5 (L) 35.0 - 45.0 %   MCV 94.9 80.0 - 100.0 fL   MCH 31.6 27.0 - 33.0 pg   MCHC 33.3 32.0 - 36.0 g/dL   RDW 13.6 11.0 - 15.0 %   Platelets 182 140 - 400 Thousand/uL   MPV 10.5 7.5 - 12.5 fL   Neutro Abs 1,724 1,500 - 7,800 cells/uL   Lymphs Abs 1,408 850 - 3,900 cells/uL   WBC mixed population 259 200 - 950 cells/uL   Eosinophils Absolute 180 15 - 500 cells/uL   Basophils Absolute 29 0 - 200 cells/uL   Neutrophils Relative % 47.9 %   Total Lymphocyte 39.1 %   Monocytes Relative 7.2 %   Eosinophils Relative 5.0 %   Basophils Relative 0.8 %    RADIOGRAPHIC STUDIES: I have independently reviewed CT scan of the abdomen and pelvis dated 01/25/2017 which shows cholelithiasis and a 3.2 cm abdominal aortic aneurysm, which was infrarenal. ASSESSMENT & PLAN:  Normocytic anemia 1.  Normocytic anemia: Differential diagnosis includes nutritional deficiency and anemia from chronic kidney disease.  Her stool test was negative for blood in December.  She had a EGD which showed chronic gastritis on 01/28/2017.  A colonoscopy on 03/01/2017 showed a tubular adenoma from the rectum, and external hemorrhoids.  No clear source of bleeding was seen.  Capsule study was also inconclusive.  She is taking 1 iron tablet daily since her hospitalization in December.  Her last blood transfusion was 2 units on 01/26/2018 when her hemoglobin dropped to 6.7.  Today we will check her CBC, CMP, SPEP, W25, folic acid, ferritin, iron panel.  We will also check her reticulocyte count, LDH and direct Coombs test to rule out hemolysis.  I have discussed with her and her daughter that she will likely need parenteral iron in the form of Feraheme to keep her percent saturation between 30 and 50 and  ferritin in the range of 300-500.  She will also need Aranesp injections if her hemoglobin drops below 10.  We talked about the side effects of Feraheme and Aranesp in detail.  She uses permission to proceed with the treatment if needed.  2.  CAD with stents: She is currently taking aspirin daily.  Plavix is on hold since December.  As there was no sign of bleeding, she may restart Plavix.  3.  Hypertension: She is currently taking lisinopril 2.5 mg daily.  Metoprolol is on hold.  4.  CKD: We will make a referral to nephrology.      Derek Jack, MD 04/19/17 3:08 PM

## 2017-04-20 LAB — HAPTOGLOBIN: Haptoglobin: 214 mg/dL — ABNORMAL HIGH (ref 34–200)

## 2017-04-22 LAB — PROTEIN ELECTROPHORESIS, SERUM
A/G Ratio: 1.2 (ref 0.7–1.7)
ALBUMIN ELP: 3.7 g/dL (ref 2.9–4.4)
Alpha-1-Globulin: 0.3 g/dL (ref 0.0–0.4)
Alpha-2-Globulin: 1 g/dL (ref 0.4–1.0)
BETA GLOBULIN: 0.9 g/dL (ref 0.7–1.3)
GAMMA GLOBULIN: 0.9 g/dL (ref 0.4–1.8)
Globulin, Total: 3.1 g/dL (ref 2.2–3.9)
Total Protein ELP: 6.8 g/dL (ref 6.0–8.5)

## 2017-04-22 LAB — ERYTHROPOIETIN: ERYTHROPOIETIN: 42.6 m[IU]/mL — AB (ref 2.6–18.5)

## 2017-05-07 ENCOUNTER — Encounter (HOSPITAL_COMMUNITY): Payer: Self-pay | Admitting: Hematology

## 2017-05-07 ENCOUNTER — Inpatient Hospital Stay (HOSPITAL_COMMUNITY): Payer: Medicare Other | Attending: Hematology | Admitting: Hematology

## 2017-05-07 ENCOUNTER — Other Ambulatory Visit: Payer: Self-pay

## 2017-05-07 ENCOUNTER — Encounter (HOSPITAL_COMMUNITY): Payer: Self-pay | Admitting: Lab

## 2017-05-07 VITALS — BP 97/61 | HR 84 | Temp 97.7°F | Resp 16 | Wt 125.0 lb

## 2017-05-07 DIAGNOSIS — F17210 Nicotine dependence, cigarettes, uncomplicated: Secondary | ICD-10-CM | POA: Diagnosis not present

## 2017-05-07 DIAGNOSIS — J449 Chronic obstructive pulmonary disease, unspecified: Secondary | ICD-10-CM | POA: Diagnosis not present

## 2017-05-07 DIAGNOSIS — D631 Anemia in chronic kidney disease: Secondary | ICD-10-CM | POA: Insufficient documentation

## 2017-05-07 DIAGNOSIS — Z79899 Other long term (current) drug therapy: Secondary | ICD-10-CM | POA: Diagnosis not present

## 2017-05-07 DIAGNOSIS — N189 Chronic kidney disease, unspecified: Secondary | ICD-10-CM | POA: Diagnosis not present

## 2017-05-07 DIAGNOSIS — K644 Residual hemorrhoidal skin tags: Secondary | ICD-10-CM | POA: Insufficient documentation

## 2017-05-07 DIAGNOSIS — K219 Gastro-esophageal reflux disease without esophagitis: Secondary | ICD-10-CM | POA: Diagnosis not present

## 2017-05-07 DIAGNOSIS — Z955 Presence of coronary angioplasty implant and graft: Secondary | ICD-10-CM | POA: Insufficient documentation

## 2017-05-07 DIAGNOSIS — I2510 Atherosclerotic heart disease of native coronary artery without angina pectoris: Secondary | ICD-10-CM | POA: Diagnosis not present

## 2017-05-07 DIAGNOSIS — E538 Deficiency of other specified B group vitamins: Secondary | ICD-10-CM | POA: Diagnosis not present

## 2017-05-07 DIAGNOSIS — I252 Old myocardial infarction: Secondary | ICD-10-CM | POA: Diagnosis not present

## 2017-05-07 DIAGNOSIS — E785 Hyperlipidemia, unspecified: Secondary | ICD-10-CM | POA: Diagnosis not present

## 2017-05-07 DIAGNOSIS — K2950 Unspecified chronic gastritis without bleeding: Secondary | ICD-10-CM | POA: Insufficient documentation

## 2017-05-07 DIAGNOSIS — Z7902 Long term (current) use of antithrombotics/antiplatelets: Secondary | ICD-10-CM | POA: Diagnosis not present

## 2017-05-07 DIAGNOSIS — Z7982 Long term (current) use of aspirin: Secondary | ICD-10-CM | POA: Diagnosis not present

## 2017-05-07 DIAGNOSIS — I129 Hypertensive chronic kidney disease with stage 1 through stage 4 chronic kidney disease, or unspecified chronic kidney disease: Secondary | ICD-10-CM | POA: Insufficient documentation

## 2017-05-07 DIAGNOSIS — D649 Anemia, unspecified: Secondary | ICD-10-CM

## 2017-05-07 MED ORDER — CYANOCOBALAMIN 1000 MCG/ML IJ SOLN
INTRAMUSCULAR | Status: AC
  Filled 2017-05-07: qty 1

## 2017-05-07 MED ORDER — CYANOCOBALAMIN 1000 MCG/ML IJ SOLN
1000.0000 ug | Freq: Once | INTRAMUSCULAR | Status: AC
  Administered 2017-05-07: 1000 ug via INTRAMUSCULAR

## 2017-05-07 NOTE — Progress Notes (Signed)
Kaitlin [name], [place] [address]   CLINIC:  Medical Oncology/Hematology  PCP:  Kaitlin Everts, MD [phone] [address] [place]  [address] [phone]   REASON FOR VISIT:  Follow-up for normocytic anemia  CURRENT THERAPY: Iron tablet daily.   INTERVAL HISTORY:  Kaitlin [name] 67 y.o. female returns for normocytic anemia workup.  She denies any bleeding per rectum or melena.  She is tolerating one iron tablet daily very well.  She denies any constipation or diarrhea.  No gastric distress was reported.  She denies any fevers, night sweats or weight loss.  REVIEW OF SYSTEMS:  Review of Systems  Constitutional: Negative.   HENT:  Negative.   Respiratory: Negative.   Cardiovascular: Negative.   Gastrointestinal: Negative.   Genitourinary: Negative.    Musculoskeletal: Negative.   Neurological: Negative.   Psychiatric/Behavioral: Negative.      PAST MEDICAL/SURGICAL HISTORY:  Past Medical History:  Diagnosis Date  . Arthritis   . CAD (coronary artery disease)    stents  . COPD (chronic obstructive pulmonary disease) ([facility])   . Emphysema of lung ([facility])   . GERD (gastroesophageal reflux disease)   . Hyperlipidemia   . Hypertension   . Iliac artery stenosis, right (HCC)    60%-70%  . Myocardial infarction ([facility])   . Normocytic anemia 01/26/2017  . PAD (peripheral artery disease) ([facility]) 12/14/2010   in the left vertebral, bilateral carotids   Past Surgical History:  Procedure Laterality Date  . CARDIAC CATHETERIZATION  02/04/2005   A cypher 5.3 x 18 mm at 16 atmosphere of pressure in the mid LAD, this stent covered the proximal part of the LAD and also the mid LAD, this was then post dilated with 3.25 x 15 mm Quantum at 16 atmosphereof pressure for 45 seconds  . COLONOSCOPY N/A 03/01/2017   Procedure: COLONOSCOPY;  Surgeon: Danie Binder, MD;  [facility];  Service: Endoscopy;  Laterality: N/A;  1:00pm  .  ESOPHAGOGASTRODUODENOSCOPY N/A 01/28/2017   Procedure: ESOPHAGOGASTRODUODENOSCOPY (EGD);  Surgeon: Jerene Bears, MD;  Location[facility] END[facility]COPY;  Service: Gastroenterology;  Laterality: N/A;  . GIVENS CAPSULE STUDY N/A 03/12/2017   Procedure: GIVENS CAPSULE STUDY;  Surgeon: Danie Binder, MD;  Location: [facility];  Service: Endoscopy;  Laterality: N/A;  7:30am  . TUBAL LIGATION       SOCIAL HISTORY:  Social History   Socioeconomic History  . Marital status: Married    Spouse name: Kaitlin [name]  . Number of children: 3  . Years of education: 4  . Highest education level: Not on file  Occupational History  . Occupation: RETIRED    Comment: warehouse/textiles  Social Needs  . Financial resource strain: Not hard at all  . Food insecurity:    Worry: Never true    Inability: Never true  . Transportation needs:    Medical: No    Non-medical: No  Tobacco Use  . Smoking status: Current Every Day Smoker    Packs/day: 1.00    Years: 49.00    Pack years: 49.00    Types: Cigarettes    Start date: 02/09/1967    Last attempt to quit: 12/30/2016    Years since quitting: 0.3  . Smokeless tobacco: Never Used  Substance and Sexual Activity  . Alcohol use: No  . Drug use: No  . Sexual activity: Yes    Birth control/protection: Post-menopausal  Lifestyle  . Physical activity:  Days per week: 5 days    Minutes per session: 20 min  . Stress: Only a little  Relationships  . Social connections:    Talks on phone: More than three times a week    Gets together: More than three times a week    Attends religious service: Never    Active member of club or organization: No    Attends meetings of clubs or organizations: Never    Relationship status: Married  . Intimate partner violence:    Fear of current or ex partner: No    Emotionally abused: No    Physically abused: No    Forced sexual activity: No  Other Topics Concern  . Not on file  Social History Narrative   Retired   Has an [employer]  in [employer] with husband Kaitlin [name] for 23 years   Stays busy with home, gardens, likes to can    FAMILY HISTORY:  Family History  Problem Relation Age of Onset  . Heart attack Mother   . Hyperlipidemia Mother   . Hypertension Mother   . Diabetes Mother   . Heart attack Father   . Early death Father 42  . Hyperlipidemia Father   . Hypertension Father   . Heart disease Sister   . Hyperlipidemia Sister   . Hypertension Sister   . Diabetes Brother   . Heart disease Sister   . Hyperlipidemia Sister   . Hypertension Sister   . Heart attack Sister   . Heart disease Sister   . Hyperlipidemia Sister   . Hypertension Sister   . Heart attack Brother   . Early death Brother 41  . Hyperlipidemia Brother   . Hypertension Brother   . Heart attack Brother   . Early death Brother 27  . Hyperlipidemia Brother   . Hypertension Brother   . Cancer Maternal Aunt   . Colon cancer Neg Hx   . Colon polyps Neg Hx     CURRENT MEDICATIONS:  Outpatient Encounter Medications as of 05/07/2017  Medication Sig  . aspirin 81 MG chewable tablet Chew 81 mg by mouth daily.  . ferrous sulfate 325 (65 FE) MG tablet Take 325 mg by mouth daily with breakfast.  . lisinopril (PRINIVIL,ZESTRIL) 5 MG tablet Take 2.5 mg by mouth daily.  . nitroGLYCERIN (NITROSTAT) 0.4 MG SL tablet Place 1 tablet (0.4 mg total) under the tongue every 5 (five) minutes as needed for chest pain.  . pantoprazole (PROTONIX) 40 MG tablet Take 1 tablet (40 mg total) by mouth 2 (two) times daily before a meal.  . rosuvastatin (CRESTOR) 40 MG tablet TAKE ONE (1) TABLET BY MOUTH EVERY DAY (Patient taking differently: TAKE ONE TABLET(40mg ) BY MOUTH EVERY DAY)  . [EXPIRED] cyanocobalamin ((VITAMIN B-12)) injection 1,000 mcg    No facility-administered encounter medications on file as of 05/07/2017.     ALLERGIES:  No Known Allergies   PHYSICAL EXAM:  ECOG Performance status: 1  Vitals:   05/07/17 1448  BP: 97/61  Pulse: 84  Resp:  16  Temp: 97.7 F (36.5 C)  SpO2: 98%   Filed Weights   05/07/17 1448  Weight: 125 lb (56.7 kg)      LABORATORY DATA:  I have reviewed the labs as listed.  CBC    Component Value Date/Time   WBC 5.3 04/19/2017 1502   RBC 3.74 (L) 04/19/2017 1502   RBC 3.74 (L) 04/19/2017 1502   HGB 11.5 (L) 04/19/2017 1502   HCT  36.6 04/19/2017 1502   PLT 192 04/19/2017 1502   MCV 97.9 04/19/2017 1502   MCH 30.7 04/19/2017 1502   MCHC 31.4 04/19/2017 1502   RDW 13.6 04/19/2017 1502   LYMPHSABS 1.9 04/19/2017 1502   MONOABS 0.3 04/19/2017 1502   EOSABS 0.2 04/19/2017 1502   BASOSABS 0.1 04/19/2017 1502   CMP Latest Ref Rng & Units 04/19/2017 02/12/2017 02/04/2017  Glucose 65 - 99 mg/dL 85 85 123  BUN 6 - 20 mg/dL 21(H) 25 35(H)  Creatinine 0.44 - 1.00 mg/dL 1.65(H) 1.69(H) 2.00(H)  Sodium 135 - 145 mmol/L 140 142 139  Potassium 3.5 - 5.1 mmol/L 3.7 4.6 4.3  Chloride 101 - 111 mmol/L 106 109 106  CO2 22 - 32 mmol/L 23 25 23   Calcium 8.9 - 10.3 mg/dL 8.9 9.1 8.7  Total Protein 6.5 - 8.1 g/dL 7.6 - -  Total Bilirubin 0.3 - 1.2 mg/dL 0.3 - -  Alkaline Phos 38 - 126 U/L 113 - -  AST 15 - 41 U/L 19 - -  ALT 14 - 54 U/L 13(L) - -        ASSESSMENT & PLAN:   Normocytic anemia 1.  Normocytic anemia: Combination anemia from chronic kidney disease, will do iron deficiency and B12 deficiency.  -EGD on 01/28/2017 showed chronic gastritis, colonoscopy on 03/01/2017 showed tubular adenoma and external hemorrhoids with no clear source of bleeding. -Stool study was inconclusive -She is currently taking 1 iron tablet daily.  She is reluctant to consider iron infusion at this time.  I have told her to increase iron tablet to twice daily.  She will be seen back in 2 months and repeat labs.  If it does not show improvement then will consider Feraheme.  Most recent hemoglobin improved to 11.2. -She has borderline vitamin B12 levels.  She will be given B12 injection today.  She will start taking 1 mg of  B12 tablet daily.  2.  CAD with stents: She is currently taking aspirin daily.  Plavix is on hold since December.  As there was no sign of bleeding, she may restart Plavix.  3.  Hypertension: She is currently taking lisinopril 2.5 mg daily.  Metoprolol is on hold.      Orders placed this encounter:  Orders Placed This Encounter  Procedures  . CBC  . Iron and TIBC  . Ferritin  . Vitamin B12      Derek Jack, [place] [phone]

## 2017-05-07 NOTE — Assessment & Plan Note (Signed)
1.  Normocytic anemia: Combination anemia from chronic kidney disease, will do iron deficiency and B12 deficiency.  -EGD on 01/28/2017 showed chronic gastritis, colonoscopy on 03/01/2017 showed tubular adenoma and external hemorrhoids with no clear source of bleeding. -Stool study was inconclusive -She is currently taking 1 iron tablet daily.  She is reluctant to consider iron infusion at this time.  I have told her to increase iron tablet to twice daily.  She will be seen back in 2 months and repeat labs.  If it does not show improvement then will consider Feraheme.  Most recent hemoglobin improved to 11.2. -She has borderline vitamin B12 levels.  She will be given B12 injection today.  She will start taking 1 mg of B12 tablet daily.  2.  CAD with stents: She is currently taking aspirin daily.  Plavix is on hold since December.  As there was no sign of bleeding, she may restart Plavix.  3.  Hypertension: She is currently taking lisinopril 2.5 mg daily.  Metoprolol is on hold.

## 2017-05-07 NOTE — Progress Notes (Signed)
Kaitlin [name] presents today for injection per the provider's orders.  B12 administration without incident; see MAR for injection details.  Patient tolerated procedure well and without incident.  No questions or complaints noted at this time.  Discharged ambulatory in c/o family.

## 2017-05-07 NOTE — Progress Notes (Unsigned)
Referral sent to Dr Lowanda Foster.  Records faxed on 4-9

## 2017-05-23 ENCOUNTER — Other Ambulatory Visit: Payer: Self-pay | Admitting: Cardiology

## 2017-05-27 ENCOUNTER — Other Ambulatory Visit: Payer: Self-pay | Admitting: Urology

## 2017-05-27 DIAGNOSIS — R3129 Other microscopic hematuria: Secondary | ICD-10-CM

## 2017-05-28 ENCOUNTER — Ambulatory Visit: Payer: Medicare Other | Admitting: Family Medicine

## 2017-05-31 ENCOUNTER — Encounter: Payer: Self-pay | Admitting: Cardiology

## 2017-05-31 ENCOUNTER — Ambulatory Visit (INDEPENDENT_AMBULATORY_CARE_PROVIDER_SITE_OTHER): Payer: Medicare Other | Admitting: Cardiology

## 2017-05-31 VITALS — BP 110/62 | HR 91 | Ht 61.0 in | Wt 125.6 lb

## 2017-05-31 DIAGNOSIS — I2510 Atherosclerotic heart disease of native coronary artery without angina pectoris: Secondary | ICD-10-CM | POA: Diagnosis not present

## 2017-05-31 DIAGNOSIS — I6523 Occlusion and stenosis of bilateral carotid arteries: Secondary | ICD-10-CM

## 2017-05-31 DIAGNOSIS — I10 Essential (primary) hypertension: Secondary | ICD-10-CM

## 2017-05-31 DIAGNOSIS — E782 Mixed hyperlipidemia: Secondary | ICD-10-CM | POA: Diagnosis not present

## 2017-05-31 NOTE — Progress Notes (Signed)
Clinical Summary Kaitlin [name] is a 67 y.o.female seen today for follow up of the following medical problems.   1. CAD  - w/ DES to LAD and BMSx2 to RCA Jan 2007, reported normal LV function from notes.  - the RCA PCI was complicated by dissection which was also stented   - was temporaily off ASA and plavix due to severe anemia requiring transfusion. Extensive GI workup without clear source of bleeding - back on ASA and plavix now.  - no recent chest pain.    2. PAD  - 60-70% right external iliac artery stenosis by previous cath  - no recent claudication.    3. Carotid stenosis  - prior right carotid endarterectomy    - 04/1658 carotid US: LICA 63-01%,SWFU <93%. - no recent symptoms.   4. HTN  - compliant with meds.    5. Hyperlipidemia  - reports recent labswith pcp - compliant with statin  6. AAA 07/2014 small AAA 3 x 3.2 cm.  - no symptoms.    7. Anemia - previously severe as low as 6.7, required transfusion - followed by hematology. From notes thought to be related to CKD - GI workup with EGD showed chronic gastritis, colonscopy no clear bleeding source.   SH: just retired from [employer]. Spends most of time with great grandchildren (9 months, 2.5 years)    Past Medical History:  Diagnosis Date  . Arthritis   . CAD (coronary artery disease)    stents  . COPD (chronic obstructive pulmonary disease) ([facility])   . Emphysema of lung ([facility])   . GERD (gastroesophageal reflux disease)   . Hyperlipidemia   . Hypertension   . Iliac artery stenosis, right (HCC)    60%-70%  . Myocardial infarction ([place])   . Normocytic anemia 01/26/2017  . PAD (peripheral artery disease) ([facility]) 12/14/2010   in the left vertebral, bilateral carotids     No Known Allergies   Current Outpatient Medications  Medication Sig Dispense Refill  . aspirin 81 MG chewable tablet Chew 81 mg by mouth daily.    . clopidogrel (PLAVIX) 75 MG tablet Take 75 mg  by mouth daily.    . ferrous sulfate 325 (65 FE) MG tablet Take 325 mg by mouth daily with breakfast.    . lisinopril (PRINIVIL,ZESTRIL) 5 MG tablet Take 2.5 mg by mouth daily.    Marland Kitchen NITROSTAT 0.4 MG SL tablet PLACE 1 TABLET UNDER TONGUE EVERY 5 MINUTES FOR 3 DOSES AS NEEDED. 25 tablet 3  . pantoprazole (PROTONIX) 40 MG tablet Take 1 tablet (40 mg total) by mouth 2 (two) times daily before a meal. 60 tablet 0  . rosuvastatin (CRESTOR) 40 MG tablet TAKE ONE (1) TABLET BY MOUTH EVERY DAY (Patient taking differently: TAKE ONE TABLET(40mg ) BY MOUTH EVERY DAY) 90 tablet 2   No current facility-administered medications for this visit.      Past Surgical History:  Procedure Laterality Date  . CARDIAC CATHETERIZATION  02/04/2005   A cypher 5.3 x 18 mm at 16 atmosphere of pressure in the mid LAD, this stent covered the proximal part of the LAD and also the mid LAD, this was then post dilated with 3.25 x 15 mm Quantum at 16 atmosphereof pressure for 45 seconds  . COLONOSCOPY N/A 03/01/2017   Procedure: COLONOSCOPY;  Surgeon: Danie Binder, MD;  [facility];  Service: Endoscopy;  Laterality: N/A;  1:00pm  . ESOPHAGOGASTRODUODENOSCOPY N/A 01/28/2017   Procedure: ESOPHAGOGASTRODUODENOSCOPY (EGD);  Surgeon:  Pyrtle, Lajuan Lines, MD;  Location: [facility] ENDOSCOPY;  Service: Gastroenterology;  Laterality: N/A;  . GIVENS CAPSULE STUDY N/A 03/12/2017   Procedure: GIVENS CAPSULE STUDY;  Surgeon: Danie Binder, MD;  [facility];  Service: Endoscopy;  Laterality: N/A;  7:30am  . TUBAL LIGATION       No Known Allergies    Family History  Problem Relation Age of Onset  . Heart attack Mother   . Hyperlipidemia Mother   . Hypertension Mother   . Diabetes Mother   . Heart attack Father   . Early death Father 35  . Hyperlipidemia Father   . Hypertension Father   . Heart disease Sister   . Hyperlipidemia Sister   . Hypertension Sister   . Diabetes Brother   . Heart disease Sister   .  Hyperlipidemia Sister   . Hypertension Sister   . Heart attack Sister   . Heart disease Sister   . Hyperlipidemia Sister   . Hypertension Sister   . Heart attack Brother   . Early death Brother 68  . Hyperlipidemia Brother   . Hypertension Brother   . Heart attack Brother   . Early death Brother 73  . Hyperlipidemia Brother   . Hypertension Brother   . Cancer Maternal Aunt   . Colon cancer Neg Hx   . Colon polyps Neg Hx      Social History Ms. Damore reports that she has been smoking cigarettes.  She started smoking about 50 years ago. She has a 49.00 pack-year smoking history. She has never used smokeless tobacco. Ms. Langham reports that she does not drink alcohol.   Review of Systems CONSTITUTIONAL: No weight loss, fever, chills, weakness or fatigue.  HEENT: Eyes: No visual loss, blurred vision, double vision or yellow sclerae.No hearing loss, sneezing, congestion, runny nose or sore throat.  SKIN: No rash or itching.  CARDIOVASCULAR: per hpi RESPIRATORY: No shortness of breath, cough or sputum.  GASTROINTESTINAL: No anorexia, nausea, vomiting or diarrhea. No abdominal pain or blood.  GENITOURINARY: No burning on urination, no polyuria NEUROLOGICAL: No headache, dizziness, syncope, paralysis, ataxia, numbness or tingling in the extremities. No change in bowel or bladder control.  MUSCULOSKELETAL: No muscle, back pain, joint pain or stiffness.  LYMPHATICS: No enlarged nodes. No history of splenectomy.  PSYCHIATRIC: No history of depression or anxiety.  ENDOCRINOLOGIC: No reports of sweating, cold or heat intolerance. No polyuria or polydipsia.  Marland Kitchen   Physical Examination Vitals:   05/31/17 1005  BP: 110/62  Pulse: 91  SpO2: 97%   Vitals:   05/31/17 1005  Weight: 125 lb 9.6 oz (57 kg)  Height: 5\' 1"  (1.549 m)    Gen: resting comfortably, no acute distress HEENT: no scleral icterus, pupils equal round and reactive, no palptable cervical adenopathy,  CV: RRR, no  m/r/g, no jvd Resp: Clear to auscultation bilaterally GI: abdomen is soft, non-tender, non-distended, normal bowel sounds, no hepatosplenomegaly MSK: extremities are warm, no edema.  Skin: warm, no rash Neuro:  no focal deficits Psych: appropriate affect   Diagnostic Studies 07/2014 Carotid US - 40-59% bilateral diseae, right CEA is patent.   07/2014 AAA [place] 3 x 3.2 aneurysm  05/2016 Carotid US IMPRESSION: Less than 50% stenosis in the right internal carotid artery.  50-69% stenosis in the left internal carotid artery.     Assessment and Plan  1. CAD - back on DAPT after recent issues with anemia. Given her history of complex RCA PCI as well  as carotid and PAD would favor continuing DAPT, if recurrence of anemia though would d/c plavix long term.   2. HTN - at goal, continue current meds  3. Hyperlipidemia -  continue staitn, request pcp labs.   4. Carotid stenosis -continue medical therapy.       Arnoldo Lenis, M.D.

## 2017-05-31 NOTE — Patient Instructions (Signed)
Your physician recommends that you schedule a follow-up appointment in: 4 months with Kaitlin [name]     Your physician recommends that you continue on your current medications as directed. Please refer to the Current Medication list given to you today.    If you need a refill on your cardiac medications before your next appointment, please call your pharmacy.      No lab work or tests ordered today.      Thank you for choosing [facility] !

## 2017-06-11 ENCOUNTER — Other Ambulatory Visit: Payer: Self-pay | Admitting: Gastroenterology

## 2017-06-13 ENCOUNTER — Ambulatory Visit (HOSPITAL_COMMUNITY)
Admission: RE | Admit: 2017-06-13 | Discharge: 2017-06-13 | Disposition: A | Discharge status: Home / Self Care | Payer: Medicare Other | Source: Ambulatory Visit | Attending: Urology | Admitting: Urology

## 2017-06-13 DIAGNOSIS — R3129 Other microscopic hematuria: Secondary | ICD-10-CM | POA: Diagnosis not present

## 2017-06-13 DIAGNOSIS — N281 Cyst of kidney, acquired: Secondary | ICD-10-CM | POA: Insufficient documentation

## 2017-06-21 DIAGNOSIS — D519 Vitamin B12 deficiency anemia, unspecified: Secondary | ICD-10-CM | POA: Diagnosis not present

## 2017-06-21 DIAGNOSIS — Z1159 Encounter for screening for other viral diseases: Secondary | ICD-10-CM | POA: Diagnosis not present

## 2017-06-21 DIAGNOSIS — N183 Chronic kidney disease, stage 3 (moderate): Secondary | ICD-10-CM | POA: Diagnosis not present

## 2017-06-21 DIAGNOSIS — R809 Proteinuria, unspecified: Secondary | ICD-10-CM | POA: Diagnosis not present

## 2017-06-21 DIAGNOSIS — I10 Essential (primary) hypertension: Secondary | ICD-10-CM | POA: Diagnosis not present

## 2017-06-21 DIAGNOSIS — Z79899 Other long term (current) drug therapy: Secondary | ICD-10-CM | POA: Diagnosis not present

## 2017-06-21 DIAGNOSIS — E559 Vitamin D deficiency, unspecified: Secondary | ICD-10-CM | POA: Diagnosis not present

## 2017-06-21 DIAGNOSIS — D509 Iron deficiency anemia, unspecified: Secondary | ICD-10-CM | POA: Diagnosis not present

## 2017-06-26 ENCOUNTER — Ambulatory Visit (INDEPENDENT_AMBULATORY_CARE_PROVIDER_SITE_OTHER): Payer: Medicare Other | Admitting: Urology

## 2017-06-26 DIAGNOSIS — R3129 Other microscopic hematuria: Secondary | ICD-10-CM

## 2017-07-03 ENCOUNTER — Ambulatory Visit: Payer: Medicare Other | Admitting: Gastroenterology

## 2017-07-03 DIAGNOSIS — E785 Hyperlipidemia, unspecified: Secondary | ICD-10-CM | POA: Diagnosis not present

## 2017-07-03 DIAGNOSIS — E559 Vitamin D deficiency, unspecified: Secondary | ICD-10-CM | POA: Diagnosis not present

## 2017-07-03 DIAGNOSIS — D649 Anemia, unspecified: Secondary | ICD-10-CM | POA: Diagnosis not present

## 2017-07-03 DIAGNOSIS — I998 Other disorder of circulatory system: Secondary | ICD-10-CM | POA: Diagnosis not present

## 2017-07-03 DIAGNOSIS — I10 Essential (primary) hypertension: Secondary | ICD-10-CM | POA: Diagnosis not present

## 2017-07-03 DIAGNOSIS — I2510 Atherosclerotic heart disease of native coronary artery without angina pectoris: Secondary | ICD-10-CM | POA: Diagnosis not present

## 2017-07-03 DIAGNOSIS — R5383 Other fatigue: Secondary | ICD-10-CM | POA: Diagnosis not present

## 2017-07-08 ENCOUNTER — Inpatient Hospital Stay (HOSPITAL_COMMUNITY): Payer: Medicare Other | Attending: Hematology

## 2017-07-08 DIAGNOSIS — Z7982 Long term (current) use of aspirin: Secondary | ICD-10-CM | POA: Insufficient documentation

## 2017-07-08 DIAGNOSIS — D509 Iron deficiency anemia, unspecified: Secondary | ICD-10-CM | POA: Insufficient documentation

## 2017-07-08 DIAGNOSIS — Z79899 Other long term (current) drug therapy: Secondary | ICD-10-CM | POA: Diagnosis not present

## 2017-07-08 DIAGNOSIS — I10 Essential (primary) hypertension: Secondary | ICD-10-CM | POA: Insufficient documentation

## 2017-07-08 DIAGNOSIS — E538 Deficiency of other specified B group vitamins: Secondary | ICD-10-CM | POA: Diagnosis not present

## 2017-07-08 DIAGNOSIS — D631 Anemia in chronic kidney disease: Secondary | ICD-10-CM | POA: Insufficient documentation

## 2017-07-08 DIAGNOSIS — I2510 Atherosclerotic heart disease of native coronary artery without angina pectoris: Secondary | ICD-10-CM | POA: Insufficient documentation

## 2017-07-08 DIAGNOSIS — N189 Chronic kidney disease, unspecified: Secondary | ICD-10-CM | POA: Insufficient documentation

## 2017-07-08 DIAGNOSIS — D649 Anemia, unspecified: Secondary | ICD-10-CM

## 2017-07-08 LAB — VITAMIN B12: Vitamin B-12: 521 pg/mL (ref 180–914)

## 2017-07-08 LAB — CBC
HCT: 39.1 % (ref 36.0–46.0)
HEMOGLOBIN: 12.3 g/dL (ref 12.0–15.0)
MCH: 30.8 pg (ref 26.0–34.0)
MCHC: 31.5 g/dL (ref 30.0–36.0)
MCV: 98 fL (ref 78.0–100.0)
PLATELETS: 181 10*3/uL (ref 150–400)
RBC: 3.99 MIL/uL (ref 3.87–5.11)
RDW: 14.3 % (ref 11.5–15.5)
WBC: 4.4 10*3/uL (ref 4.0–10.5)

## 2017-07-08 LAB — IRON AND TIBC
IRON: 95 ug/dL (ref 28–170)
SATURATION RATIOS: 28 % (ref 10.4–31.8)
TIBC: 336 ug/dL (ref 250–450)
UIBC: 241 ug/dL

## 2017-07-08 LAB — FERRITIN: Ferritin: 59 ng/mL (ref 11–307)

## 2017-07-09 ENCOUNTER — Other Ambulatory Visit: Payer: Self-pay

## 2017-07-09 ENCOUNTER — Encounter (HOSPITAL_COMMUNITY): Payer: Self-pay | Admitting: Hematology

## 2017-07-09 ENCOUNTER — Inpatient Hospital Stay (HOSPITAL_BASED_OUTPATIENT_CLINIC_OR_DEPARTMENT_OTHER): Payer: Medicare Other | Admitting: Hematology

## 2017-07-09 ENCOUNTER — Other Ambulatory Visit (HOSPITAL_COMMUNITY): Payer: Medicare Other

## 2017-07-09 VITALS — BP 100/78 | HR 89 | Temp 98.2°F | Resp 16 | Wt 131.2 lb

## 2017-07-09 DIAGNOSIS — Z7982 Long term (current) use of aspirin: Secondary | ICD-10-CM | POA: Diagnosis not present

## 2017-07-09 DIAGNOSIS — D631 Anemia in chronic kidney disease: Secondary | ICD-10-CM | POA: Diagnosis not present

## 2017-07-09 DIAGNOSIS — N189 Chronic kidney disease, unspecified: Secondary | ICD-10-CM | POA: Diagnosis not present

## 2017-07-09 DIAGNOSIS — Z955 Presence of coronary angioplasty implant and graft: Secondary | ICD-10-CM | POA: Diagnosis not present

## 2017-07-09 DIAGNOSIS — I2510 Atherosclerotic heart disease of native coronary artery without angina pectoris: Secondary | ICD-10-CM

## 2017-07-09 DIAGNOSIS — I129 Hypertensive chronic kidney disease with stage 1 through stage 4 chronic kidney disease, or unspecified chronic kidney disease: Secondary | ICD-10-CM | POA: Diagnosis not present

## 2017-07-09 DIAGNOSIS — D649 Anemia, unspecified: Secondary | ICD-10-CM

## 2017-07-09 DIAGNOSIS — E538 Deficiency of other specified B group vitamins: Secondary | ICD-10-CM | POA: Diagnosis not present

## 2017-07-09 DIAGNOSIS — D509 Iron deficiency anemia, unspecified: Secondary | ICD-10-CM | POA: Diagnosis not present

## 2017-07-09 NOTE — Assessment & Plan Note (Signed)
1.  Normocytic anemia: Combination anemia from chronic kidney disease, will do iron deficiency and B12 deficiency.  -EGD on 01/28/2017 showed chronic gastritis, colonoscopy on 03/01/2017 showed tubular adenoma and external hemorrhoids with no clear source of bleeding. -Stool study was inconclusive -At her last visit 2 months ago, I have increased her iron to twice daily.  She is tolerating it very well.  Her ferritin has improved from 25 in March to 59 at this visit.  Percent saturation has slightly gone down from 38-28.  Hemoglobinhas improved to 12.3.  Hence I have recommended continuing iron twice daily.  She has also started vitamin B12 1 mg daily at last visit.  B12 levels are normal today.  She will also continue B12 daily.  We will see her back in 4 months with repeat blood work few days ahead.  2.  CAD with stents: She is currently taking aspirin daily.  She has started back on Plavix.  No bleeding or dark stools was noted.  3.  Hypertension: She is currently taking lisinopril 2.5 mg daily.  Metoprolol is on hold.  She does report occasional dizziness when standing up.  She is on the lowest dose of lisinopril.  I have told her to contact her cardiologist.

## 2017-07-09 NOTE — Progress Notes (Signed)
Kaitlin [name], [place] [address]   CLINIC:  Medical Oncology/Hematology  PCP:  Kaitlin Albee, MD [place] [address] [phone]   REASON FOR VISIT:  Follow-up for normocytic anemia, B12 and iron deficiency  CURRENT THERAPY: Oral iron therapy and B12   INTERVAL HISTORY:  Kaitlin [name] 67 y.o. female returns for follow-up of iron deficiency anemia in the setting of chronic kidney disease.  She also has B12 deficiency.  She is tolerating iron tablet twice daily very well.  She also takes B12 tablet daily.  She has started back on Plavix and denies any bleeding per rectum or melena.  She continues to have some numbness in the feet which she is stable.  Reports dizziness when standing up.  I have reviewed her medications.  She is on lisinopril 2.5 mg daily.  REVIEW OF SYSTEMS:  Review of Systems  Neurological: Positive for dizziness and numbness.  Hematological: Bruises/bleeds easily.  All other systems reviewed and are negative.    PAST MEDICAL/SURGICAL HISTORY:  Past Medical History:  Diagnosis Date  . Arthritis   . CAD (coronary artery disease)    stents  . COPD (chronic obstructive pulmonary disease) ([place])   . Emphysema of lung ([place])   . GERD (gastroesophageal reflux disease)   . Hyperlipidemia   . Hypertension   . Iliac artery stenosis, right (HCC)    60%-70%  . Myocardial infarction ([place])   . Normocytic anemia 01/26/2017  . PAD (peripheral artery disease) ([place]) 12/14/2010   in the left vertebral, bilateral carotids   Past Surgical History:  Procedure Laterality Date  . CARDIAC CATHETERIZATION  02/04/2005   A cypher 5.3 x 18 mm at 16 atmosphere of pressure in the mid LAD, this stent covered the proximal part of the LAD and also the mid LAD, this was then post dilated with 3.25 x 15 mm Quantum at 16 atmosphereof pressure for 45 seconds  . COLONOSCOPY N/A 03/01/2017   Procedure: COLONOSCOPY;  Surgeon: Kaitlin Binder, MD;  [facility];  Service: Endoscopy;  Laterality: N/A;  1:00pm  . ESOPHAGOGASTRODUODENOSCOPY N/A 01/28/2017   Procedure: ESOPHAGOGASTRODUODENOSCOPY (EGD);  Surgeon: Kaitlin Bears, MD;  Location: [facility] ENDOSCOPY;  Service: Gastroenterology;  Laterality: N/A;  . GIVENS CAPSULE STUDY N/A 03/12/2017   Procedure: GIVENS CAPSULE STUDY;  Surgeon: Kaitlin Binder, MD;  [facility];  Service: Endoscopy;  Laterality: N/A;  7:30am  . TUBAL LIGATION       SOCIAL HISTORY:  Social History   Socioeconomic History  . Marital status: Married    Spouse name: Kaitlin [name]  . Number of children: 3  . Years of education: 21  . Highest education level: Not on file  Occupational History  . Occupation: RETIRED    Comment: warehouse/textiles  Social Needs  . Financial resource strain: Not hard at all  . Food insecurity:    Worry: Never true    Inability: Never true  . Transportation needs:    Medical: No    Non-medical: No  Tobacco Use  . Smoking status: Current Every Day Smoker    Packs/day: 1.00    Years: 49.00    Pack years: 49.00    Types: Cigarettes    Start date: 02/09/1967    Last attempt to quit: 12/30/2016    Years since quitting: 0.5  . Smokeless tobacco: Never Used  Substance and Sexual Activity  . Alcohol use: No  .  Drug use: No  . Sexual activity: Yes    Birth control/protection: Post-menopausal  Lifestyle  . Physical activity:    Days per week: 5 days    Minutes per session: 20 min  . Stress: Only a little  Relationships  . Social connections:    Talks on phone: More than three times a week    Gets together: More than three times a week    Attends religious service: Never    Active member of club or organization: No    Attends meetings of clubs or organizations: Never    Relationship status: Married  . Intimate partner violence:    Fear of current or ex partner: No    Emotionally abused: No    Physically abused: No    Forced sexual activity: No   Other Topics Concern  . Not on file  Social History Narrative   Retired   Has an [employer] in [employer] with husband Kaitlin [name] for 23 years   Stays busy with home, gardens, likes to can    FAMILY HISTORY:  Family History  Problem Relation Age of Onset  . Heart attack Mother   . Hyperlipidemia Mother   . Hypertension Mother   . Diabetes Mother   . Heart attack Father   . Early death Father 59  . Hyperlipidemia Father   . Hypertension Father   . Heart disease Sister   . Hyperlipidemia Sister   . Hypertension Sister   . Diabetes Brother   . Heart disease Sister   . Hyperlipidemia Sister   . Hypertension Sister   . Heart attack Sister   . Heart disease Sister   . Hyperlipidemia Sister   . Hypertension Sister   . Heart attack Brother   . Early death Brother 51  . Hyperlipidemia Brother   . Hypertension Brother   . Heart attack Brother   . Early death Brother 20  . Hyperlipidemia Brother   . Hypertension Brother   . Cancer Maternal Aunt   . Colon cancer Neg Hx   . Colon polyps Neg Hx     CURRENT MEDICATIONS:  Outpatient Encounter Medications as of 07/09/2017  Medication Sig  . aspirin 81 MG chewable tablet Chew 81 mg by mouth daily.  . clopidogrel (PLAVIX) 75 MG tablet Take 75 mg by mouth daily.  . Cyanocobalamin (VITAMIN B 12 PO) Take by mouth.  . ferrous sulfate 325 (65 FE) MG tablet Take 325 mg by mouth daily with breakfast.  . lisinopril (PRINIVIL,ZESTRIL) 5 MG tablet Take 2.5 mg by mouth daily.  Marland Kitchen NITROSTAT 0.4 MG SL tablet PLACE 1 TABLET UNDER TONGUE EVERY 5 MINUTES FOR 3 DOSES AS NEEDED.  Marland Kitchen pantoprazole (PROTONIX) 40 MG tablet TAKE ONE TABLET (40MG  TOTAL) BY MOUTH TWICE DAILY BEFORE A MEAL  . rosuvastatin (CRESTOR) 40 MG tablet TAKE ONE (1) TABLET BY MOUTH EVERY DAY (Patient taking differently: TAKE ONE TABLET(40mg ) BY MOUTH EVERY DAY)   No facility-administered encounter medications on file as of 07/09/2017.     ALLERGIES:  No Known Allergies   PHYSICAL EXAM:   ECOG Performance status: 1  Vitals:   07/09/17 1401  BP: 100/78  Pulse: 89  Resp: 16  Temp: 98.2 F (36.8 C)  SpO2: 99%   Filed Weights   07/09/17 1401  Weight: 131 lb 3.2 oz (59.5 kg)    Physical Exam Deferred.  LABORATORY DATA:  I have reviewed the labs as listed.  CBC    Component Value  Date/Time   WBC 4.4 07/08/2017 1104   RBC 3.99 07/08/2017 1104   HGB 12.3 07/08/2017 1104   HCT 39.1 07/08/2017 1104   PLT 181 07/08/2017 1104   MCV 98.0 07/08/2017 1104   MCH 30.8 07/08/2017 1104   MCHC 31.5 07/08/2017 1104   RDW 14.3 07/08/2017 1104   LYMPHSABS 1.9 04/19/2017 1502   MONOABS 0.3 04/19/2017 1502   EOSABS 0.2 04/19/2017 1502   BASOSABS 0.1 04/19/2017 1502   CMP Latest Ref Rng & Units 04/19/2017 02/12/2017 02/04/2017  Glucose 65 - 99 mg/dL 85 85 123  BUN 6 - 20 mg/dL 21(H) 25 35(H)  Creatinine 0.44 - 1.00 mg/dL 1.65(H) 1.69(H) 2.00(H)  Sodium 135 - 145 mmol/L 140 142 139  Potassium 3.5 - 5.1 mmol/L 3.7 4.6 4.3  Chloride 101 - 111 mmol/L 106 109 106  CO2 22 - 32 mmol/L 23 25 23   Calcium 8.9 - 10.3 mg/dL 8.9 9.1 8.7  Total Protein 6.5 - 8.1 g/dL 7.6 - -  Total Bilirubin 0.3 - 1.2 mg/dL 0.3 - -  Alkaline Phos 38 - 126 U/L 113 - -  AST 15 - 41 U/L 19 - -  ALT 14 - 54 U/L 13(L) - -         ASSESSMENT & PLAN:   Normocytic anemia 1.  Normocytic anemia: Combination anemia from chronic kidney disease, will do iron deficiency and B12 deficiency.  -EGD on 01/28/2017 showed chronic gastritis, colonoscopy on 03/01/2017 showed tubular adenoma and external hemorrhoids with no clear source of bleeding. -Stool study was inconclusive -At her last visit 2 months ago, I have increased her iron to twice daily.  She is tolerating it very well.  Her ferritin has improved from 25 in March to 59 at this visit.  Percent saturation has slightly gone down from 38-28.  Hemoglobinhas improved to 12.3.  Hence I have recommended continuing iron twice daily.  She has also started vitamin  B12 1 mg daily at last visit.  B12 levels are normal today.  She will also continue B12 daily.  We will see her back in 4 months with repeat blood work few days ahead.  2.  CAD with stents: She is currently taking aspirin daily.  She has started back on Plavix.  No bleeding or dark stools was noted.  3.  Hypertension: She is currently taking lisinopril 2.5 mg daily.  Metoprolol is on hold.  She does report occasional dizziness when standing up.  She is on the lowest dose of lisinopril.  I have told her to contact her cardiologist.      Orders placed this encounter:  Orders Placed This Encounter  Procedures  . CBC with Differential  . Comprehensive metabolic panel  . Vitamin B12  . Folate  . Iron and TIBC  . Ferritin      Derek Jack, MD [place] [phone]

## 2017-07-18 ENCOUNTER — Ambulatory Visit (INDEPENDENT_AMBULATORY_CARE_PROVIDER_SITE_OTHER): Payer: Medicare Other | Admitting: Gastroenterology

## 2017-07-18 ENCOUNTER — Encounter: Payer: Self-pay | Admitting: Gastroenterology

## 2017-07-18 ENCOUNTER — Other Ambulatory Visit: Payer: Self-pay | Admitting: *Deleted

## 2017-07-18 DIAGNOSIS — I6523 Occlusion and stenosis of bilateral carotid arteries: Secondary | ICD-10-CM

## 2017-07-18 DIAGNOSIS — R112 Nausea with vomiting, unspecified: Secondary | ICD-10-CM | POA: Diagnosis not present

## 2017-07-18 DIAGNOSIS — K8020 Calculus of gallbladder without cholecystitis without obstruction: Secondary | ICD-10-CM

## 2017-07-18 DIAGNOSIS — D649 Anemia, unspecified: Secondary | ICD-10-CM

## 2017-07-18 DIAGNOSIS — K828 Other specified diseases of gallbladder: Secondary | ICD-10-CM

## 2017-07-18 NOTE — Progress Notes (Signed)
ON RECALL  °

## 2017-07-18 NOTE — Progress Notes (Signed)
Subjective:    Patient ID: Kaitlin [name], female    DOB: [birth year], 67 y.o.   MRN: [id number]  Doree Albee, MD   HPI FEELS NAUSEATED--> VOMITS 2X/MO. FEELS A SWEAT COMING ON AND FEEELS OVER HEATED AND THREW UP(NO BLOOD). NO PAION ASSOCIATED WITH IT. SWEATS A LOT. SYMPTOMS PASSED AFTER 35-45 MINS. MOST OF THE TIME HASN'T EATEN WHEN SHE GETS SICK. BMs: AT LEAST 5 TIMES A WEEK. MAY EAY GHOST PEPPER JAM(SML AMOUNT).   PT DENIES FEVER, CHILLS, HEMATOCHEZIA, HEMATEMESIS, melena, diarrhea, CHEST PAIN, SHORTNESS OF BREATH,  CHANGE IN BOWEL IN HABITS, constipation, abdominal pain, problems swallowing, OR heartburn or indigestion.  Past Medical History:  Diagnosis Date  . Arthritis   . CAD (coronary artery disease)    stents  . COPD (chronic obstructive pulmonary disease) ([place] [facility])   . Emphysema of lung ([place])   . GERD (gastroesophageal reflux disease)   . Hyperlipidemia   . Hypertension   . Iliac artery stenosis, right (HCC)    60%-70%  . Myocardial infarction ([place])   . Normocytic anemia 01/26/2017  . PAD (peripheral artery disease) ([place]) 12/14/2010   in the left vertebral, bilateral carotids    Past Surgical History:  Procedure Laterality Date  . CARDIAC CATHETERIZATION  02/04/2005   A cypher 5.3 x 18 mm at 16 atmosphere of pressure in the mid LAD, this stent covered the proximal part of the LAD and also the mid LAD, this was then post dilated with 3.25 x 15 mm Quantum at 16 atmosphereof pressure for 45 seconds  . COLONOSCOPY N/A 03/01/2017   Procedure: COLONOSCOPY;  Surgeon: Danie Binder, MD;  [facility];  Service: Endoscopy;  Laterality: N/A;  1:00pm  . ESOPHAGOGASTRODUODENOSCOPY N/A 01/28/2017   Procedure: ESOPHAGOGASTRODUODENOSCOPY (EGD);  Surgeon: Jerene Bears, MD;  Location: [facility] ENDOSCOPY;  Service: Gastroenterology;  Laterality: N/A;  . GIVENS CAPSULE STUDY N/A 03/12/2017   Procedure: GIVENS CAPSULE STUDY;  Surgeon: Danie Binder, MD;  [facility];  Service: Endoscopy;  Laterality: N/A;  7:30am  . TUBAL LIGATION     No Known Allergies  Current Outpatient Medications  Medication Sig    . aspirin 81 MG chewable tablet Chew 81 mg by mouth daily.    . clopidogrel (PLAVIX) 75 MG tablet Take 75 mg by mouth daily.    . Cyanocobalamin (VITAMIN B 12 PO) Take by mouth daily.     . ferrous sulfate 325 (65 FE) MG tablet Take 325 mg by mouth daily with breakfast.    . lisinopril (PRINIVIL,ZESTRIL) 5 MG tablet Take 2.5 mg by mouth daily.    Marland Kitchen NITROSTAT 0.4 MG SL tablet PLACE 1 TABLET UNDER TONGUE EVERY 5 MINUTES FOR 3 DOSES AS NEEDED.    Marland Kitchen pantoprazole (PROTONIX) 40 MG tablet TAKE ONE TABLET (40MG  TOTAL) BY MOUTH TWICE DAILY BEFORE A MEAL    . rosuvastatin (CRESTOR) 40 MG tablet TAKE ONE (1) TABLET BY MOUTH EVERY DAY (Patient taking differently: TAKE ONE TABLET(40mg ) BY MOUTH EVERY DAY)     Review of Systems PER HPI OTHERWISE ALL SYSTEMS ARE NEGATIVE.    Objective:   Physical Exam  Constitutional: She is oriented to person, place, and time. She appears well-developed and well-nourished. No distress.  HENT:  Head: Normocephalic and atraumatic.  Mouth/Throat: Oropharynx is clear and moist. No oropharyngeal exudate.  Eyes: Pupils are equal, round, and reactive to light. No scleral icterus.  Neck: Normal range of motion. Neck supple.  Cardiovascular:  Normal rate, regular rhythm and normal heart sounds.  Pulmonary/Chest: Effort normal and breath sounds normal. No respiratory distress.  Abdominal: Soft. Bowel sounds are normal. She exhibits no distension. There is tenderness. There is no rebound and no guarding.  MILD TTP IN THE EPIGASTRIUM   Musculoskeletal: She exhibits no edema.  Lymphadenopathy:    She has no cervical adenopathy.  Neurological: She is alert and oriented to person, place, and time.  NO  NEW FOCAL DEFICITS  Skin:  INCISIONS WELL HEALED ON R NECK  Psychiatric: She has a normal mood and affect.  Vitals reviewed.       Assessment & Plan:

## 2017-07-18 NOTE — Assessment & Plan Note (Signed)
NO BRBPR OR MELENA. TOLERATING B12 SHOTS AND PO  IRON.   CONTINUE ASPIRIN AND PLAVIX. THE BENEFITS OUTWEIGH THE RISKS. PLEASE CALL WITH QUESTIONS OR CONCERNS, FOR BLACK TARRY STOOLS, OR RECTAL BLEEDING  FOLLOW UP IN 6 MOS.

## 2017-07-18 NOTE — Progress Notes (Signed)
cc'd to pcp 

## 2017-07-18 NOTE — Patient Instructions (Addendum)
CONTINUE ASPIRIN AND PLAVIX. THE BENEFITS OUTWEIGH THE RISKS.  PLEASE CALL WITH QUESTIONS OR CONCERNS, FOR BLACK TARRY STOOLS, OR RECTAL BLEEDING   See DR. JENKINS TO DISCUSS BENEFITS V. RISKS OF REMOVING GALLBLADDER.   CONTINUE PROTONIX BUT CUT DOWN TO ONCE DAILY IF YOU CAN TOLERATE THE LOWER DOSE. TAKE 30 MINUTES PRIOR TO MEALS ONCE OR TWICE DAILY.   FOLLOW UP IN 6 MOS.

## 2017-07-18 NOTE — Assessment & Plan Note (Signed)
MAY BE DUE TO DISEASE GALLBLADDER. TREATED WITH HIGH DOSE PPI FOR > 2-3 MOS AND SYMPTOMS CONTINUE.  See DR. JENKINS TO DISCUSS BENEFITS V. RISKS OF REMOVING GALLBLADDER. CONTINUE PROTONIX. TAKE 30 MINUTES PRIOR TO MEALS ONCE OR TWICE DAILY.   FOLLOW UP IN 6 MOS.

## 2017-07-19 NOTE — Progress Notes (Signed)
cc'ed to pcp °

## 2017-08-06 ENCOUNTER — Encounter: Payer: Self-pay | Admitting: General Surgery

## 2017-08-06 ENCOUNTER — Ambulatory Visit (INDEPENDENT_AMBULATORY_CARE_PROVIDER_SITE_OTHER): Payer: Medicare Other | Admitting: General Surgery

## 2017-08-06 VITALS — BP 105/63 | HR 99 | Temp 97.7°F | Ht 61.0 in | Wt 129.0 lb

## 2017-08-06 DIAGNOSIS — K8020 Calculus of gallbladder without cholecystitis without obstruction: Secondary | ICD-10-CM | POA: Diagnosis not present

## 2017-08-06 DIAGNOSIS — I6523 Occlusion and stenosis of bilateral carotid arteries: Secondary | ICD-10-CM

## 2017-08-06 NOTE — Progress Notes (Signed)
Kaitlin [name]; [id number]; Jul 10, 1950   HPI Patient is a 67 year old white female who was referred to my care by Dr. Oneida Alar for evaluation treatment of gallstones.  Patient has had intermittent episodes of epigastric abdominal pain and bloating and nausea over the past few months.  This seems to occur about 3-4 times monthly.  It is made worse with some fatty foods.  She has had intermittent episodes of right upper quadrant abdominal pain with radiation to her right flank.  No fever, chills, jaundice have been noted.  She currently has 0 out of 10 abdominal pain. Past Medical History:  Diagnosis Date  . Arthritis   . CAD (coronary artery disease)    stents  . COPD (chronic obstructive pulmonary disease) ([place])   . Emphysema of lung ([place])   . GERD (gastroesophageal reflux disease)   . Hyperlipidemia   . Hypertension   . Iliac artery stenosis, right (HCC)    60%-70%  . Myocardial infarction ([place])   . Normocytic anemia 01/26/2017  . PAD (peripheral artery disease) ([place]) 12/14/2010   in the left vertebral, bilateral carotids    Past Surgical History:  Procedure Laterality Date  . CARDIAC CATHETERIZATION  02/04/2005   A cypher 5.3 x 18 mm at 16 atmosphere of pressure in the mid LAD, this stent covered the proximal part of the LAD and also the mid LAD, this was then post dilated with 3.25 x 15 mm Quantum at 16 atmosphereof pressure for 45 seconds  . COLONOSCOPY N/A 03/01/2017   Procedure: COLONOSCOPY;  Surgeon: Danie Binder, MD;  [facility];  Service: Endoscopy;  Laterality: N/A;  1:00pm  . ESOPHAGOGASTRODUODENOSCOPY N/A 01/28/2017   Procedure: ESOPHAGOGASTRODUODENOSCOPY (EGD);  Surgeon: Jerene Bears, MD;  Location: [facility] ENDOSCOPY;  Service: Gastroenterology;  Laterality: N/A;  . GIVENS CAPSULE STUDY N/A 03/12/2017   Procedure: GIVENS CAPSULE STUDY;  Surgeon: Danie Binder, MD;  [facility];  Service: Endoscopy;  Laterality: N/A;  7:30am  . TUBAL LIGATION       Family History  Problem Relation Age of Onset  . Heart attack Mother   . Hyperlipidemia Mother   . Hypertension Mother   . Diabetes Mother   . Heart attack Father   . Early death Father 77  . Hyperlipidemia Father   . Hypertension Father   . Heart disease Sister   . Hyperlipidemia Sister   . Hypertension Sister   . Diabetes Brother   . Heart disease Sister   . Hyperlipidemia Sister   . Hypertension Sister   . Heart attack Sister   . Heart disease Sister   . Hyperlipidemia Sister   . Hypertension Sister   . Heart attack Brother   . Early death Brother 72  . Hyperlipidemia Brother   . Hypertension Brother   . Heart attack Brother   . Early death Brother 51  . Hyperlipidemia Brother   . Hypertension Brother   . Cancer Maternal Aunt   . Colon cancer Neg Hx   . Colon polyps Neg Hx     Current Outpatient Medications on File Prior to Visit  Medication Sig Dispense Refill  . aspirin 81 MG chewable tablet Chew 81 mg by mouth daily.    . clopidogrel (PLAVIX) 75 MG tablet Take 75 mg by mouth daily.    . Cyanocobalamin (VITAMIN B 12 PO) Take by mouth daily.     . ferrous sulfate 325 (65 FE) MG tablet Take 325 mg by mouth daily  with breakfast.    . lisinopril (PRINIVIL,ZESTRIL) 5 MG tablet Take 2.5 mg by mouth daily.    Marland Kitchen NITROSTAT 0.4 MG SL tablet PLACE 1 TABLET UNDER TONGUE EVERY 5 MINUTES FOR 3 DOSES AS NEEDED. 25 tablet 3  . pantoprazole (PROTONIX) 40 MG tablet TAKE ONE TABLET (40MG  TOTAL) BY MOUTH TWICE DAILY BEFORE A MEAL 60 tablet 5  . rosuvastatin (CRESTOR) 40 MG tablet TAKE ONE (1) TABLET BY MOUTH EVERY DAY (Patient taking differently: TAKE ONE TABLET(40mg ) BY MOUTH EVERY DAY) 90 tablet 2   No current facility-administered medications on file prior to visit.     No Known Allergies  Social History   Substance and Sexual Activity  Alcohol Use No    Social History   Tobacco Use  Smoking Status Current Every Day Smoker  . Packs/day: 1.00  . Years: 49.00  .  Pack years: 49.00  . Types: Cigarettes  . Start date: 02/09/1967  . Last attempt to quit: 12/30/2016  . Years since quitting: 0.6  Smokeless Tobacco Never Used    Review of Systems  Constitutional: Positive for malaise/fatigue.  HENT: Negative.   Eyes: Negative.   Respiratory: Positive for shortness of breath.   Cardiovascular: Negative.   Gastrointestinal: Positive for nausea.  Genitourinary: Negative.   Musculoskeletal: Positive for back pain and joint pain.  Skin: Negative.   Neurological: Negative.   Endo/Heme/Allergies: Negative.   Psychiatric/Behavioral: Negative.     Objective   Vitals:   08/06/17 1414  BP: 105/63  Pulse: 99  Temp: 97.7 F (36.5 C)    Physical Exam  Constitutional: She is oriented to person, place, and time. She appears well-developed and well-nourished. No distress.  HENT:  Head: Normocephalic and atraumatic.  Eyes: No scleral icterus.  Abdominal: Soft. Normal appearance and bowel sounds are normal. She exhibits no mass. There is no hepatomegaly. There is no tenderness. There is negative Murphy's sign. No hernia.  Neurological: She is alert and oriented to person, place, and time.  Skin: Skin is warm and dry.  Vitals reviewed.  Dr. Oneida Alar' notes reviewed Assessment   Biliary colic, cholelithiasis  Plan   Patient is scheduled for laparoscopic cholecystectomy on 08/16/2017.  The risks and benefits of the procedure including bleeding, infection, hepatobiliary injury, and the possibility of an open procedure were fully explained to the patient, who gave informed consent.  Patient is to stop her Plavix 1 week prior to surgery.

## 2017-08-06 NOTE — H&P (Signed)
Kaitlin [name]; [id number]; Sep 13, 1950   HPI Patient is a 67 year old white female who was referred to my care by Dr. Oneida Alar for evaluation treatment of gallstones.  Patient has had intermittent episodes of epigastric abdominal pain and bloating and nausea over the past few months.  This seems to occur about 3-4 times monthly.  It is made worse with some fatty foods.  She has had intermittent episodes of right upper quadrant abdominal pain with radiation to her right flank.  No fever, chills, jaundice have been noted.  She currently has 0 out of 10 abdominal pain. Past Medical History:  Diagnosis Date  . Arthritis   . CAD (coronary artery disease)    stents  . COPD (chronic obstructive pulmonary disease) ([place])   . Emphysema of lung ([place])   . GERD (gastroesophageal reflux disease)   . Hyperlipidemia   . Hypertension   . Iliac artery stenosis, right (H[facility])    60%-70%  . Myocardial infarction ([place])   . Normocytic anemia 01/26/2017  . PAD (peripheral artery disease) ([place]) 12/14/2010   in the left vertebral, bilateral carotids    Past Surgical History:  Procedure Laterality Date  . CARDIAC CATHETERIZATION  02/04/2005   A cypher 5.3 x 18 mm at 16 atmosphere of pressure in the mid LAD, this stent covered the proximal part of the LAD and also the mid LAD, this was then post dilated with 3.25 x 15 mm Quantum at 16 atmosphereof pressure for 45 seconds  . COLONOSCOPY N/A 03/01/2017   Procedure: COLONOSCOPY;  Surgeon: Danie Binder, MD;  Location: [facility];  Service: Endoscopy;  Laterality: N/A;  1:00pm  . ESOPHAGOGASTRODUODENOSCOPY N/A 01/28/2017   Procedure: ESOPHAGOGASTRODUODENOSCOPY (EGD);  Surgeon: Jerene Bears, MD;  Location: [facility] ENDOSCOPY;  Service: Gastroenterology;  Laterality: N/A;  . GIVENS CAPSULE STUDY N/A 03/12/2017   Procedure: GIVENS CAPSULE STUDY;  Surgeon: Danie Binder, MD;  Location: [facility];  Service: Endoscopy;  Laterality: N/A;  7:30am  . TUBAL LIGATION       Family History  Problem Relation Age of Onset  . Heart attack Mother   . Hyperlipidemia Mother   . Hypertension Mother   . Diabetes Mother   . Heart attack Father   . Early death Father 16  . Hyperlipidemia Father   . Hypertension Father   . Heart disease Sister   . Hyperlipidemia Sister   . Hypertension Sister   . Diabetes Brother   . Heart disease Sister   . Hyperlipidemia Sister   . Hypertension Sister   . Heart attack Sister   . Heart disease Sister   . Hyperlipidemia Sister   . Hypertension Sister   . Heart attack Brother   . Early death Brother 39  . Hyperlipidemia Brother   . Hypertension Brother   . Heart attack Brother   . Early death Brother 49  . Hyperlipidemia Brother   . Hypertension Brother   . Cancer Maternal Aunt   . Colon cancer Neg Hx   . Colon polyps Neg Hx     Current Outpatient Medications on File Prior to Visit  Medication Sig Dispense Refill  . aspirin 81 MG chewable tablet Chew 81 mg by mouth daily.    . clopidogrel (PLAVIX) 75 MG tablet Take 75 mg by mouth daily.    . Cyanocobalamin (VITAMIN B 12 PO) Take by mouth daily.     . ferrous sulfate 325 (65 FE) MG tablet Take 325 mg by mouth daily  with breakfast.    . lisinopril (PRINIVIL,ZESTRIL) 5 MG tablet Take 2.5 mg by mouth daily.    Marland Kitchen NITROSTAT 0.4 MG SL tablet PLACE 1 TABLET UNDER TONGUE EVERY 5 MINUTES FOR 3 DOSES AS NEEDED. 25 tablet 3  . pantoprazole (PROTONIX) 40 MG tablet TAKE ONE TABLET (40MG  TOTAL) BY MOUTH TWICE DAILY BEFORE A MEAL 60 tablet 5  . rosuvastatin (CRESTOR) 40 MG tablet TAKE ONE (1) TABLET BY MOUTH EVERY DAY (Patient taking differently: TAKE ONE TABLET(40mg ) BY MOUTH EVERY DAY) 90 tablet 2   No current facility-administered medications on file prior to visit.     No Known Allergies  Social History   Substance and Sexual Activity  Alcohol Use No    Social History   Tobacco Use  Smoking Status Current Every Day Smoker  . Packs/day: 1.00  . Years: 49.00  .  Pack years: 49.00  . Types: Cigarettes  . Start date: 02/09/1967  . Last attempt to quit: 12/30/2016  . Years since quitting: 0.6  Smokeless Tobacco Never Used    Review of Systems  Constitutional: Positive for malaise/fatigue.  HENT: Negative.   Eyes: Negative.   Respiratory: Positive for shortness of breath.   Cardiovascular: Negative.   Gastrointestinal: Positive for nausea.  Genitourinary: Negative.   Musculoskeletal: Positive for back pain and joint pain.  Skin: Negative.   Neurological: Negative.   Endo/Heme/Allergies: Negative.   Psychiatric/Behavioral: Negative.     Objective   Vitals:   08/06/17 1414  BP: 105/63  Pulse: 99  Temp: 97.7 F (36.5 C)    Physical Exam  Constitutional: She is oriented to person, place, and time. She appears well-developed and well-nourished. No distress.  HENT:  Head: Normocephalic and atraumatic.  Eyes: No scleral icterus.  Abdominal: Soft. Normal appearance and bowel sounds are normal. She exhibits no mass. There is no hepatomegaly. There is no tenderness. There is negative Murphy's sign. No hernia.  Neurological: She is alert and oriented to person, place, and time.  Skin: Skin is warm and dry.  Vitals reviewed.  Dr. Oneida Alar' notes reviewed Assessment   Biliary colic, cholelithiasis  Plan   Patient is scheduled for laparoscopic cholecystectomy on 08/16/2017.  The risks and benefits of the procedure including bleeding, infection, hepatobiliary injury, and the possibility of an open procedure were fully explained to the patient, who gave informed consent.  Patient is to stop her Plavix 1 week prior to surgery.

## 2017-08-06 NOTE — Patient Instructions (Signed)

## 2017-08-13 NOTE — Patient Instructions (Signed)
Kaitlin [name]  08/13/2017     @PREFPERIOPPHARMACY @   Your procedure is scheduled on 08/16/2017.  Report to [facility] at 6:15 A.M.  Call this number if you have problems the morning of surgery:  [phone]   Remember:  Do not eat or drink after midnight.   Take these medicines the morning of surgery with A SIP OF WATER : Lisinopril and Protonix    Do not wear jewelry, make-up or nail polish.  Do not wear lotions, powders, or perfumes, or deodorant.  Do not shave 48 hours prior to surgery.  Men may shave face and neck.  Do not bring valuables to the hospital.  [facility] is not responsible for any belongings or valuables.  Contacts, dentures or bridgework may not be worn into surgery.  Leave your suitcase in the car.  After surgery it may be brought to your room.  For patients admitted to the hospital, discharge time will be determined by your treatment team.  Patients discharged the day of surgery will not be allowed to drive home.   Name and phone number of your driver:   family Special instructions:  n/a  Please read over the following fact sheets that you were given. Care and Recovery After Surgery    General Anesthesia, Adult, Care After These instructions provide you with information about caring for yourself after your procedure. Your health care provider may also give you more specific instructions. Your treatment has been planned according to current medical practices, but problems sometimes occur. Call your health care provider if you have any problems or questions after your procedure. What can I expect after the procedure? After the procedure, it is common to have:  Vomiting.  A sore throat.  Mental slowness.  It is common to feel:  Nauseous.  Cold or shivery.  Sleepy.  Tired.  Sore or achy, even in parts of your body where you did not have surgery.  Follow these instructions at home: For at least 24 hours after the procedure:  Do  not: ? Participate in activities where you could fall or become injured. ? Drive. ? Use heavy machinery. ? Drink alcohol. ? Take sleeping pills or medicines that cause drowsiness. ? Make important decisions or sign legal documents. ? Take care of children on your own.  Rest. Eating and drinking  If you vomit, drink water, juice, or soup when you can drink without vomiting.  Drink enough fluid to keep your urine clear or pale yellow.  Make sure you have little or no nausea before eating solid foods.  Follow the diet recommended by your health care provider. General instructions  Have a responsible adult stay with you until you are awake and alert.  Return to your normal activities as told by your health care provider. Ask your health care provider what activities are safe for you.  Take over-the-counter and prescription medicines only as told by your health care provider.  If you smoke, do not smoke without supervision.  Keep all follow-up visits as told by your health care provider. This is important. Contact a health care provider if:  You continue to have nausea or vomiting at home, and medicines are not helpful.  You cannot drink fluids or start eating again.  You cannot urinate after 8-12 hours.  You develop a skin rash.  You have fever.  You have increasing redness at the site of your procedure. Get help right away if:  You have difficulty breathing.  You have chest pain.  You have unexpected bleeding.  You feel that you are having a life-threatening or urgent problem. This information is not intended to replace advice given to you by your health care provider. Make sure you discuss any questions you have with your health care provider. Document Released: 04/23/2000 Document Revised: 06/20/2015 Document Reviewed: 12/30/2014 Elsevier Interactive Patient Education  2018 [employer].  Laparoscopic Cholecystectomy Laparoscopic cholecystectomy is surgery to  remove the gallbladder. The gallbladder is a pear-shaped organ that lies beneath the liver on the right side of the body. The gallbladder stores bile, which is a fluid that helps the body to digest fats. Cholecystectomy is often done for inflammation of the gallbladder (cholecystitis). This condition is usually caused by a buildup of gallstones (cholelithiasis) in the gallbladder. Gallstones can block the flow of bile, which can result in inflammation and pain. In severe cases, emergency surgery may be required. This procedure is done though small incisions in your abdomen (laparoscopic surgery). A thin scope with a camera (laparoscope) is inserted through one incision. Thin surgical instruments are inserted through the other incisions. In some cases, a laparoscopic procedure may be turned into a type of surgery that is done through a larger incision (open surgery). Tell a health care provider about:  Any allergies you have.  All medicines you are taking, including vitamins, herbs, eye drops, creams, and over-the-counter medicines.  Any problems you or family members have had with anesthetic medicines.  Any blood disorders you have.  Any surgeries you have had.  Any medical conditions you have.  Whether you are pregnant or may be pregnant. What are the risks? Generally, this is a safe procedure. However, problems may occur, including:  Infection.  Bleeding.  Allergic reactions to medicines.  Damage to other structures or organs.  A stone remaining in the common bile duct. The common bile duct carries bile from the gallbladder into the small intestine.  A bile leak from the cyst duct that is clipped when your gallbladder is removed.  What happens before the procedure? Staying hydrated Follow instructions from your health care provider about hydration, which may include:  Up to 2 hours before the procedure - you may continue to drink clear liquids, such as water, clear fruit juice,  black coffee, and plain tea.  Eating and drinking restrictions Follow instructions from your health care provider about eating and drinking, which may include:  8 hours before the procedure - stop eating heavy meals or foods such as meat, fried foods, or fatty foods.  6 hours before the procedure - stop eating light meals or foods, such as toast or cereal.  6 hours before the procedure - stop drinking milk or drinks that contain milk.  2 hours before the procedure - stop drinking clear liquids.  Medicines  Ask your health care provider about: ? Changing or stopping your regular medicines. This is especially important if you are taking diabetes medicines or blood thinners. ? Taking medicines such as aspirin and ibuprofen. These medicines can thin your blood. Do not take these medicines before your procedure if your health care provider instructs you not to.  You may be given antibiotic medicine to help prevent infection. General instructions  Let your health care provider know if you develop a cold or an infection before surgery.  Plan to have someone take you home from the hospital or clinic.  Ask your health care provider how your surgical site will be marked or identified. What happens  during the procedure?  To reduce your risk of infection: ? Your health care team will wash or sanitize their hands. ? Your skin will be washed with soap. ? Hair may be removed from the surgical area.  An IV tube may be inserted into one of your veins.  You will be given one or more of the following: ? A medicine to help you relax (sedative). ? A medicine to make you fall asleep (general anesthetic).  A breathing tube will be placed in your mouth.  Your surgeon will make several small cuts (incisions) in your abdomen.  The laparoscope will be inserted through one of the small incisions. The camera on the laparoscope will send images to a TV screen (monitor) in the operating room. This lets  your surgeon see inside your abdomen.  Air-like gas will be pumped into your abdomen. This will expand your abdomen to give the surgeon more room to perform the surgery.  Other tools that are needed for the procedure will be inserted through the other incisions. The gallbladder will be removed through one of the incisions.  Your common bile duct may be examined. If stones are found in the common bile duct, they may be removed.  After your gallbladder has been removed, the incisions will be closed with stitches (sutures), staples, or skin glue.  Your incisions may be covered with a bandage (dressing). The procedure may vary among health care providers and hospitals. What happens after the procedure?  Your blood pressure, heart rate, breathing rate, and blood oxygen level will be monitored until the medicines you were given have worn off.  You will be given medicines as needed to control your pain.  Do not drive for 24 hours if you were given a sedative. This information is not intended to replace advice given to you by your health care provider. Make sure you discuss any questions you have with your health care provider. Document Released: 01/15/2005 Document Revised: 08/07/2015 Document Reviewed: 07/04/2015 Elsevier Interactive Patient Education  2018 [employer].

## 2017-08-14 ENCOUNTER — Encounter (HOSPITAL_COMMUNITY)
Admission: RE | Admit: 2017-08-14 | Discharge: 2017-08-14 | Disposition: A | Discharge status: Home / Self Care | Payer: Medicare Other | Source: Ambulatory Visit | Attending: General Surgery | Admitting: General Surgery

## 2017-08-14 ENCOUNTER — Other Ambulatory Visit: Payer: Self-pay

## 2017-08-14 ENCOUNTER — Encounter (HOSPITAL_COMMUNITY): Payer: Self-pay

## 2017-08-14 DIAGNOSIS — I2510 Atherosclerotic heart disease of native coronary artery without angina pectoris: Secondary | ICD-10-CM | POA: Diagnosis not present

## 2017-08-14 DIAGNOSIS — I739 Peripheral vascular disease, unspecified: Secondary | ICD-10-CM | POA: Diagnosis not present

## 2017-08-14 DIAGNOSIS — I252 Old myocardial infarction: Secondary | ICD-10-CM | POA: Diagnosis not present

## 2017-08-14 DIAGNOSIS — K8064 Calculus of gallbladder and bile duct with chronic cholecystitis without obstruction: Secondary | ICD-10-CM | POA: Diagnosis not present

## 2017-08-14 DIAGNOSIS — E785 Hyperlipidemia, unspecified: Secondary | ICD-10-CM | POA: Diagnosis not present

## 2017-08-14 DIAGNOSIS — Z79899 Other long term (current) drug therapy: Secondary | ICD-10-CM | POA: Diagnosis not present

## 2017-08-14 DIAGNOSIS — K219 Gastro-esophageal reflux disease without esophagitis: Secondary | ICD-10-CM | POA: Diagnosis not present

## 2017-08-14 DIAGNOSIS — F17210 Nicotine dependence, cigarettes, uncomplicated: Secondary | ICD-10-CM | POA: Diagnosis not present

## 2017-08-14 DIAGNOSIS — Z7982 Long term (current) use of aspirin: Secondary | ICD-10-CM | POA: Diagnosis not present

## 2017-08-14 DIAGNOSIS — Z7902 Long term (current) use of antithrombotics/antiplatelets: Secondary | ICD-10-CM | POA: Diagnosis not present

## 2017-08-14 DIAGNOSIS — Z8249 Family history of ischemic heart disease and other diseases of the circulatory system: Secondary | ICD-10-CM | POA: Diagnosis not present

## 2017-08-14 DIAGNOSIS — J439 Emphysema, unspecified: Secondary | ICD-10-CM | POA: Diagnosis not present

## 2017-08-14 DIAGNOSIS — K8070 Calculus of gallbladder and bile duct without cholecystitis without obstruction: Secondary | ICD-10-CM | POA: Diagnosis present

## 2017-08-14 DIAGNOSIS — M1990 Unspecified osteoarthritis, unspecified site: Secondary | ICD-10-CM | POA: Diagnosis not present

## 2017-08-14 DIAGNOSIS — Z955 Presence of coronary angioplasty implant and graft: Secondary | ICD-10-CM | POA: Diagnosis not present

## 2017-08-14 DIAGNOSIS — I10 Essential (primary) hypertension: Secondary | ICD-10-CM | POA: Diagnosis not present

## 2017-08-14 LAB — COMPREHENSIVE METABOLIC PANEL
ALT: 13 U/L (ref 0–44)
AST: 19 U/L (ref 15–41)
Albumin: 4 g/dL (ref 3.5–5.0)
Alkaline Phosphatase: 101 U/L (ref 38–126)
Anion gap: 9 (ref 5–15)
BUN: 20 mg/dL (ref 8–23)
CHLORIDE: 106 mmol/L (ref 98–111)
CO2: 23 mmol/L (ref 22–32)
CREATININE: 1.73 mg/dL — AB (ref 0.44–1.00)
Calcium: 9.4 mg/dL (ref 8.9–10.3)
GFR calc Af Amer: 35 mL/min — ABNORMAL LOW (ref >60)
GFR calc non Af Amer: 30 mL/min — ABNORMAL LOW (ref >60)
Glucose, Bld: 104 mg/dL — ABNORMAL HIGH (ref 70–99)
POTASSIUM: 3.9 mmol/L (ref 3.5–5.1)
SODIUM: 138 mmol/L (ref 135–145)
Total Bilirubin: 0.5 mg/dL (ref 0.3–1.2)
Total Protein: 7.2 g/dL (ref 6.5–8.1)

## 2017-08-14 LAB — CBC WITH DIFFERENTIAL/PLATELET
BASOS ABS: 0 10*3/uL (ref 0.0–0.1)
Basophils Relative: 1 %
EOS ABS: 0.2 10*3/uL (ref 0.0–0.7)
EOS PCT: 5 %
HCT: 33.8 % — ABNORMAL LOW (ref 36.0–46.0)
Hemoglobin: 11.1 g/dL — ABNORMAL LOW (ref 12.0–15.0)
Lymphocytes Relative: 40 %
Lymphs Abs: 1.9 10*3/uL (ref 0.7–4.0)
MCH: 32.2 pg (ref 26.0–34.0)
MCHC: 32.8 g/dL (ref 30.0–36.0)
MCV: 98 fL (ref 78.0–100.0)
Monocytes Absolute: 0.2 10*3/uL (ref 0.1–1.0)
Monocytes Relative: 5 %
Neutro Abs: 2.2 10*3/uL (ref 1.7–7.7)
Neutrophils Relative %: 49 %
PLATELETS: 196 10*3/uL (ref 150–400)
RBC: 3.45 MIL/uL — AB (ref 3.87–5.11)
RDW: 13.6 % (ref 11.5–15.5)
WBC: 4.6 10*3/uL (ref 4.0–10.5)

## 2017-08-16 ENCOUNTER — Ambulatory Visit (HOSPITAL_COMMUNITY): Payer: Medicare Other | Admitting: Anesthesiology

## 2017-08-16 ENCOUNTER — Encounter (HOSPITAL_COMMUNITY)
Admission: RE | Disposition: A | Discharge status: Home / Self Care | Payer: Self-pay | Source: Ambulatory Visit | Attending: General Surgery

## 2017-08-16 ENCOUNTER — Ambulatory Visit (HOSPITAL_COMMUNITY)
Admission: RE | Admit: 2017-08-16 | Discharge: 2017-08-16 | Disposition: A | Discharge status: Home / Self Care | Payer: Medicare Other | Source: Ambulatory Visit | Attending: General Surgery | Admitting: General Surgery

## 2017-08-16 ENCOUNTER — Encounter (HOSPITAL_COMMUNITY): Payer: Self-pay | Admitting: *Deleted

## 2017-08-16 ENCOUNTER — Other Ambulatory Visit: Payer: Self-pay

## 2017-08-16 DIAGNOSIS — M1990 Unspecified osteoarthritis, unspecified site: Secondary | ICD-10-CM | POA: Insufficient documentation

## 2017-08-16 DIAGNOSIS — I252 Old myocardial infarction: Secondary | ICD-10-CM | POA: Diagnosis not present

## 2017-08-16 DIAGNOSIS — I739 Peripheral vascular disease, unspecified: Secondary | ICD-10-CM | POA: Diagnosis not present

## 2017-08-16 DIAGNOSIS — K8064 Calculus of gallbladder and bile duct with chronic cholecystitis without obstruction: Secondary | ICD-10-CM | POA: Diagnosis not present

## 2017-08-16 DIAGNOSIS — J439 Emphysema, unspecified: Secondary | ICD-10-CM | POA: Insufficient documentation

## 2017-08-16 DIAGNOSIS — F17210 Nicotine dependence, cigarettes, uncomplicated: Secondary | ICD-10-CM | POA: Insufficient documentation

## 2017-08-16 DIAGNOSIS — J449 Chronic obstructive pulmonary disease, unspecified: Secondary | ICD-10-CM | POA: Diagnosis not present

## 2017-08-16 DIAGNOSIS — K8010 Calculus of gallbladder with chronic cholecystitis without obstruction: Secondary | ICD-10-CM | POA: Diagnosis not present

## 2017-08-16 DIAGNOSIS — I2510 Atherosclerotic heart disease of native coronary artery without angina pectoris: Secondary | ICD-10-CM | POA: Insufficient documentation

## 2017-08-16 DIAGNOSIS — F17200 Nicotine dependence, unspecified, uncomplicated: Secondary | ICD-10-CM | POA: Diagnosis not present

## 2017-08-16 DIAGNOSIS — Z7982 Long term (current) use of aspirin: Secondary | ICD-10-CM | POA: Insufficient documentation

## 2017-08-16 DIAGNOSIS — K8020 Calculus of gallbladder without cholecystitis without obstruction: Secondary | ICD-10-CM

## 2017-08-16 DIAGNOSIS — E785 Hyperlipidemia, unspecified: Secondary | ICD-10-CM | POA: Diagnosis not present

## 2017-08-16 DIAGNOSIS — Z955 Presence of coronary angioplasty implant and graft: Secondary | ICD-10-CM | POA: Insufficient documentation

## 2017-08-16 DIAGNOSIS — I10 Essential (primary) hypertension: Secondary | ICD-10-CM | POA: Insufficient documentation

## 2017-08-16 DIAGNOSIS — K219 Gastro-esophageal reflux disease without esophagitis: Secondary | ICD-10-CM | POA: Insufficient documentation

## 2017-08-16 DIAGNOSIS — Z8249 Family history of ischemic heart disease and other diseases of the circulatory system: Secondary | ICD-10-CM | POA: Insufficient documentation

## 2017-08-16 DIAGNOSIS — Z7902 Long term (current) use of antithrombotics/antiplatelets: Secondary | ICD-10-CM | POA: Insufficient documentation

## 2017-08-16 DIAGNOSIS — Z79899 Other long term (current) drug therapy: Secondary | ICD-10-CM | POA: Insufficient documentation

## 2017-08-16 HISTORY — PX: CHOLECYSTECTOMY: SHX55

## 2017-08-16 SURGERY — LAPAROSCOPIC CHOLECYSTECTOMY
Anesthesia: General

## 2017-08-16 MED ORDER — SUGAMMADEX SODIUM 200 MG/2ML IV SOLN
INTRAVENOUS | Status: DC | PRN
  Administered 2017-08-16: 200 mg via INTRAVENOUS

## 2017-08-16 MED ORDER — PROPOFOL 10 MG/ML IV BOLUS
INTRAVENOUS | Status: DC | PRN
  Administered 2017-08-16: 110 mg via INTRAVENOUS

## 2017-08-16 MED ORDER — MIDAZOLAM HCL 2 MG/2ML IJ SOLN
INTRAMUSCULAR | Status: AC
  Filled 2017-08-16: qty 2

## 2017-08-16 MED ORDER — BUPIVACAINE HCL (PF) 0.5 % IJ SOLN
INTRAMUSCULAR | Status: AC
Start: 2017-08-16 — End: ?
  Filled 2017-08-16: qty 30

## 2017-08-16 MED ORDER — LACTATED RINGERS IV SOLN
INTRAVENOUS | Status: DC
  Administered 2017-08-16: 07:00:00 via INTRAVENOUS

## 2017-08-16 MED ORDER — ROCURONIUM BROMIDE 50 MG/5ML IV SOLN
INTRAVENOUS | Status: AC
  Filled 2017-08-16: qty 1

## 2017-08-16 MED ORDER — ONDANSETRON HCL 4 MG/2ML IJ SOLN
INTRAMUSCULAR | Status: DC | PRN
  Administered 2017-08-16: 4 mg via INTRAVENOUS

## 2017-08-16 MED ORDER — KETOROLAC TROMETHAMINE 30 MG/ML IJ SOLN
30.0000 mg | Freq: Once | INTRAMUSCULAR | Status: AC
  Administered 2017-08-16: 30 mg via INTRAVENOUS

## 2017-08-16 MED ORDER — CHLORHEXIDINE GLUCONATE CLOTH 2 % EX PADS: 6.0000 | MEDICATED_PAD | Freq: Once | CUTANEOUS | Status: DC

## 2017-08-16 MED ORDER — POVIDONE-IODINE 10 % EX OINT
TOPICAL_OINTMENT | CUTANEOUS | Status: AC
Start: 2017-08-16 — End: ?
  Filled 2017-08-16: qty 1

## 2017-08-16 MED ORDER — TRAMADOL HCL 50 MG PO TABS: 50.0000 mg | ORAL_TABLET | Freq: Four times a day (QID) | ORAL | 0 refills | Status: DC | PRN

## 2017-08-16 MED ORDER — PHENYLEPHRINE HCL 10 MG/ML IJ SOLN
INTRAMUSCULAR | Status: DC | PRN
  Administered 2017-08-16: 80 ug via INTRAVENOUS
  Administered 2017-08-16: 40 ug via INTRAVENOUS
  Administered 2017-08-16: 80 ug via INTRAVENOUS
  Administered 2017-08-16: 40 ug via INTRAVENOUS

## 2017-08-16 MED ORDER — CIPROFLOXACIN IN D5W 400 MG/200ML IV SOLN
400.0000 mg | INTRAVENOUS | Status: AC
  Administered 2017-08-16: 400 mg via INTRAVENOUS

## 2017-08-16 MED ORDER — POVIDONE-IODINE 10 % OINT PACKET
TOPICAL_OINTMENT | CUTANEOUS | Status: DC | PRN
  Administered 2017-08-16: 1 via TOPICAL

## 2017-08-16 MED ORDER — SODIUM CHLORIDE 0.9 % IR SOLN
Status: DC | PRN
  Administered 2017-08-16: 1000 mL

## 2017-08-16 MED ORDER — ROCURONIUM BROMIDE 100 MG/10ML IV SOLN
INTRAVENOUS | Status: DC | PRN
  Administered 2017-08-16: 30 mg via INTRAVENOUS

## 2017-08-16 MED ORDER — MIDAZOLAM HCL 5 MG/5ML IJ SOLN
INTRAMUSCULAR | Status: DC | PRN
  Administered 2017-08-16: 2 mg via INTRAVENOUS

## 2017-08-16 MED ORDER — CIPROFLOXACIN IN D5W 400 MG/200ML IV SOLN
INTRAVENOUS | Status: AC
  Filled 2017-08-16: qty 200

## 2017-08-16 MED ORDER — KETOROLAC TROMETHAMINE 30 MG/ML IJ SOLN
INTRAMUSCULAR | Status: AC
  Filled 2017-08-16: qty 1

## 2017-08-16 MED ORDER — HYDROCODONE-ACETAMINOPHEN 7.5-325 MG PO TABS
1.0000 | ORAL_TABLET | Freq: Once | ORAL | Status: AC | PRN
  Administered 2017-08-16: 1 via ORAL
  Filled 2017-08-16: qty 1

## 2017-08-16 MED ORDER — HEMOSTATIC AGENTS (NO CHARGE) OPTIME
TOPICAL | Status: DC | PRN
  Administered 2017-08-16: 1 via TOPICAL

## 2017-08-16 MED ORDER — FENTANYL CITRATE (PF) 100 MCG/2ML IJ SOLN
INTRAMUSCULAR | Status: DC | PRN
  Administered 2017-08-16: 75 ug via INTRAVENOUS
  Administered 2017-08-16: 25 ug via INTRAVENOUS

## 2017-08-16 MED ORDER — FENTANYL CITRATE (PF) 100 MCG/2ML IJ SOLN
INTRAMUSCULAR | Status: AC
  Filled 2017-08-16: qty 2

## 2017-08-16 MED ORDER — BUPIVACAINE HCL (PF) 0.5 % IJ SOLN
INTRAMUSCULAR | Status: DC | PRN
  Administered 2017-08-16: 10 mL

## 2017-08-16 MED ORDER — FENTANYL CITRATE (PF) 100 MCG/2ML IJ SOLN: 25.0000 ug | INTRAMUSCULAR | Status: DC | PRN

## 2017-08-16 SURGICAL SUPPLY — 43 items
APPLIER CLIP ROT 10 11.4 M/L (STAPLE) ×2
APR CLP MED LRG 11.4X10 (STAPLE) ×1
BAG RETRIEVAL 10 (BASKET) ×1
CHLORAPREP W/TINT 26ML (MISCELLANEOUS) ×2 IMPLANT
CLIP APPLIE ROT 10 11.4 M/L (STAPLE) ×1 IMPLANT
CLOTH BEACON ORANGE TIMEOUT ST (SAFETY) ×2 IMPLANT
COVER LIGHT HANDLE STERIS (MISCELLANEOUS) ×4 IMPLANT
DECANTER SPIKE VIAL GLASS SM (MISCELLANEOUS) ×2 IMPLANT
ELECT REM PT RETURN 9FT ADLT (ELECTROSURGICAL) ×2
ELECTRODE REM PT RTRN 9FT ADLT (ELECTROSURGICAL) ×1 IMPLANT
FILTER SMOKE EVAC LAPAROSHD (FILTER) ×2 IMPLANT
GLOVE BIOGEL PI IND STRL 7.0 (GLOVE) ×1 IMPLANT
GLOVE BIOGEL PI INDICATOR 7.0 (GLOVE) ×1
GLOVE ECLIPSE 7.0 STRL STRAW (GLOVE) ×4 IMPLANT
GLOVE SURG SS PI 6.5 STRL IVOR (GLOVE) ×2 IMPLANT
GLOVE SURG SS PI 7.5 STRL IVOR (GLOVE) ×2 IMPLANT
GOWN STRL REUS W/ TWL XL LVL3 (GOWN DISPOSABLE) ×1 IMPLANT
GOWN STRL REUS W/TWL LRG LVL3 (GOWN DISPOSABLE) ×4 IMPLANT
GOWN STRL REUS W/TWL XL LVL3 (GOWN DISPOSABLE) ×1
HEMOSTAT SNOW SURGICEL 2X4 (HEMOSTASIS) ×2 IMPLANT
INST SET LAPROSCOPIC AP (KITS) ×2 IMPLANT
IV NS IRRIG 3000ML ARTHROMATIC (IV SOLUTION) IMPLANT
KIT TURNOVER KIT A (KITS) ×2 IMPLANT
MANIFOLD NEPTUNE II (INSTRUMENTS) ×2 IMPLANT
NEEDLE INSUFFLATION 14GA 120MM (NEEDLE) ×2 IMPLANT
NS IRRIG 1000ML POUR BTL (IV SOLUTION) ×2 IMPLANT
PACK LAP CHOLE LZT030E (CUSTOM PROCEDURE TRAY) ×2 IMPLANT
PAD ARMBOARD 7.5X6 YLW CONV (MISCELLANEOUS) ×2 IMPLANT
SET BASIN LINEN APH (SET/KITS/TRAYS/PACK) ×2 IMPLANT
SET TUBE IRRIG SUCTION NO TIP (IRRIGATION / IRRIGATOR) IMPLANT
SLEEVE ENDOPATH XCEL 5M (ENDOMECHANICALS) ×2 IMPLANT
SPONGE GAUZE 2X2 8PLY STRL LF (GAUZE/BANDAGES/DRESSINGS) ×8 IMPLANT
STAPLER VISISTAT (STAPLE) ×2 IMPLANT
SUT VICRYL 0 UR6 27IN ABS (SUTURE) ×2 IMPLANT
SYS BAG RETRIEVAL 10MM (BASKET) ×1
SYSTEM BAG RETRIEVAL 10MM (BASKET) ×1 IMPLANT
TAPE CLOTH SURG 4X10 WHT LF (GAUZE/BANDAGES/DRESSINGS) ×2 IMPLANT
TROCAR ENDO BLADELESS 11MM (ENDOMECHANICALS) ×2 IMPLANT
TROCAR XCEL NON-BLD 5MMX100MML (ENDOMECHANICALS) ×2 IMPLANT
TROCAR XCEL UNIV SLVE 11M 100M (ENDOMECHANICALS) ×2 IMPLANT
TUBE CONNECTING 12X1/4 (SUCTIONS) ×2 IMPLANT
TUBING INSUFFLATION (TUBING) ×2 IMPLANT
WARMER LAPAROSCOPE (MISCELLANEOUS) ×2 IMPLANT

## 2017-08-16 NOTE — Interval H&P Note (Signed)
History and Physical Interval Note:  08/16/2017 7:13 AM  Kaitlin [name]  has presented today for surgery, with the diagnosis of cholelithiasis  The various methods of treatment have been discussed with the patient and family. After consideration of risks, benefits and other options for treatment, the patient has consented to  Procedure(s): LAPAROSCOPIC CHOLECYSTECTOMY (N/A) as a surgical intervention .  The patient's history has been reviewed, patient examined, no change in status, stable for surgery.  I have reviewed the patient's chart and labs.  Questions were answered to the patient's satisfaction.     Aviva Signs

## 2017-08-16 NOTE — Anesthesia Procedure Notes (Signed)
Procedure Name: Intubation Date/Time: 08/16/2017 7:39 AM Performed by: Charmaine Downs, CRNA Pre-anesthesia Checklist: Patient identified, Patient being monitored, Timeout performed, Emergency Drugs available and Suction available Patient Re-evaluated:Patient Re-evaluated prior to induction Oxygen Delivery Method: Circle System Utilized Preoxygenation: Pre-oxygenation with 100% oxygen Induction Type: IV induction Ventilation: Mask ventilation without difficulty Laryngoscope Size: Mac and 3 Grade View: Grade I Tube type: Oral Tube size: 7.0 mm Number of attempts: 1 Airway Equipment and Method: stylet Placement Confirmation: ETT inserted through vocal cords under direct vision,  positive ETCO2 and breath sounds checked- equal and bilateral Secured at: 22 cm Tube secured with: Tape Dental Injury: Teeth and Oropharynx as per pre-operative assessment

## 2017-08-16 NOTE — Anesthesia Postprocedure Evaluation (Signed)
Anesthesia Post Note  Patient: Kaitlin [name]  Procedure(s) Performed: LAPAROSCOPIC CHOLECYSTECTOMY (N/A )  Patient location during evaluation: PACU Anesthesia Type: General Level of consciousness: awake and patient cooperative Pain management: pain level controlled Vital Signs Assessment: post-procedure vital signs reviewed and stable Respiratory status: spontaneous breathing, nonlabored ventilation and respiratory function stable Cardiovascular status: blood pressure returned to baseline Postop Assessment: no apparent nausea or vomiting Anesthetic complications: no     Last Vitals:  Vitals:   08/16/17 0630  BP: (!) 91/59  Pulse: 95  Resp: (!) 22  Temp: 36.5 C  SpO2: 97%    Last Pain:  Vitals:   08/16/17 0630  TempSrc: Oral  PainSc: 0-No pain                 Shalanda Brogden J

## 2017-08-16 NOTE — Op Note (Signed)
Patient:  Kaitlin [name]  DOB:  1950-05-06  MRN:  [id number]   Preop Diagnosis: Biliary colic, cholelithiasis  Postop Diagnosis: Same  Procedure: Laparoscopic cholecystectomy  Surgeon: Aviva Signs, MD  Anes: General endotracheal  Indications: Patient is a 67 year old white female presents with biliary colic secondary to cholelithiasis.  The risks and benefits of the procedure including bleeding, infection, hepatobiliary injury, and the possibility of an open procedure were fully explained to the patient, who gave informed consent.  Procedure note: The patient was placed in supine position.  After induction of general endotracheal anesthesia, the abdomen was prepped and draped using the usual sterile technique with DuraPrep.  Surgical site confirmation was performed.  A supraumbilical incision was made down to the fascia.  A Veress needle was introduced into the abdominal cavity and confirmation of placement was done using the saline drop test.  The abdomen was then insufflated to 16 mmHg pressure.  An 11 mm trocar was introduced into the abdominal cavity under direct visualization without difficulty.  The patient was placed in reverse Trendelenburg position and an additional 11 mm trocar was placed in the epigastric region and 5 mm trochars were placed in the right upper quadrant and right flank regions.  Liver was inspected and noted to be somewhat darker in color.  No nodularity was noted.  The gallbladder was retracted in a dynamic fashion in order to provide a critical view of the triangle of Calot.  The cystic duct was first identified.  Its juncture to the infundibulum was fully identified.  Endoclips placed proximally and distally on the cystic duct, and the cystic duct was divided.  This was likewise done the cystic artery.  The gallbladder was freed away from the gallbladder fossa using Bovie electrocautery.  The gallbladder was delivered through the epigastric trocar site using an Endo  Catch bag.  The gallbladder fossa was inspected and no abnormal bleeding or bile leakage was noted.  Surgicel was placed in the gallbladder fossa.  All fluid and air were then evacuated from the abdominal cavity prior to the removal of the trochars.  All wounds were irrigated with normal saline.  All wounds were injected with 0.5% Sensorcaine.  The supraumbilical fascia was reapproximated using an 0 Vicryl interrupted suture.  All skin incisions were closed using staples.  Betadine ointment and dry sterile dressings were applied.  All tape and needle counts were correct at the end of the procedure.  The patient was extubated in the operating room and transferred to PACU in stable condition.  Complications: None  EBL: Minimal  Specimen: Gallbladder

## 2017-08-16 NOTE — Anesthesia Preprocedure Evaluation (Signed)
Anesthesia Evaluation  Patient identified by MRN, date of birth, ID band Patient awake    Reviewed: Allergy & Precautions, NPO status , Patient's Chart, lab work & pertinent test results  Airway Mallampati: I  TM Distance: >3 FB Neck ROM: Full    Dental no notable dental hx. (+) Edentulous Upper, Edentulous Lower   Pulmonary neg pulmonary ROS, COPD, Current Smoker,  Denies COPD or meds  C/w smoking    Pulmonary exam normal breath sounds clear to auscultation       Cardiovascular Exercise Tolerance: Good hypertension, Pt. on medications + CAD, + Past MI and + Peripheral Vascular Disease  negative cardio ROS Normal cardiovascular examI Rhythm:Regular Rate:Normal  3 stents remotely  Good ET H/o L CEA remotely - denies any new SX   Neuro/Psych negative neurological ROS  negative psych ROS   GI/Hepatic negative GI ROS, Neg liver ROS, GERD  Medicated and Controlled,  Endo/Other  negative endocrine ROS  Renal/GU Renal hypertensionRenal diseasenegative Renal ROS  negative genitourinary   Musculoskeletal negative musculoskeletal ROS (+) Arthritis ,   Abdominal   Peds negative pediatric ROS (+)  Hematology negative hematology ROS (+) anemia ,   Anesthesia Other Findings   Reproductive/Obstetrics negative OB ROS                             Anesthesia Physical Anesthesia Plan  ASA: III  Anesthesia Plan: General   Post-op Pain Management:    Induction: Intravenous  PONV Risk Score and Plan:   Airway Management Planned: Oral ETT  Additional Equipment:   Intra-op Plan:   Post-operative Plan: Extubation in OR  Informed Consent: I have reviewed the patients History and Physical, chart, labs and discussed the procedure including the risks, benefits and alternatives for the proposed anesthesia with the patient or authorized representative who has indicated his/her understanding and  acceptance.   Dental advisory given  Plan Discussed with: CRNA  Anesthesia Plan Comments:         Anesthesia Quick Evaluation

## 2017-08-16 NOTE — Transfer of Care (Signed)
Immediate Anesthesia Transfer of Care Note  Patient: Kaitlin [name]  Procedure(s) Performed: LAPAROSCOPIC CHOLECYSTECTOMY (N/A )  Patient Location: PACU  Anesthesia Type:General  Level of Consciousness: awake and patient cooperative  Airway & Oxygen Therapy: Patient Spontanous Breathing and Patient connected to face mask oxygen  Post-op Assessment: Report given to RN, Post -op Vital signs reviewed and stable and Patient moving all extremities  Post vital signs: Reviewed and stable  Last Vitals:  Vitals Value Taken Time  BP    Temp    Pulse 87 08/16/2017  8:21 AM  Resp    SpO2 95 % 08/16/2017  8:21 AM  Vitals shown include unvalidated device data.  Last Pain:  Vitals:   08/16/17 0630  TempSrc: Oral  PainSc: 0-No pain         Complications: No apparent anesthesia complications

## 2017-08-16 NOTE — Discharge Instructions (Signed)
Laparoscopic Cholecystectomy, Care After °This sheet gives you information about how to care for yourself after your procedure. Your health care provider may also give you more specific instructions. If you have problems or questions, contact your health care provider. °What can I expect after the procedure? °After the procedure, it is common to have: °· Pain at your incision sites. You will be given medicines to control this pain. °· Mild nausea or vomiting. °· Bloating and possible shoulder pain from the air-like gas that was used during the procedure. ° °Follow these instructions at home: °Incision care ° °· Follow instructions from your health care provider about how to take care of your incisions. Make sure you: °? Wash your hands with soap and water before you change your bandage (dressing). If soap and water are not available, use hand sanitizer. °? Change your dressing as told by your health care provider. °? Leave stitches (sutures), skin glue, or adhesive strips in place. These skin closures may need to be in place for 2 weeks or longer. If adhesive strip edges start to loosen and curl up, you may trim the loose edges. Do not remove adhesive strips completely unless your health care provider tells you to do that. °· Do not take baths, swim, or use a hot tub until your health care provider approves. Ask your health care provider if you can take showers. You may only be allowed to take sponge baths for bathing. °· Check your incision area every day for signs of infection. Check for: °? More redness, swelling, or pain. °? More fluid or blood. °? Warmth. °? Pus or a bad smell. °Activity °· Do not drive or use heavy machinery while taking prescription pain medicine. °· Do not lift anything that is heavier than 10 lb (4.5 kg) until your health care provider approves. °· Do not play contact sports until your health care provider approves. °· Do not drive for 24 hours if you were given a medicine to help you relax  (sedative). °· Rest as needed. Do not return to work or school until your health care provider approves. °General instructions °· Take over-the-counter and prescription medicines only as told by your health care provider. °· To prevent or treat constipation while you are taking prescription pain medicine, your health care provider may recommend that you: °? Drink enough fluid to keep your urine clear or pale yellow. °? Take over-the-counter or prescription medicines. °? Eat foods that are high in fiber, such as fresh fruits and vegetables, whole grains, and beans. °? Limit foods that are high in fat and processed sugars, such as fried and sweet foods. °Contact a health care provider if: °· You develop a rash. °· You have more redness, swelling, or pain around your incisions. °· You have more fluid or blood coming from your incisions. °· Your incisions feel warm to the touch. °· You have pus or a bad smell coming from your incisions. °· You have a fever. °· One or more of your incisions breaks open. °Get help right away if: °· You have trouble breathing. °· You have chest pain. °· You have increasing pain in your shoulders. °· You faint or feel dizzy when you stand. °· You have severe pain in your abdomen. °· You have nausea or vomiting that lasts for more than one day. °· You have leg pain. °This information is not intended to replace advice given to you by your health care provider. Make sure you discuss any questions you   have with your health care provider. °Document Released: 01/15/2005 Document Revised: 08/06/2015 Document Reviewed: 07/04/2015 °Elsevier Interactive Patient Education © 2018 Elsevier Inc. ° °

## 2017-08-19 ENCOUNTER — Encounter (HOSPITAL_COMMUNITY): Payer: Self-pay | Admitting: General Surgery

## 2017-08-20 NOTE — Addendum Note (Signed)
Addendum  created 08/20/17 0910 by Vista Deck, CRNA   Intraprocedure Flowsheets edited

## 2017-08-29 ENCOUNTER — Ambulatory Visit (INDEPENDENT_AMBULATORY_CARE_PROVIDER_SITE_OTHER): Payer: Self-pay | Admitting: General Surgery

## 2017-08-29 ENCOUNTER — Encounter: Payer: Self-pay | Admitting: General Surgery

## 2017-08-29 VITALS — BP 134/65 | HR 95 | Temp 97.1°F | Resp 20 | Wt 129.0 lb

## 2017-08-29 DIAGNOSIS — Z09 Encounter for follow-up examination after completed treatment for conditions other than malignant neoplasm: Secondary | ICD-10-CM

## 2017-08-29 NOTE — Progress Notes (Signed)
Subjective:     Kaitlin [name]  Status post laparoscopic cholecystectomy.  Doing well.  Has no complaints. Objective:    BP 134/65 (BP Location: Left Arm, Patient Position: Sitting, Cuff Size: Normal)   Pulse 95   Temp (!) 97.1 F (36.2 C) (Temporal)   Resp 20   Wt 129 lb (58.5 kg)   BMI 24.37 kg/m   General:  alert, cooperative and no distress  Abdomen soft, incisions healing well.  Staples removed. Final pathology consistent with diagnosis.     Assessment:    Doing well postoperatively.    Plan:   May return to normal activity.  Follow-up here as needed.

## 2017-09-03 DIAGNOSIS — F17200 Nicotine dependence, unspecified, uncomplicated: Secondary | ICD-10-CM | POA: Diagnosis not present

## 2017-09-03 DIAGNOSIS — E559 Vitamin D deficiency, unspecified: Secondary | ICD-10-CM | POA: Diagnosis not present

## 2017-09-03 DIAGNOSIS — E785 Hyperlipidemia, unspecified: Secondary | ICD-10-CM | POA: Diagnosis not present

## 2017-10-04 ENCOUNTER — Ambulatory Visit (INDEPENDENT_AMBULATORY_CARE_PROVIDER_SITE_OTHER): Payer: Medicare Other | Admitting: Cardiology

## 2017-10-04 ENCOUNTER — Encounter: Payer: Self-pay | Admitting: Cardiology

## 2017-10-04 VITALS — BP 100/54 | HR 86 | Ht 61.0 in | Wt 130.0 lb

## 2017-10-04 DIAGNOSIS — E782 Mixed hyperlipidemia: Secondary | ICD-10-CM

## 2017-10-04 DIAGNOSIS — I6523 Occlusion and stenosis of bilateral carotid arteries: Secondary | ICD-10-CM | POA: Diagnosis not present

## 2017-10-04 DIAGNOSIS — I10 Essential (primary) hypertension: Secondary | ICD-10-CM

## 2017-10-04 DIAGNOSIS — I2510 Atherosclerotic heart disease of native coronary artery without angina pectoris: Secondary | ICD-10-CM | POA: Diagnosis not present

## 2017-10-04 NOTE — Progress Notes (Signed)
Clinical Summary [name] is a 67 y.o.female seen today for follow up of the following medical problems.   1. CAD  - w/ DES to LAD and BMSx2 to RCA Jan 2007, reported normal LV function from notes.  - the RCA PCI was complicated by dissection which was also stented   - was temporaily off ASA and plavix due to severe anemia requiring transfusion. Extensive GI workup without clear source of bleeding - back on ASA and plavix now.   - mild occasional chest pain at times, chronic stable.  - no recent SOB/DOE, some fatigue when out in the sun - compliant with meds   2. PAD  - 60-70% right external iliac artery stenosis by previous cath  - no recent claudication.   -no recent claudication   3. Carotid stenosis  - prior right carotid endarterectomy   - 09/5282 carotid US: LICA 13-24%,MWNU <27%. - no recent neuro symptoms.   4. HTN  - she is compliant with meds   5. Hyperlipidemia  - compliant with statin, labs followed by pcp  6. AAA 07/2014 small AAA 3 x 3.2 cm.  - 12/2016 3.2 cm and stable.    7. Anemia - previously severe as low as 6.7, required transfusion - followed by hematology. From notes thought to be related to CKD - GI workup with EGD showed chronic gastritis, colonscopy no clear bleeding source.    8. Gallstones - recent lap chole    SH: just retired from [employer]. Spends most of time with great grandchildren (9 months, 2.5 years)   Past Medical History:  Diagnosis Date  . Arthritis   . CAD (coronary artery disease)    stents  . COPD (chronic obstructive pulmonary disease) ([place])   . Emphysema of lung ([place])   . GERD (gastroesophageal reflux disease)   . Hyperlipidemia   . Hypertension   . Iliac artery stenosis, right (HCC)    60%-70%  . Myocardial infarction ([place])   . Normocytic anemia 01/26/2017  . PAD (peripheral artery disease) ([place]) 12/14/2010   in the left vertebral, bilateral carotids     No  Known Allergies   Current Outpatient Medications  Medication Sig Dispense Refill  . aspirin 81 MG chewable tablet Chew 81 mg by mouth daily.    . cholecalciferol (VITAMIN D) 1000 units tablet Take 1,000 Units by mouth daily.    . clopidogrel (PLAVIX) 75 MG tablet Take 75 mg by mouth daily.    . Cyanocobalamin (VITAMIN B 12 PO) Take 1 tablet by mouth daily.     . ferrous sulfate 325 (65 FE) MG tablet Take 325 mg by mouth daily with breakfast.    . lisinopril (PRINIVIL,ZESTRIL) 5 MG tablet Take 2.5 mg by mouth daily.    Marland Kitchen NITROSTAT 0.4 MG SL tablet PLACE 1 TABLET UNDER TONGUE EVERY 5 MINUTES FOR 3 DOSES AS NEEDED. 25 tablet 3  . pantoprazole (PROTONIX) 40 MG tablet TAKE ONE TABLET (40MG  TOTAL) BY MOUTH TWICE DAILY BEFORE A MEAL 60 tablet 5  . rosuvastatin (CRESTOR) 40 MG tablet TAKE ONE (1) TABLET BY MOUTH EVERY DAY (Patient taking differently: TAKE ONE TABLET(40mg ) BY MOUTH EVERY DAY) 90 tablet 2   No current facility-administered medications for this visit.      Past Surgical History:  Procedure Laterality Date  . CARDIAC CATHETERIZATION  02/04/2005   A cypher 5.3 x 18 mm at 16 atmosphere of pressure in the mid LAD, this stent covered the proximal part of  the LAD and also the mid LAD, this was then post dilated with 3.25 x 15 mm Quantum at 16 atmosphereof pressure for 45 seconds  . CATARACT EXTRACTION Bilateral   . CHOLECYSTECTOMY N/A 08/16/2017   Procedure: LAPAROSCOPIC CHOLECYSTECTOMY;  Surgeon: Aviva Signs, MD;  Location: [facility];  Service: General;  Laterality: N/A;  . COLONOSCOPY N/A 03/01/2017   Procedure: COLONOSCOPY;  Surgeon: Danie Binder, MD;  Location: [facility];  Service: Endoscopy;  Laterality: N/A;  1:00pm  . ESOPHAGOGASTRODUODENOSCOPY N/A 01/28/2017   Procedure: ESOPHAGOGASTRODUODENOSCOPY (EGD);  Surgeon: Jerene Bears, MD;  Location: [facility] ENDOSCOPY;  Service: Gastroenterology;  Laterality: N/A;  . GIVENS CAPSULE STUDY N/A 03/12/2017   Procedure: GIVENS CAPSULE STUDY;   Surgeon: Danie Binder, MD;  Location: [facility];  Service: Endoscopy;  Laterality: N/A;  7:30am  . TUBAL LIGATION       No Known Allergies    Family History  Problem Relation Age of Onset  . Heart attack Mother   . Hyperlipidemia Mother   . Hypertension Mother   . Diabetes Mother   . Heart attack Father   . Early death Father 24  . Hyperlipidemia Father   . Hypertension Father   . Heart disease Sister   . Hyperlipidemia Sister   . Hypertension Sister   . Diabetes Brother   . Heart disease Sister   . Hyperlipidemia Sister   . Hypertension Sister   . Heart attack Sister   . Heart disease Sister   . Hyperlipidemia Sister   . Hypertension Sister   . Heart attack Brother   . Early death Brother 39  . Hyperlipidemia Brother   . Hypertension Brother   . Heart attack Brother   . Early death Brother 1  . Hyperlipidemia Brother   . Hypertension Brother   . Cancer Maternal Aunt   . Colon cancer Neg Hx   . Colon polyps Neg Hx      Social History [name] reports that she has been smoking cigarettes. She started smoking about 50 years ago. She has a 49.00 pack-year smoking history. She has never used smokeless tobacco. [name] reports that she does not drink alcohol.   Review of Systems CONSTITUTIONAL: No weight loss, fever, chills, weakness or fatigue.  HEENT: Eyes: No visual loss, blurred vision, double vision or yellow sclerae.No hearing loss, sneezing, congestion, runny nose or sore throat.  SKIN: No rash or itching.  CARDIOVASCULAR: per hpi RESPIRATORY: No shortness of breath, cough or sputum.  GASTROINTESTINAL: No anorexia, nausea, vomiting or diarrhea. No abdominal pain or blood.  GENITOURINARY: No burning on urination, no polyuria NEUROLOGICAL: No headache, dizziness, syncope, paralysis, ataxia, numbness or tingling in the extremities. No change in bowel or bladder control.  MUSCULOSKELETAL: No muscle, back pain, joint pain or stiffness.    LYMPHATICS: No enlarged nodes. No history of splenectomy.  PSYCHIATRIC: No history of depression or anxiety.  ENDOCRINOLOGIC: No reports of sweating, cold or heat intolerance. No polyuria or polydipsia.  Marland Kitchen   Physical Examination Vitals:   10/04/17 0952  BP: (!) 100/54  Pulse: 86  SpO2: 97%   Vitals:   10/04/17 0952  Weight: 130 lb (59 kg)  Height: 5\' 1"  (1.549 m)    Gen: resting comfortably, no acute distress HEENT: no scleral icterus, pupils equal round and reactive, no palptable cervical adenopathy,  CV: RRR, no m/r/g, no jvd Resp: Clear to auscultation bilaterally GI: abdomen is soft, non-tender, non-distended, normal bowel sounds, no hepatosplenomegaly  MSK: extremities are warm, no edema.  Skin: warm, no rash Neuro:  no focal deficits Psych: appropriate affect   Diagnostic Studies 07/2014 Carotid US - 40-59% bilateral diseae, right CEA is patent.   07/2014 AAA [place] 3 x 3.2 aneurysm  05/2016 Carotid US IMPRESSION: Less than 50% stenosis in the right internal carotid artery.  50-69% stenosis in the left internal carotid artery.    Assessment and Plan  1. CAD -  Given her history of complex RCA PCI as well as carotid and PAD would favor continuing DAPT - no recent symptoms, continue current meds  2. HTN - controlled, continue current meds  3. Hyperlipidemia -she will continue statin, request labs from pcp  4. Carotid stenosis - repeat carotid US - continue medical therapy - no recent symptoms  F/u 6 months      Arnoldo Lenis, M.D., F.A.C.C.

## 2017-10-04 NOTE — Patient Instructions (Signed)
Medication Instructions:  Your physician recommends that you continue on your current medications as directed. Please refer to the Current Medication list given to you today.   Labwork: NONE  Testing/Procedures: Your physician has requested that you have a carotid duplex. This test is an ultrasound of the carotid arteries in your neck. It looks at blood flow through these arteries that supply the brain with blood. Allow one hour for this exam. There are no restrictions or special instructions.    Follow-Up: Your physician wants you to follow-up in: 6 MONTHS.  You will receive a reminder letter in the mail two months in advance. If you don't receive a letter, please call our office to schedule the follow-up appointment.   Any Other Special Instructions Will Be Listed Below (If Applicable).     If you need a refill on your cardiac medications before your next appointment, please call your pharmacy.

## 2017-10-09 ENCOUNTER — Ambulatory Visit (HOSPITAL_COMMUNITY)
Admission: RE | Admit: 2017-10-09 | Discharge: 2017-10-09 | Disposition: A | Discharge status: Home / Self Care | Payer: Medicare Other | Source: Ambulatory Visit | Attending: Cardiology | Admitting: Cardiology

## 2017-10-09 DIAGNOSIS — I6523 Occlusion and stenosis of bilateral carotid arteries: Secondary | ICD-10-CM | POA: Insufficient documentation

## 2017-10-09 DIAGNOSIS — I6522 Occlusion and stenosis of left carotid artery: Secondary | ICD-10-CM | POA: Diagnosis not present

## 2017-10-11 DIAGNOSIS — D509 Iron deficiency anemia, unspecified: Secondary | ICD-10-CM | POA: Diagnosis not present

## 2017-10-11 DIAGNOSIS — R809 Proteinuria, unspecified: Secondary | ICD-10-CM | POA: Diagnosis not present

## 2017-10-11 DIAGNOSIS — N183 Chronic kidney disease, stage 3 (moderate): Secondary | ICD-10-CM | POA: Diagnosis not present

## 2017-10-11 DIAGNOSIS — I10 Essential (primary) hypertension: Secondary | ICD-10-CM | POA: Diagnosis not present

## 2017-10-11 DIAGNOSIS — Z79899 Other long term (current) drug therapy: Secondary | ICD-10-CM | POA: Diagnosis not present

## 2017-10-11 DIAGNOSIS — E559 Vitamin D deficiency, unspecified: Secondary | ICD-10-CM | POA: Diagnosis not present

## 2017-10-16 DIAGNOSIS — N183 Chronic kidney disease, stage 3 (moderate): Secondary | ICD-10-CM | POA: Diagnosis not present

## 2017-10-16 DIAGNOSIS — N2581 Secondary hyperparathyroidism of renal origin: Secondary | ICD-10-CM | POA: Diagnosis not present

## 2017-10-16 DIAGNOSIS — E559 Vitamin D deficiency, unspecified: Secondary | ICD-10-CM | POA: Diagnosis not present

## 2017-10-16 DIAGNOSIS — R809 Proteinuria, unspecified: Secondary | ICD-10-CM | POA: Diagnosis not present

## 2017-11-06 ENCOUNTER — Inpatient Hospital Stay (HOSPITAL_COMMUNITY): Payer: Medicare Other | Attending: Hematology

## 2017-11-06 DIAGNOSIS — D509 Iron deficiency anemia, unspecified: Secondary | ICD-10-CM | POA: Diagnosis not present

## 2017-11-06 DIAGNOSIS — Z955 Presence of coronary angioplasty implant and graft: Secondary | ICD-10-CM | POA: Insufficient documentation

## 2017-11-06 DIAGNOSIS — E538 Deficiency of other specified B group vitamins: Secondary | ICD-10-CM | POA: Diagnosis not present

## 2017-11-06 DIAGNOSIS — F17210 Nicotine dependence, cigarettes, uncomplicated: Secondary | ICD-10-CM | POA: Diagnosis not present

## 2017-11-06 DIAGNOSIS — I2510 Atherosclerotic heart disease of native coronary artery without angina pectoris: Secondary | ICD-10-CM | POA: Insufficient documentation

## 2017-11-06 DIAGNOSIS — Z79899 Other long term (current) drug therapy: Secondary | ICD-10-CM | POA: Diagnosis not present

## 2017-11-06 DIAGNOSIS — I10 Essential (primary) hypertension: Secondary | ICD-10-CM | POA: Insufficient documentation

## 2017-11-06 DIAGNOSIS — K5900 Constipation, unspecified: Secondary | ICD-10-CM | POA: Diagnosis not present

## 2017-11-06 DIAGNOSIS — D649 Anemia, unspecified: Secondary | ICD-10-CM

## 2017-11-06 DIAGNOSIS — Z7902 Long term (current) use of antithrombotics/antiplatelets: Secondary | ICD-10-CM | POA: Insufficient documentation

## 2017-11-06 DIAGNOSIS — Z7982 Long term (current) use of aspirin: Secondary | ICD-10-CM | POA: Insufficient documentation

## 2017-11-06 DIAGNOSIS — Z23 Encounter for immunization: Secondary | ICD-10-CM | POA: Diagnosis not present

## 2017-11-06 LAB — FERRITIN: FERRITIN: 45 ng/mL (ref 11–307)

## 2017-11-06 LAB — COMPREHENSIVE METABOLIC PANEL
ALBUMIN: 4.1 g/dL (ref 3.5–5.0)
ALK PHOS: 95 U/L (ref 38–126)
ALT: 12 U/L (ref 0–44)
ANION GAP: 8 (ref 5–15)
AST: 17 U/L (ref 15–41)
BILIRUBIN TOTAL: 0.4 mg/dL (ref 0.3–1.2)
BUN: 17 mg/dL (ref 8–23)
CO2: 27 mmol/L (ref 22–32)
Calcium: 9.1 mg/dL (ref 8.9–10.3)
Chloride: 106 mmol/L (ref 98–111)
Creatinine, Ser: 1.4 mg/dL — ABNORMAL HIGH (ref 0.44–1.00)
GFR calc Af Amer: 44 mL/min — ABNORMAL LOW (ref >60)
GFR, EST NON AFRICAN AMERICAN: 38 mL/min — AB (ref >60)
GLUCOSE: 124 mg/dL — AB (ref 70–99)
Potassium: 4 mmol/L (ref 3.5–5.1)
Sodium: 141 mmol/L (ref 135–145)
TOTAL PROTEIN: 7.2 g/dL (ref 6.5–8.1)

## 2017-11-06 LAB — CBC WITH DIFFERENTIAL/PLATELET
Abs Immature Granulocytes: 0 10*3/uL (ref 0.00–0.07)
BASOS ABS: 0 10*3/uL (ref 0.0–0.1)
Basophils Relative: 1 %
EOS ABS: 0.2 10*3/uL (ref 0.0–0.5)
Eosinophils Relative: 5 %
HEMATOCRIT: 36.6 % (ref 36.0–46.0)
HEMOGLOBIN: 11.6 g/dL — AB (ref 12.0–15.0)
Immature Granulocytes: 0 %
LYMPHS ABS: 1.4 10*3/uL (ref 0.7–4.0)
LYMPHS PCT: 37 %
MCH: 32.6 pg (ref 26.0–34.0)
MCHC: 31.7 g/dL (ref 30.0–36.0)
MCV: 102.8 fL — AB (ref 80.0–100.0)
MONO ABS: 0.2 10*3/uL (ref 0.1–1.0)
MONOS PCT: 6 %
NRBC: 0 % (ref 0.0–0.2)
Neutro Abs: 2 10*3/uL (ref 1.7–7.7)
Neutrophils Relative %: 51 %
Platelets: 173 10*3/uL (ref 150–400)
RBC: 3.56 MIL/uL — ABNORMAL LOW (ref 3.87–5.11)
RDW: 13.1 % (ref 11.5–15.5)
WBC: 3.8 10*3/uL — ABNORMAL LOW (ref 4.0–10.5)

## 2017-11-06 LAB — IRON AND TIBC
IRON: 67 ug/dL (ref 28–170)
Saturation Ratios: 20 % (ref 10.4–31.8)
TIBC: 337 ug/dL (ref 250–450)
UIBC: 270 ug/dL

## 2017-11-06 LAB — FOLATE: Folate: 12.8 ng/mL (ref >5.9)

## 2017-11-06 LAB — VITAMIN B12: Vitamin B-12: 593 pg/mL (ref 180–914)

## 2017-11-11 ENCOUNTER — Ambulatory Visit (HOSPITAL_COMMUNITY): Payer: Medicare Other | Admitting: Hematology

## 2017-11-11 ENCOUNTER — Inpatient Hospital Stay (HOSPITAL_BASED_OUTPATIENT_CLINIC_OR_DEPARTMENT_OTHER): Payer: Medicare Other | Admitting: Hematology

## 2017-11-11 ENCOUNTER — Other Ambulatory Visit: Payer: Self-pay

## 2017-11-11 ENCOUNTER — Encounter (HOSPITAL_COMMUNITY): Payer: Self-pay | Admitting: Hematology

## 2017-11-11 VITALS — BP 122/65 | HR 94 | Temp 97.5°F | Resp 16 | Wt 131.3 lb

## 2017-11-11 DIAGNOSIS — Z955 Presence of coronary angioplasty implant and graft: Secondary | ICD-10-CM

## 2017-11-11 DIAGNOSIS — Z7902 Long term (current) use of antithrombotics/antiplatelets: Secondary | ICD-10-CM

## 2017-11-11 DIAGNOSIS — Z79899 Other long term (current) drug therapy: Secondary | ICD-10-CM

## 2017-11-11 DIAGNOSIS — D649 Anemia, unspecified: Secondary | ICD-10-CM

## 2017-11-11 DIAGNOSIS — E538 Deficiency of other specified B group vitamins: Secondary | ICD-10-CM

## 2017-11-11 DIAGNOSIS — F17210 Nicotine dependence, cigarettes, uncomplicated: Secondary | ICD-10-CM | POA: Diagnosis not present

## 2017-11-11 DIAGNOSIS — Z7982 Long term (current) use of aspirin: Secondary | ICD-10-CM

## 2017-11-11 DIAGNOSIS — K5900 Constipation, unspecified: Secondary | ICD-10-CM

## 2017-11-11 DIAGNOSIS — D509 Iron deficiency anemia, unspecified: Secondary | ICD-10-CM

## 2017-11-11 DIAGNOSIS — I10 Essential (primary) hypertension: Secondary | ICD-10-CM | POA: Diagnosis not present

## 2017-11-11 DIAGNOSIS — I2510 Atherosclerotic heart disease of native coronary artery without angina pectoris: Secondary | ICD-10-CM

## 2017-11-11 NOTE — Progress Notes (Signed)
[address] [place], [place] [address]   CLINIC:  Medical Oncology/Hematology  PCP:  Doree Albee, MD [place] [address] [phone]   REASON FOR VISIT: Follow-up for normocytic anemia, B12 and iron deficiency  CURRENT THERAPY: intermittent iron infusions and B12    INTERVAL HISTORY:  Kaitlin [name] 67 y.o. female returns for routine follow-up for iron deficiency anemia and B12 deficiency. She is here today and reports doing well. Her appetite is 50% however she is maintaining her weight. Her energy level is 75%. She does have occasional constipation from the oral iron. She also reports easy bruising. She denies any bleeding or dark stools. Denies any new pains. Denies any headaches or shortness of breath.   REVIEW OF SYSTEMS:  Review of Systems  Gastrointestinal: Positive for constipation and nausea.  Neurological: Positive for numbness (hands).  Hematological: Bruises/bleeds easily.  All other systems reviewed and are negative.    PAST MEDICAL/SURGICAL HISTORY:  Past Medical History:  Diagnosis Date  . Arthritis   . CAD (coronary artery disease)    stents  . COPD (chronic obstructive pulmonary disease) ([place])   . Emphysema of lung ([place])   . GERD (gastroesophageal reflux disease)   . Hyperlipidemia   . Hypertension   . Iliac artery stenosis, right (HCC)    60%-70%  . Myocardial infarction ([place])   . Normocytic anemia 01/26/2017  . PAD (peripheral artery disease) ([place]) 12/14/2010   in the left vertebral, bilateral carotids   Past Surgical History:  Procedure Laterality Date  . CARDIAC CATHETERIZATION  02/04/2005   A cypher 5.3 x 18 mm at 16 atmosphere of pressure in the mid LAD, this stent covered the proximal part of the LAD and also the mid LAD, this was then post dilated with 3.25 x 15 mm Quantum at 16 atmosphereof pressure for 45 seconds  . CATARACT EXTRACTION Bilateral   . CHOLECYSTECTOMY N/A 08/16/2017   Procedure: LAPAROSCOPIC CHOLECYSTECTOMY;  Surgeon: Aviva Signs, MD;  Location: [facility];  Service: General;  Laterality: N/A;  . COLONOSCOPY N/A 03/01/2017   Procedure: COLONOSCOPY;  Surgeon: Danie Binder, MD;  Location[facility];  Service: Endoscopy;  Laterality: N/A;  1:00pm  . ESOPHAGOGASTRODUODENOSCOPY N/A 01/28/2017   Procedure: ESOPHAGOGASTRODUODENOSCOPY (EGD);  Surgeon: Jerene Bears, MD;  Location: [facility] ENDOSCOPY;  Service: Gastroenterology;  Laterality: N/A;  . GIVENS CAPSULE STUDY N/A 03/12/2017   Procedure: GIVENS CAPSULE STUDY;  Surgeon: Danie Binder, MD;  [facility];  Service: Endoscopy;  Laterality: N/A;  7:30am  . TUBAL LIGATION       SOCIAL HISTORY:  Social History   Socioeconomic History  . Marital status: Married    Spouse name: Kaitlin [name]  . Number of children: 3  . Years of education: 95  . Highest education level: Not on file  Occupational History  . Occupation: RETIRED    Comment: warehouse/textiles  Social Needs  . Financial resource strain: Not hard at all  . Food insecurity:    Worry: Never true    Inability: Never true  . Transportation needs:    Medical: No    Non-medical: No  Tobacco Use  . Smoking status: Current Every Day Smoker    Packs/day: 1.00    Years: 49.00    Pack years: 49.00    Types: Cigarettes    Start date: 02/09/1967  . Smokeless tobacco: Never Used  Substance and Sexual Activity  . Alcohol use: No  .  Drug use: No  . Sexual activity: Yes    Birth control/protection: Post-menopausal  Lifestyle  . Physical activity:    Days per week: 5 days    Minutes per session: 20 min  . Stress: Only a little  Relationships  . Social connections:    Talks on phone: More than three times a week    Gets together: More than three times a week    Attends religious service: Never    Active member of club or organization: No    Attends meetings of clubs or organizations: Never    Relationship status: Married  . Intimate  partner violence:    Fear of current or ex partner: No    Emotionally abused: No    Physically abused: No    Forced sexual activity: No  Other Topics Concern  . Not on file  Social History Narrative   Retired   Has an [place] in [employer] with husband Kaitlin [name] for 23 years   Stays busy with home, gardens, likes to can    FAMILY HISTORY:  Family History  Problem Relation Age of Onset  . Heart attack Mother   . Hyperlipidemia Mother   . Hypertension Mother   . Diabetes Mother   . Heart attack Father   . Early death Father 67  . Hyperlipidemia Father   . Hypertension Father   . Heart disease Sister   . Hyperlipidemia Sister   . Hypertension Sister   . Diabetes Brother   . Heart disease Sister   . Hyperlipidemia Sister   . Hypertension Sister   . Heart attack Sister   . Heart disease Sister   . Hyperlipidemia Sister   . Hypertension Sister   . Heart attack Brother   . Early death Brother 81  . Hyperlipidemia Brother   . Hypertension Brother   . Heart attack Brother   . Early death Brother 80  . Hyperlipidemia Brother   . Hypertension Brother   . Cancer Maternal Aunt   . Colon cancer Neg Hx   . Colon polyps Neg Hx     CURRENT MEDICATIONS:  Outpatient Encounter Medications as of 11/11/2017  Medication Sig  . aspirin 81 MG chewable tablet Chew 81 mg by mouth daily.  . cholecalciferol (VITAMIN D) 1000 units tablet Take 1,000 Units by mouth daily.  . clopidogrel (PLAVIX) 75 MG tablet Take 75 mg by mouth daily.  . Cyanocobalamin (VITAMIN B 12 PO) Take 1 tablet by mouth daily.   . ferrous sulfate 325 (65 FE) MG tablet Take 325 mg by mouth daily with breakfast.  . lisinopril (PRINIVIL,ZESTRIL) 5 MG tablet Take 2.5 mg by mouth daily.  . pantoprazole (PROTONIX) 40 MG tablet TAKE ONE TABLET (40MG  TOTAL) BY MOUTH TWICE DAILY BEFORE A MEAL  . rosuvastatin (CRESTOR) 40 MG tablet TAKE ONE (1) TABLET BY MOUTH EVERY DAY (Patient taking differently: TAKE ONE TABLET(40mg ) BY MOUTH  EVERY DAY)  . NITROSTAT 0.4 MG SL tablet PLACE 1 TABLET UNDER TONGUE EVERY 5 MINUTES FOR 3 DOSES AS NEEDED. (Patient not taking: Reported on 11/11/2017)   No facility-administered encounter medications on file as of 11/11/2017.     ALLERGIES:  No Known Allergies   PHYSICAL EXAM:  ECOG Performance status: 1  Vitals:   11/11/17 1134  BP: 122/65  Pulse: 94  Resp: 16  Temp: (!) 97.5 F (36.4 C)  SpO2: 100%   Filed Weights   11/11/17 1134  Weight: 131 lb 4.8 oz (59.6  kg)    Physical Exam  Constitutional: She is oriented to person, place, and time. She appears well-developed and well-nourished.  Musculoskeletal: Normal range of motion.  Neurological: She is alert and oriented to person, place, and time.  Skin: Skin is warm and dry.  Psychiatric: She has a normal mood and affect. Her behavior is normal. Judgment and thought content normal.  Abdominal: non tender, no distention, soft no pain   LABORATORY DATA:  I have reviewed the labs as listed.  CBC    Component Value Date/Time   WBC 3.8 (L) 11/06/2017 1056   RBC 3.56 (L) 11/06/2017 1056   HGB 11.6 (L) 11/06/2017 1056   HCT 36.6 11/06/2017 1056   PLT 173 11/06/2017 1056   MCV 102.8 (H) 11/06/2017 1056   MCH 32.6 11/06/2017 1056   MCHC 31.7 11/06/2017 1056   RDW 13.1 11/06/2017 1056   LYMPHSABS 1.4 11/06/2017 1056   MONOABS 0.2 11/06/2017 1056   EOSABS 0.2 11/06/2017 1056   BASOSABS 0.0 11/06/2017 1056   CMP Latest Ref Rng & Units 11/06/2017 08/14/2017 04/19/2017  Glucose 70 - 99 mg/dL 124(H) 104(H) 85  BUN 8 - 23 mg/dL 17 20 21(H)  Creatinine 0.44 - 1.00 mg/dL 1.40(H) 1.73(H) 1.65(H)  Sodium 135 - 145 mmol/L 141 138 140  Potassium 3.5 - 5.1 mmol/L 4.0 3.9 3.7  Chloride 98 - 111 mmol/L 106 106 106  CO2 22 - 32 mmol/L 27 23 23   Calcium 8.9 - 10.3 mg/dL 9.1 9.4 8.9  Total Protein 6.5 - 8.1 g/dL 7.2 7.2 7.6  Total Bilirubin 0.3 - 1.2 mg/dL 0.4 0.5 0.3  Alkaline Phos 38 - 126 U/L 95 101 113  AST 15 - 41 U/L 17 19  19   ALT 0 - 44 U/L 12 13 13(L)         ASSESSMENT & PLAN:   Normocytic anemia 1.  Normocytic anemia: Combination anemia from chronic kidney disease, will do iron deficiency and B12 deficiency.  -EGD on 01/28/2017 showed chronic gastritis, colonoscopy on 03/01/2017 showed tubular adenoma and external hemorrhoids with no clear source of bleeding. - I have reviewed her labs today.  Hemoglobin is stable at 11.6.  Hematocrit was 36.  She developed slight macrocytosis with MCV of 102.8. -She is tolerating iron tablet once daily very well.  She is also taking B12 tablet daily.  She rarely gets constipated. - Her ferritin was 45 and percent saturation was 20.  If there is any drop in hemoglobin, will consider parenteral iron therapy.  2.  CAD with stents: She is taking aspirin and Plavix daily.  Denies any bleeding per rectum or melena.  3.  Hypertension: She will continue lisinopril daily.  This is well controlled.      Orders placed this encounter:  Orders Placed This Encounter  Procedures  . CBC with Differential/Platelet  . Comprehensive metabolic panel  . Ferritin  . Iron and TIBC  . Vitamin B12  . Folate  . TSH      Derek Jack, [place] [phone]

## 2017-11-11 NOTE — Patient Instructions (Signed)
[place] at [facility] Discharge Instructions  Follow up in 4 months with labs prior to your visit.    Thank you for choosing [place] at [facility] to provide your oncology and hematology care.  To afford each patient quality time with our provider, please arrive at least 15 minutes before your scheduled appointment time.   If you have a lab appointment with the [facility] please come in thru the  Main Entrance and check in at the main information desk  You need to re-schedule your appointment should you arrive 10 or more minutes late.  We strive to give you quality time with our providers, and arriving late affects you and other patients whose appointments are after yours.  Also, if you no show three or more times for appointments you may be dismissed from the clinic at the providers discretion.     Again, thank you for choosing [facility].  Our hope is that these requests will decrease the amount of time that you wait before being seen by our physicians.       _____________________________________________________________  Should you have questions after your visit to [facility], please contact our office at [phone] between the hours of 8:00 a.m. and 4:30 p.m.  Voicemails left after 4:00 p.m. will not be returned until the following business day.  For prescription refill requests, have your pharmacy contact our office and allow 72 hours.    Cancer Center Support Programs:   > Cancer Support Group  2nd Tuesday of the month 1pm-2pm, Journey Room

## 2017-11-11 NOTE — Assessment & Plan Note (Signed)
1.  Normocytic anemia: Combination anemia from chronic kidney disease, will do iron deficiency and B12 deficiency.  -EGD on 01/28/2017 showed chronic gastritis, colonoscopy on 03/01/2017 showed tubular adenoma and external hemorrhoids with no clear source of bleeding. - I have reviewed her labs today.  Hemoglobin is stable at 11.6.  Hematocrit was 36.  She developed slight macrocytosis with MCV of 102.8. -She is tolerating iron tablet once daily very well.  She is also taking B12 tablet daily.  She rarely gets constipated. - Her ferritin was 45 and percent saturation was 20.  If there is any drop in hemoglobin, will consider parenteral iron therapy.  2.  CAD with stents: She is taking aspirin and Plavix daily.  Denies any bleeding per rectum or melena.  3.  Hypertension: She will continue lisinopril daily.  This is well controlled.

## 2017-11-21 ENCOUNTER — Encounter: Payer: Self-pay | Admitting: Gastroenterology

## 2017-11-22 ENCOUNTER — Other Ambulatory Visit: Payer: Self-pay | Admitting: Cardiology

## 2018-02-08 ENCOUNTER — Other Ambulatory Visit: Payer: Self-pay | Admitting: Cardiology

## 2018-03-07 ENCOUNTER — Inpatient Hospital Stay (HOSPITAL_COMMUNITY): Payer: Medicare Other | Attending: Hematology

## 2018-03-07 DIAGNOSIS — E785 Hyperlipidemia, unspecified: Secondary | ICD-10-CM | POA: Diagnosis not present

## 2018-03-07 DIAGNOSIS — N189 Chronic kidney disease, unspecified: Secondary | ICD-10-CM | POA: Diagnosis not present

## 2018-03-07 DIAGNOSIS — Z955 Presence of coronary angioplasty implant and graft: Secondary | ICD-10-CM | POA: Diagnosis not present

## 2018-03-07 DIAGNOSIS — E538 Deficiency of other specified B group vitamins: Secondary | ICD-10-CM | POA: Insufficient documentation

## 2018-03-07 DIAGNOSIS — Z7982 Long term (current) use of aspirin: Secondary | ICD-10-CM | POA: Insufficient documentation

## 2018-03-07 DIAGNOSIS — I252 Old myocardial infarction: Secondary | ICD-10-CM | POA: Diagnosis not present

## 2018-03-07 DIAGNOSIS — M1990 Unspecified osteoarthritis, unspecified site: Secondary | ICD-10-CM | POA: Insufficient documentation

## 2018-03-07 DIAGNOSIS — K219 Gastro-esophageal reflux disease without esophagitis: Secondary | ICD-10-CM | POA: Insufficient documentation

## 2018-03-07 DIAGNOSIS — K5900 Constipation, unspecified: Secondary | ICD-10-CM | POA: Diagnosis not present

## 2018-03-07 DIAGNOSIS — J449 Chronic obstructive pulmonary disease, unspecified: Secondary | ICD-10-CM | POA: Insufficient documentation

## 2018-03-07 DIAGNOSIS — D631 Anemia in chronic kidney disease: Secondary | ICD-10-CM | POA: Insufficient documentation

## 2018-03-07 DIAGNOSIS — I129 Hypertensive chronic kidney disease with stage 1 through stage 4 chronic kidney disease, or unspecified chronic kidney disease: Secondary | ICD-10-CM | POA: Insufficient documentation

## 2018-03-07 DIAGNOSIS — Z7902 Long term (current) use of antithrombotics/antiplatelets: Secondary | ICD-10-CM | POA: Diagnosis not present

## 2018-03-07 DIAGNOSIS — Z79899 Other long term (current) drug therapy: Secondary | ICD-10-CM | POA: Insufficient documentation

## 2018-03-07 DIAGNOSIS — I739 Peripheral vascular disease, unspecified: Secondary | ICD-10-CM | POA: Insufficient documentation

## 2018-03-07 DIAGNOSIS — I2510 Atherosclerotic heart disease of native coronary artery without angina pectoris: Secondary | ICD-10-CM | POA: Diagnosis not present

## 2018-03-07 DIAGNOSIS — D649 Anemia, unspecified: Secondary | ICD-10-CM

## 2018-03-07 LAB — CBC WITH DIFFERENTIAL/PLATELET
ABS IMMATURE GRANULOCYTES: 0.01 10*3/uL (ref 0.00–0.07)
BASOS PCT: 1 %
Basophils Absolute: 0 10*3/uL (ref 0.0–0.1)
Eosinophils Absolute: 0.2 10*3/uL (ref 0.0–0.5)
Eosinophils Relative: 4 %
HCT: 40.5 % (ref 36.0–46.0)
HEMOGLOBIN: 12.8 g/dL (ref 12.0–15.0)
IMMATURE GRANULOCYTES: 0 %
Lymphocytes Relative: 32 %
Lymphs Abs: 1.6 10*3/uL (ref 0.7–4.0)
MCH: 31.4 pg (ref 26.0–34.0)
MCHC: 31.6 g/dL (ref 30.0–36.0)
MCV: 99.5 fL (ref 80.0–100.0)
MONO ABS: 0.3 10*3/uL (ref 0.1–1.0)
MONOS PCT: 6 %
Neutro Abs: 2.9 10*3/uL (ref 1.7–7.7)
Neutrophils Relative %: 57 %
Platelets: 179 10*3/uL (ref 150–400)
RBC: 4.07 MIL/uL (ref 3.87–5.11)
RDW: 13.1 % (ref 11.5–15.5)
WBC: 5 10*3/uL (ref 4.0–10.5)
nRBC: 0 % (ref 0.0–0.2)

## 2018-03-07 LAB — COMPREHENSIVE METABOLIC PANEL
ALK PHOS: 93 U/L (ref 38–126)
ALT: 15 U/L (ref 0–44)
AST: 20 U/L (ref 15–41)
Albumin: 4.2 g/dL (ref 3.5–5.0)
Anion gap: 8 (ref 5–15)
BILIRUBIN TOTAL: 0.3 mg/dL (ref 0.3–1.2)
BUN: 15 mg/dL (ref 8–23)
CALCIUM: 9.2 mg/dL (ref 8.9–10.3)
CO2: 26 mmol/L (ref 22–32)
Chloride: 107 mmol/L (ref 98–111)
Creatinine, Ser: 1.3 mg/dL — ABNORMAL HIGH (ref 0.44–1.00)
GFR calc Af Amer: 50 mL/min — ABNORMAL LOW (ref >60)
GFR, EST NON AFRICAN AMERICAN: 43 mL/min — AB (ref >60)
GLUCOSE: 85 mg/dL (ref 70–99)
Potassium: 4.1 mmol/L (ref 3.5–5.1)
Sodium: 141 mmol/L (ref 135–145)
TOTAL PROTEIN: 7.4 g/dL (ref 6.5–8.1)

## 2018-03-07 LAB — FOLATE: FOLATE: 13.5 ng/mL (ref >5.9)

## 2018-03-07 LAB — IRON AND TIBC
IRON: 77 ug/dL (ref 28–170)
Saturation Ratios: 22 % (ref 10.4–31.8)
TIBC: 351 ug/dL (ref 250–450)
UIBC: 274 ug/dL

## 2018-03-07 LAB — TSH: TSH: 0.518 u[IU]/mL (ref 0.350–4.500)

## 2018-03-07 LAB — FERRITIN: Ferritin: 57 ng/mL (ref 11–307)

## 2018-03-07 LAB — VITAMIN B12: VITAMIN B 12: 657 pg/mL (ref 180–914)

## 2018-03-14 ENCOUNTER — Encounter (HOSPITAL_COMMUNITY): Payer: Self-pay | Admitting: Hematology

## 2018-03-14 ENCOUNTER — Other Ambulatory Visit: Payer: Self-pay

## 2018-03-14 ENCOUNTER — Inpatient Hospital Stay (HOSPITAL_BASED_OUTPATIENT_CLINIC_OR_DEPARTMENT_OTHER): Payer: Medicare Other | Admitting: Hematology

## 2018-03-14 DIAGNOSIS — N189 Chronic kidney disease, unspecified: Secondary | ICD-10-CM | POA: Diagnosis not present

## 2018-03-14 DIAGNOSIS — I129 Hypertensive chronic kidney disease with stage 1 through stage 4 chronic kidney disease, or unspecified chronic kidney disease: Secondary | ICD-10-CM

## 2018-03-14 DIAGNOSIS — D631 Anemia in chronic kidney disease: Secondary | ICD-10-CM | POA: Diagnosis not present

## 2018-03-14 DIAGNOSIS — D649 Anemia, unspecified: Secondary | ICD-10-CM

## 2018-03-14 DIAGNOSIS — Z79899 Other long term (current) drug therapy: Secondary | ICD-10-CM | POA: Diagnosis not present

## 2018-03-14 DIAGNOSIS — Z7902 Long term (current) use of antithrombotics/antiplatelets: Secondary | ICD-10-CM | POA: Diagnosis not present

## 2018-03-14 DIAGNOSIS — I2510 Atherosclerotic heart disease of native coronary artery without angina pectoris: Secondary | ICD-10-CM

## 2018-03-14 DIAGNOSIS — E538 Deficiency of other specified B group vitamins: Secondary | ICD-10-CM | POA: Diagnosis not present

## 2018-03-14 NOTE — Progress Notes (Signed)
[place], [place] [address]   CLINIC:  Medical Oncology/Hematology  PCP:  Doree Albee, MD [place] [address] [phone]   REASON FOR VISIT: Follow-up for normocytic anemia, B12 and iron deficiency  CURRENT THERAPY: oral iron tablets and B12    INTERVAL HISTORY:  Kaitlin [name] 68 y.o. female returns for routine follow-up for normocytic anemia, B12 and iron deficiency. She has complaints of constipation due to the oral iron. She takes a stool softener to help with this. She reports having numbness in her hands, this is stable at this time. Denies any nausea, vomiting, or diarrhea. Denies any new pains. Had not noticed any recent bleeding such as epistaxis, hematuria or hematochezia. Denies recent chest pain on exertion, shortness of breath on minimal exertion, pre-syncopal episodes, or palpitations. Denies any numbness or tingling in hands or feet. Denies any recent fevers, infections, or recent hospitalizations. Patient reports appetite at 100% and energy level at 100%. She reports she is eating well and maintaining her weight at this time.   REVIEW OF SYSTEMS:  Review of Systems  Gastrointestinal: Positive for constipation.  Neurological: Positive for numbness.  All other systems reviewed and are negative.    PAST MEDICAL/SURGICAL HISTORY:  Past Medical History:  Diagnosis Date  . Arthritis   . CAD (coronary artery disease)    stents  . COPD (chronic obstructive pulmonary disease) ([place])   . Emphysema of lung ([place])   . GERD (gastroesophageal reflux disease)   . Hyperlipidemia   . Hypertension   . Iliac artery stenosis, right (HCC)    60%-70%  . Myocardial infarction ([place])   . Normocytic anemia 01/26/2017  . PAD (peripheral artery disease) ([place]) 12/14/2010   in the left vertebral, bilateral carotids   Past Surgical History:  Procedure Laterality Date  . CARDIAC CATHETERIZATION  02/04/2005   A cypher 5.3 x  18 mm at 16 atmosphere of pressure in the mid LAD, this stent covered the proximal part of the LAD and also the mid LAD, this was then post dilated with 3.25 x 15 mm Quantum at 16 atmosphereof pressure for 45 seconds  . CATARACT EXTRACTION Bilateral   . CHOLECYSTECTOMY N/A 08/16/2017   Procedure: LAPAROSCOPIC CHOLECYSTECTOMY;  Surgeon: Aviva Signs, MD;  Location: [facility]S;  Service: General;  Laterality: N/A;  . COLONOSCOPY N/A 03/01/2017   Procedure: COLONOSCOPY;  Surgeon: Danie Binder, MD;  Location: [facility] END[facility] SU[facility];  Service: Endoscopy;  Laterality: N/A;  1:00pm  . ESOPHAGOGASTRODUODENOSCOPY N/A 01/28/2017   Procedure: ESOPHAGOGASTRODUODENOSCOPY (EGD);  Surgeon: Jerene Bears, MD;  Location: [facility] ENDOSCOPY;  Service: Gastroenterology;  Laterality: N/A;  . GIVENS CAPSULE STUDY N/A 03/12/2017   Procedure: GIVENS CAPSULE STUDY;  Surgeon: Danie Binder, MD;  Location: [facility];  Service: Endoscopy;  Laterality: N/A;  7:30am  . TUBAL LIGATION       SOCIAL HISTORY:  Social History   Socioeconomic History  . Marital status: Married    Spouse name: Marcello Moores  . Number of children: 3  . Years of education: 39  . Highest education level: Not on file  Occupational History  . Occupation: RETIRED    Comment: warehouse/textiles  Social Needs  . Financial resource strain: Not hard at all  . Food insecurity:    Worry: Never true    Inability: Never true  . Transportation needs:    Medical: No    Non-medical: No  Tobacco Use  .  Smoking status: Current Every Day Smoker    Packs/day: 1.00    Years: 49.00    Pack years: 49.00    Types: Cigarettes    Start date: 02/09/1967  . Smokeless tobacco: Never Used  Substance and Sexual Activity  . Alcohol use: No  . Drug use: No  . Sexual activity: Yes    Birth control/protection: Post-menopausal  Lifestyle  . Physical activity:    Days per week: 5 days    Minutes per session: 20 min  . Stress: Only a little  Relationships  . Social  connections:    Talks on phone: More than three times a week    Gets together: More than three times a week    Attends religious service: Never    Active member of club or organization: No    Attends meetings of clubs or organizations: Never    Relationship status: Married  . Intimate partner violence:    Fear of current or ex partner: No    Emotionally abused: No    Physically abused: No    Forced sexual activity: No  Other Topics Concern  . Not on file  Social History Narrative   Retired   Has an [employer] in [employer] with husband Marcello Moores for 23 years   Stays busy with home, gardens, likes to can    FAMILY HISTORY:  Family History  Problem Relation Age of Onset  . Heart attack Mother   . Hyperlipidemia Mother   . Hypertension Mother   . Diabetes Mother   . Heart attack Father   . Early death Father 24  . Hyperlipidemia Father   . Hypertension Father   . Heart disease Sister   . Hyperlipidemia Sister   . Hypertension Sister   . Diabetes Brother   . Heart disease Sister   . Hyperlipidemia Sister   . Hypertension Sister   . Heart attack Sister   . Heart disease Sister   . Hyperlipidemia Sister   . Hypertension Sister   . Heart attack Brother   . Early death Brother 64  . Hyperlipidemia Brother   . Hypertension Brother   . Heart attack Brother   . Early death Brother 54  . Hyperlipidemia Brother   . Hypertension Brother   . Cancer Maternal Aunt   . Colon cancer Neg Hx   . Colon polyps Neg Hx     CURRENT MEDICATIONS:  Outpatient Encounter Medications as of 03/14/2018  Medication Sig  . aspirin 81 MG chewable tablet Chew 81 mg by mouth daily.  . cholecalciferol (VITAMIN D) 1000 units tablet Take 1,000 Units by mouth daily.  . clopidogrel (PLAVIX) 75 MG tablet TAKE ONE (1) TABLET BY MOUTH EVERY DAY  . Cyanocobalamin (VITAMIN B 12 PO) Take 1 tablet by mouth daily.   . ferrous sulfate 325 (65 FE) MG tablet Take 325 mg by mouth daily with breakfast.  . lisinopril  (PRINIVIL,ZESTRIL) 2.5 MG tablet   . rosuvastatin (CRESTOR) 40 MG tablet TAKE ONE (1) TABLET BY MOUTH EVERY DAY (Patient taking differently: TAKE ONE TABLET(40mg ) BY MOUTH EVERY DAY)  . NITROSTAT 0.4 MG SL tablet PLACE 1 TABLET UNDER TONGUE EVERY 5 MINUTES FOR 3 DOSES AS NEEDED. (Patient not taking: Reported on 11/11/2017)  . pantoprazole (PROTONIX) 40 MG tablet TAKE ONE TABLET (40MG  TOTAL) BY MOUTH TWICE DAILY BEFORE A MEAL (Patient not taking: Reported on 03/14/2018)  . [DISCONTINUED] lisinopril (PRINIVIL,ZESTRIL) 5 MG tablet Take 2.5 mg by mouth daily.  No facility-administered encounter medications on file as of 03/14/2018.     ALLERGIES:  No Known Allergies   PHYSICAL EXAM:  ECOG Performance status: 1  Vitals:   03/14/18 1039  BP: 117/67  Pulse: 99  Resp: 16  Temp: 97.7 F (36.5 C)  SpO2: 99%   Filed Weights   03/14/18 1039  Weight: 132 lb (59.9 kg)    Physical Exam Constitutional:      Appearance: Normal appearance. She is normal weight.  Cardiovascular:     Rate and Rhythm: Normal rate and regular rhythm.     Heart sounds: Normal heart sounds.  Pulmonary:     Effort: Pulmonary effort is normal.     Breath sounds: Normal breath sounds.  Musculoskeletal: Normal range of motion.  Skin:    General: Skin is warm and dry.  Neurological:     Mental Status: She is alert and oriented to person, place, and time. Mental status is at baseline.  Psychiatric:        Mood and Affect: Mood normal.        Behavior: Behavior normal.        Thought Content: Thought content normal.        Judgment: Judgment normal.      LABORATORY DATA:  I have reviewed the labs as listed.  CBC    Component Value Date/Time   WBC 5.0 03/07/2018 1056   RBC 4.07 03/07/2018 1056   HGB 12.8 03/07/2018 1056   HCT 40.5 03/07/2018 1056   PLT 179 03/07/2018 1056   MCV 99.5 03/07/2018 1056   MCH 31.4 03/07/2018 1056   MCHC 31.6 03/07/2018 1056   RDW 13.1 03/07/2018 1056   LYMPHSABS 1.6  03/07/2018 1056   MONOABS 0.3 03/07/2018 1056   EOSABS 0.2 03/07/2018 1056   BASOSABS 0.0 03/07/2018 1056   CMP Latest Ref Rng & Units 03/07/2018 11/06/2017 08/14/2017  Glucose 70 - 99 mg/dL 85 124(H) 104(H)  BUN 8 - 23 mg/dL 15 17 20   Creatinine 0.44 - 1.00 mg/dL 1.30(H) 1.40(H) 1.73(H)  Sodium 135 - 145 mmol/L 141 141 138  Potassium 3.5 - 5.1 mmol/L 4.1 4.0 3.9  Chloride 98 - 111 mmol/L 107 106 106  CO2 22 - 32 mmol/L 26 27 23   Calcium 8.9 - 10.3 mg/dL 9.2 9.1 9.4  Total Protein 6.5 - 8.1 g/dL 7.4 7.2 7.2  Total Bilirubin 0.3 - 1.2 mg/dL 0.3 0.4 0.5  Alkaline Phos 38 - 126 U/L 93 95 101  AST 15 - 41 U/L 20 17 19   ALT 0 - 44 U/L 15 12 13        DIAGNOSTIC IMAGING:  I have independently reviewed the scans and discussed with the patient.   I have reviewed Francene Finders, NP's note and agree with the documentation.  I personally performed a face-to-face visit, made revisions and my assessment and plan is as follows.    ASSESSMENT & PLAN:   Normocytic anemia 1.  Normocytic anemia: -Combination anemia from CKD, iron deficiency and B12 deficiency. -EGD on 01/28/2017 showed chronic gastritis, colonoscopy on 03/01/2017 showed tubular adenoma and external hemorrhoids with no clear source of bleeding. - She is taking 2 iron tablets daily.  She has some constipation fairly well controlled. -We reviewed her blood work.  Hemoglobin normalized.  Ferritin is also staying stable.  She will continue iron tablets twice daily.  She will also continue B12 supplements. -We will see her back in 6 months for follow-up.  2.  CAD with  stents: She is taking aspirin and Plavix daily.  Denies any bleeding per rectum or melena.  3.  Hypertension: -She will continue lisinopril daily.  Blood pressure is well controlled.      Orders placed this encounter:  No orders of the defined types were placed in this encounter.     Derek Jack, MD [place] [phone]

## 2018-03-14 NOTE — Assessment & Plan Note (Signed)
1.  Normocytic anemia: -Combination anemia from CKD, iron deficiency and B12 deficiency. -EGD on 01/28/2017 showed chronic gastritis, colonoscopy on 03/01/2017 showed tubular adenoma and external hemorrhoids with no clear source of bleeding. - She is taking 2 iron tablets daily.  She has some constipation fairly well controlled. -We reviewed her blood work.  Hemoglobin normalized.  Ferritin is also staying stable.  She will continue iron tablets twice daily.  She will also continue B12 supplements. -We will see her back in 6 months for follow-up.  2.  CAD with stents: She is taking aspirin and Plavix daily.  Denies any bleeding per rectum or melena.  3.  Hypertension: -She will continue lisinopril daily.  Blood pressure is well controlled.

## 2018-03-14 NOTE — Patient Instructions (Signed)
[facility] at [facility] Discharge Instructions     Thank you for choosing [facility] at [facility] to provide your oncology and hematology care.  To afford each patient quality time with our provider, please arrive at least 15 minutes before your scheduled appointment time.   If you have a lab appointment with the Cancer Center please come in thru the  Main Entrance and check in at the main information desk  You need to re-schedule your appointment should you arrive 10 or more minutes late.  We strive to give you quality time with our providers, and arriving late affects you and other patients whose appointments are after yours.  Also, if you no show three or more times for appointments you may be dismissed from the clinic at the providers discretion.     Again, thank you for choosing [facility].  Our hope is that these requests will decrease the amount of time that you wait before being seen by our physicians.       _____________________________________________________________  Should you have questions after your visit to [facility], please contact our office at [phone] between the hours of 8:00 a.m. and 4:30 p.m.  Voicemails left after 4:00 p.m. will not be returned until the following business day.  For prescription refill requests, have your pharmacy contact our office and allow 72 hours.    Cancer Center Support Programs:   > Cancer Support Group  2nd Tuesday of the month 1pm-2pm, Journey Room    

## 2018-03-24 DIAGNOSIS — D509 Iron deficiency anemia, unspecified: Secondary | ICD-10-CM | POA: Diagnosis not present

## 2018-03-24 DIAGNOSIS — N183 Chronic kidney disease, stage 3 (moderate): Secondary | ICD-10-CM | POA: Diagnosis not present

## 2018-03-24 DIAGNOSIS — Z79899 Other long term (current) drug therapy: Secondary | ICD-10-CM | POA: Diagnosis not present

## 2018-03-24 DIAGNOSIS — R809 Proteinuria, unspecified: Secondary | ICD-10-CM | POA: Diagnosis not present

## 2018-03-24 DIAGNOSIS — E559 Vitamin D deficiency, unspecified: Secondary | ICD-10-CM | POA: Diagnosis not present

## 2018-03-24 DIAGNOSIS — I10 Essential (primary) hypertension: Secondary | ICD-10-CM | POA: Diagnosis not present

## 2018-03-28 DIAGNOSIS — N183 Chronic kidney disease, stage 3 (moderate): Secondary | ICD-10-CM | POA: Diagnosis not present

## 2018-03-28 DIAGNOSIS — R809 Proteinuria, unspecified: Secondary | ICD-10-CM | POA: Diagnosis not present

## 2018-03-28 DIAGNOSIS — N2581 Secondary hyperparathyroidism of renal origin: Secondary | ICD-10-CM | POA: Diagnosis not present

## 2018-03-28 DIAGNOSIS — E559 Vitamin D deficiency, unspecified: Secondary | ICD-10-CM | POA: Diagnosis not present

## 2018-04-11 ENCOUNTER — Encounter: Payer: Self-pay | Admitting: Cardiology

## 2018-04-11 ENCOUNTER — Ambulatory Visit (INDEPENDENT_AMBULATORY_CARE_PROVIDER_SITE_OTHER): Payer: Medicare Other | Admitting: Cardiology

## 2018-04-11 ENCOUNTER — Other Ambulatory Visit: Payer: Self-pay

## 2018-04-11 VITALS — BP 96/50 | HR 100 | Ht 61.0 in | Wt 131.0 lb

## 2018-04-11 DIAGNOSIS — I2510 Atherosclerotic heart disease of native coronary artery without angina pectoris: Secondary | ICD-10-CM | POA: Diagnosis not present

## 2018-04-11 DIAGNOSIS — I10 Essential (primary) hypertension: Secondary | ICD-10-CM

## 2018-04-11 DIAGNOSIS — E782 Mixed hyperlipidemia: Secondary | ICD-10-CM | POA: Diagnosis not present

## 2018-04-11 DIAGNOSIS — I729 Aneurysm of unspecified site: Secondary | ICD-10-CM | POA: Diagnosis not present

## 2018-04-11 DIAGNOSIS — Z9889 Other specified postprocedural states: Secondary | ICD-10-CM

## 2018-04-11 NOTE — Patient Instructions (Signed)
Medication Instructions:  Your physician recommends that you continue on your current medications as directed. Please refer to the Current Medication list given to you today.    Labwork: NONE  Testing/Procedures: Your physician has requested that you have an abdominal aorta duplex. During this test, an ultrasound is used to evaluate the aorta. Allow 30 minutes for this exam. Do not eat after midnight the day before and avoid carbonated beverages   Follow-Up: Your physician wants you to follow-up in: 6 MONTHS.  You will receive a reminder letter in the mail two months in advance. If you don't receive a letter, please call our office to schedule the follow-up appointment.   Any Other Special Instructions Will Be Listed Below (If Applicable).     If you need a refill on your cardiac medications before your next appointment, please call your pharmacy.

## 2018-04-11 NOTE — Progress Notes (Signed)
Clinical Summary Ms. Madl is a 68 y.o.female seen today for follow up of the following medical problems.   1. CAD  - w/ DES to LAD and BMSx2 to RCA Jan 2007, reported normal LV function from notes.  - the RCA PCI was complicated by dissection which was also stented   - was temporaily off ASA and plavix due to severe anemia requiring transfusion. Extensive GI workup without clear source of bleeding - back on ASA and plavix now.    - slight pain left sided, infrequent. No exertional symptoms. No SOB/DOE - compliant with meds.   2. PAD  - 60-70% right external iliac artery stenosis by previous cath  - no recent claudication.  - no recent symptoms   3. Carotid stenosis  - prior right carotid endarterectomy   - stable follow up US 09/2017. - no recent symptoms    4. HTN  -compliant withmeds   5. Hyperlipidemia  - she is compliant with statin  6. AAA 07/2014 small AAA 3 x 3.2 cm. - 12/2016 3.2 cm and stable.    7. Anemia - previously severe as low as 6.7, required transfusion - followed by hematology. From notes thought to be related to CKD - GI workup with EGD showed chronic gastritis, colonscopy no clear bleeding source.      SH: just retired from [employer]. Spends most of time with great grandchildren (9 months, 2.5 years). Another is due in August.     Past Medical History:  Diagnosis Date  . Arthritis   . CAD (coronary artery disease)    stents  . COPD (chronic obstructive pulmonary disease) ([place])   . Emphysema of lung ([place])   . GERD (gastroesophageal reflux disease)   . Hyperlipidemia   . Hypertension   . Iliac artery stenosis, right (HCC)    60%-70%  . Myocardial infarction ([place])   . Normocytic anemia 01/26/2017  . PAD (peripheral artery disease) ([place]) 12/14/2010   in the left vertebral, bilateral carotids     No Known Allergies   Current Outpatient Medications  Medication Sig Dispense  Refill  . aspirin 81 MG chewable tablet Chew 81 mg by mouth daily.    . cholecalciferol (VITAMIN D) 1000 units tablet Take 1,000 Units by mouth daily.    . clopidogrel (PLAVIX) 75 MG tablet TAKE ONE (1) TABLET BY MOUTH EVERY DAY 90 tablet 3  . Cyanocobalamin (VITAMIN B 12 PO) Take 1 tablet by mouth daily.     . ferrous sulfate 325 (65 FE) MG tablet Take 325 mg by mouth daily with breakfast.    . lisinopril (PRINIVIL,ZESTRIL) 2.5 MG tablet     . NITROSTAT 0.4 MG SL tablet PLACE 1 TABLET UNDER TONGUE EVERY 5 MINUTES FOR 3 DOSES AS NEEDED. (Patient not taking: Reported on 11/11/2017) 25 tablet 3  . pantoprazole (PROTONIX) 40 MG tablet TAKE ONE TABLET (40MG  TOTAL) BY MOUTH TWICE DAILY BEFORE A MEAL (Patient not taking: Reported on 03/14/2018) 60 tablet 5  . rosuvastatin (CRESTOR) 40 MG tablet TAKE ONE (1) TABLET BY MOUTH EVERY DAY (Patient taking differently: TAKE ONE TABLET(40mg ) BY MOUTH EVERY DAY) 90 tablet 2   No current facility-administered medications for this visit.      Past Surgical History:  Procedure Laterality Date  . CARDIAC CATHETERIZATION  02/04/2005   A cypher 5.3 x 18 mm at 16 atmosphere of pressure in the mid LAD, this stent covered the proximal part of the LAD and also the mid LAD,  this was then post dilated with 3.25 x 15 mm Quantum at 16 atmosphereof pressure for 45 seconds  . CATARACT EXTRACTION Bilateral   . CHOLECYSTECTOMY N/A 08/16/2017   Procedure: LAPAROSCOPIC CHOLECYSTECTOMY;  Surgeon: Aviva Signs, MD;  Location: [facility] ORS;  Service: General;  Laterality: N/A;  . COLONOSCOPY N/A 03/01/2017   Procedure: COLONOSCOPY;  Surgeon: Danie Binder, MD;  Location: [facility]O [facility];  Service: Endoscopy;  Laterality: N/A;  1:00pm  . ESOPHAGOGASTRODUODENOSCOPY N/A 01/28/2017   Procedure: ESOPHAGOGASTRODUODENOSCOPY (EGD);  Surgeon: Jerene Bears, MD;  Location: [facility] ENDOSCOPY;  Service: Gastroenterology;  Laterality: N/A;  . GIVENS CAPSULE STUDY N/A 03/12/2017   Procedure: GIVENS CAPSULE  STUDY;  Surgeon: Danie Binder, MD;  Location: [facility];  Service: Endoscopy;  Laterality: N/A;  7:30am  . TUBAL LIGATION       No Known Allergies    Family History  Problem Relation Age of Onset  . Heart attack Mother   . Hyperlipidemia Mother   . Hypertension Mother   . Diabetes Mother   . Heart attack Father   . Early death Father 90  . Hyperlipidemia Father   . Hypertension Father   . Heart disease Sister   . Hyperlipidemia Sister   . Hypertension Sister   . Diabetes Brother   . Heart disease Sister   . Hyperlipidemia Sister   . Hypertension Sister   . Heart attack Sister   . Heart disease Sister   . Hyperlipidemia Sister   . Hypertension Sister   . Heart attack Brother   . Early death Brother 20  . Hyperlipidemia Brother   . Hypertension Brother   . Heart attack Brother   . Early death Brother 52  . Hyperlipidemia Brother   . Hypertension Brother   . Cancer Maternal Aunt   . Colon cancer Neg Hx   . Colon polyps Neg Hx      Social History Ms. Brager reports that she has been smoking cigarettes. She started smoking about 51 years ago. She has a 49.00 pack-year smoking history. She has never used smokeless tobacco. Ms. Calamia reports no history of alcohol use.   Review of Systems CONSTITUTIONAL: No weight loss, fever, chills, weakness or fatigue.  HEENT: Eyes: No visual loss, blurred vision, double vision or yellow sclerae.No hearing loss, sneezing, congestion, runny nose or sore throat.  SKIN: No rash or itching.  CARDIOVASCULAR: per hpi RESPIRATORY: No shortness of breath, cough or sputum.  GASTROINTESTINAL: No anorexia, nausea, vomiting or diarrhea. No abdominal pain or blood.  GENITOURINARY: No burning on urination, no polyuria NEUROLOGICAL: No headache, dizziness, syncope, paralysis, ataxia, numbness or tingling in the extremities. No change in bowel or bladder control.  MUSCULOSKELETAL: No muscle, back pain, joint pain or stiffness.   LYMPHATICS: No enlarged nodes. No history of splenectomy.  PSYCHIATRIC: No history of depression or anxiety.  ENDOCRINOLOGIC: No reports of sweating, cold or heat intolerance. No polyuria or polydipsia.  Marland Kitchen   Physical Examination Today's Vitals   04/11/18 0958  BP: (!) 96/50  Pulse: 100  SpO2: 98%  Weight: 131 lb (59.4 kg)  Height: 5\' 1"  (1.549 m)   Body mass index is 24.75 kg/m.  Gen: resting comfortably, no acute distress HEENT: no scleral icterus, pupils equal round and reactive, no palptable cervical adenopathy,  CV: RRR, no m/r/g no jvd Resp: Clear to auscultation bilaterally GI: abdomen is soft, non-tender, non-distended, normal bowel sounds, no hepatosplenomegaly MSK: extremities are warm, no edema.  Skin: warm,  no rash Neuro:  no focal deficits Psych: appropriate affect   Diagnostic Studies 07/2014 Carotid US - 40-59% bilateral diseae, right CEA is patent.   07/2014 AAA [place] 3 x 3.2 aneurysm  05/2016 Carotid US IMPRESSION: Less than 50% stenosis in the right internal carotid artery.  50-69% stenosis in the left internal carotid artery.    Assessment and Plan   1. CAD - Given her history of complex RCA PCI as well as carotid and PAD would favor continuing DAPT - doing well without cardiac symptoms, continue current meds  2. HTN - at goal, continue current meds  3. Hyperlipidemia - request labs from pcp, continue statin  4. Carotid stenosis - continue medical therapy  5. AAA - repeat US   F/u 6 months   Arnoldo Lenis, M.D.

## 2018-04-15 ENCOUNTER — Other Ambulatory Visit: Payer: Self-pay

## 2018-04-15 DIAGNOSIS — I714 Abdominal aortic aneurysm, without rupture, unspecified: Secondary | ICD-10-CM

## 2018-05-02 ENCOUNTER — Ambulatory Visit (HOSPITAL_COMMUNITY): Admission: RE | Admit: 2018-05-02 | Payer: Medicare Other | Source: Ambulatory Visit

## 2018-06-02 ENCOUNTER — Ambulatory Visit (INDEPENDENT_AMBULATORY_CARE_PROVIDER_SITE_OTHER): Payer: Medicare Other | Admitting: Internal Medicine

## 2018-06-02 DIAGNOSIS — D649 Anemia, unspecified: Secondary | ICD-10-CM | POA: Diagnosis not present

## 2018-06-02 DIAGNOSIS — E559 Vitamin D deficiency, unspecified: Secondary | ICD-10-CM | POA: Diagnosis not present

## 2018-06-02 DIAGNOSIS — I10 Essential (primary) hypertension: Secondary | ICD-10-CM | POA: Diagnosis not present

## 2018-06-02 DIAGNOSIS — E785 Hyperlipidemia, unspecified: Secondary | ICD-10-CM | POA: Diagnosis not present

## 2018-09-05 ENCOUNTER — Other Ambulatory Visit: Payer: Self-pay

## 2018-09-05 ENCOUNTER — Inpatient Hospital Stay (HOSPITAL_COMMUNITY): Payer: Medicare Other | Attending: Hematology

## 2018-09-05 DIAGNOSIS — E611 Iron deficiency: Secondary | ICD-10-CM | POA: Diagnosis not present

## 2018-09-05 DIAGNOSIS — I252 Old myocardial infarction: Secondary | ICD-10-CM | POA: Insufficient documentation

## 2018-09-05 DIAGNOSIS — Z7982 Long term (current) use of aspirin: Secondary | ICD-10-CM | POA: Insufficient documentation

## 2018-09-05 DIAGNOSIS — Z79899 Other long term (current) drug therapy: Secondary | ICD-10-CM | POA: Insufficient documentation

## 2018-09-05 DIAGNOSIS — D649 Anemia, unspecified: Secondary | ICD-10-CM

## 2018-09-05 DIAGNOSIS — N189 Chronic kidney disease, unspecified: Secondary | ICD-10-CM | POA: Insufficient documentation

## 2018-09-05 DIAGNOSIS — J449 Chronic obstructive pulmonary disease, unspecified: Secondary | ICD-10-CM | POA: Diagnosis not present

## 2018-09-05 DIAGNOSIS — D631 Anemia in chronic kidney disease: Secondary | ICD-10-CM | POA: Insufficient documentation

## 2018-09-05 DIAGNOSIS — Z7902 Long term (current) use of antithrombotics/antiplatelets: Secondary | ICD-10-CM | POA: Insufficient documentation

## 2018-09-05 DIAGNOSIS — F17210 Nicotine dependence, cigarettes, uncomplicated: Secondary | ICD-10-CM | POA: Diagnosis not present

## 2018-09-05 DIAGNOSIS — E538 Deficiency of other specified B group vitamins: Secondary | ICD-10-CM | POA: Diagnosis not present

## 2018-09-05 DIAGNOSIS — I10 Essential (primary) hypertension: Secondary | ICD-10-CM | POA: Diagnosis not present

## 2018-09-05 LAB — COMPREHENSIVE METABOLIC PANEL
ALT: 16 U/L (ref 0–44)
AST: 18 U/L (ref 15–41)
Albumin: 4.4 g/dL (ref 3.5–5.0)
Alkaline Phosphatase: 99 U/L (ref 38–126)
Anion gap: 10 (ref 5–15)
BUN: 18 mg/dL (ref 8–23)
CO2: 25 mmol/L (ref 22–32)
Calcium: 9.6 mg/dL (ref 8.9–10.3)
Chloride: 105 mmol/L (ref 98–111)
Creatinine, Ser: 1.59 mg/dL — ABNORMAL HIGH (ref 0.44–1.00)
GFR calc Af Amer: 39 mL/min — ABNORMAL LOW (ref >60)
GFR calc non Af Amer: 33 mL/min — ABNORMAL LOW (ref >60)
Glucose, Bld: 101 mg/dL — ABNORMAL HIGH (ref 70–99)
Potassium: 3.9 mmol/L (ref 3.5–5.1)
Sodium: 140 mmol/L (ref 135–145)
Total Bilirubin: 0.5 mg/dL (ref 0.3–1.2)
Total Protein: 7.7 g/dL (ref 6.5–8.1)

## 2018-09-05 LAB — FERRITIN: Ferritin: 65 ng/mL (ref 11–307)

## 2018-09-05 LAB — IRON AND TIBC
Iron: 57 ug/dL (ref 28–170)
Saturation Ratios: 17 % (ref 10.4–31.8)
TIBC: 337 ug/dL (ref 250–450)
UIBC: 280 ug/dL

## 2018-09-05 LAB — FOLATE: Folate: 38.8 ng/mL (ref >5.9)

## 2018-09-05 LAB — CBC WITH DIFFERENTIAL/PLATELET
Abs Immature Granulocytes: 0.01 10*3/uL (ref 0.00–0.07)
Basophils Absolute: 0 10*3/uL (ref 0.0–0.1)
Basophils Relative: 1 %
Eosinophils Absolute: 0.2 10*3/uL (ref 0.0–0.5)
Eosinophils Relative: 4 %
HCT: 40.5 % (ref 36.0–46.0)
Hemoglobin: 13 g/dL (ref 12.0–15.0)
Immature Granulocytes: 0 %
Lymphocytes Relative: 34 %
Lymphs Abs: 2 10*3/uL (ref 0.7–4.0)
MCH: 31.8 pg (ref 26.0–34.0)
MCHC: 32.1 g/dL (ref 30.0–36.0)
MCV: 99 fL (ref 80.0–100.0)
Monocytes Absolute: 0.3 10*3/uL (ref 0.1–1.0)
Monocytes Relative: 6 %
Neutro Abs: 3.2 10*3/uL (ref 1.7–7.7)
Neutrophils Relative %: 55 %
Platelets: 208 10*3/uL (ref 150–400)
RBC: 4.09 MIL/uL (ref 3.87–5.11)
RDW: 13.1 % (ref 11.5–15.5)
WBC: 5.8 10*3/uL (ref 4.0–10.5)
nRBC: 0 % (ref 0.0–0.2)

## 2018-09-05 LAB — VITAMIN B12: Vitamin B-12: 605 pg/mL (ref 180–914)

## 2018-09-06 LAB — VITAMIN D 25 HYDROXY (VIT D DEFICIENCY, FRACTURES): Vit D, 25-Hydroxy: 63.9 ng/mL (ref 30.0–100.0)

## 2018-09-11 ENCOUNTER — Other Ambulatory Visit: Payer: Self-pay

## 2018-09-11 ENCOUNTER — Ambulatory Visit (HOSPITAL_COMMUNITY)
Admission: RE | Admit: 2018-09-11 | Discharge: 2018-09-11 | Disposition: A | Discharge status: Home / Self Care | Payer: Medicare Other | Source: Ambulatory Visit | Attending: Cardiology | Admitting: Cardiology

## 2018-09-11 DIAGNOSIS — I714 Abdominal aortic aneurysm, without rupture, unspecified: Secondary | ICD-10-CM

## 2018-09-12 ENCOUNTER — Inpatient Hospital Stay (HOSPITAL_BASED_OUTPATIENT_CLINIC_OR_DEPARTMENT_OTHER): Payer: Medicare Other | Admitting: Hematology

## 2018-09-12 DIAGNOSIS — N189 Chronic kidney disease, unspecified: Secondary | ICD-10-CM | POA: Diagnosis not present

## 2018-09-12 DIAGNOSIS — I10 Essential (primary) hypertension: Secondary | ICD-10-CM | POA: Diagnosis not present

## 2018-09-12 DIAGNOSIS — E611 Iron deficiency: Secondary | ICD-10-CM | POA: Diagnosis not present

## 2018-09-12 DIAGNOSIS — D649 Anemia, unspecified: Secondary | ICD-10-CM

## 2018-09-12 DIAGNOSIS — F17210 Nicotine dependence, cigarettes, uncomplicated: Secondary | ICD-10-CM | POA: Diagnosis not present

## 2018-09-12 DIAGNOSIS — D631 Anemia in chronic kidney disease: Secondary | ICD-10-CM | POA: Diagnosis not present

## 2018-09-12 DIAGNOSIS — E538 Deficiency of other specified B group vitamins: Secondary | ICD-10-CM | POA: Diagnosis not present

## 2018-09-12 NOTE — Assessment & Plan Note (Signed)
1.  Normocytic anemia: -Combination anemia from CKD, iron deficiency and B12 deficiency. -EGD on 01/28/2017 showed chronic gastritis, colonoscopy on 03/01/2017 showed tubular adenoma and external hemorrhoids with no clear source of bleeding. - She is taking 2 iron tablets daily.  She has some constipation fairly well controlled. - Hemoglobin remained stable at 13.  Ferritin is within normal.  Recommend she continue on oral iron. -She will return to clinic in 6 months or sooner if needed.   2.  CAD with stents: She is taking aspirin and Plavix daily.  Denies any bleeding per rectum or melena.  3.  Hypertension: -She will continue lisinopril daily.  Blood pressure is well controlled.

## 2018-09-12 NOTE — Progress Notes (Signed)
[name], [place] [address]   CLINIC:  Medical Oncology/Hematology  PCP:  Doree Albee, MD [place] [address] [phone]   REASON FOR VISIT:  Follow-up for IDA  CURRENT THERAPY: oral iron     INTERVAL HISTORY:  [name] 68 y.o. female presents today for follow up.  She reports overall doing well.  She denies any significant fatigue.  No signs of bleeding.  No chest pain, shortness of breath, lightheadedness or dizziness.  Denies any abdominal pain.  No change in bowel habits.  No weight loss.  No recent hospitalizations or infections.  She is here for repeat labs and office visit.   REVIEW OF SYSTEMS:  Review of Systems  Constitutional: Negative.   HENT:  Negative.   Eyes: Negative.   Respiratory: Negative.   Cardiovascular: Negative.   Gastrointestinal: Negative.   Endocrine: Negative.   Genitourinary: Negative.    Musculoskeletal: Positive for arthralgias.  Skin: Negative.   Neurological: Negative.   Hematological: Negative.   Psychiatric/Behavioral: Negative.      PAST MEDICAL/SURGICAL HISTORY:  Past Medical History:  Diagnosis Date  . Arthritis   . CAD (coronary artery disease)    stents  . COPD (chronic obstructive pulmonary disease) ([place])   . Emphysema of lung ([place])   . GERD (gastroesophageal reflux disease)   . Hyperlipidemia   . Hypertension   . Iliac artery stenosis, right (HCC)    60%-70%  . Myocardial infarction ([place])   . Normocytic anemia 01/26/2017  . PAD (peripheral artery disease) ([place]) 12/14/2010   in the left vertebral, bilateral carotids   Past Surgical History:  Procedure Laterality Date  . CARDIAC CATHETERIZATION  02/04/2005   A cypher 5.3 x 18 mm at 16 atmosphere of pressure in the mid LAD, this stent covered the proximal part of the LAD and also the mid LAD, this was then post dilated with 3.25 x 15 mm Quantum at 16 atmosphereof pressure for 45 seconds  . CATARACT  EXTRACTION Bilateral   . CHOLECYSTECTOMY N/A 08/16/2017   Procedure: LAPAROSCOPIC CHOLECYSTECTOMY;  Surgeon: Aviva Signs, MD;  Location: [facility];  Service: General;  Laterality: N/A;  . COLONOSCOPY N/A 03/01/2017   Procedure: COLONOSCOPY;  Surgeon: Danie Binder, MD;  Location: [facility] SU[facility];  Service: Endoscopy;  Laterality: N/A;  1:00pm  . ESOPHAGOGASTRODUODENOSCOPY N/A 01/28/2017   Procedure: ESOPHAGOGASTRODUODENOSCOPY (EGD);  Surgeon: Jerene Bears, MD;  Location: [facility] ENDOSCOPY;  Service: Gastroenterology;  Laterality: N/A;  . GIVENS CAPSULE STUDY N/A 03/12/2017   Procedure: GIVENS CAPSULE STUDY;  Surgeon: Danie Binder, MD;  Location: [facility] END[facility] SU[facility];  Service: Endoscopy;  Laterality: N/A;  7:30am  . TUBAL LIGATION       SOCIAL HISTORY:  Social History   Socioeconomic History  . Marital status: Married    Spouse name: Marcello Moores  . Number of children: 3  . Years of education: 35  . Highest education level: Not on file  Occupational History  . Occupation: RETIRED    Comment: warehouse/textiles  Social Needs  . Financial resource strain: Not hard at all  . Food insecurity    Worry: Never true    Inability: Never true  . Transportation needs    Medical: No    Non-medical: No  Tobacco Use  . Smoking status: Current Every Day Smoker    Packs/day: 1.00    Years: 49.00    Pack years: 49.00    Types:  Cigarettes    Start date: 02/09/1967  . Smokeless tobacco: Never Used  Substance and Sexual Activity  . Alcohol use: No  . Drug use: No  . Sexual activity: Yes    Birth control/protection: Post-menopausal  Lifestyle  . Physical activity    Days per week: 5 days    Minutes per session: 20 min  . Stress: Only a little  Relationships  . Social connections    Talks on phone: More than three times a week    Gets together: More than three times a week    Attends religious service: Never    Active member of club or organization: No    Attends meetings of clubs or organizations:  Never    Relationship status: Married  . Intimate partner violence    Fear of current or ex partner: No    Emotionally abused: No    Physically abused: No    Forced sexual activity: No  Other Topics Concern  . Not on file  Social History Narrative   Retired   Has an [place] in [employer] with husband Marcello Moores for 23 years   Stays busy with home, gardens, likes to can    FAMILY HISTORY:  Family History  Problem Relation Age of Onset  . Heart attack Mother   . Hyperlipidemia Mother   . Hypertension Mother   . Diabetes Mother   . Heart attack Father   . Early death Father 40  . Hyperlipidemia Father   . Hypertension Father   . Heart disease Sister   . Hyperlipidemia Sister   . Hypertension Sister   . Diabetes Brother   . Heart disease Sister   . Hyperlipidemia Sister   . Hypertension Sister   . Heart attack Sister   . Heart disease Sister   . Hyperlipidemia Sister   . Hypertension Sister   . Heart attack Brother   . Early death Brother 77  . Hyperlipidemia Brother   . Hypertension Brother   . Heart attack Brother   . Early death Brother 48  . Hyperlipidemia Brother   . Hypertension Brother   . Cancer Maternal Aunt   . Colon cancer Neg Hx   . Colon polyps Neg Hx     CURRENT MEDICATIONS:  Outpatient Encounter Medications as of 09/12/2018  Medication Sig  . aspirin 81 MG chewable tablet Chew 81 mg by mouth daily.  . cholecalciferol (VITAMIN D) 1000 units tablet Take 1,000 Units by mouth daily.  . clopidogrel (PLAVIX) 75 MG tablet TAKE ONE (1) TABLET BY MOUTH EVERY DAY  . Cyanocobalamin (VITAMIN B 12 PO) Take 1 tablet by mouth daily.   . ferrous sulfate 325 (65 FE) MG tablet Take 325 mg by mouth daily with breakfast.  . lisinopril (PRINIVIL,ZESTRIL) 2.5 MG tablet   . NITROSTAT 0.4 MG SL tablet PLACE 1 TABLET UNDER TONGUE EVERY 5 MINUTES FOR 3 DOSES AS NEEDED.  Marland Kitchen rosuvastatin (CRESTOR) 40 MG tablet Take 40 mg by mouth daily.   No facility-administered encounter  medications on file as of 09/12/2018.     ALLERGIES:  No Known Allergies   PHYSICAL EXAM:  ECOG Performance status: 1  Vitals:   09/12/18 1151  BP: (!) 105/47  Pulse: 97  Temp: 97.7 F (36.5 C)  SpO2: 100%   Filed Weights   09/12/18 1151  Weight: 128 lb 9.6 oz (58.3 kg)    Physical Exam Constitutional:      Appearance: Normal appearance.  HENT:  Head: Normocephalic.     Right Ear: External ear normal.     Left Ear: External ear normal.     Nose: Nose normal.     Mouth/Throat:     Pharynx: Oropharynx is clear.  Eyes:     Extraocular Movements: Extraocular movements intact.     Conjunctiva/sclera: Conjunctivae normal.  Neck:     Musculoskeletal: Normal range of motion.  Cardiovascular:     Rate and Rhythm: Normal rate and regular rhythm.     Pulses: Normal pulses.     Heart sounds: Normal heart sounds.  Pulmonary:     Effort: Pulmonary effort is normal.     Breath sounds: Normal breath sounds.  Abdominal:     General: Abdomen is flat. Bowel sounds are normal.  Musculoskeletal: Normal range of motion.  Skin:    General: Skin is warm.  Neurological:     General: No focal deficit present.     Mental Status: She is alert and oriented to person, place, and time.  Psychiatric:        Mood and Affect: Mood normal.        Behavior: Behavior normal.        Thought Content: Thought content normal.        Judgment: Judgment normal.      LABORATORY DATA:  I have reviewed the labs as listed.  CBC    Component Value Date/Time   WBC 5.8 09/05/2018 1146   RBC 4.09 09/05/2018 1146   HGB 13.0 09/05/2018 1146   HCT 40.5 09/05/2018 1146   PLT 208 09/05/2018 1146   MCV 99.0 09/05/2018 1146   MCH 31.8 09/05/2018 1146   MCHC 32.1 09/05/2018 1146   RDW 13.1 09/05/2018 1146   LYMPHSABS 2.0 09/05/2018 1146   MONOABS 0.3 09/05/2018 1146   EOSABS 0.2 09/05/2018 1146   BASOSABS 0.0 09/05/2018 1146   CMP Latest Ref Rng & Units 09/05/2018 03/07/2018 11/06/2017  Glucose  70 - 99 mg/dL 101(H) 85 124(H)  BUN 8 - 23 mg/dL 18 15 17   Creatinine 0.44 - 1.00 mg/dL 1.59(H) 1.30(H) 1.40(H)  Sodium 135 - 145 mmol/L 140 141 141  Potassium 3.5 - 5.1 mmol/L 3.9 4.1 4.0  Chloride 98 - 111 mmol/L 105 107 106  CO2 22 - 32 mmol/L 25 26 27   Calcium 8.9 - 10.3 mg/dL 9.6 9.2 9.1  Total Protein 6.5 - 8.1 g/dL 7.7 7.4 7.2  Total Bilirubin 0.3 - 1.2 mg/dL 0.5 0.3 0.4  Alkaline Phos 38 - 126 U/L 99 93 95  AST 15 - 41 U/L 18 20 17   ALT 0 - 44 U/L 16 15 12         ASSESSMENT & PLAN:   Normocytic anemia 1.  Normocytic anemia: -Combination anemia from CKD, iron deficiency and B12 deficiency. -EGD on 01/28/2017 showed chronic gastritis, colonoscopy on 03/01/2017 showed tubular adenoma and external hemorrhoids with no clear source of bleeding. - She is taking 2 iron tablets daily.  She has some constipation fairly well controlled. - Hemoglobin remained stable at 13.  Ferritin is within normal.  Recommend she continue on oral iron. -She will return to clinic in 6 months or sooner if needed.   2.  CAD with stents: She is taking aspirin and Plavix daily.  Denies any bleeding per rectum or melena.  3.  Hypertension: -She will continue lisinopril daily.  Blood pressure is well controlled.      Orders placed this encounter:  Orders Placed This Encounter  Procedures  . CBC with Differential  . Comprehensive metabolic panel  . Ferritin  . Iron and TIBC  . Vitamin B12  . [place], [place] [phone]

## 2018-09-19 ENCOUNTER — Telehealth: Payer: Self-pay

## 2018-09-19 NOTE — Telephone Encounter (Signed)
-----   Message from Arnoldo Lenis, MD sent at 09/17/2018 10:57 AM EDT ----- Abdomina aneurysm just mildly bigger from last study but still overall small, we will continue to monitor   J BrancH MD

## 2018-09-19 NOTE — Telephone Encounter (Signed)
Pt made aware. Copy to pcp.  

## 2018-10-01 ENCOUNTER — Encounter (INDEPENDENT_AMBULATORY_CARE_PROVIDER_SITE_OTHER): Payer: Self-pay | Admitting: Internal Medicine

## 2018-10-01 ENCOUNTER — Other Ambulatory Visit: Payer: Self-pay

## 2018-10-01 ENCOUNTER — Ambulatory Visit (INDEPENDENT_AMBULATORY_CARE_PROVIDER_SITE_OTHER): Payer: Medicare Other | Admitting: Internal Medicine

## 2018-10-01 VITALS — BP 120/72 | HR 80 | Ht 61.0 in | Wt 128.4 lb

## 2018-10-01 DIAGNOSIS — F17210 Nicotine dependence, cigarettes, uncomplicated: Secondary | ICD-10-CM | POA: Diagnosis not present

## 2018-10-01 DIAGNOSIS — N183 Chronic kidney disease, stage 3 unspecified: Secondary | ICD-10-CM

## 2018-10-01 DIAGNOSIS — J449 Chronic obstructive pulmonary disease, unspecified: Secondary | ICD-10-CM | POA: Diagnosis not present

## 2018-10-01 DIAGNOSIS — I2510 Atherosclerotic heart disease of native coronary artery without angina pectoris: Secondary | ICD-10-CM | POA: Diagnosis not present

## 2018-10-01 NOTE — Patient Instructions (Signed)
Please call if you have worsening breathing problems,as discussed.

## 2018-10-01 NOTE — Progress Notes (Signed)
Subjective:  Patient ID: Blenda Peals, female    DOB: 10-08-1950  Age: 68 y.o. MRN: [id number]  CC: This lady comes in for follow-up of her multiple medical problems including COPD, hypertension, hyperlipidemia, coronary artery disease, vitamin D deficiency.  HPI Unfortunately, she still continues to smoke about a pack of cigarettes per day and tells me that her "bronchitis" is worse more recently although she denies any fever, purulent productive cough or wheezing. She is compliant with her medications listed below. She does see nephrology for kidney insufficiency and she just informed me that her nephrologist is retiring.  Her renal function that was checked more recently shows slight worsening of creatinine.   Past Medical History:  Diagnosis Date  . AAA (abdominal aortic aneurysm) ([place])   . Arthritis   . CAD (coronary artery disease)    stents  . COPD (chronic obstructive pulmonary disease) ([place])   . Emphysema of lung ([place])   . GERD (gastroesophageal reflux disease)   . Hyperlipidemia   . Hypertension   . Iliac artery stenosis, right (HCC)    60%-70%  . Myocardial infarction ([place])   . Normocytic anemia 01/26/2017  . PAD (peripheral artery disease) ([place]) 12/14/2010   in the left vertebral, bilateral carotids     Social History   Social History Narrative   Retired   Has an [place] in [employer] with husband Marcello Moores for 23 years   Stays busy with home, gardens, likes to can   Smoker of at least 1 pack of cigarettes per day. Current Meds  Medication Sig  . aspirin 81 MG chewable tablet Chew 81 mg by mouth daily.  . cholecalciferol (VITAMIN D) 1000 units tablet Take 2 Units by mouth daily.   . clopidogrel (PLAVIX) 75 MG tablet TAKE ONE (1) TABLET BY MOUTH EVERY DAY  . Cyanocobalamin (VITAMIN B 12 PO) Take 1 tablet by mouth daily.   . ferrous sulfate 325 (65 FE) MG tablet Take 325 mg by mouth daily with breakfast.  . lisinopril (PRINIVIL,ZESTRIL) 2.5 MG tablet   .  NITROSTAT 0.4 MG SL tablet PLACE 1 TABLET UNDER TONGUE EVERY 5 MINUTES FOR 3 DOSES AS NEEDED.  Marland Kitchen rosuvastatin (CRESTOR) 40 MG tablet Take 40 mg by mouth daily.       Objective:   Today's Vitals: BP 120/72   Pulse 80   Ht 5\' 1"  (1.549 m)   Wt 128 lb 6.4 oz (58.2 kg)   BMI 24.26 kg/m  Vitals with BMI 10/01/2018 09/12/2018 04/11/2018  Height 5\' 1"  - 5\' 1"   Weight 128 lbs 6 oz 128 lbs 10 oz 131 lbs  BMI 47.65 - 46.50  Systolic 354 656 96  Diastolic 72 47 50  Pulse 80 97 100       Physical Exam  She is older looking than her stated age.  Heart sounds are present without murmurs or added sounds.  Lung fields are entirely clear without any evidence of wheezing, crackles or bronchial breathing.  She is alert and orientated without any obvious focal neurological signs.     Assessment & Plan:    1. COPD (chronic obstructive pulmonary disease) with chronic bronchitis (HCC)   2. Stage 3 chronic kidney disease ([place])   3. Cigarette nicotine dependence without complication    1. She will continue with all medications for chronic conditions.  I do not think she has an acute exacerbation of her COPD at the present time so I do not  think any new medication is warranted.  She does not use any home oxygen. 2. Her renal function appears to be somewhat worse than the last time.  This will need to be monitored closely. 3. I counseled her regarding tobacco cessation again and spent at least 5 minutes doing so.  I do not think she is able to quit abruptly so we discussed a weaning protocol.  I hope she will try to do this.  I discussed the ramifications and complications of not doing so, diseases which she already has. 4. Follow-up in about 3 to 4 months time.   Doree Albee, MD

## 2018-11-26 ENCOUNTER — Other Ambulatory Visit: Payer: Self-pay | Admitting: Cardiology

## 2018-12-03 ENCOUNTER — Ambulatory Visit (INDEPENDENT_AMBULATORY_CARE_PROVIDER_SITE_OTHER): Payer: Medicare Other

## 2018-12-04 ENCOUNTER — Other Ambulatory Visit: Payer: Self-pay

## 2018-12-04 ENCOUNTER — Ambulatory Visit (INDEPENDENT_AMBULATORY_CARE_PROVIDER_SITE_OTHER): Payer: Medicare Other

## 2018-12-04 DIAGNOSIS — Z23 Encounter for immunization: Secondary | ICD-10-CM

## 2019-02-02 ENCOUNTER — Encounter (INDEPENDENT_AMBULATORY_CARE_PROVIDER_SITE_OTHER): Payer: Self-pay | Admitting: Internal Medicine

## 2019-02-02 ENCOUNTER — Other Ambulatory Visit: Payer: Self-pay

## 2019-02-02 ENCOUNTER — Ambulatory Visit (INDEPENDENT_AMBULATORY_CARE_PROVIDER_SITE_OTHER): Payer: Medicare Other | Admitting: Internal Medicine

## 2019-02-02 VITALS — BP 110/66 | HR 88 | Temp 98.8°F | Resp 18 | Ht 61.0 in | Wt 132.4 lb

## 2019-02-02 DIAGNOSIS — J4489 Other specified chronic obstructive pulmonary disease: Secondary | ICD-10-CM

## 2019-02-02 DIAGNOSIS — I10 Essential (primary) hypertension: Secondary | ICD-10-CM | POA: Diagnosis not present

## 2019-02-02 DIAGNOSIS — J449 Chronic obstructive pulmonary disease, unspecified: Secondary | ICD-10-CM | POA: Diagnosis not present

## 2019-02-02 DIAGNOSIS — F17210 Nicotine dependence, cigarettes, uncomplicated: Secondary | ICD-10-CM

## 2019-02-02 DIAGNOSIS — E559 Vitamin D deficiency, unspecified: Secondary | ICD-10-CM

## 2019-02-02 DIAGNOSIS — E782 Mixed hyperlipidemia: Secondary | ICD-10-CM | POA: Diagnosis not present

## 2019-02-02 DIAGNOSIS — N1831 Chronic kidney disease, stage 3a: Secondary | ICD-10-CM | POA: Diagnosis not present

## 2019-02-02 NOTE — Progress Notes (Signed)
Metrics: Intervention Frequency ACO  Documented Smoking Status Yearly  Screened one or more times in 24 months  Cessation Counseling or  Active cessation medication Past 24 months  Past 24 months   Guideline developer: UpToDate (See UpToDate for funding source) Date Released: 2014       Wellness Office Visit  Subjective:  Patient ID: Kaitlin [name], female    DOB: 1950-03-14  Age: 69 y.o. MRN: [id number]  CC: This lady comes in for follow-up of COPD, vitamin D deficiency, hypertension, chronic kidney disease. HPI  She is doing reasonably well.  She continues on lisinopril for hypertension.  She denies any chest pain, dyspnea, palpitations or limb weakness. She also continues with vitamin D3 supplementation for vitamin D deficiency. She continues on statin therapy for hyperlipidemia in the face of coronary artery disease. Her COPD is stable and unfortunately she continues to smoke cigarettes. Past Medical History:  Diagnosis Date  . AAA (abdominal aortic aneurysm) ([place])   . Arthritis   . CAD (coronary artery disease)    stents  . COPD (chronic obstructive pulmonary disease) ([place])   . Emphysema of lung ([place])   . GERD (gastroesophageal reflux disease)   . Hyperlipidemia   . Hypertension   . Iliac artery stenosis, right (HCC)    60%-70%  . Myocardial infarction ([place])   . Normocytic anemia 01/26/2017  . PAD (peripheral artery disease) ([place]) 12/14/2010   in the left vertebral, bilateral carotids      Family History  Problem Relation Age of Onset  . Heart attack Mother   . Hyperlipidemia Mother   . Hypertension Mother   . Diabetes Mother   . Heart attack Father   . Early death Father 28  . Hyperlipidemia Father   . Hypertension Father   . Heart disease Sister   . Hyperlipidemia Sister   . Hypertension Sister   . Diabetes Brother   . Heart disease Sister   . Hyperlipidemia Sister   . Hypertension Sister   . Heart attack Sister   . Heart disease Sister   .  Hyperlipidemia Sister   . Hypertension Sister   . Heart attack Brother   . Early death Brother 75  . Hyperlipidemia Brother   . Hypertension Brother   . Heart attack Brother   . Early death Brother 40  . Hyperlipidemia Brother   . Hypertension Brother   . Cancer Maternal Aunt   . Colon cancer Neg Hx   . Colon polyps Neg Hx     Social History   Social History Narrative   Retired   Has an [place] in [employer] with husband Marcello Moores for 23 years   Stays busy with home, gardens, likes to can   Social History   Tobacco Use  . Smoking status: Current Every Day Smoker    Packs/day: 1.00    Years: 49.00    Pack years: 49.00    Types: Cigarettes    Start date: 02/09/1967  . Smokeless tobacco: Never Used  Substance Use Topics  . Alcohol use: No    Current Meds  Medication Sig  . aspirin 81 MG chewable tablet Chew 81 mg by mouth daily.  . cholecalciferol (VITAMIN D) 1000 units tablet Take 2 Units by mouth daily.   . clopidogrel (PLAVIX) 75 MG tablet TAKE ONE (1) TABLET BY MOUTH EVERY DAY  . Cyanocobalamin (VITAMIN B 12 PO) Take 1 tablet by mouth daily.   . ferrous sulfate 325 (65 FE) MG  tablet Take 325 mg by mouth daily with breakfast.  . lisinopril (ZESTRIL) 2.5 MG tablet TAKE ONE TABLET ONCE DAILY  . NITROSTAT 0.4 MG SL tablet PLACE 1 TABLET UNDER TONGUE EVERY 5 MINUTES FOR 3 DOSES AS NEEDED.  Marland Kitchen rosuvastatin (CRESTOR) 40 MG tablet Take 40 mg by mouth daily.     Objective:   Today's Vitals: BP 110/66 (BP Location: Left Arm, Patient Position: Sitting, Cuff Size: Normal)   Pulse 88   Temp 98.8 F (37.1 C) (Temporal)   Resp 18   Ht _0  (1.549 m)   Wt 132 lb 6.4 oz (60.1 kg)   SpO2 98% Comment: wearing mask.  BMI 25.02 kg/m  Vitals with BMI 02/02/2019 10/01/2018 09/12/2018  Height _1  _2  -  Weight 132 lbs 6 oz 128 lbs 6 oz 128 lbs 10 oz  BMI 60.73 71.06 -  Systolic 269 485 462  Diastolic 66 72 47  Pulse 88 80 97     Physical Exam  She looks systemically well.   Blood pressure is well controlled.  Heart sounds are present without murmurs.  Lung fields are entirely clear with no evidence of wheezing, crackles or bronchial breathing.     Assessment   1. COPD (chronic obstructive pulmonary disease) with chronic bronchitis (HCC)   2. Stage 3a chronic kidney disease   3. Essential hypertension   4. Mixed hyperlipidemia   5. Cigarette nicotine dependence without complication   6. Vitamin D deficiency disease       Tests ordered Orders Placed This Encounter  Procedures  . CBC  . CMP with eGFR([facility])  . Lipid Panel  . Vitamin D, 25-hydroxy     Plan: 1. She will continue with antihypertensive meds medication and her blood pressure is well controlled on this. 2. She will continue with statin therapy and we will check blood levels. 3. She will continue vitamin D3 supplementation and we will check blood levels today. 4. She was anemic in the past but the last time this was checked, her hemoglobin was normal.  I will check it again. 5. Further recommendations will depend on blood results and I will have her follow-up in 3 months with Judson Roch for an annual Medicare wellness visit.   No orders of the defined types were placed in this encounter.   Doree Albee, MD

## 2019-02-03 LAB — COMPLETE METABOLIC PANEL WITH GFR
AG Ratio: 1.7 (calc) (ref 1.0–2.5)
ALT: 9 U/L (ref 6–29)
AST: 12 U/L (ref 10–35)
Albumin: 4.3 g/dL (ref 3.6–5.1)
Alkaline phosphatase (APISO): 82 U/L (ref 37–153)
BUN/Creatinine Ratio: 13 (calc) (ref 6–22)
BUN: 22 mg/dL (ref 7–25)
CO2: 22 mmol/L (ref 20–32)
Calcium: 9.5 mg/dL (ref 8.6–10.4)
Chloride: 109 mmol/L (ref 98–110)
Creat: 1.7 mg/dL — ABNORMAL HIGH (ref 0.50–0.99)
GFR, Est African American: 36 mL/min/{1.73_m2} — ABNORMAL LOW (ref >=60)
GFR, Est Non African American: 31 mL/min/{1.73_m2} — ABNORMAL LOW (ref >=60)
Globulin: 2.5 g/dL (calc) (ref 1.9–3.7)
Glucose, Bld: 85 mg/dL (ref 65–99)
Potassium: 4.1 mmol/L (ref 3.5–5.3)
Sodium: 144 mmol/L (ref 135–146)
Total Bilirubin: 0.3 mg/dL (ref 0.2–1.2)
Total Protein: 6.8 g/dL (ref 6.1–8.1)

## 2019-02-03 LAB — CBC
HCT: 34.9 % — ABNORMAL LOW (ref 35.0–45.0)
Hemoglobin: 11.6 g/dL — ABNORMAL LOW (ref 11.7–15.5)
MCH: 32.4 pg (ref 27.0–33.0)
MCHC: 33.2 g/dL (ref 32.0–36.0)
MCV: 97.5 fL (ref 80.0–100.0)
MPV: 10.4 fL (ref 7.5–12.5)
Platelets: 201 10*3/uL (ref 140–400)
RBC: 3.58 10*6/uL — ABNORMAL LOW (ref 3.80–5.10)
RDW: 13 % (ref 11.0–15.0)
WBC: 6.6 10*3/uL (ref 3.8–10.8)

## 2019-02-03 LAB — LIPID PANEL
Cholesterol: 136 mg/dL (ref <200)
HDL: 44 mg/dL — ABNORMAL LOW (ref >=50)
LDL Cholesterol (Calc): 54 mg/dL (calc)
Non-HDL Cholesterol (Calc): 92 mg/dL (calc) (ref <130)
Total CHOL/HDL Ratio: 3.1 (calc) (ref <5.0)
Triglycerides: 361 mg/dL — ABNORMAL HIGH (ref <150)

## 2019-02-03 LAB — VITAMIN D 25 HYDROXY (VIT D DEFICIENCY, FRACTURES): Vit D, 25-Hydroxy: 70 ng/mL (ref 30–100)

## 2019-03-11 ENCOUNTER — Other Ambulatory Visit (HOSPITAL_COMMUNITY): Payer: Self-pay

## 2019-03-11 DIAGNOSIS — D649 Anemia, unspecified: Secondary | ICD-10-CM

## 2019-03-12 ENCOUNTER — Inpatient Hospital Stay (HOSPITAL_COMMUNITY): Payer: Medicare Other

## 2019-03-17 ENCOUNTER — Other Ambulatory Visit: Payer: Self-pay | Admitting: Cardiology

## 2019-03-19 ENCOUNTER — Ambulatory Visit (HOSPITAL_COMMUNITY): Payer: Medicare Other | Admitting: Nurse Practitioner

## 2019-03-25 DIAGNOSIS — Z23 Encounter for immunization: Secondary | ICD-10-CM | POA: Diagnosis not present

## 2019-04-22 DIAGNOSIS — Z23 Encounter for immunization: Secondary | ICD-10-CM | POA: Diagnosis not present

## 2019-05-11 ENCOUNTER — Other Ambulatory Visit: Payer: Self-pay

## 2019-05-11 ENCOUNTER — Ambulatory Visit (INDEPENDENT_AMBULATORY_CARE_PROVIDER_SITE_OTHER): Payer: Medicare Other | Admitting: Nurse Practitioner

## 2019-05-11 ENCOUNTER — Encounter (INDEPENDENT_AMBULATORY_CARE_PROVIDER_SITE_OTHER): Payer: Self-pay | Admitting: Nurse Practitioner

## 2019-05-11 VITALS — BP 100/65 | HR 108 | Temp 97.5°F | Ht 61.0 in | Wt 132.0 lb

## 2019-05-11 DIAGNOSIS — Z0000 Encounter for general adult medical examination without abnormal findings: Secondary | ICD-10-CM | POA: Diagnosis not present

## 2019-05-11 DIAGNOSIS — Z1231 Encounter for screening mammogram for malignant neoplasm of breast: Secondary | ICD-10-CM

## 2019-05-11 DIAGNOSIS — Z122 Encounter for screening for malignant neoplasm of respiratory organs: Secondary | ICD-10-CM

## 2019-05-11 NOTE — Progress Notes (Addendum)
Subjective:   RUDINE RIEGER is a 69 y.o. female who presents for Medicare Annual (Subsequent) preventive examination.  Review of Systems:   Cardiac Risk Factors include: none     Objective:     Vitals: BP 100/65 (BP Location: Left Arm, Patient Position: Sitting, Cuff Size: Normal)   Pulse (!) 108   Temp (!) 97.5 F (36.4 C) (Temporal)   Ht 5\' 1"  (1.549 m)   Wt 132 lb (59.9 kg)   SpO2 95%   BMI 24.94 kg/m   Body mass index is 24.94 kg/m.  Advanced Directives 05/11/2019 09/12/2018 03/14/2018 11/11/2017 08/14/2017 07/09/2017 05/07/2017  Does Patient Have a Medical Advance Directive? No No No No No No No  Would patient like information on creating a medical advance directive? No - Patient declined No - Patient declined No - Patient declined No - Patient declined No - Patient declined No - Patient declined No - Patient declined    Tobacco Social History   Tobacco Use  Smoking Status Current Every Day Smoker  . Packs/day: 1.00  . Years: 49.00  . Pack years: 49.00  . Types: Cigarettes  . Start date: 02/09/1967  Smokeless Tobacco Never Used     Ready to quit: Not Answered Counseling given: Not Answered   Clinical Intake:  Pre-visit preparation completed: Yes  Pain : No/denies pain     Nutritional Status: BMI 25 -29 Overweight Nutritional Risks: None Diabetes: No  How often do you need to have someone help you when you read instructions, pamphlets, or other written materials from your doctor or pharmacy?: 1 - Never What is the last grade level you completed in school?: 2 years of college  Interpreter Needed?: No  Information entered by :: Awilda Bill, [place]  Past Medical History:  Diagnosis Date  . AAA (abdominal aortic aneurysm) ([place])   . Arthritis   . Asthma   . CAD (coronary artery disease)    stents  . COPD (chronic obstructive pulmonary disease) ([place])   . Emphysema of lung ([place])   . GERD (gastroesophageal reflux disease)   . Hyperlipidemia   .  Hypertension   . Iliac artery stenosis, right (HCC)    60%-70%  . Myocardial infarction ([place])   . Normocytic anemia 01/26/2017  . PAD (peripheral artery disease) ([place]) 12/14/2010   in the left vertebral, bilateral carotids   Past Surgical History:  Procedure Laterality Date  . CARDIAC CATHETERIZATION  02/04/2005   A cypher 5.3 x 18 mm at 16 atmosphere of pressure in the mid LAD, this stent covered the proximal part of the LAD and also the mid LAD, this was then post dilated with 3.25 x 15 mm Quantum at 16 atmosphereof pressure for 45 seconds  . CATARACT EXTRACTION Bilateral   . CHOLECYSTECTOMY N/A 08/16/2017   Procedure: LAPAROSCOPIC CHOLECYSTECTOMY;  Surgeon: Aviva Signs, MD;  Location: [facility];  Service: General;  Laterality: N/A;  . COLONOSCOPY N/A 03/01/2017   Procedure: COLONOSCOPY;  Surgeon: Danie Binder, MD;  Location: [facility];  Service: Endoscopy;  Laterality: N/A;  1:00pm  . ESOPHAGOGASTRODUODENOSCOPY N/A 01/28/2017   Procedure: ESOPHAGOGASTRODUODENOSCOPY (EGD);  Surgeon: Jerene Bears, MD;  Location: [facility] ENDOSCOPY;  Service: Gastroenterology;  Laterality: N/A;  . GIVENS CAPSULE STUDY N/A 03/12/2017   Procedure: GIVENS CAPSULE STUDY;  Surgeon: Danie Binder, MD;  Location: [facility];  Service: Endoscopy;  Laterality: N/A;  7:30am  . TUBAL LIGATION     Family History  Problem Relation Age  of Onset  . Heart attack Mother   . Hyperlipidemia Mother   . Hypertension Mother   . Diabetes Mother   . Heart attack Father   . Early death Father 69  . Hyperlipidemia Father   . Hypertension Father   . Heart disease Sister   . Hyperlipidemia Sister   . Hypertension Sister   . Diabetes Brother   . Heart disease Sister   . Hyperlipidemia Sister   . Hypertension Sister   . Heart attack Sister   . Heart disease Sister   . Hyperlipidemia Sister   . Hypertension Sister   . Heart attack Brother   . Early death Brother 68  . Hyperlipidemia Brother   . Hypertension Brother    . Heart attack Brother   . Early death Brother 6  . Hyperlipidemia Brother   . Hypertension Brother   . Cancer Maternal Aunt   . Colon cancer Neg Hx   . Colon polyps Neg Hx    Social History   Socioeconomic History  . Marital status: Married    Spouse name: Marcello Moores  . Number of children: 3  . Years of education: 52  . Highest education level: Not on file  Occupational History  . Occupation: RETIRED    Comment: warehouse/textiles  Tobacco Use  . Smoking status: Current Every Day Smoker    Packs/day: 1.00    Years: 49.00    Pack years: 49.00    Types: Cigarettes    Start date: 02/09/1967  . Smokeless tobacco: Never Used  Substance and Sexual Activity  . Alcohol use: No  . Drug use: No  . Sexual activity: Yes    Birth control/protection: Post-menopausal  Other Topics Concern  . Not on file  Social History Narrative   Retired   Has an [employer] in [employer] with husband Marcello Moores for 23 years   Stays busy with home, gardens, likes to can   Social Determinants of Radio broadcast assistant Strain:   . Difficulty of Paying Living Expenses:   Food Insecurity:   . Worried About Charity fundraiser in the Last Year:   . Arboriculturist in the Last Year:   Transportation Needs:   . Film/video editor (Medical):   Marland Kitchen Lack of Transportation (Non-Medical):   Physical Activity:   . Days of Exercise per Week:   . Minutes of Exercise per Session:   Stress:   . Feeling of Stress :   Social Connections:   . Frequency of Communication with Friends and Family:   . Frequency of Social Gatherings with Friends and Family:   . Attends Religious Services:   . Active Member of Clubs or Organizations:   . Attends Archivist Meetings:   Marland Kitchen Marital Status:     Outpatient Encounter Medications as of 05/11/2019  Medication Sig  . aspirin 81 MG chewable tablet Chew 81 mg by mouth daily.  . cholecalciferol (VITAMIN D) 1000 units tablet Take 2 Units by mouth daily.   .  clopidogrel (PLAVIX) 75 MG tablet TAKE ONE (1) TABLET BY MOUTH EVERY DAY  . Cyanocobalamin (VITAMIN B 12 PO) Take 1 tablet by mouth daily.   . ferrous sulfate 325 (65 FE) MG tablet Take 325 mg by mouth daily with breakfast.  . lisinopril (ZESTRIL) 2.5 MG tablet TAKE ONE TABLET ONCE DAILY  . NITROSTAT 0.4 MG SL tablet PLACE 1 TABLET UNDER TONGUE EVERY 5 MINUTES FOR 3 DOSES AS NEEDED.  Marland Kitchen  Probiotic Product (PROBIOTIC-10 PO) Take 10 mg by mouth daily.  . rosuvastatin (CRESTOR) 40 MG tablet TAKE ONE TABLET BY MOUTH ONCE DAILY   No facility-administered encounter medications on file as of 05/11/2019.    Activities of Daily Living In your present state of health, do you have any difficulty performing the following activities: 05/11/2019  Hearing? N  Vision? Y  Comment occasional  Difficulty concentrating or making decisions? N  Walking or climbing stairs? N  Dressing or bathing? N  Doing errands, shopping? N  Preparing Food and eating ? N  Using the Toilet? N  In the past six months, have you accidently leaked urine? N  Do you have problems with loss of bowel control? N  Managing your Medications? N  Managing your Finances? N  Housekeeping or managing your Housekeeping? N  Some recent data might be hidden    Patient Care Team: Doree Albee, MD as PCP - General (Internal Medicine) Harl Bowie Alphonse Guild, MD as PCP - Cardiology (Cardiology) Harl Bowie Alphonse Guild, MD as Consulting Physician (Cardiology) Danie Binder, MD as Consulting Physician (Gastroenterology)    Assessment:   This is a routine wellness examination for Celie.  Exercise Activities and Dietary recommendations Current Exercise Habits: The patient does not participate in regular exercise at present, Exercise limited by: None identified  Goals    . STOP SMOKING       Fall Risk Fall Risk  05/11/2019 02/02/2019 02/04/2017 10/25/2016  Falls in the past year? 0 1 No No  Number falls in past yr: 0 0 - -  Injury with Fall? 0  0 - -  Comment - fell in bath room; not injury. Just felt stupid for not cutting light on. - -  Risk for fall due to : No Fall Risks - - -  Follow up Falls evaluation completed Falls evaluation completed - -   Is the patient's home free of loose throw rugs in walkways, pet beds, electrical cords, etc?   yes      Grab bars in the bathroom? no      Handrails on the stairs?   no      Adequate lighting?   yes  Timed Get Up and Go performed: 6 seconds  Depression Screen PHQ 2/9 Scores 05/11/2019 02/02/2019 02/04/2017 10/25/2016  PHQ - 2 Score 0 0 0 0  Exception Documentation Medical reason Medical reason - -     Cognitive Function     6CIT Screen 05/11/2019  What Year? 0 points  What month? 0 points  What time? 0 points  Count back from 20 0 points  Months in reverse 0 points  Repeat phrase 0 points  Total Score 0    Immunization History  Administered Date(s) Administered  . Influenza,inj,Quad PF,6+ Mos 10/25/2016, 12/04/2018    Qualifies for Shingles Vaccine? Yes  Screening Tests Health Maintenance  Topic Date Due  . Hepatitis C Screening  Never done  . TETANUS/TDAP  Never done  . MAMMOGRAM  Never done  . DEXA SCAN  10/25/2016  . PNA vac Low Risk Adult (1 of 2 - PCV13) Never done  . INFLUENZA VACCINE  08/30/2019  . COLONOSCOPY  03/02/2027    Cancer Screenings: Lung: Low Dose CT Chest recommended if Age 68-80 years, 30 pack-year currently smoking OR have quit w/in 15years. Patient does qualify. Breast:  Up to date on Mammogram? No   Up to date of Bone Density/Dexa? No Colorectal: Up-to-date currently  Additional Screenings: Hepatitis C  Screening: patient declined     Plan:   Patient is not sure of exact date of last tetanus shot.  She thinks it was within the last 8 years.  She will be most likely due for booster in 2023, but was told to call the office if she experiences a penetrating wound at which time we will give her a booster.  She is due for PPSV23, she would  like to hold off until later in the year to have this administered.  She believes she has had the shingles vaccine administered in the past, she is not sure which was administered or the date.  Per chart review she will be due for colon cancer screening again anytime but no later than 2022, she would like to hold off on this for now.  She does not want to go through colonoscopy, but would be willing to discuss undergoing FIT for screening.  She is not ready to quit smoking at this time, she was encouraged to reduce her intake as much as possible.  Current pack year is approximately 75, she would qualify for lung cancer screening.  I did discuss lung cancer screening via CT scan and the risks and benefits of the screening.  She has elected to undergo this screening.  I will send an order for this.  She is also due for breast cancer screening via mammography.  I will send an order for this as well.  Depression screen was negative today.  Falls risk was low.  She declined to have hepatitis C screening completed today.  She would like to also hold off on osteoporosis screening via DEXA scan for now.  I have personally reviewed and noted the following in the patient's chart:   . Medical and social history . Use of alcohol, tobacco or illicit drugs  . Current medications and supplements . Functional ability and status . Nutritional status . Physical activity . Advanced directives . List of other physicians . Hospitalizations, surgeries, and ER visits in previous 12 months . Vitals . Screenings to include cognitive, depression, and falls . Referrals and appointments  In addition, I have reviewed and discussed with patient certain preventive protocols, quality metrics, and best practice recommendations. A written personalized care plan for preventive services as well as general preventive health recommendations were provided to patient.     She will follow up in approximately 3 months with Dr.  Anastasio Champion, but was encouraged to call the office if she has any questions or concerns prior to that upcoming appointment.  Jeralyn Ruths, NP-C  05/11/2019

## 2019-05-11 NOTE — Patient Instructions (Signed)
  Ms. Neuroth , Thank you for taking time to come for your Medicare Wellness Visit. I appreciate your ongoing commitment to your health goals. Please review the following plan we discussed and let me know if I can assist you in the future.   These are the goals we discussed: Goals    . STOP SMOKING       This is a list of the screening recommended for you and due dates:  Health Maintenance  Topic Date Due  . Mammogram  Never done  . DEXA scan (bone density measurement)  05/10/2020*  .  Hepatitis C: One time screening is recommended by Center for Disease Control  (CDC) for  adults born from [birth year] through 1964.   05/10/2020*  . Pneumonia vaccines (1 of 2 - PCV13) 05/10/2020*  . Flu Shot  08/30/2019  . Tetanus Vaccine  01/29/2021  . Colon Cancer Screening  03/02/2027  *Topic was postponed. The date shown is not the original due date.

## 2019-05-12 ENCOUNTER — Telehealth (INDEPENDENT_AMBULATORY_CARE_PROVIDER_SITE_OTHER): Payer: Self-pay

## 2019-05-12 NOTE — Telephone Encounter (Signed)
Pts daughter  LVM that she Traveion Ruddock need bloodwork, she is feeling dizzy

## 2019-05-13 ENCOUNTER — Telehealth (INDEPENDENT_AMBULATORY_CARE_PROVIDER_SITE_OTHER): Payer: Self-pay

## 2019-05-13 NOTE — Addendum Note (Signed)
Addended by: Jeralyn Ruths E on: 05/13/2019 04:09 PM   Modules accepted: Orders

## 2019-05-14 NOTE — Telephone Encounter (Signed)
Setting appt to the

## 2019-05-18 ENCOUNTER — Ambulatory Visit (HOSPITAL_COMMUNITY): Payer: Medicare Other

## 2019-05-20 ENCOUNTER — Other Ambulatory Visit: Payer: Self-pay | Admitting: Cardiology

## 2019-05-21 ENCOUNTER — Other Ambulatory Visit: Payer: Self-pay

## 2019-05-21 ENCOUNTER — Other Ambulatory Visit (INDEPENDENT_AMBULATORY_CARE_PROVIDER_SITE_OTHER): Payer: Self-pay

## 2019-05-21 ENCOUNTER — Other Ambulatory Visit (INDEPENDENT_AMBULATORY_CARE_PROVIDER_SITE_OTHER): Payer: Self-pay | Admitting: Nurse Practitioner

## 2019-05-21 ENCOUNTER — Other Ambulatory Visit (HOSPITAL_COMMUNITY): Payer: Self-pay | Admitting: Internal Medicine

## 2019-05-21 ENCOUNTER — Ambulatory Visit (HOSPITAL_COMMUNITY)
Admission: RE | Admit: 2019-05-21 | Discharge: 2019-05-21 | Disposition: A | Discharge status: Home / Self Care | Payer: Medicare Other | Source: Ambulatory Visit | Attending: Nurse Practitioner | Admitting: Nurse Practitioner

## 2019-05-21 DIAGNOSIS — F17210 Nicotine dependence, cigarettes, uncomplicated: Secondary | ICD-10-CM | POA: Diagnosis not present

## 2019-05-21 DIAGNOSIS — Z122 Encounter for screening for malignant neoplasm of respiratory organs: Secondary | ICD-10-CM

## 2019-05-26 ENCOUNTER — Telehealth (INDEPENDENT_AMBULATORY_CARE_PROVIDER_SITE_OTHER): Payer: Self-pay | Admitting: Internal Medicine

## 2019-05-26 NOTE — Telephone Encounter (Signed)
Pt was called and given results of the CT scan. She was glad smoking did not hurt too much.

## 2019-05-26 NOTE — Telephone Encounter (Signed)
Please call this patient.  It looks like she is looking for the CT chest scan result and I have sent her a my chart message telling her about the result.  The CT scan is normal with no evidence of cancer.

## 2019-05-27 ENCOUNTER — Other Ambulatory Visit (HOSPITAL_COMMUNITY): Payer: Self-pay | Admitting: Nurse Practitioner

## 2019-05-27 ENCOUNTER — Other Ambulatory Visit: Payer: Self-pay

## 2019-05-27 ENCOUNTER — Ambulatory Visit (HOSPITAL_COMMUNITY)
Admission: RE | Admit: 2019-05-27 | Discharge: 2019-05-27 | Disposition: A | Discharge status: Home / Self Care | Payer: Medicare Other | Source: Ambulatory Visit | Attending: Nurse Practitioner | Admitting: Nurse Practitioner

## 2019-05-27 DIAGNOSIS — Z1231 Encounter for screening mammogram for malignant neoplasm of breast: Secondary | ICD-10-CM | POA: Diagnosis not present

## 2019-05-27 DIAGNOSIS — R928 Other abnormal and inconclusive findings on diagnostic imaging of breast: Secondary | ICD-10-CM

## 2019-05-29 ENCOUNTER — Other Ambulatory Visit: Payer: Self-pay | Admitting: Cardiology

## 2019-06-08 ENCOUNTER — Ambulatory Visit (INDEPENDENT_AMBULATORY_CARE_PROVIDER_SITE_OTHER): Payer: Medicare Other | Admitting: Cardiology

## 2019-06-08 ENCOUNTER — Encounter: Payer: Self-pay | Admitting: Cardiology

## 2019-06-08 ENCOUNTER — Other Ambulatory Visit: Payer: Self-pay

## 2019-06-08 VITALS — BP 96/44 | HR 100 | Ht 61.0 in | Wt 133.0 lb

## 2019-06-08 DIAGNOSIS — E782 Mixed hyperlipidemia: Secondary | ICD-10-CM | POA: Diagnosis not present

## 2019-06-08 DIAGNOSIS — I6523 Occlusion and stenosis of bilateral carotid arteries: Secondary | ICD-10-CM

## 2019-06-08 DIAGNOSIS — I10 Essential (primary) hypertension: Secondary | ICD-10-CM | POA: Diagnosis not present

## 2019-06-08 DIAGNOSIS — I2510 Atherosclerotic heart disease of native coronary artery without angina pectoris: Secondary | ICD-10-CM

## 2019-06-08 NOTE — Patient Instructions (Signed)
Your physician wants you to follow-up in: [place] will receive a reminder letter in the mail two months in advance. If you don't receive a letter, please call our office to schedule the follow-up appointment.  Your physician recommends that you continue on your current medications as directed. Please refer to the Current Medication list given to you today.  Your physician has requested that you have a carotid duplex. This test is an ultrasound of the carotid arteries in your neck. It looks at blood flow through these arteries that supply the brain with blood. Allow one hour for this exam. There are no restrictions or special instructions.  Thank you for choosing [facility]!!

## 2019-06-08 NOTE — Progress Notes (Signed)
Clinical Summary Kaitlin [name] is a 69 y.o.female  seen today for follow up of the following medical problems.   1. CAD  - w/ DES to LAD and BMSx2 to RCA Jan 2007, reported normal LV function from notes.  - the RCA PCI was complicated by dissection which was also stented   - was temporaily off ASA and plavix due to severe anemia requiring transfusion. Extensive GI workup without clear source of bleeding - back on ASA and plavix now.   - no recent chest pain. No SOB/DOE  - compliant with meds. No recent bleeding on DAPT.   2. PAD  - 60-70% right external iliac artery stenosis by previous cath  - denies any claudication.    3. Carotid stenosis  - prior right carotid endarterectomy   - stable follow up US 09/2017. - no neuro symptoms     4. Hyperlipidemia  -she is compliant with statin  - Jan 2021 TC 136 HDL 44 TG 361 LDL 54  5. AAA 07/2014 small AAA 3 x 3.2 cm. - 12/2016 3.2 cm and stable.  08/2018 AAA 3.5 cm  6. Anemia - previously severe as low as 6.7, required transfusion - followed by hematology. From notes thought to be related to CKD - GI workup with EGD showed chronic gastritis, colonscopy no clear bleeding source.      SH: just retired from [employer]. Spends most of time with great grandchildren (9 months, 2.5 years). Another is due in August.     Past Medical History:  Diagnosis Date  . AAA (abdominal aortic aneurysm) ([place])   . Arthritis   . Asthma   . CAD (coronary artery disease)    stents  . COPD (chronic obstructive pulmonary disease) ([place])   . Emphysema of lung ([place])   . GERD (gastroesophageal reflux disease)   . Hyperlipidemia   . Hypertension   . Iliac artery stenosis, right (HCC)    60%-70%  . Myocardial infarction ([place])   . Normocytic anemia 01/26/2017  . PAD (peripheral artery disease) ([place]) 12/14/2010   in the left vertebral, bilateral carotids     No Known Allergies   Current  Outpatient Medications  Medication Sig Dispense Refill  . aspirin 81 MG chewable tablet Chew 81 mg by mouth daily.    . cholecalciferol (VITAMIN D) 1000 units tablet Take 2 Units by mouth daily.     . clopidogrel (PLAVIX) 75 MG tablet TAKE ONE (1) TABLET BY MOUTH EVERY DAY 90 tablet 3  . Cyanocobalamin (VITAMIN B 12 PO) Take 1 tablet by mouth daily.     . ferrous sulfate 325 (65 FE) MG tablet Take 325 mg by mouth daily with breakfast.    . lisinopril (ZESTRIL) 2.5 MG tablet TAKE ONE TABLET ONCE DAILY 90 tablet 3  . NITROSTAT 0.4 MG SL tablet PLACE 1 TABLET UNDER TONGUE EVERY 5 MINUTES FOR 3 DOSES AS NEEDED. 25 tablet 3  . Probiotic Product (PROBIOTIC-10 PO) Take 10 mg by mouth daily.    . rosuvastatin (CRESTOR) 40 MG tablet TAKE ONE TABLET BY MOUTH ONCE DAILY 90 tablet 3   No current facility-administered medications for this visit.     Past Surgical History:  Procedure Laterality Date  . CARDIAC CATHETERIZATION  02/04/2005   A cypher 5.3 x 18 mm at 16 atmosphere of pressure in the mid LAD, this stent covered the proximal part of the LAD and also the mid LAD, this was then post dilated with 3.25 x  15 mm Quantum at 16 atmosphereof pressure for 45 seconds  . CATARACT EXTRACTION Bilateral   . CHOLECYSTECTOMY N/A 08/16/2017   Procedure: LAPAROSCOPIC CHOLECYSTECTOMY;  Surgeon: Aviva Signs, MD;  Location: [facility] ORS;  Service: General;  Laterality: N/A;  . COLONOSCOPY N/A 03/01/2017   Procedure: COLONOSCOPY;  Surgeon: Danie Binder, MD;  Location[facility] END[facility];  Service: Endoscopy;  Laterality: N/A;  1:00pm  . ESOPHAGOGASTRODUODENOSCOPY N/A 01/28/2017   Procedure: ESOPHAGOGASTRODUODENOSCOPY (EGD);  Surgeon: Jerene Bears, MD;  Location: [facility] ENDOSCOPY;  Service: Gastroenterology;  Laterality: N/A;  . GIVENS CAPSULE STUDY N/A 03/12/2017   Procedure: GIVENS CAPSULE STUDY;  Surgeon: Danie Binder, MD;  Location: [facility];  Service: Endoscopy;  Laterality: N/A;  7:30am  . TUBAL LIGATION        No Known Allergies    Family History  Problem Relation Age of Onset  . Heart attack Mother   . Hyperlipidemia Mother   . Hypertension Mother   . Diabetes Mother   . Heart attack Father   . Early death Father 47  . Hyperlipidemia Father   . Hypertension Father   . Heart disease Sister   . Hyperlipidemia Sister   . Hypertension Sister   . Diabetes Brother   . Heart disease Sister   . Hyperlipidemia Sister   . Hypertension Sister   . Heart attack Sister   . Heart disease Sister   . Hyperlipidemia Sister   . Hypertension Sister   . Heart attack Brother   . Early death Brother 34  . Hyperlipidemia Brother   . Hypertension Brother   . Heart attack Brother   . Early death Brother 53  . Hyperlipidemia Brother   . Hypertension Brother   . Cancer Maternal Aunt   . Colon cancer Neg Hx   . Colon polyps Neg Hx      Social History Ms. Kuehl reports that she has been smoking cigarettes. She started smoking about 52 years ago. She has a 50.00 pack-year smoking history. She has never used smokeless tobacco. Ms. Waskey reports no history of alcohol use.   Review of Systems CONSTITUTIONAL: No weight loss, fever, chills, weakness or fatigue.  HEENT: Eyes: No visual loss, blurred vision, double vision or yellow sclerae.No hearing loss, sneezing, congestion, runny nose or sore throat.  SKIN: No rash or itching.  CARDIOVASCULAR: per hpi RESPIRATORY: No shortness of breath, cough or sputum.  GASTROINTESTINAL: No anorexia, nausea, vomiting or diarrhea. No abdominal pain or blood.  GENITOURINARY: No burning on urination, no polyuria NEUROLOGICAL: No headache, dizziness, syncope, paralysis, ataxia, numbness or tingling in the extremities. No change in bowel or bladder control.  MUSCULOSKELETAL: No muscle, back pain, joint pain or stiffness.  LYMPHATICS: No enlarged nodes. No history of splenectomy.  PSYCHIATRIC: No history of depression or anxiety.  ENDOCRINOLOGIC: No reports  of sweating, cold or heat intolerance. No polyuria or polydipsia.  Marland Kitchen   Physical Examination Vitals:   06/08/19 1556  BP: (!) 96/44  Pulse: 100  SpO2: 97%   Filed Weights   06/08/19 1556  Weight: 133 lb (60.3 kg)    Gen: resting comfortably, no acute distress HEENT: no scleral icterus, pupils equal round and reactive, no palptable cervical adenopathy,  CV: RRR, no m/r/g, no jvd Resp: Clear to auscultation bilaterally GI: abdomen is soft, non-tender, non-distended, normal bowel sounds, no hepatosplenomegaly MSK: extremities are warm, no edema.  Skin: warm, no rash Neuro:  no focal deficits Psych: appropriate affect   Diagnostic  Studies     Assessment and Plan   1. CAD -Given her history of complex RCA PCI as well as carotid and PAD would favor continuing DAPT -no symptoms, continue current meds  2. HTN - manual recheck bp 110/60 at goal, cotinue current meds  3. Hyperlipidemia - continue statin, LDL at goal, Discussed dietary modifications to improve TGs  4. Carotid stenosis - recheck carotid US  5. AAA - mild by [place] last year, continue to montor  F/u 1 year     Arnoldo Lenis, M.D.

## 2019-06-16 ENCOUNTER — Other Ambulatory Visit: Payer: Self-pay

## 2019-06-16 ENCOUNTER — Ambulatory Visit (HOSPITAL_COMMUNITY)
Admission: RE | Admit: 2019-06-16 | Discharge: 2019-06-16 | Disposition: A | Discharge status: Home / Self Care | Payer: Medicare Other | Source: Ambulatory Visit | Attending: Cardiology | Admitting: Cardiology

## 2019-06-16 ENCOUNTER — Ambulatory Visit (HOSPITAL_COMMUNITY)
Admission: RE | Admit: 2019-06-16 | Discharge: 2019-06-16 | Disposition: A | Discharge status: Home / Self Care | Payer: Medicare Other | Source: Ambulatory Visit | Attending: Nurse Practitioner | Admitting: Nurse Practitioner

## 2019-06-16 DIAGNOSIS — N6489 Other specified disorders of breast: Secondary | ICD-10-CM | POA: Diagnosis not present

## 2019-06-16 DIAGNOSIS — I6522 Occlusion and stenosis of left carotid artery: Secondary | ICD-10-CM | POA: Diagnosis not present

## 2019-06-16 DIAGNOSIS — R928 Other abnormal and inconclusive findings on diagnostic imaging of breast: Secondary | ICD-10-CM | POA: Insufficient documentation

## 2019-06-16 DIAGNOSIS — I6523 Occlusion and stenosis of bilateral carotid arteries: Secondary | ICD-10-CM

## 2019-06-17 ENCOUNTER — Ambulatory Visit (HOSPITAL_COMMUNITY): Payer: Medicare Other

## 2019-06-18 ENCOUNTER — Telehealth: Payer: Self-pay | Admitting: *Deleted

## 2019-06-18 NOTE — Telephone Encounter (Signed)
-----   Message from Arnoldo Lenis, MD sent at 06/18/2019  3:52 PM EDT ----- Carotid US looks good, just mild plaque without significant blockages   Zandra Abts MD

## 2019-06-18 NOTE — Telephone Encounter (Signed)
Pt aware - routed to pcp  

## 2019-08-13 ENCOUNTER — Ambulatory Visit (INDEPENDENT_AMBULATORY_CARE_PROVIDER_SITE_OTHER): Payer: Medicare Other | Admitting: Internal Medicine

## 2019-08-13 ENCOUNTER — Encounter (INDEPENDENT_AMBULATORY_CARE_PROVIDER_SITE_OTHER): Payer: Self-pay | Admitting: Internal Medicine

## 2019-08-13 ENCOUNTER — Other Ambulatory Visit: Payer: Self-pay

## 2019-08-13 VITALS — BP 120/69 | HR 89 | Temp 97.7°F | Ht 61.0 in | Wt 131.8 lb

## 2019-08-13 DIAGNOSIS — E782 Mixed hyperlipidemia: Secondary | ICD-10-CM

## 2019-08-13 DIAGNOSIS — I6523 Occlusion and stenosis of bilateral carotid arteries: Secondary | ICD-10-CM

## 2019-08-13 DIAGNOSIS — N1831 Chronic kidney disease, stage 3a: Secondary | ICD-10-CM

## 2019-08-13 DIAGNOSIS — J449 Chronic obstructive pulmonary disease, unspecified: Secondary | ICD-10-CM

## 2019-08-13 DIAGNOSIS — I10 Essential (primary) hypertension: Secondary | ICD-10-CM

## 2019-08-13 DIAGNOSIS — R6889 Other general symptoms and signs: Secondary | ICD-10-CM | POA: Diagnosis not present

## 2019-08-13 DIAGNOSIS — D649 Anemia, unspecified: Secondary | ICD-10-CM | POA: Diagnosis not present

## 2019-08-13 NOTE — Progress Notes (Signed)
Metrics: Intervention Frequency ACO  Documented Smoking Status Yearly  Screened one or more times in 24 months  Cessation Counseling or  Active cessation medication Past 24 months  Past 24 months   Guideline developer: UpToDate (See UpToDate for funding source) Date Released: 2014       Wellness Office Visit  Subjective:  Patient ID: Kaitlin [name], female    DOB: Jun 17, 1950  Age: 69 y.o. MRN: [id number]  CC: This lady comes in for follow-up of hypertension, COPD, chronic kidney disease, anemia. HPI  She is being followed by cardiology also.  Things appear to be stable.  She denies any cardiac symptoms at the present time. She does describe dyspnea on exertion which is likely due to her COPD and unfortunately she still continues to smoke cigarettes. She also was previously anemic, possibly related to chronic kidney disease but I do not see any hematinics ordered. She is compliant with lisinopril for hypertension. She takes Crestor for her hyperlipidemia and is tolerating it well. Past Medical History:  Diagnosis Date  . AAA (abdominal aortic aneurysm) ([place])   . Arthritis   . Asthma   . CAD (coronary artery disease)    stents  . COPD (chronic obstructive pulmonary disease) ([place])   . Emphysema of lung ([place])   . GERD (gastroesophageal reflux disease)   . Hyperlipidemia   . Hypertension   . Iliac artery stenosis, right (HCC)    60%-70%  . Myocardial infarction ([place])   . Normocytic anemia 01/26/2017  . PAD (peripheral artery disease) ([place]) 12/14/2010   in the left vertebral, bilateral carotids   Past Surgical History:  Procedure Laterality Date  . CARDIAC CATHETERIZATION  02/04/2005   A cypher 5.3 x 18 mm at 16 atmosphere of pressure in the mid LAD, this stent covered the proximal part of the LAD and also the mid LAD, this was then post dilated with 3.25 x 15 mm Quantum at 16 atmosphereof pressure for 45 seconds  . CATARACT EXTRACTION Bilateral   . CHOLECYSTECTOMY N/A  08/16/2017   Procedure: LAPAROSCOPIC CHOLECYSTECTOMY;  Surgeon: Aviva Signs, MD;  Location: [facility];  Service: General;  Laterality: N/A;  . COLONOSCOPY N/A 03/01/2017   Procedure: COLONOSCOPY;  Surgeon: Danie Binder, MD;  Location: [facility];  Service: Endoscopy;  Laterality: N/A;  1:00pm  . ESOPHAGOGASTRODUODENOSCOPY N/A 01/28/2017   Procedure: ESOPHAGOGASTRODUODENOSCOPY (EGD);  Surgeon: Jerene Bears, MD;  Location: [facility] ENDOSCOPY;  Service: Gastroenterology;  Laterality: N/A;  . GIVENS CAPSULE STUDY N/A 03/12/2017   Procedure: GIVENS CAPSULE STUDY;  Surgeon: Danie Binder, MD;  Location: [facility];  Service: Endoscopy;  Laterality: N/A;  7:30am  . TUBAL LIGATION       Family History  Problem Relation Age of Onset  . Heart attack Mother   . Hyperlipidemia Mother   . Hypertension Mother   . Diabetes Mother   . Heart attack Father   . Early death Father 77  . Hyperlipidemia Father   . Hypertension Father   . Heart disease Sister   . Hyperlipidemia Sister   . Hypertension Sister   . Diabetes Brother   . Heart disease Sister   . Hyperlipidemia Sister   . Hypertension Sister   . Heart attack Sister   . Heart disease Sister   . Hyperlipidemia Sister   . Hypertension Sister   . Heart attack Brother   . Early death Brother 39  . Hyperlipidemia Brother   . Hypertension Brother   .  Heart attack Brother   . Early death Brother 7  . Hyperlipidemia Brother   . Hypertension Brother   . Cancer Maternal Aunt   . Colon cancer Neg Hx   . Colon polyps Neg Hx     Social History   Social History Narrative   Retired   Has an [place] in [employer] with husband Marcello Moores for 23 years   Stays busy with home, gardens, likes to can   Social History   Tobacco Use  . Smoking status: Current Every Day Smoker    Packs/day: 1.00    Years: 50.00    Pack years: 50.00    Types: Cigarettes    Start date: 02/09/1967  . Smokeless tobacco: Never Used  Substance Use Topics  . Alcohol  use: No    Current Meds  Medication Sig  . aspirin 81 MG chewable tablet Chew 81 mg by mouth daily.  . cholecalciferol (VITAMIN D) 1000 units tablet Take 2 Units by mouth daily.   . clopidogrel (PLAVIX) 75 MG tablet TAKE ONE (1) TABLET BY MOUTH EVERY DAY  . Cyanocobalamin (VITAMIN B 12 PO) Take 1 tablet by mouth daily.   . ferrous sulfate 325 (65 FE) MG tablet Take 325 mg by mouth daily with breakfast.  . lisinopril (ZESTRIL) 2.5 MG tablet TAKE ONE TABLET ONCE DAILY  . NITROSTAT 0.4 MG SL tablet PLACE 1 TABLET UNDER TONGUE EVERY 5 MINUTES FOR 3 DOSES AS NEEDED.  Marland Kitchen Probiotic Product (PROBIOTIC-10 PO) Take 10 mg by mouth daily.  . rosuvastatin (CRESTOR) 40 MG tablet TAKE ONE TABLET BY MOUTH ONCE DAILY      Depression screen [facility] 2/9 05/11/2019 02/02/2019 02/04/2017 10/25/2016  Decreased Interest 0 0 0 0  Down, Depressed, Hopeless 0 0 0 0  PHQ - 2 Score 0 0 0 0     Objective:   Today's Vitals: BP 120/69 (BP Location: Left Arm, Patient Position: Sitting, Cuff Size: Normal)   Pulse 89   Temp 97.7 F (36.5 C) (Temporal)   Ht 5\' 1"  (1.549 m)   Wt 131 lb 12.8 oz (59.8 kg)   SpO2 94%   BMI 24.90 kg/m  Vitals with BMI 08/13/2019 06/08/2019 05/11/2019  Height 5\' 1"  5\' 1"  5\' 1"   Weight 131 lbs 13 oz 133 lbs 132 lbs  BMI 24.92 78.24 23.53  Systolic 614 96 431  Diastolic 69 44 65  Pulse 89 100 108     Physical Exam  She looks systemically well and does not appear to be dyspneic at rest.  Blood pressure is well controlled.  No other new physical findings.     Assessment   1. COPD (chronic obstructive pulmonary disease) with chronic bronchitis (HCC)   2. Stage 3a chronic kidney disease   3. Essential hypertension   4. Anemia, unspecified type   5. Mixed hyperlipidemia   6. Other general symptoms and signs        Tests ordered Orders Placed This Encounter  Procedures  . COMPLETE METABOLIC PANEL WITH GFR  . CBC  . Lipid panel  . Iron and TIBC  . B12 and Folate Panel  .  Ferritin     Plan: 1. She will continue with lisinopril for hypertension as this is controlling her blood pressure well. 2. She will continue with Crestor for hyperlipidemia and I will check lipid panel today. 3. In view of her anemia, I will check iron studies and B12 and folate levels also. 4. Further recommendations will  depend on blood results and I will have her follow-up with me in about 4 months time.   No orders of the defined types were placed in this encounter.   Doree Albee, MD

## 2019-08-14 LAB — CBC
HCT: 30.7 % — ABNORMAL LOW (ref 35.0–45.0)
Hemoglobin: 10 g/dL — ABNORMAL LOW (ref 11.7–15.5)
MCH: 33.2 pg — ABNORMAL HIGH (ref 27.0–33.0)
MCHC: 32.6 g/dL (ref 32.0–36.0)
MCV: 102 fL — ABNORMAL HIGH (ref 80.0–100.0)
MPV: 10.7 fL (ref 7.5–12.5)
Platelets: 157 10*3/uL (ref 140–400)
RBC: 3.01 10*6/uL — ABNORMAL LOW (ref 3.80–5.10)
RDW: 12.6 % (ref 11.0–15.0)
WBC: 4.2 10*3/uL (ref 3.8–10.8)

## 2019-08-14 LAB — COMPLETE METABOLIC PANEL WITH GFR
AG Ratio: 1.9 (calc) (ref 1.0–2.5)
ALT: 8 U/L (ref 6–29)
AST: 10 U/L (ref 10–35)
Albumin: 4 g/dL (ref 3.6–5.1)
Alkaline phosphatase (APISO): 75 U/L (ref 37–153)
BUN/Creatinine Ratio: 9 (calc) (ref 6–22)
BUN: 16 mg/dL (ref 7–25)
CO2: 30 mmol/L (ref 20–32)
Calcium: 9 mg/dL (ref 8.6–10.4)
Chloride: 106 mmol/L (ref 98–110)
Creat: 1.7 mg/dL — ABNORMAL HIGH (ref 0.50–0.99)
GFR, Est African American: 36 mL/min/{1.73_m2} — ABNORMAL LOW (ref >=60)
GFR, Est Non African American: 31 mL/min/{1.73_m2} — ABNORMAL LOW (ref >=60)
Globulin: 2.1 g/dL (calc) (ref 1.9–3.7)
Glucose, Bld: 120 mg/dL — ABNORMAL HIGH (ref 65–99)
Potassium: 3.6 mmol/L (ref 3.5–5.3)
Sodium: 143 mmol/L (ref 135–146)
Total Bilirubin: 0.3 mg/dL (ref 0.2–1.2)
Total Protein: 6.1 g/dL (ref 6.1–8.1)

## 2019-08-14 LAB — B12 AND FOLATE PANEL
Folate: 15.1 ng/mL
Vitamin B-12: >2000 pg/mL — ABNORMAL HIGH (ref 200–1100)

## 2019-08-14 LAB — LIPID PANEL
Cholesterol: 112 mg/dL (ref <200)
HDL: 40 mg/dL — ABNORMAL LOW (ref >=50)
LDL Cholesterol (Calc): 42 mg/dL (calc)
Non-HDL Cholesterol (Calc): 72 mg/dL (calc) (ref <130)
Total CHOL/HDL Ratio: 2.8 (calc) (ref <5.0)
Triglycerides: 257 mg/dL — ABNORMAL HIGH (ref <150)

## 2019-08-14 LAB — IRON,TIBC AND FERRITIN PANEL
%SAT: 19 % (calc) (ref 16–45)
Ferritin: 26 ng/mL (ref 16–288)
Iron: 65 ug/dL (ref 45–160)
TIBC: 347 mcg/dL (calc) (ref 250–450)

## 2019-11-11 NOTE — Progress Notes (Signed)
Cardiology Office Note    Date:  11/16/2019   ID:  Kaitlin [name], DOB 07/28/1950, MRN [id number]  PCP:  Doree Albee, MD  Cardiologist: Carlyle Dolly, MD EPS: None  Chief Complaint  Patient presents with  . Follow-up    History of Present Illness:  Kaitlin [name] is a 69 y.o. female with history of CAD status post DES to the LAD and BMS to the RCA x2 03/1306 RCA PCI complicated by dissection which was also stented.  Reported normal LV function from notes.  Had significant anemia requiring transfusion extensive GI work-up without clear source of bleeding back on aspirin and Plavix, history of right carotid endarterectomy and right external iliac artery stenosis, AAA 3.5 cm 2020, HLD  Last saw Dr. Harl Bowie 05/2019 and was doing well  Patient had a cramp under her left breast last week that lasted 10 sec while sitting down. 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 Does her own house work. Denies chest tightness, pressure, dyspnea. Smokes 1 ppd.       Past Medical History:  Diagnosis Date  . AAA (abdominal aortic aneurysm) ([place])   . Arthritis   . Asthma   . CAD (coronary artery disease)    stents  . COPD (chronic obstructive pulmonary disease) ([place])   . Emphysema of lung ([place])   . GERD (gastroesophageal reflux disease)   . Hyperlipidemia   . Hypertension   . Iliac artery stenosis, right (HCC)    60%-70%  . Myocardial infarction ([place])   . Normocytic anemia 01/26/2017  . PAD (peripheral artery disease) ([place]) 12/14/2010   in the left vertebral, bilateral carotids    Past Surgical History:  Procedure Laterality Date  . CARDIAC CATHETERIZATION  02/04/2005   A cypher 5.3 x 18 mm at 16 atmosphere of pressure in the mid LAD, this stent covered the proximal part of the LAD and also the mid LAD, this was then post dilated with 3.25 x 15 mm Quantum at 16 atmosphereof pressure for 45 seconds  . CATARACT EXTRACTION Bilateral   . CHOLECYSTECTOMY N/A 08/16/2017    Procedure: LAPAROSCOPIC CHOLECYSTECTOMY;  Surgeon: Aviva Signs, MD;  Location: [facility];  Service: General;  Laterality: N/A;  . COLONOSCOPY N/A 03/01/2017   Procedure: COLONOSCOPY;  Surgeon: Danie Binder, MD;  Location: [facility];  Service: Endoscopy;  Laterality: N/A;  1:00pm  . ESOPHAGOGASTRODUODENOSCOPY N/A 01/28/2017   Procedure: ESOPHAGOGASTRODUODENOSCOPY (EGD);  Surgeon: Jerene Bears, MD;  Location: [facility] ENDOSCOPY;  Service: Gastroenterology;  Laterality: N/A;  . GIVENS CAPSULE STUDY N/A 03/12/2017   Procedure: GIVENS CAPSULE STUDY;  Surgeon: Danie Binder, MD;  Location[facility];  Service: Endoscopy;  Laterality: N/A;  7:30am  . TUBAL LIGATION      Current Medications: Current Meds  Medication Sig  . aspirin 81 MG chewable tablet Chew 81 mg by mouth daily.  . cholecalciferol (VITAMIN D) 1000 units tablet Take 2 Units by mouth daily.   . clopidogrel (PLAVIX) 75 MG tablet TAKE ONE (1) TABLET BY MOUTH EVERY DAY  . Cyanocobalamin (VITAMIN B 12 PO) Take 1 tablet by mouth daily.   . ferrous sulfate 325 (65 FE) MG tablet Take 325 mg by mouth daily with breakfast.  . lisinopril (ZESTRIL) 2.5 MG tablet TAKE ONE TABLET ONCE DAILY  . NITROSTAT 0.4 MG SL tablet PLACE 1 TABLET UNDER TONGUE EVERY 5 MINUTES FOR 3 DOSES AS NEEDED.  Marland Kitchen Probiotic Product (PROBIOTIC-10 PO) Take 10 mg by  mouth daily.  . rosuvastatin (CRESTOR) 40 MG tablet TAKE ONE TABLET BY MOUTH ONCE DAILY     Allergies:   Patient has no known allergies.   Social History   Socioeconomic History  . Marital status: Married    Spouse name: Marcello Moores  . Number of children: 3  . Years of education: 5  . Highest education level: Not on file  Occupational History  . Occupation: RETIRED    Comment: warehouse/textiles  Tobacco Use  . Smoking status: Current Every Day Smoker    Packs/day: 1.00    Years: 50.00    Pack years: 50.00    Types: Cigarettes    Start date: 02/09/1967  . Smokeless tobacco: Never Used  Vaping  Use  . Vaping Use: Never used  Substance and Sexual Activity  . Alcohol use: No  . Drug use: No  . Sexual activity: Yes    Birth control/protection: Post-menopausal  Other Topics Concern  . Not on file  Social History Narrative   Retired   Has an [place] in [employer] with husband Marcello Moores for 23 years   Stays busy with home, gardens, likes to can   Social Determinants of Radio broadcast assistant Strain:   . Difficulty of Paying Living Expenses: Not on file  Food Insecurity:   . Worried About Charity fundraiser in the Last Year: Not on file  . Ran Out of Food in the Last Year: Not on file  Transportation Needs:   . Lack of Transportation (Medical): Not on file  . Lack of Transportation (Non-Medical): Not on file  Physical Activity:   . Days of Exercise per Week: Not on file  . Minutes of Exercise per Session: Not on file  Stress:   . Feeling of Stress : Not on file  Social Connections:   . Frequency of Communication with Friends and Family: Not on file  . Frequency of Social Gatherings with Friends and Family: Not on file  . Attends Religious Services: Not on file  . Active Member of Clubs or Organizations: Not on file  . Attends Archivist Meetings: Not on file  . Marital Status: Not on file     Family History:  The patient's   family history includes Cancer in her maternal aunt; Diabetes in her brother and mother; Early death (age of onset: 21) in her brother; Early death (age of onset: 43) in her father; Early death (age of onset: 15) in her brother; Heart attack in her brother, brother, father, mother, and sister; Heart disease in her sister, sister, and sister; Hyperlipidemia in her brother, brother, father, mother, sister, sister, and sister; Hypertension in her brother, brother, father, mother, sister, sister, and sister.   ROS:   Please see the history of present illness.    ROS All other systems reviewed and are negative.   PHYSICAL EXAM:   VS:  BP (!)  108/58   Pulse 84   Ht 5\' 1"  (1.549 m)   Wt 131 lb 3.2 oz (59.5 kg)   SpO2 97%   BMI 24.79 kg/m   Physical Exam  GEN: Thin, in no acute distress  Neck: Right carotid bruit no JVD,  or masses Cardiac:RRR; no murmurs, rubs, or gallops  Respiratory:  clear to auscultation bilaterally, normal work of breathing GI: soft, nontender, nondistended, + BS Ext: without cyanosis, clubbing, or edema, Good distal pulses bilaterally Neuro:  Alert and Oriented x 3 Psych: euthymic mood, full affect  Wt Readings from Last 3 Encounters:  11/16/19 131 lb 3.2 oz (59.5 kg)  08/13/19 131 lb 12.8 oz (59.8 kg)  06/08/19 133 lb (60.3 kg)      Studies/Labs Reviewed:   EKG:  EKG is  ordered today.  The ekg ordered today demonstrates NSR nonspecific ST changes  Recent Labs: 08/13/2019: ALT 8; BUN 16; Creat 1.70; Hemoglobin 10.0; Platelets 157; Potassium 3.6; Sodium 143   Lipid Panel    Component Value Date/Time   CHOL 112 08/13/2019 1307   TRIG 257 (H) 08/13/2019 1307   HDL 40 (L) 08/13/2019 1307   CHOLHDL 2.8 08/13/2019 1307   VLDL 37 04/09/2014 1027   LDLCALC 42 08/13/2019 1307    Additional studies/ records that were reviewed today include:   Carotid Dopplers 06/16/2019 IMPRESSION: Left:   Color duplex indicates moderate heterogeneous and calcified plaque, with no hemodynamically significant stenosis by duplex criteria in the extracranial cerebrovascular circulation.   Right:   Note that established duplex criteria have not been validated in the setting of prior endarterectomy, however, there is no evidence of recurrent high-grade stenosis based on the duplex.   Signed,   Dulcy Fanny. Dellia Nims, RPVI   [facility]   [facility]     Electronically Signed   By: Corrie Mckusick D.O.   On: 06/16/2019 16:20   Abdominal ultrasound 09/11/2018 IMPRESSION: Fusiform distal abdominal aortic aneurysm with maximum diameter of 3.5 cm. This  represents an increase from the prior measurement of 3.1 cm in June 2017. Recommend followup by ultrasound in 2 years. This recommendation follows ACR consensus guidelines: White Paper of the ACR Incidental Findings Committee II on Vascular Findings. J Am Coll Radiol 2013; 10:789-794. Aortic aneurysm NOS (ICD10-I71.9)     Electronically Signed   By: Fidela Salisbury M.D.   On: 09/11/2018 17:32      ASSESSMENT:    1. Coronary artery disease involving native coronary artery of native heart without angina pectoris   2. PAD (peripheral artery disease) ([place])   3. Bilateral carotid artery stenosis   4. Hyperlipidemia, unspecified hyperlipidemia type   5. Abdominal aortic aneurysm (AAA) without rupture ([place])   6. Normocytic anemia      PLAN:  In order of problems listed above:   CAD Status post DES to LAD and BMSx2 to RCA Jan 2007, reported normal LV function from notes.  - the RCA PCI was complicated by dissection which was also stented .  - was temporaily off ASA and plavix due to severe anemia requiring transfusion. Extensive GI workup without clear source of bleeding - back on ASA and plavix now. No further bleeding problems and Hgb 10 07/2019   2. PAD   - 60-70% right external iliac artery stenosis by previous cath   - denies any claudication.      3. Carotid stenosis   - prior right carotid endarterectomy      - Carotid Dopplers 06/16/2019 with calcified plaque no stenosis     4. Hyperlipidemia  - she is compliant with statin  - 08/13/19 LDL 42     5. AAA 08/2018 AAA 3.5 cm should have repeat in 2022   6. Anemia- previously severe as low as 6.7, required transfusion- followed by hematology. From notes thought to be related to CKD- GI workup with EGD showed chronic gastritis, colonscopy no clear bleeding source.    7. Tobacco abuse-continues to smoke 1 ppd. Long discussion on the importance of smoking cessation. She's  not willing to quit at this time.            Medication Adjustments/Labs and Tests Ordered: Current medicines are reviewed at length with the patient today.  Concerns regarding medicines are outlined above.  Medication changes, Labs and Tests ordered today are listed in the Patient Instructions below. There are no Patient Instructions on file for this visit.   Signed, Ermalinda Barrios, PA-C  11/16/2019 2:35 PM    [facility] [address], [place], [place]  [address] Phone: [phone]; Fax: [phone]

## 2019-11-16 ENCOUNTER — Encounter: Payer: Self-pay | Admitting: Physician Assistant

## 2019-11-16 ENCOUNTER — Ambulatory Visit (INDEPENDENT_AMBULATORY_CARE_PROVIDER_SITE_OTHER): Payer: Medicare Other | Admitting: Physician Assistant

## 2019-11-16 ENCOUNTER — Other Ambulatory Visit: Payer: Self-pay

## 2019-11-16 VITALS — BP 108/58 | HR 84 | Ht 61.0 in | Wt 131.2 lb

## 2019-11-16 DIAGNOSIS — D649 Anemia, unspecified: Secondary | ICD-10-CM | POA: Diagnosis not present

## 2019-11-16 DIAGNOSIS — I739 Peripheral vascular disease, unspecified: Secondary | ICD-10-CM | POA: Diagnosis not present

## 2019-11-16 DIAGNOSIS — I2510 Atherosclerotic heart disease of native coronary artery without angina pectoris: Secondary | ICD-10-CM | POA: Diagnosis not present

## 2019-11-16 DIAGNOSIS — I714 Abdominal aortic aneurysm, without rupture, unspecified: Secondary | ICD-10-CM

## 2019-11-16 DIAGNOSIS — E785 Hyperlipidemia, unspecified: Secondary | ICD-10-CM

## 2019-11-16 DIAGNOSIS — I6523 Occlusion and stenosis of bilateral carotid arteries: Secondary | ICD-10-CM | POA: Diagnosis not present

## 2019-11-16 NOTE — Patient Instructions (Signed)
Medication Instructions:  Your physician recommends that you continue on your current medications as directed. Please refer to the Current Medication list given to you today.  *If you need a refill on your cardiac medications before your next appointment, please call your pharmacy*   Lab Work: None If you have labs (blood work) drawn today and your tests are completely normal, you will receive your results only by: Marland Kitchen MyChart Message (if you have MyChart) OR . A paper copy in the mail If you have any lab test that is abnormal or we need to change your treatment, we will call you to review the results.   Testing/Procedures: None   Follow-Up: At [facility], you and your health needs are our priority.  As part of our continuing mission to provide you with exceptional heart care, we have created designated Provider Care Teams.  These Care Teams include your primary Cardiologist (physician) and Advanced Practice Providers (APPs -  Physician Assistants and Nurse Practitioners) who all work together to provide you with the care you need, when you need it.  We recommend signing up for the patient portal called "MyChart".  Sign up information is provided on this After Visit Summary.  MyChart is used to connect with patients for Virtual Visits (Telemedicine).  Patients are able to view lab/test results, encounter notes, upcoming appointments, etc.  Non-urgent messages can be sent to your provider as well.   To learn more about what you can do with MyChart, go to [email or web address].    Your next appointment:   6 month(s)  The format for your next appointment:   In Person  Provider:   You may see Carlyle Dolly, MD or one of the following Advanced Practice Providers on your designated Care Team:    Bernerd Pho, PA-C   Ermalinda Barrios, [place]   Other: Your provider recommends that you maintain 150 minutes per week of moderate aerobic activity.   Coping with Quitting  Smoking  Quitting smoking is a physical and mental challenge. You will face cravings, withdrawal symptoms, and temptation. Before quitting, work with your health care provider to make a plan that can help you cope. Preparation can help you quit and keep you from giving in. How can I cope with cravings? Cravings usually last for 5-10 minutes. If you get through it, the craving will pass. Consider taking the following actions to help you cope with cravings:  Keep your mouth busy: ? Chew sugar-free gum. ? Suck on hard candies or a straw. ? Brush your teeth.  Keep your hands and body busy: ? Immediately change to a different activity when you feel a craving. ? Squeeze or play with a ball. ? Do an activity or a hobby, like making bead jewelry, practicing needlepoint, or working with wood. ? Mix up your normal routine. ? Take a short exercise break. Go for a quick walk or run up and down stairs. ? Spend time in public places where smoking is not allowed.  Focus on doing something kind or helpful for someone else.  Call a friend or family member to talk during a craving.  Join a support group.  Call a quit line, such as 1-800-QUIT-NOW.  Talk with your health care provider about medicines that might help you cope with cravings and make quitting easier for you. How can I deal with withdrawal symptoms? Your body may experience negative effects as it tries to get used to not having nicotine in the system. These effects are called  withdrawal symptoms. They may include:  Feeling hungrier than normal.  Trouble concentrating.  Irritability.  Trouble sleeping.  Feeling depressed.  Restlessness and agitation.  Craving a cigarette. To manage withdrawal symptoms:  Avoid places, people, and activities that trigger your cravings.  Remember why you want to quit.  Get plenty of sleep.  Avoid coffee and other caffeinated drinks. These may worsen some of your symptoms. How can I handle  social situations? Social situations can be difficult when you are quitting smoking, especially in the first few weeks. To manage this, you can:  Avoid parties, bars, and other social situations where people might be smoking.  Avoid alcohol.  Leave right away if you have the urge to smoke.  Explain to your family and friends that you are quitting smoking. Ask for understanding and support.  Plan activities with friends or family where smoking is not an option. What are some ways I can cope with stress? Wanting to smoke may cause stress, and stress can make you want to smoke. Find ways to manage your stress. Relaxation techniques can help. For example:  Breathe slowly and deeply, in through your nose and out through your mouth.  Listen to soothing, relaxing music.  Talk with a family member or friend about your stress.  Light a candle.  Soak in a bath or take a shower.  Think about a peaceful place. What are some ways I can prevent weight gain? Be aware that many people gain weight after they quit smoking. However, not everyone does. To keep from gaining weight, have a plan in place before you quit and stick to the plan after you quit. Your plan should include:  Having healthy snacks. When you have a craving, it may help to: ? Eat plain popcorn, crunchy carrots, celery, or other cut vegetables. ? Chew sugar-free gum.  Changing how you eat: ? Eat small portion sizes at meals. ? Eat 4-6 small meals throughout the day instead of 1-2 large meals a day. ? Be mindful when you eat. Do not watch television or do other things that might distract you as you eat.  Exercising regularly: ? Make time to exercise each day. If you do not have time for a long workout, do short bouts of exercise for 5-10 minutes several times a day. ? Do some form of strengthening exercise, like weight lifting, and some form of aerobic exercise, like running or swimming.  Drinking plenty of water or other  low-calorie or no-calorie drinks. Drink 6-8 glasses of water daily, or as much as instructed by your health care provider. Summary  Quitting smoking is a physical and mental challenge. You will face cravings, withdrawal symptoms, and temptation to smoke again. Preparation can help you as you go through these challenges.  You can cope with cravings by keeping your mouth busy (such as by chewing gum), keeping your body and hands busy, and making calls to family, friends, or a helpline for people who want to quit smoking.  You can cope with withdrawal symptoms by avoiding places where people smoke, avoiding drinks with caffeine, and getting plenty of rest.  Ask your health care provider about the different ways to prevent weight gain, avoid stress, and handle social situations. This information is not intended to replace advice given to you by your health care provider. Make sure you discuss any questions you have with your health care provider. Document Revised: 12/28/2016 Document Reviewed: 01/13/2016 Elsevier Patient Education  2020 [employer].

## 2019-11-30 ENCOUNTER — Other Ambulatory Visit (INDEPENDENT_AMBULATORY_CARE_PROVIDER_SITE_OTHER): Payer: Self-pay | Admitting: Nurse Practitioner

## 2019-11-30 DIAGNOSIS — R928 Other abnormal and inconclusive findings on diagnostic imaging of breast: Secondary | ICD-10-CM

## 2019-11-30 NOTE — Progress Notes (Signed)
25-month diagnostic mammogram to further evaluate asymmetry on previous imaging study.  Nellie, please call this patient to schedule the imaging test one it is improved through insurance. Thank you.

## 2019-11-30 NOTE — Progress Notes (Signed)
Do I need to add this to the order, or will they?

## 2019-11-30 NOTE — Progress Notes (Signed)
Yes, you will need to add so I can schedule. Per Rad Dept tech to set appt.

## 2019-11-30 NOTE — Progress Notes (Signed)
Rad to add this  To order need to add :  Tomo order added,  US breast left limited.

## 2019-12-01 NOTE — Addendum Note (Signed)
Addended by: Ailene Ards on: 12/01/2019 07:57 AM   Modules accepted: Orders

## 2019-12-01 NOTE — Progress Notes (Signed)
I was unable to find the exact order for the limited u/s, but I did order a left breast ultrasound in addition to the mammogram. Thank you.

## 2019-12-17 ENCOUNTER — Encounter (INDEPENDENT_AMBULATORY_CARE_PROVIDER_SITE_OTHER): Payer: Self-pay | Admitting: Internal Medicine

## 2019-12-17 ENCOUNTER — Other Ambulatory Visit: Payer: Self-pay

## 2019-12-17 ENCOUNTER — Ambulatory Visit (HOSPITAL_COMMUNITY)
Admission: RE | Admit: 2019-12-17 | Discharge: 2019-12-17 | Disposition: A | Discharge status: Home / Self Care | Payer: Medicare Other | Source: Ambulatory Visit | Attending: Nurse Practitioner | Admitting: Nurse Practitioner

## 2019-12-17 ENCOUNTER — Ambulatory Visit (INDEPENDENT_AMBULATORY_CARE_PROVIDER_SITE_OTHER): Payer: Medicare Other | Admitting: Internal Medicine

## 2019-12-17 VITALS — BP 108/60 | HR 81 | Temp 97.7°F | Ht 61.0 in | Wt 131.0 lb

## 2019-12-17 DIAGNOSIS — E782 Mixed hyperlipidemia: Secondary | ICD-10-CM | POA: Diagnosis not present

## 2019-12-17 DIAGNOSIS — R928 Other abnormal and inconclusive findings on diagnostic imaging of breast: Secondary | ICD-10-CM | POA: Insufficient documentation

## 2019-12-17 DIAGNOSIS — N1831 Chronic kidney disease, stage 3a: Secondary | ICD-10-CM

## 2019-12-17 DIAGNOSIS — I10 Essential (primary) hypertension: Secondary | ICD-10-CM

## 2019-12-17 DIAGNOSIS — N6489 Other specified disorders of breast: Secondary | ICD-10-CM | POA: Diagnosis not present

## 2019-12-17 DIAGNOSIS — D649 Anemia, unspecified: Secondary | ICD-10-CM | POA: Diagnosis not present

## 2019-12-17 DIAGNOSIS — I6523 Occlusion and stenosis of bilateral carotid arteries: Secondary | ICD-10-CM

## 2019-12-17 NOTE — Progress Notes (Signed)
Metrics: Intervention Frequency ACO  Documented Smoking Status Yearly  Screened one or more times in 24 months  Cessation Counseling or  Active cessation medication Past 24 months  Past 24 months   Guideline developer: UpToDate (See UpToDate for funding source) Date Released: 2014       Wellness Office Visit  Subjective:  Patient ID: Kaitlin [name], female    DOB: Nov 12, 1950  Age: 69 y.o. MRN: [id number]  CC: This lady comes in for follow-up of hypertension, hyperlipidemia, chronic anemia due to chronic kidney disease. HPI She is doing well.  She has no complaints.  She continues on antihypertensive therapy as before and statin therapy as before.  She denies any chest pain, dyspnea, palpitations or limb weakness.  Past Medical History:  Diagnosis Date  . AAA (abdominal aortic aneurysm) ([place])   . Arthritis   . Asthma   . CAD (coronary artery disease)    stents  . COPD (chronic obstructive pulmonary disease) ([place])   . Emphysema of lung ([place])   . GERD (gastroesophageal reflux disease)   . Hyperlipidemia   . Hypertension   . Iliac artery stenosis, right (HCC)    60%-70%  . Myocardial infarction ([place])   . Normocytic anemia 01/26/2017  . PAD (peripheral artery disease) ([place]) 12/14/2010   in the left vertebral, bilateral carotids   Past Surgical History:  Procedure Laterality Date  . CARDIAC CATHETERIZATION  02/04/2005   A cypher 5.3 x 18 mm at 16 atmosphere of pressure in the mid LAD, this stent covered the proximal part of the LAD and also the mid LAD, this was then post dilated with 3.25 x 15 mm Quantum at 16 atmosphereof pressure for 45 seconds  . CATARACT EXTRACTION Bilateral   . CHOLECYSTECTOMY N/A 08/16/2017   Procedure: LAPAROSCOPIC CHOLECYSTECTOMY;  Surgeon: Aviva Signs, MD;  Location: [facility];  Service: General;  Laterality: N/A;  . COLONOSCOPY N/A 03/01/2017   Procedure: COLONOSCOPY;  Surgeon: Danie Binder, MD;  [facility];  Service: Endoscopy;   Laterality: N/A;  1:00pm  . ESOPHAGOGASTRODUODENOSCOPY N/A 01/28/2017   Procedure: ESOPHAGOGASTRODUODENOSCOPY (EGD);  Surgeon: Jerene Bears, MD;  Location: [facility]COPY;  Service: Gastroenterology;  Laterality: N/A;  . GIVENS CAPSULE STUDY N/A 03/12/2017   Procedure: GIVENS CAPSULE STUDY;  Surgeon: Danie Binder, MD;  [facility];  Service: Endoscopy;  Laterality: N/A;  7:30am  . TUBAL LIGATION       Family History  Problem Relation Age of Onset  . Heart attack Mother   . Hyperlipidemia Mother   . Hypertension Mother   . Diabetes Mother   . Heart attack Father   . Early death Father 61  . Hyperlipidemia Father   . Hypertension Father   . Heart disease Sister   . Hyperlipidemia Sister   . Hypertension Sister   . Diabetes Brother   . Heart disease Sister   . Hyperlipidemia Sister   . Hypertension Sister   . Heart attack Sister   . Heart disease Sister   . Hyperlipidemia Sister   . Hypertension Sister   . Heart attack Brother   . Early death Brother 88  . Hyperlipidemia Brother   . Hypertension Brother   . Heart attack Brother   . Early death Brother 48  . Hyperlipidemia Brother   . Hypertension Brother   . Cancer Maternal Aunt   . Colon cancer Neg Hx   . Colon polyps Neg Hx     Social History  Social History Narrative   Retired   Has an AA in [employer] with husband Marcello Moores for 23 years   Stays busy with home, gardens, likes to can   Social History   Tobacco Use  . Smoking status: Current Every Day Smoker    Packs/day: 1.00    Years: 50.00    Pack years: 50.00    Types: Cigarettes    Start date: 02/09/1967  . Smokeless tobacco: Never Used  Substance Use Topics  . Alcohol use: No    Current Meds  Medication Sig  . aspirin 81 MG chewable tablet Chew 81 mg by mouth daily.  . cholecalciferol (VITAMIN D) 1000 units tablet Take 2 Units by mouth daily.   . clopidogrel (PLAVIX) 75 MG tablet TAKE ONE (1) TABLET BY MOUTH EVERY DAY  .  Cyanocobalamin (VITAMIN B 12 PO) Take 1 tablet by mouth daily.   . ferrous sulfate 325 (65 FE) MG tablet Take 325 mg by mouth daily with breakfast.  . lisinopril (ZESTRIL) 2.5 MG tablet TAKE ONE TABLET ONCE DAILY  . NITROSTAT 0.4 MG SL tablet PLACE 1 TABLET UNDER TONGUE EVERY 5 MINUTES FOR 3 DOSES AS NEEDED.  Marland Kitchen Probiotic Product (PROBIOTIC-10 PO) Take 10 mg by mouth daily.  . rosuvastatin (CRESTOR) 40 MG tablet TAKE ONE TABLET BY MOUTH ONCE DAILY      Depression screen [facility] 2/9 05/11/2019 02/02/2019 02/04/2017 10/25/2016  Decreased Interest 0 0 0 0  Down, Depressed, Hopeless 0 0 0 0  PHQ - 2 Score 0 0 0 0     Objective:   Today's Vitals: BP 108/60   Pulse 81   Temp 97.7 F (36.5 C) (Temporal)   Ht 5\' 1"  (1.549 m)   Wt 131 lb (59.4 kg)   SpO2 97%   BMI 24.75 kg/m  Vitals with BMI 12/17/2019 11/16/2019 08/13/2019  Height 5\' 1"  5\' 1"  5\' 1"   Weight 131 lbs 131 lbs 3 oz 131 lbs 13 oz  BMI 24.76 86.5 78.46  Systolic 962 952 841  Diastolic 60 58 69  Pulse 81 84 89     Physical Exam   He looks systemically well.  Blood pressure is excellent.  Alert and orientated without any focal neurological signs.    Assessment   1. Stage 3a chronic kidney disease ([place])   2. Essential hypertension   3. Mixed hyperlipidemia   4. Anemia, unspecified type       Tests ordered No orders of the defined types were placed in this encounter.    Plan: 1. Chronic kidney disease is stable based on blood work the last visit. 2. Hypertension is very well controlled, consider reducing the dose of lisinopril on the next visit. 3. Continue with statin therapy for hyperlipidemia. 4. Her anemia is stable and she is not iron B12 or folate deficient. 5. Follow-up with Judson Roch as scheduled in April for an annual Medicare wellness visit.   No orders of the defined types were placed in this encounter.   Doree Albee, MD

## 2020-02-04 DIAGNOSIS — Z23 Encounter for immunization: Secondary | ICD-10-CM | POA: Diagnosis not present

## 2020-02-23 ENCOUNTER — Encounter: Payer: Self-pay | Admitting: Internal Medicine

## 2020-03-22 ENCOUNTER — Other Ambulatory Visit: Payer: Self-pay | Admitting: Cardiology

## 2020-03-24 ENCOUNTER — Other Ambulatory Visit: Payer: Self-pay

## 2020-03-24 ENCOUNTER — Telehealth: Payer: Self-pay | Admitting: Cardiology

## 2020-03-24 MED ORDER — ROSUVASTATIN CALCIUM 40 MG PO TABS
40.0000 mg | ORAL_TABLET | Freq: Every day | ORAL | 3 refills | Status: DC
Start: 2020-03-24 — End: 2021-04-21

## 2020-03-24 NOTE — Telephone Encounter (Signed)
Medication refill request approved.

## 2020-03-24 NOTE — Telephone Encounter (Signed)
*  STAT* If patient is at the pharmacy, call can be transferred to refill team.   1. Which medications need to be refilled? (please list name of each medication and dose if known) rosuvastatin (CRESTOR) 40 MG tablet  2. Which pharmacy/location (including street and city if local pharmacy) is medication to be sent to? New Pharmacy-[facility] [place]  3. Do they need a 30 day or 90 day supply? 90 only has 1 day remaining

## 2020-05-16 ENCOUNTER — Ambulatory Visit: Payer: Medicare Other | Admitting: Cardiology

## 2020-05-16 NOTE — Progress Notes (Deleted)
Clinical Summary Kaitlin [name] is a 70 y.o.female seen today for follow up of the following medical problems.   1. CAD  - w/ DES to LAD and BMSx2 to RCA Jan 2007, reported normal LV function from notes.  - the RCA PCI was complicated by dissection which was also stented   - was temporaily off ASA and plavix due to severe anemia requiring transfusion. Extensive GI workup without clear source of bleeding - back on ASA and plavix now.   - no recent chest pain. No SOB/DOE  - compliant with meds. No recent bleeding on DAPT.   2. PAD  - 60-70% right external iliac artery stenosis by previous cath  - denies any claudication.    3. Carotid stenosis  - prior right carotid endarterectomy   -stable follow up US 09/2017. - no neuro symptoms     4. Hyperlipidemia  -she is compliant with statin  - Jan 2021 TC 136 HDL 44 TG 361 LDL 54  5. AAA 07/2014 small AAA 3 x 3.2 cm. - 12/2016 3.2 cm and stable.  08/2018 AAA 3.5 cm  6. Anemia - previously severe as low as 6.7, required transfusion - followed by hematology. From notes thought to be related to CKD - GI workup with EGD showed chronic gastritis, colonscopy no clear bleeding source.      SH: just retired from [employer]. Spends most of time with great grandchildren (9 months, 2.5 years). Another is due in August.   Past Medical History:  Diagnosis Date  . AAA (abdominal aortic aneurysm) ([place])   . Arthritis   . Asthma   . CAD (coronary artery disease)    stents  . COPD (chronic obstructive pulmonary disease) ([place])   . Emphysema of lung ([place])   . GERD (gastroesophageal reflux disease)   . Hyperlipidemia   . Hypertension   . Iliac artery stenosis, right (HCC)    60%-70%  . Myocardial infarction ([place])   . Normocytic anemia 01/26/2017  . PAD (peripheral artery disease) ([place]) 12/14/2010   in the left vertebral, bilateral carotids     No Known Allergies   Current  Outpatient Medications  Medication Sig Dispense Refill  . aspirin 81 MG chewable tablet Chew 81 mg by mouth daily.    . cholecalciferol (VITAMIN D) 1000 units tablet Take 2 Units by mouth daily.     . clopidogrel (PLAVIX) 75 MG tablet TAKE ONE (1) TABLET BY MOUTH EVERY DAY 90 tablet 3  . Cyanocobalamin (VITAMIN B 12 PO) Take 1 tablet by mouth daily.     . ferrous sulfate 325 (65 FE) MG tablet Take 325 mg by mouth daily with breakfast.    . lisinopril (ZESTRIL) 2.5 MG tablet TAKE ONE TABLET ONCE DAILY 90 tablet 3  . NITROSTAT 0.4 MG SL tablet PLACE 1 TABLET UNDER TONGUE EVERY 5 MINUTES FOR 3 DOSES AS NEEDED. 25 tablet 3  . Probiotic Product (PROBIOTIC-10 PO) Take 10 mg by mouth daily.    . rosuvastatin (CRESTOR) 40 MG tablet Take 1 tablet (40 mg total) by mouth daily. 90 tablet 3   No current facility-administered medications for this visit.     Past Surgical History:  Procedure Laterality Date  . CARDIAC CATHETERIZATION  02/04/2005   A cypher 5.3 x 18 mm at 16 atmosphere of pressure in the mid LAD, this stent covered the proximal part of the LAD and also the mid LAD, this was then post dilated with 3.25 x 15 mm  Quantum at 16 atmosphereof pressure for 45 seconds  . CATARACT EXTRACTION Bilateral   . CHOLECYSTECTOMY N/A 08/16/2017   Procedure: LAPAROSCOPIC CHOLECYSTECTOMY;  Surgeon: Aviva Signs, MD;  Location: [facility];  Service: General;  Laterality: N/A;  . COLONOSCOPY N/A 03/01/2017   Procedure: COLONOSCOPY;  Surgeon: Danie Binder, MD;  [facility];  Service: Endoscopy;  Laterality: N/A;  1:00pm  . ESOPHAGOGASTRODUODENOSCOPY N/A 01/28/2017   Procedure: ESOPHAGOGASTRODUODENOSCOPY (EGD);  Surgeon: Jerene Bears, MD;  Location: [facility] ENDOSCOPY;  Service: Gastroenterology;  Laterality: N/A;  . GIVENS CAPSULE STUDY N/A 03/12/2017   Procedure: GIVENS CAPSULE STUDY;  Surgeon: Danie Binder, MD;  [facility];  Service: Endoscopy;  Laterality: N/A;  7:30am  . TUBAL LIGATION        No Known Allergies    Family History  Problem Relation Age of Onset  . Heart attack Mother   . Hyperlipidemia Mother   . Hypertension Mother   . Diabetes Mother   . Heart attack Father   . Early death Father 36  . Hyperlipidemia Father   . Hypertension Father   . Heart disease Sister   . Hyperlipidemia Sister   . Hypertension Sister   . Diabetes Brother   . Heart disease Sister   . Hyperlipidemia Sister   . Hypertension Sister   . Heart attack Sister   . Heart disease Sister   . Hyperlipidemia Sister   . Hypertension Sister   . Heart attack Brother   . Early death Brother 101  . Hyperlipidemia Brother   . Hypertension Brother   . Heart attack Brother   . Early death Brother 56  . Hyperlipidemia Brother   . Hypertension Brother   . Cancer Maternal Aunt   . Colon cancer Neg Hx   . Colon polyps Neg Hx      Social History Kaitlin [name] reports that she has been smoking cigarettes. She started smoking about 53 years ago. She has a 50.00 pack-year smoking history. She has never used smokeless tobacco. Kaitlin [name] reports no history of alcohol use.   Review of Systems CONSTITUTIONAL: No weight loss, fever, chills, weakness or fatigue.  HEENT: Eyes: No visual loss, blurred vision, double vision or yellow sclerae.No hearing loss, sneezing, congestion, runny nose or sore throat.  SKIN: No rash or itching.  CARDIOVASCULAR:  RESPIRATORY: No shortness of breath, cough or sputum.  GASTROINTESTINAL: No anorexia, nausea, vomiting or diarrhea. No abdominal pain or blood.  GENITOURINARY: No burning on urination, no polyuria NEUROLOGICAL: No headache, dizziness, syncope, paralysis, ataxia, numbness or tingling in the extremities. No change in bowel or bladder control.  MUSCULOSKELETAL: No muscle, back pain, joint pain or stiffness.  LYMPHATICS: No enlarged nodes. No history of splenectomy.  PSYCHIATRIC: No history of depression or anxiety.  ENDOCRINOLOGIC: No reports of  sweating, cold or heat intolerance. No polyuria or polydipsia.  Marland Kitchen   Physical Examination There were no vitals filed for this visit. There were no vitals filed for this visit.  Gen: resting comfortably, no acute distress HEENT: no scleral icterus, pupils equal round and reactive, no palptable cervical adenopathy,  CV Resp: Clear to auscultation bilaterally GI: abdomen is soft, non-tender, non-distended, normal bowel sounds, no hepatosplenomegaly MSK: extremities are warm, no edema.  Skin: warm, no rash Neuro:  no focal deficits Psych: appropriate affect   Diagnostic Studies     Assessment and Plan  1. CAD -Given her history of complex RCA PCI as well as carotid and  PAD would favor continuing DAPT -no symptoms, continue current meds  2. HTN -manual recheck bp 110/60 at goal, cotinue current meds  3. Hyperlipidemia - continue statin, LDL at goal, Discussed dietary modifications to improve TGs  4. Carotid stenosis -recheck carotid US  5. AAA - mild by [place] last year, continue to montor      Arnoldo Lenis, M.D., F.A.C.C.

## 2020-05-19 ENCOUNTER — Encounter (INDEPENDENT_AMBULATORY_CARE_PROVIDER_SITE_OTHER): Payer: Self-pay | Admitting: Nurse Practitioner

## 2020-05-19 ENCOUNTER — Ambulatory Visit (INDEPENDENT_AMBULATORY_CARE_PROVIDER_SITE_OTHER): Payer: Medicare Other | Admitting: Nurse Practitioner

## 2020-05-20 ENCOUNTER — Other Ambulatory Visit: Payer: Self-pay | Admitting: Cardiology

## 2020-06-21 ENCOUNTER — Other Ambulatory Visit: Payer: Self-pay | Admitting: Cardiology

## 2020-06-30 ENCOUNTER — Encounter (INDEPENDENT_AMBULATORY_CARE_PROVIDER_SITE_OTHER): Payer: Self-pay

## 2020-07-15 ENCOUNTER — Encounter: Payer: Self-pay | Admitting: Cardiology

## 2020-07-15 ENCOUNTER — Ambulatory Visit (INDEPENDENT_AMBULATORY_CARE_PROVIDER_SITE_OTHER): Payer: Medicare Other | Admitting: Cardiology

## 2020-07-15 ENCOUNTER — Other Ambulatory Visit: Payer: Self-pay

## 2020-07-15 VITALS — BP 96/50 | HR 88 | Ht 61.0 in | Wt 126.0 lb

## 2020-07-15 DIAGNOSIS — E782 Mixed hyperlipidemia: Secondary | ICD-10-CM | POA: Diagnosis not present

## 2020-07-15 DIAGNOSIS — I714 Abdominal aortic aneurysm, without rupture, unspecified: Secondary | ICD-10-CM

## 2020-07-15 DIAGNOSIS — I6523 Occlusion and stenosis of bilateral carotid arteries: Secondary | ICD-10-CM

## 2020-07-15 DIAGNOSIS — I2510 Atherosclerotic heart disease of native coronary artery without angina pectoris: Secondary | ICD-10-CM | POA: Diagnosis not present

## 2020-07-15 NOTE — Addendum Note (Signed)
Addended by: Barbarann Ehlers A on: 07/15/2020 03:00 PM   Modules accepted: Orders

## 2020-07-15 NOTE — Progress Notes (Signed)
Clinical Summary Kaitlin [name] is a 70 y.o.female  seen today for follow up of the following medical problems.   1. CAD   - w/ DES to LAD and BMSx2 to RCA Jan 2007, reported normal LV function from notes.   - the RCA PCI was complicated by dissection which was also stented     - was temporaily off ASA and plavix due to severe anemia requiring transfusion. Extensive GI workup without clear source of bleeding - back on ASA and plavix now.       -no recent chest pain. No SOB or DOE - compliatnt with meds. Infrequent lightheadedness   2. PAD   - 60-70% right external iliac artery stenosis by previous cath   - no recent claudication.        3. Carotid stenosis   - prior right carotid endarterectomy      - stable follow up US 09/2017. - no neuro symptoms   05/2019 carotid US no significant disease - no recent symptoms       4. Hyperlipidemia     - Jan 2021 TC 136 HDL 44 TG 361 LDL 54 - 07/2019 TC 112 HDL 40 TG 257 LDL 42 - upcoming labs with pcp, she remainso n statin     5. AAA 07/2014 small AAA 3 x 3.2 cm.  - 12/2016 3.2 cm and stable.   08/2018 AAA 3.5 cm   6. Anemia - previously severe as low as 6.7, required transfusion - followed by hematology. From notes thought to be related to CKD - GI workup with EGD showed chronic gastritis, colonscopy no clear bleeding source.            SH: just retired from [employer] job. Spends most of time with great grandchildren (4 total). Another is due in August.  Works part time make jam.  Past Medical History:  Diagnosis Date   AAA (abdominal aortic aneurysm) ([place])    Arthritis    Asthma    CAD (coronary artery disease)    stents   COPD (chronic obstructive pulmonary disease) (HCC)    Emphysema of lung (HCC)    GERD (gastroesophageal reflux disease)    Hyperlipidemia    Hypertension    Iliac artery stenosis, right (HCC)    60%-70%   Myocardial infarction ([place])    Normocytic anemia 01/26/2017   PAD (peripheral  artery disease) ([place]) 12/14/2010   in the left vertebral, bilateral carotids     No Known Allergies   Current Outpatient Medications  Medication Sig Dispense Refill   aspirin 81 MG chewable tablet Chew 81 mg by mouth daily.     cholecalciferol (VITAMIN D) 1000 units tablet Take 2 Units by mouth daily.      clopidogrel (PLAVIX) 75 MG tablet TAKE ONE (1) TABLET BY MOUTH EVERY DAY 90 tablet 1   Cyanocobalamin (VITAMIN B 12 PO) Take 1 tablet by mouth daily.      ferrous sulfate 325 (65 FE) MG tablet Take 325 mg by mouth daily with breakfast.     lisinopril (ZESTRIL) 2.5 MG tablet TAKE ONE TABLET ONCE DAILY 30 tablet 0   NITROSTAT 0.4 MG SL tablet PLACE 1 TABLET UNDER TONGUE EVERY 5 MINUTES FOR 3 DOSES AS NEEDED. 25 tablet 3   Probiotic Product (PROBIOTIC-10 PO) Take 10 mg by mouth daily.     rosuvastatin (CRESTOR) 40 MG tablet Take 1 tablet (40 mg total) by mouth daily. 90 tablet 3   No current  facility-administered medications for this visit.     Past Surgical History:  Procedure Laterality Date   CARDIAC CATHETERIZATION  02/04/2005   A cypher 5.3 x 18 mm at 16 atmosphere of pressure in the mid LAD, this stent covered the proximal part of the LAD and also the mid LAD, this was then post dilated with 3.25 x 15 mm Quantum at 16 atmosphereof pressure for 45 seconds   CATARACT EXTRACTION Bilateral    CHOLECYSTECTOMY N/A 08/16/2017   Procedure: LAPAROSCOPIC CHOLECYSTECTOMY;  Surgeon: Aviva Signs, MD;  Location: [facility];  Service: General;  Laterality: N/A;   COLONOSCOPY N/A 03/01/2017   Procedure: COLONOSCOPY;  Surgeon: Danie Binder, MD;  [facility];  Service: Endoscopy;  Laterality: N/A;  1:00pm   ESOPHAGOGASTRODUODENOSCOPY N/A 01/28/2017   Procedure: ESOPHAGOGASTRODUODENOSCOPY (EGD);  Surgeon: Jerene Bears, MD;  Location: [facility] ENDOSCOPY;  Service: Gastroenterology;  Laterality: N/A;   GIVENS CAPSULE STUDY N/A 03/12/2017   Procedure: GIVENS CAPSULE STUDY;  Surgeon: Danie Binder, MD;  [facility];  Service: Endoscopy;  Laterality: N/A;  7:30am   TUBAL LIGATION       No Known Allergies    Family History  Problem Relation Age of Onset   Heart attack Mother    Hyperlipidemia Mother    Hypertension Mother    Diabetes Mother    Heart attack Father    Early death Father 59   Hyperlipidemia Father    Hypertension Father    Heart disease Sister    Hyperlipidemia Sister    Hypertension Sister    Diabetes Brother    Heart disease Sister    Hyperlipidemia Sister    Hypertension Sister    Heart attack Sister    Heart disease Sister    Hyperlipidemia Sister    Hypertension Sister    Heart attack Brother    Early death Brother 65   Hyperlipidemia Brother    Hypertension Brother    Heart attack Brother    Early death Brother 36   Hyperlipidemia Brother    Hypertension Brother    Cancer Maternal Aunt    Colon cancer Neg Hx    Colon polyps Neg Hx      Social History Ms. Schechter reports that she has been smoking cigarettes. She started smoking about 53 years ago. She has a 50.00 pack-year smoking history. She has never used smokeless tobacco. Ms. Skeens reports no history of alcohol use.   Review of Systems CONSTITUTIONAL: No weight loss, fever, chills, weakness or fatigue.  HEENT: Eyes: No visual loss, blurred vision, double vision or yellow sclerae.No hearing loss, sneezing, congestion, runny nose or sore throat.  SKIN: No rash or itching.  CARDIOVASCULAR: per hpi RESPIRATORY: No shortness of breath, cough or sputum.  GASTROINTESTINAL: No anorexia, nausea, vomiting or diarrhea. No abdominal pain or blood.  GENITOURINARY: No burning on urination, no polyuria NEUROLOGICAL: No headache, dizziness, syncope, paralysis, ataxia, numbness or tingling in the extremities. No change in bowel or bladder control.  MUSCULOSKELETAL: No muscle, back pain, joint pain or stiffness.  LYMPHATICS: No enlarged nodes. No history of splenectomy.   PSYCHIATRIC: No history of depression or anxiety.  ENDOCRINOLOGIC: No reports of sweating, cold or heat intolerance. No polyuria or polydipsia.  Marland Kitchen   Physical Examination Vitals:   07/15/20 1353  BP: (!) 96/50  Pulse: 88  SpO2: 94%   Filed Weights   07/15/20 1353  Weight: 126 lb (57.2 kg)    Gen: resting comfortably,  no acute distress HEENT: no scleral icterus, pupils equal round and reactive, no palptable cervical adenopathy,  CV: RRR, no m/r/g, no jvd Resp: Clear to auscultation bilaterally GI: abdomen is soft, non-tender, non-distended, normal bowel sounds, no hepatosplenomegaly MSK: extremities are warm, no edema.  Skin: warm, no rash Neuro:  no focal deficits Psych: appropriate affect    Assessment and Plan   1. CAD -  Given her history of complex RCA PCI as well as carotid and PAD would favor continuing DAPT -no recent symptoms,c ontinue current meds     2. Hyperlipidemia -continue statin, upcoming labs with pcp   3. Carotid stenosis - [place]  last year looked good, continue medical therapy.    4. AAA - mild by [place], due for repeat AAA [place]    Arnoldo Lenis, M.D

## 2020-07-15 NOTE — Patient Instructions (Signed)
Medication Instructions:  Your physician recommends that you continue on your current medications as directed. Please refer to the Current Medication list given to you today.  *If you need a refill on your cardiac medications before your next appointment, please call your pharmacy*   Lab Work: None today   If you have labs (blood work) drawn today and your tests are completely normal, you will receive your results only by: [place] (if you have MyChart) OR A paper copy in the mail If you have any lab test that is abnormal or we need to change your treatment, we will call you to review the results.   Testing/Procedures:    Your physician has requested that you have an abdominal aorta duplex. During this test, an ultrasound is used to evaluate the aorta. Allow 30 minutes for this exam. Do not eat after midnight the day before and avoid carbonated beverages    Follow-Up: At [facility], you and your health needs are our priority.  As part of our continuing mission to provide you with exceptional heart care, we have created designated Provider Care Teams.  These Care Teams include your primary Cardiologist (physician) and Advanced Practice Providers (APPs -  Physician Assistants and Nurse Practitioners) who all work together to provide you with the care you need, when you need it.  We recommend signing up for the patient portal called "MyChart".  Sign up information is provided on this After Visit Summary.  MyChart is used to connect with patients for Virtual Visits (Telemedicine).  Patients are able to view lab/test results, encounter notes, upcoming appointments, etc.  Non-urgent messages can be sent to your provider as well.   To learn more about what you can do with MyChart, go to [email or web address].    Your next appointment:   6 month(s)  The format for your next appointment:   In Person  Provider:   Carlyle Dolly, MD   Other Instructions None

## 2020-07-20 ENCOUNTER — Encounter (INDEPENDENT_AMBULATORY_CARE_PROVIDER_SITE_OTHER): Payer: Self-pay | Admitting: Nurse Practitioner

## 2020-07-20 ENCOUNTER — Ambulatory Visit (INDEPENDENT_AMBULATORY_CARE_PROVIDER_SITE_OTHER): Payer: Medicare Other | Admitting: Nurse Practitioner

## 2020-07-20 ENCOUNTER — Other Ambulatory Visit: Payer: Self-pay

## 2020-07-20 VITALS — BP 86/48 | HR 96 | Temp 97.3°F | Ht 59.0 in | Wt 126.0 lb

## 2020-07-20 DIAGNOSIS — I959 Hypotension, unspecified: Secondary | ICD-10-CM

## 2020-07-20 DIAGNOSIS — Z0001 Encounter for general adult medical examination with abnormal findings: Secondary | ICD-10-CM

## 2020-07-20 DIAGNOSIS — Z0000 Encounter for general adult medical examination without abnormal findings: Secondary | ICD-10-CM

## 2020-07-20 NOTE — Progress Notes (Signed)
Subjective:   Kaitlin [name] is a 71 y.o. female who presents for Medicare Annual (Subsequent) preventive examination.  Review of Systems    Denies chest pain, shortness of breath, dizziness, lightheadedness. Positive for fatigue. Cardiac Risk Factors include: advanced age (>63men, >34 women)     Objective:    Today's Vitals   07/20/20 1308 07/20/20 1342  BP: (!) 80/48 (!) 86/48  Pulse: 96   Temp: (!) 97.3 F (36.3 C)   TempSrc: Temporal   SpO2: 99%   Weight: 126 lb (57.2 kg)   Height: 4\' 11"  (1.499 m)    Body mass index is 25.45 kg/m.  Advanced Directives 07/20/2020 05/11/2019 09/12/2018 03/14/2018 11/11/2017 08/14/2017 07/09/2017  Does Patient Have a Medical Advance Directive? No No No No No No No  Would patient like information on creating a medical advance directive? No - Patient declined No - Patient declined No - Patient declined No - Patient declined No - Patient declined No - Patient declined No - Patient declined    Current Medications (verified) Outpatient Encounter Medications as of 07/20/2020  Medication Sig   aspirin 81 MG chewable tablet Chew 81 mg by mouth daily.   cholecalciferol (VITAMIN D) 1000 units tablet Take 2 Units by mouth daily.    clopidogrel (PLAVIX) 75 MG tablet TAKE ONE (1) TABLET BY MOUTH EVERY DAY   Cyanocobalamin (VITAMIN B 12 PO) Take 1 tablet by mouth daily.    ferrous sulfate 325 (65 FE) MG tablet Take 325 mg by mouth daily with breakfast.   lisinopril (ZESTRIL) 2.5 MG tablet TAKE ONE TABLET ONCE DAILY   NITROSTAT 0.4 MG SL tablet PLACE 1 TABLET UNDER TONGUE EVERY 5 MINUTES FOR 3 DOSES AS NEEDED.   Probiotic Product (PROBIOTIC-10 PO) Take 10 mg by mouth daily.   rosuvastatin (CRESTOR) 40 MG tablet Take 1 tablet (40 mg total) by mouth daily.   No facility-administered encounter medications on file as of 07/20/2020.    Allergies (verified) Patient has no known allergies.   History: Past Medical History:  Diagnosis Date   AAA  (abdominal aortic aneurysm) (HCC)    Arthritis    Asthma    CAD (coronary artery disease)    stents   COPD (chronic obstructive pulmonary disease) (HCC)    Emphysema of lung (HCC)    GERD (gastroesophageal reflux disease)    Hyperlipidemia    Hypertension    Iliac artery stenosis, right (HCC)    60%-70%   Myocardial infarction ([place])    Normocytic anemia 01/26/2017   PAD (peripheral artery disease) ([place]) 12/14/2010   in the left vertebral, bilateral carotids   Past Surgical History:  Procedure Laterality Date   CARDIAC CATHETERIZATION  02/04/2005   A cypher 5.3 x 18 mm at 16 atmosphere of pressure in the mid LAD, this stent covered the proximal part of the LAD and also the mid LAD, this was then post dilated with 3.25 x 15 mm Quantum at 16 atmosphereof pressure for 45 seconds   CATARACT EXTRACTION Bilateral    CHOLECYSTECTOMY N/A 08/16/2017   Procedure: LAPAROSCOPIC CHOLECYSTECTOMY;  Surgeon: Aviva Signs, MD;  Location: [facility];  Service: General;  Laterality: N/A;   COLONOSCOPY N/A 03/01/2017   Procedure: COLONOSCOPY;  Surgeon: Danie Binder, MD;  Location: [facility];  Service: Endoscopy;  Laterality: N/A;  1:00pm   ESOPHAGOGASTRODUODENOSCOPY N/A 01/28/2017   Procedure: ESOPHAGOGASTRODUODENOSCOPY (EGD);  Surgeon: Jerene Bears, MD;  Location: [facility] ENDOSCOPY;  Service: Gastroenterology;  Laterality: N/A;  GIVENS CAPSULE STUDY N/A 03/12/2017   Procedure: GIVENS CAPSULE STUDY;  Surgeon: Danie Binder, MD;  Location: [facility];  Service: Endoscopy;  Laterality: N/A;  7:30am   TUBAL LIGATION     Family History  Problem Relation Age of Onset   Heart attack Mother    Hyperlipidemia Mother    Hypertension Mother    Diabetes Mother    Heart attack Father    Early death Father 32   Hyperlipidemia Father    Hypertension Father    Heart disease Sister    Hyperlipidemia Sister    Hypertension Sister    Diabetes Brother    Heart disease Sister    Hyperlipidemia Sister     Hypertension Sister    Heart attack Sister    Heart disease Sister    Hyperlipidemia Sister    Hypertension Sister    Heart attack Brother    Early death Brother 50   Hyperlipidemia Brother    Hypertension Brother    Heart attack Brother    Early death Brother 15   Hyperlipidemia Brother    Hypertension Brother    Cancer Maternal Aunt    Colon cancer Neg Hx    Colon polyps Neg Hx    Social History   Socioeconomic History   Marital status: Married    Spouse name: Marcello Moores   Number of children: 3   Years of education: 36   Highest education level: Not on file  Occupational History   Occupation: RETIRED    Comment: warehouse/textiles  Tobacco Use   Smoking status: Every Day    Packs/day: 1.00    Years: 50.00    Pack years: 50.00    Types: Cigarettes    Start date: 02/09/1967   Smokeless tobacco: Never  Vaping Use   Vaping Use: Never used  Substance and Sexual Activity   Alcohol use: No   Drug use: No   Sexual activity: Yes    Birth control/protection: Post-menopausal  Other Topics Concern   Not on file  Social History Narrative   Retired   Has an [employer] in [employer] with husband Marcello Moores for 23 years   Stays busy with home, gardens, likes to can   Social Determinants of Radio broadcast assistant Strain: Not on file  Food Insecurity: Not on file  Transportation Needs: Not on file  Physical Activity: Not on file  Stress: Not on file  Social Connections: Not on file    Tobacco Counseling Ready to quit: Not Answered Counseling given: Yes   Clinical Intake:  Pre-visit preparation completed: No  Pain : No/denies pain     BMI - recorded: 25.45 Nutritional Status: BMI 25 -29 Overweight Nutritional Risks: None Diabetes: No  How often do you need to have someone help you when you read instructions, pamphlets, or other written materials from your doctor or pharmacy?: 1 - Never What is the last grade level you completed in school?: 2 years of  college  Diabetic?NO  Interpreter Needed?: No  Information entered by :: Jeralyn Ruths, NP-C   Activities of Daily Living In your present state of health, do you have any difficulty performing the following activities: 07/20/2020  Hearing? N  Vision? N  Difficulty concentrating or making decisions? N  Walking or climbing stairs? N  Dressing or bathing? N  Doing errands, shopping? N  Preparing Food and eating ? N  Using the Toilet? N  In the past six months, have you accidently  leaked urine? N  Do you have problems with loss of bowel control? N  Managing your Medications? N  Managing your Finances? N  Housekeeping or managing your Housekeeping? N  Some recent data might be hidden    Patient Care Team: Doree Albee, MD as PCP - General (Internal Medicine) Branch, Alphonse Guild, MD as PCP - Cardiology (Cardiology) Harl Bowie Alphonse Guild, MD as Consulting Physician (Cardiology) Danie Binder, MD (Inactive) as Consulting Physician (Gastroenterology) Eloise Harman, DO as Consulting Physician (Internal Medicine)  Indicate any recent Medical Services you may have received from other than [facility] providers in the past year (date may be approximate).     Assessment:   This is a routine wellness examination for Kaitlin [name].  Hearing/Vision screen No results found.  Dietary issues and exercise activities discussed: Current Exercise Habits: Home exercise routine, Type of exercise: walking, Time (Minutes): 60, Frequency (Times/Week): 7, Weekly Exercise (Minutes/Week): 420, Intensity: Moderate, Exercise limited by: None identified   Goals Addressed   None    Depression Screen PHQ 2/9 Scores 07/20/2020 05/11/2019 02/02/2019 02/04/2017 10/25/2016  PHQ - 2 Score 0 0 0 0 0  PHQ- 9 Score 0 - - - -  Exception Documentation - Medical reason Medical reason - -    Fall Risk Fall Risk  07/20/2020 05/11/2019 02/02/2019 02/04/2017 10/25/2016  Falls in the past year? 0 0 1 No No  Number falls in past yr: - 0  0 - -  Injury with Fall? - 0 0 - -  Comment - - fell in bath room; not injury. Just felt stupid for not cutting light on. - -  Risk for fall due to : - No Fall Risks - - -  Follow up - Falls evaluation completed Falls evaluation completed - -    FALL RISK PREVENTION PERTAINING TO THE HOME:  Any stairs in or around the home? Yes  If so, are there any without handrails? No  Home free of loose throw rugs in walkways, pet beds, electrical cords, etc? Yes  Adequate lighting in your home to reduce risk of falls? Yes   ASSISTIVE DEVICES UTILIZED TO PREVENT FALLS:  Life alert? No  Use of a cane, walker or w/c? Yes  Grab bars in the bathroom? No  Shower chair or bench in shower? Yes  Elevated toilet seat or a handicapped toilet? No   TIMED UP AND GO:  Was the test performed? Yes .  Length of time to ambulate 10 feet: 7 sec.   Gait slow and steady with assistive device  Cognitive Function:     6CIT Screen 07/20/2020 05/11/2019  What Year? 0 points 0 points  What month? 0 points 0 points  What time? 0 points 0 points  Count back from 20 0 points 0 points  Months in reverse 0 points 0 points  Repeat phrase 0 points 0 points  Total Score 0 0    Immunizations Immunization History  Administered Date(s) Administered   Influenza,inj,Quad PF,6+ Mos 10/25/2016, 12/04/2018   Moderna Sars-Covid-2 Vaccination 03/25/2019, 04/22/2019   Tdap 01/30/2011    TDAP status: Up to date  Flu Vaccine status: Up to date  Pneumococcal vaccine status: Declined,  Education has been provided regarding the importance of this vaccine but patient still declined. Advised may receive this vaccine at local pharmacy or Health Dept. Aware to provide a copy of the vaccination record if obtained from local pharmacy or Health Dept. Verbalized acceptance and understanding.   Covid-19 vaccine status:  Completed vaccines  Qualifies for Shingles Vaccine? Yes   Zostavax completed No   Shingrix Completed?: No.     Education has been provided regarding the importance of this vaccine. Patient has been advised to call insurance company to determine out of pocket expense if they have not yet received this vaccine. Advised may also receive vaccine at local pharmacy or Health Dept. Verbalized acceptance and understanding.  Screening Tests Health Maintenance  Topic Date Due   COVID-19 Vaccine (3 - Booster for Moderna series) 09/22/2019   Zoster Vaccines- Shingrix (1 of 2) 10/20/2020 (Originally 10/25/2001)   DEXA SCAN  07/20/2021 (Originally 10/25/2016)   Hepatitis C Screening  07/20/2021 (Originally 10/25/1969)   PNA vac Low Risk Adult (1 of 2 - PCV13) 07/20/2021 (Originally 10/25/2016)   INFLUENZA VACCINE  08/29/2020   TETANUS/TDAP  01/29/2021   MAMMOGRAM  05/26/2021   COLONOSCOPY (Pts 45-50yrs Insurance coverage will need to be confirmed)  03/02/2027   HPV VACCINES  Aged Out    Health Maintenance  Health Maintenance Due  Topic Date Due   COVID-19 Vaccine (3 - Booster for Moderna series) 09/22/2019    Colorectal cancer screening: Type of screening: Colonoscopy. Completed 2019. Repeat every 10 years  Mammogram status: Completed 04/2019. Repeat every year - Patient prefers to do screening every other year  Bone Density: completed 2008, normal.  Lung Cancer Screening: (Low Dose CT Chest recommended if Age 39-80 years, 30 pack-year currently smoking OR have quit w/in 15years.) does qualify.   Lung Cancer Screening Referral: Patient would like to hold off on repeat screening for 1 more year  Additional Screening:  Hepatitis C Screening: does qualify; Completed Patient declined  Vision Screening: Recommended annual ophthalmology exams for early detection of glaucoma and other disorders of the eye. Is the patient up to date with their annual eye exam?  No  Who is the provider or what is the name of the office in which the patient attends annual eye exams? Patient unsure name If pt is not established  with a provider, would they like to be referred to a provider to establish care? No .   Dental Screening: Recommended annual dental exams for proper oral hygiene  Community Resource Referral / Chronic Care Management: CRR required this visit?  No   CCM required this visit?  No   Objective Assessment: General: Patient is in no acute distress, dressed appropriate for weather, and appears systemically well Chest/lungs: Lung sounds clear throughout.  No respiratory distress. Cardiovascular: RRR.  S1, S2 intact.  No murmurs, no extra sounds, Neurological: Coordination intact, normal gait.  Alert and oriented x3.    Plan:    Patient is quite hypotensive upon rooming.  Blood pressure recheck showed that she still hypotensive even after sitting in the room for a few minutes.  She is asymptomatic and denies any chest pain, dizziness, lightheadedness, or shortness of breath.  She does report some fatigue.  She tells me she does walk regularly throughout the day and admittedly does not drink much water.  She tells me she has had a history of dehydration which resulted in hypotension.  She is on lisinopril 2.5 mg daily but no other antihypertensives.  She is willing to drink fluids extensively tonight and into tomorrow and come back tomorrow morning for a blood pressure recheck.  I recommend she hold her lisinopril tomorrow until her visit with me.  We will also reach out to cardiologist to see if we could consider stopping her lisinopril.  She was told if she experiences any symptoms of hypotension (dizziness, chest pain, shortness of breath, etc.) between now and tomorrow's appointment that she needs to proceed to the emergency department.  We will check her metabolic panel to monitor electrolyte levels and kidney function as well today. She is agreeable to this.  I have also discussed this with my supervising physician Dr. Anastasio Champion.   As for her annual wellness visit she would like to hold off on lung  cancer screening repeat until next year, mammogram for breast cancer screening until next year, she is not interested in having hepatitis C screening completed.  She would qualify for shingles vaccine as well as pneumonia vaccines but she is going to hold off on each right now.  She is due for COVID-19 booster and will consider getting this in the near future.  I have personally reviewed and noted the following in the patient's chart:   Medical and social history Use of alcohol, tobacco or illicit drugs  Current medications and supplements including opioid prescriptions.  Functional ability and status Nutritional status Physical activity Advanced directives List of other physicians Hospitalizations, surgeries, and ER visits in previous 12 months Vitals Screenings to include cognitive, depression, and falls Referrals and appointments  In addition, I have reviewed and discussed with patient certain preventive protocols, quality metrics, and best practice recommendations. A written personalized care plan for preventive services as well as general preventive health recommendations were provided to patient.   She will follow-up tomorrow, and we will schedule annual wellness visit again next year.  In addition to performing her annual wellness visit today also performed an office visit.  Ailene Ards, NP   07/20/2020

## 2020-07-20 NOTE — Patient Instructions (Signed)
  Ms. Bouchie , Thank you for taking time to come for your Medicare Wellness Visit. I appreciate your ongoing commitment to your health goals. Please review the following plan we discussed and let me know if I can assist you in the future.   These are the goals we discussed:  Goals      STOP SMOKING        This is a list of the screening recommended for you and due dates:  Health Maintenance  Topic Date Due   COVID-19 Vaccine (3 - Booster for Moderna series) 09/22/2019   Zoster (Shingles) Vaccine (1 of 2) 10/20/2020*   DEXA scan (bone density measurement)  07/20/2021*   Hepatitis C Screening: USPSTF Recommendation to screen - Ages 18-79 yo.  07/20/2021*   Pneumonia vaccines (1 of 2 - PCV13) 07/20/2021*   Flu Shot  08/29/2020   Tetanus Vaccine  01/29/2021   Mammogram  05/26/2021   Colon Cancer Screening  03/02/2027   HPV Vaccine  Aged Out  *Topic was postponed. The date shown is not the original due date.

## 2020-07-21 ENCOUNTER — Encounter (INDEPENDENT_AMBULATORY_CARE_PROVIDER_SITE_OTHER): Payer: Self-pay | Admitting: Nurse Practitioner

## 2020-07-21 ENCOUNTER — Ambulatory Visit (INDEPENDENT_AMBULATORY_CARE_PROVIDER_SITE_OTHER): Payer: Medicare Other | Admitting: Nurse Practitioner

## 2020-07-21 VITALS — BP 122/54 | HR 82 | Temp 97.1°F | Ht 61.0 in | Wt 127.8 lb

## 2020-07-21 DIAGNOSIS — I959 Hypotension, unspecified: Secondary | ICD-10-CM

## 2020-07-21 DIAGNOSIS — D649 Anemia, unspecified: Secondary | ICD-10-CM | POA: Diagnosis not present

## 2020-07-21 LAB — BASIC METABOLIC PANEL WITH GFR
BUN/Creatinine Ratio: 12 (calc) (ref 6–22)
BUN: 26 mg/dL — ABNORMAL HIGH (ref 7–25)
CO2: 26 mmol/L (ref 20–32)
Calcium: 9.2 mg/dL (ref 8.6–10.4)
Chloride: 106 mmol/L (ref 98–110)
Creat: 2.25 mg/dL — ABNORMAL HIGH (ref 0.50–0.99)
GFR, Est African American: 25 mL/min/{1.73_m2} — ABNORMAL LOW (ref >=60)
GFR, Est Non African American: 22 mL/min/{1.73_m2} — ABNORMAL LOW (ref >=60)
Glucose, Bld: 93 mg/dL (ref 65–139)
Potassium: 4 mmol/L (ref 3.5–5.3)
Sodium: 142 mmol/L (ref 135–146)

## 2020-07-21 LAB — CBC
HCT: 28.9 % — ABNORMAL LOW (ref 35.0–45.0)
Hemoglobin: 9.4 g/dL — ABNORMAL LOW (ref 11.7–15.5)
MCH: 33 pg (ref 27.0–33.0)
MCHC: 32.5 g/dL (ref 32.0–36.0)
MCV: 101.4 fL — ABNORMAL HIGH (ref 80.0–100.0)
MPV: 10.2 fL (ref 7.5–12.5)
Platelets: 164 10*3/uL (ref 140–400)
RBC: 2.85 10*6/uL — ABNORMAL LOW (ref 3.80–5.10)
RDW: 12.2 % (ref 11.0–15.0)
WBC: 4.5 10*3/uL (ref 3.8–10.8)

## 2020-07-21 NOTE — Progress Notes (Signed)
Subjective:  Patient ID: Kaitlin [name], female    DOB: Dec 12, 1950  Age: 70 y.o. MRN: [id number]  CC:  Chief Complaint  Patient presents with   Follow-up    Patient has been drinking water and feeling better   Other    Hypotension      HPI  This patient arrives today for the above.  Hypertension: She was seen yesterday for follow-up/AWV and was found to be very hypotensive.  She was asymptomatic at that time except for some mild fatigue.  She was told to drink plenty of water overnight we did check a metabolic panel for further evaluation which did show probable dehydration.  She does follow with nephrology for chronic kidney disease.  Today, she tells me she did drink quite a few water bottles between yesterday and today and she is feeling much better.  She tells me she does not have any fatigue whatsoever.  She does have a history of a AAA and is scheduled to undergo ultrasound for further evaluation of this tomorrow.  She did hold her lisinopril today.  I did reach out to her cardiologist yesterday but not heard back about discontinuing this medication altogether.  I have consulted with Dr. Anastasio Champion regarding this case as well.  Past Medical History:  Diagnosis Date   AAA (abdominal aortic aneurysm) (HCC)    Arthritis    Asthma    CAD (coronary artery disease)    stents   COPD (chronic obstructive pulmonary disease) (HCC)    Emphysema of lung (HCC)    GERD (gastroesophageal reflux disease)    Hyperlipidemia    Hypertension    Iliac artery stenosis, right (HCC)    60%-70%   Myocardial infarction ([place])    Normocytic anemia 01/26/2017   PAD (peripheral artery disease) ([place]) 12/14/2010   in the left vertebral, bilateral carotids      Family History  Problem Relation Age of Onset   Heart attack Mother    Hyperlipidemia Mother    Hypertension Mother    Diabetes Mother    Heart attack Father    Early death Father 12   Hyperlipidemia Father    Hypertension Father     Heart disease Sister    Hyperlipidemia Sister    Hypertension Sister    Diabetes Brother    Heart disease Sister    Hyperlipidemia Sister    Hypertension Sister    Heart attack Sister    Heart disease Sister    Hyperlipidemia Sister    Hypertension Sister    Heart attack Brother    Early death Brother 22   Hyperlipidemia Brother    Hypertension Brother    Heart attack Brother    Early death Brother 54   Hyperlipidemia Brother    Hypertension Brother    Cancer Maternal Aunt    Colon cancer Neg Hx    Colon polyps Neg Hx     Social History   Social History Narrative   Retired   Has an [place] in [employer] with husband Marcello Moores for 23 years   Stays busy with home, gardens, likes to can   Social History   Tobacco Use   Smoking status: Every Day    Packs/day: 1.00    Years: 50.00    Pack years: 50.00    Types: Cigarettes    Start date: 02/09/1967   Smokeless tobacco: Never  Substance Use Topics   Alcohol use: No     Current  Meds  Medication Sig   aspirin 81 MG chewable tablet Chew 81 mg by mouth daily.   cholecalciferol (VITAMIN D) 1000 units tablet Take 2 Units by mouth daily.    clopidogrel (PLAVIX) 75 MG tablet TAKE ONE (1) TABLET BY MOUTH EVERY DAY   Cyanocobalamin (VITAMIN B 12 PO) Take 1 tablet by mouth daily.    ferrous sulfate 325 (65 FE) MG tablet Take 325 mg by mouth daily with breakfast.   NITROSTAT 0.4 MG SL tablet PLACE 1 TABLET UNDER TONGUE EVERY 5 MINUTES FOR 3 DOSES AS NEEDED.   Probiotic Product (PROBIOTIC-10 PO) Take 10 mg by mouth daily.   rosuvastatin (CRESTOR) 40 MG tablet Take 1 tablet (40 mg total) by mouth daily.   [DISCONTINUED] lisinopril (ZESTRIL) 2.5 MG tablet TAKE ONE TABLET ONCE DAILY    ROS:  Review of Systems  Constitutional:  Negative for malaise/fatigue.  Eyes:  Negative for blurred vision.  Cardiovascular:  Negative for chest pain.  Gastrointestinal:  Negative for abdominal pain.  Neurological:  Negative for dizziness.     Objective:   Today's Vitals: BP (!) 122/54 Comment: using small cuff (appropriate size for patient's body habitus)  Pulse 82   Temp (!) 97.1 F (36.2 C) (Temporal)   Ht 5\' 1"  (1.549 m)   Wt 127 lb 12.8 oz (58 kg)   SpO2 98%   BMI 24.15 kg/m  Vitals with BMI 07/21/2020 07/21/2020 07/20/2020  Height - 5\' 1"  -  Weight - 127 lbs 13 oz -  BMI - 17.40 -  Systolic 814 92 86  Diastolic 54 62 48  Pulse - 82 -     Physical Exam Vitals reviewed.  Constitutional:      General: She is not in acute distress.    Appearance: Normal appearance.  HENT:     Head: Normocephalic and atraumatic.  Neck:     Vascular: No carotid bruit.  Cardiovascular:     Rate and Rhythm: Normal rate and regular rhythm.     Pulses: Normal pulses.     Heart sounds: Normal heart sounds.  Pulmonary:     Effort: Pulmonary effort is normal.     Breath sounds: Normal breath sounds.  Abdominal:     General: Abdomen is flat. Bowel sounds are normal. There is no distension or abdominal bruit.     Palpations: Abdomen is soft. There is no splenomegaly, mass or pulsatile mass.     Tenderness: There is no abdominal tenderness.  Skin:    General: Skin is warm and dry.  Neurological:     General: No focal deficit present.     Mental Status: She is alert and oriented to person, place, and time.  Psychiatric:        Mood and Affect: Mood normal.        Behavior: Behavior normal.        Judgment: Judgment normal.         Assessment and Plan   1. Hypotension, unspecified hypotension type   2. Anemia, unspecified type      Plan: 1.,  2.  This seems to have resolved with patient drinking plenty of water between yesterday and today.  After consulting with Dr. Anastasio Champion he did recommend checking a CBC just to make sure there is no signs of bleeding as well as complete abdominal exam.  Abdominal exam was within normal limits.  We will check CBC today for further evaluation, however I think most likely cause of her  hypotension  was dehydration which seems to have resolved.  I will still wait to hear back from Dr. Harl Bowie regarding the patient's lisinopril but for now both myself and Dr. Anastasio Champion agree that the patient should discontinue lisinopril.  May restart this pending discussion with Dr. Harl Bowie.  Patient will plan to follow-up with Dr. Anastasio Champion in approximately 3 months.   Tests ordered Orders Placed This Encounter  Procedures   CBC      No orders of the defined types were placed in this encounter.   Patient to follow-up in 3 months or sooner as needed.  Ailene Ards, NP

## 2020-07-22 ENCOUNTER — Other Ambulatory Visit: Payer: Self-pay

## 2020-07-22 ENCOUNTER — Ambulatory Visit (HOSPITAL_COMMUNITY)
Admission: RE | Admit: 2020-07-22 | Discharge: 2020-07-22 | Disposition: A | Discharge status: Home / Self Care | Payer: Medicare Other | Source: Ambulatory Visit | Attending: Cardiology | Admitting: Cardiology

## 2020-07-22 ENCOUNTER — Telehealth (INDEPENDENT_AMBULATORY_CARE_PROVIDER_SITE_OTHER): Payer: Self-pay | Admitting: Nurse Practitioner

## 2020-07-22 DIAGNOSIS — I714 Abdominal aortic aneurysm, without rupture, unspecified: Secondary | ICD-10-CM

## 2020-07-22 NOTE — Telephone Encounter (Signed)
Please call this patient and verify that she has a kidney doctor that she sees on a routine basis. Please ask the name of the kidney doctor and when she saw him/her last. If she does not have one, I feel she needs to see one so I will order a referral to nephrology.

## 2020-07-26 ENCOUNTER — Other Ambulatory Visit (INDEPENDENT_AMBULATORY_CARE_PROVIDER_SITE_OTHER): Payer: Self-pay | Admitting: Nurse Practitioner

## 2020-07-26 ENCOUNTER — Telehealth: Payer: Self-pay

## 2020-07-26 DIAGNOSIS — D539 Nutritional anemia, unspecified: Secondary | ICD-10-CM

## 2020-07-26 NOTE — Progress Notes (Signed)
Patient called.  Unable to reach patient.Pt will try to call her again today.

## 2020-07-26 NOTE — Telephone Encounter (Signed)
Pt was notified of results and verbalized understanding. Pt had no questions or concerns at this time.

## 2020-07-26 NOTE — Telephone Encounter (Signed)
-----   Message from Arnoldo Lenis, MD sent at 07/26/2020 12:48 PM EDT ----- Aneurysm remains mild, we will continue to monitor  Zandra Abts MD

## 2020-07-27 NOTE — Progress Notes (Signed)
Been calling but not able to contact her, now 2 days.

## 2020-07-28 NOTE — Telephone Encounter (Signed)
Please see lab results note  

## 2020-08-02 ENCOUNTER — Other Ambulatory Visit (INDEPENDENT_AMBULATORY_CARE_PROVIDER_SITE_OTHER): Payer: Self-pay | Admitting: Nurse Practitioner

## 2020-08-02 DIAGNOSIS — N184 Chronic kidney disease, stage 4 (severe): Secondary | ICD-10-CM

## 2020-08-02 NOTE — Telephone Encounter (Signed)
Sure, here is the message from her lab result notes:  Called patient and gave her the lab results. Patient would like a referral to go back to the nephrologist on [facility] as she has not seen one since and she thinks the other doctor retired. She is OK to have referral placed. Also scheduled patient for lab appointment on 08/15/2020 at 8am. Patient verbalizedan understanding.

## 2020-08-15 ENCOUNTER — Other Ambulatory Visit (INDEPENDENT_AMBULATORY_CARE_PROVIDER_SITE_OTHER): Payer: Medicare Other

## 2020-08-16 ENCOUNTER — Other Ambulatory Visit (INDEPENDENT_AMBULATORY_CARE_PROVIDER_SITE_OTHER): Payer: Medicare Other

## 2020-08-16 ENCOUNTER — Other Ambulatory Visit: Payer: Self-pay

## 2020-08-16 DIAGNOSIS — D539 Nutritional anemia, unspecified: Secondary | ICD-10-CM | POA: Diagnosis not present

## 2020-08-16 LAB — CBC WITH DIFFERENTIAL/PLATELET
Absolute Monocytes: 312 cells/uL (ref 200–950)
Basophils Absolute: 48 cells/uL (ref 0–200)
Basophils Relative: 1 %
Eosinophils Absolute: 211 cells/uL (ref 15–500)
Eosinophils Relative: 4.4 %
HCT: 33.3 % — ABNORMAL LOW (ref 35.0–45.0)
Hemoglobin: 10.7 g/dL — ABNORMAL LOW (ref 11.7–15.5)
Lymphs Abs: 1416 cells/uL (ref 850–3900)
MCH: 32.8 pg (ref 27.0–33.0)
MCHC: 32.1 g/dL (ref 32.0–36.0)
MCV: 102.1 fL — ABNORMAL HIGH (ref 80.0–100.0)
MPV: 10 fL (ref 7.5–12.5)
Monocytes Relative: 6.5 %
Neutro Abs: 2813 cells/uL (ref 1500–7800)
Neutrophils Relative %: 58.6 %
Platelets: 187 10*3/uL (ref 140–400)
RBC: 3.26 10*6/uL — ABNORMAL LOW (ref 3.80–5.10)
RDW: 12.4 % (ref 11.0–15.0)
Total Lymphocyte: 29.5 %
WBC: 4.8 10*3/uL (ref 3.8–10.8)

## 2020-08-16 LAB — VITAMIN B12: Vitamin B-12: 699 pg/mL (ref 200–1100)

## 2020-08-16 LAB — FOLATE: Folate: 9.8 ng/mL

## 2020-08-17 ENCOUNTER — Other Ambulatory Visit (INDEPENDENT_AMBULATORY_CARE_PROVIDER_SITE_OTHER): Payer: Self-pay | Admitting: Nurse Practitioner

## 2020-08-17 DIAGNOSIS — D509 Iron deficiency anemia, unspecified: Secondary | ICD-10-CM

## 2020-08-29 NOTE — Progress Notes (Signed)
[place], [place] [address]   CLINIC:  Medical Oncology/Hematology  PCP:  Doree Albee, MD [address] [place] [address] [phone]   REASON FOR VISIT:  Follow-up for normocytic anemia  CURRENT THERAPY: Oral iron supplementation  INTERVAL HISTORY:  Kaitlin [name] 70 y.o. female returns for routine follow-up of her normocytic anemia.  She was previously seen at the hematology clinic by NP Reynolds Bowl on 09/12/2018, but was lost to follow-up for the past two years.  She returns today to re-establish care for ongoing management of her anemia.  At today's visit, she reports feeling well.  No recent hospitalizations, surgeries, or changes in baseline health status.  Kaitlin [name] denies any signs or symptoms of blood loss; no epistaxis, hematemesis, hematochezia, melena, or hematuria.  She has chronic fatigue, reports that her energy is about 60% on average, but that she is still able to complete all of her ADLs.  She denies chest pain, dyspnea on exertion, palpitations, or syncope.  No B symptoms such as fever, chills, night sweats, unintentional weight loss.  She reports that she takes ferrous sulfate, vitamin B12, and vitamin D3 at home.  She has 60% energy and 100% appetite. She endorses that she is maintaining a stable weight.    REVIEW OF SYSTEMS:  Review of Systems  Constitutional:  Positive for fatigue. Negative for appetite change, chills, diaphoresis, fever and unexpected weight change.  HENT:   Negative for lump/mass and nosebleeds.   Eyes:  Negative for eye problems.  Respiratory:  Negative for cough, hemoptysis and shortness of breath.   Cardiovascular:  Negative for chest pain, leg swelling and palpitations.  Gastrointestinal:  Negative for abdominal pain, blood in stool, constipation, diarrhea, nausea and vomiting.  Genitourinary:  Negative for hematuria.   Skin: Negative.   Neurological:  Positive for numbness (Chronic  left hand numbness). Negative for dizziness, headaches and light-headedness.  Hematological:  Does not bruise/bleed easily.     PAST MEDICAL/SURGICAL HISTORY:  Past Medical History:  Diagnosis Date   AAA (abdominal aortic aneurysm) (HCC)    Arthritis    Asthma    CAD (coronary artery disease)    stents   COPD (chronic obstructive pulmonary disease) (HCC)    Emphysema of lung (HCC)    GERD (gastroesophageal reflux disease)    Hyperlipidemia    Hypertension    Iliac artery stenosis, right (HCC)    60%-70%   Myocardial infarction ([place])    Normocytic anemia 01/26/2017   PAD (peripheral artery disease) ([facility]) 12/14/2010   in the left vertebral, bilateral carotids   Past Surgical History:  Procedure Laterality Date   CARDIAC CATHETERIZATION  02/04/2005   A cypher 5.3 x 18 mm at 16 atmosphere of pressure in the mid LAD, this stent covered the proximal part of the LAD and also the mid LAD, this was then post dilated with 3.25 x 15 mm Quantum at 16 atmosphereof pressure for 45 seconds   CATARACT EXTRACTION Bilateral    CHOLECYSTECTOMY N/A 08/16/2017   Procedure: LAPAROSCOPIC CHOLECYSTECTOMY;  Surgeon: Aviva Signs, MD;  Location: [facility];  Service: General;  Laterality: N/A;   COLONOSCOPY N/A 03/01/2017   Procedure: COLONOSCOPY;  Surgeon: Danie Binder, MD;  Location: [facility] ENDO SU[facility];  Service: Endoscopy;  Laterality: N/A;  1:00pm   ESOPHAGOGASTRODUODENOSCOPY N/A 01/28/2017   Procedure: ESOPHAGOGASTRODUODENOSCOPY (EGD);  Surgeon: Jerene Bears, MD;  Location: [facility] ENDOSCOPY;  Service: Gastroenterology;  Laterality: N/A;  GIVENS CAPSULE STUDY N/A 03/12/2017   Procedure: GIVENS CAPSULE STUDY;  Surgeon: Danie Binder, MD;  Location: [facility] END[facility] SU[facility];  Service: Endoscopy;  Laterality: N/A;  7:30am   TUBAL LIGATION       SOCIAL HISTORY:  Social History   Socioeconomic History   Marital status: Married    Spouse name: Marcello Moores   Number of children: 3   Years of education: 14   Highest  education level: Not on file  Occupational History   Occupation: RETIRED    Comment: warehouse/textiles  Tobacco Use   Smoking status: Every Day    Packs/day: 1.00    Years: 50.00    Pack years: 50.00    Types: Cigarettes    Start date: 02/09/1967   Smokeless tobacco: Never  Vaping Use   Vaping Use: Never used  Substance and Sexual Activity   Alcohol use: No   Drug use: No   Sexual activity: Yes    Birth control/protection: Post-menopausal  Other Topics Concern   Not on file  Social History Narrative   Retired   Has an [employer] in [employer] with husband Marcello Moores for 25 years   Stays busy with home, gardens, likes to can   Social Determinants of Radio broadcast assistant Strain: Not on file  Food Insecurity: Not on file  Transportation Needs: Not on file  Physical Activity: Not on file  Stress: Not on file  Social Connections: Not on file  Intimate Partner Violence: Not on file    FAMILY HISTORY:  Family History  Problem Relation Age of Onset   Heart attack Mother    Hyperlipidemia Mother    Hypertension Mother    Diabetes Mother    Heart attack Father    Early death Father 83   Hyperlipidemia Father    Hypertension Father    Heart disease Sister    Hyperlipidemia Sister    Hypertension Sister    Diabetes Brother    Heart disease Sister    Hyperlipidemia Sister    Hypertension Sister    Heart attack Sister    Heart disease Sister    Hyperlipidemia Sister    Hypertension Sister    Heart attack Brother    Early death Brother 31   Hyperlipidemia Brother    Hypertension Brother    Heart attack Brother    Early death Brother 16   Hyperlipidemia Brother    Hypertension Brother    Cancer Maternal Aunt    Colon cancer Neg Hx    Colon polyps Neg Hx     CURRENT MEDICATIONS:  Outpatient Encounter Medications as of 08/30/2020  Medication Sig   aspirin 81 MG chewable tablet Chew 81 mg by mouth daily.   cholecalciferol (VITAMIN D) 1000 units tablet Take 2 Units  by mouth daily.    clopidogrel (PLAVIX) 75 MG tablet TAKE ONE (1) TABLET BY MOUTH EVERY DAY   Cyanocobalamin (VITAMIN B 12 PO) Take 1 tablet by mouth daily.    ferrous sulfate 325 (65 FE) MG tablet Take 325 mg by mouth daily with breakfast.   NITROSTAT 0.4 MG SL tablet PLACE 1 TABLET UNDER TONGUE EVERY 5 MINUTES FOR 3 DOSES AS NEEDED.   Probiotic Product (PROBIOTIC-10 PO) Take 10 mg by mouth daily.   rosuvastatin (CRESTOR) 40 MG tablet Take 1 tablet (40 mg total) by mouth daily.   No facility-administered encounter medications on file as of 08/30/2020.    ALLERGIES:  No Known Allergies  PHYSICAL EXAM:  ECOG PERFORMANCE STATUS: 1 - Symptomatic but completely ambulatory  There were no vitals filed for this visit. There were no vitals filed for this visit. Physical Exam Constitutional:      Appearance: Normal appearance.  HENT:     Head: Normocephalic and atraumatic.     Mouth/Throat:     Mouth: Mucous membranes are moist.  Eyes:     Extraocular Movements: Extraocular movements intact.     Pupils: Pupils are equal, round, and reactive to light.  Cardiovascular:     Rate and Rhythm: Normal rate and regular rhythm.     Pulses: Normal pulses.     Heart sounds: Normal heart sounds.  Pulmonary:     Effort: Pulmonary effort is normal.     Breath sounds: Normal breath sounds.  Abdominal:     General: Bowel sounds are normal.     Palpations: Abdomen is soft.     Tenderness: There is no abdominal tenderness.  Musculoskeletal:        General: No swelling.     Right lower leg: No edema.     Left lower leg: No edema.  Lymphadenopathy:     Cervical: No cervical adenopathy.  Skin:    General: Skin is warm and dry.  Neurological:     General: No focal deficit present.     Mental Status: She is alert and oriented to person, place, and time.  Psychiatric:        Mood and Affect: Mood normal.        Behavior: Behavior normal.     LABORATORY DATA:  I have reviewed the labs as  listed.  CBC    Component Value Date/Time   WBC 4.8 08/16/2020 0818   RBC 3.26 (L) 08/16/2020 0818   HGB 10.7 (L) 08/16/2020 0818   HCT 33.3 (L) 08/16/2020 0818   PLT 187 08/16/2020 0818   MCV 102.1 (H) 08/16/2020 0818   MCH 32.8 08/16/2020 0818   MCHC 32.1 08/16/2020 0818   RDW 12.4 08/16/2020 0818   LYMPHSABS 1,416 08/16/2020 0818   MONOABS 0.3 09/05/2018 1146   EOSABS 211 08/16/2020 0818   BASOSABS 48 08/16/2020 0818   CMP Latest Ref Rng & Units 07/20/2020 08/13/2019 02/02/2019  Glucose 65 - 139 mg/dL 93 120(H) 85  BUN 7 - 25 mg/dL 26(H) 16 22  Creatinine 0.50 - 0.99 mg/dL 2.25(H) 1.70(H) 1.70(H)  Sodium 135 - 146 mmol/L 142 143 144  Potassium 3.5 - 5.3 mmol/L 4.0 3.6 4.1  Chloride 98 - 110 mmol/L 106 106 109  CO2 20 - 32 mmol/L '26 30 22  ' Calcium 8.6 - 10.4 mg/dL 9.2 9.0 9.5  Total Protein 6.1 - 8.1 g/dL - 6.1 6.8  Total Bilirubin 0.2 - 1.2 mg/dL - 0.3 0.3  Alkaline Phos 38 - 126 U/L - - -  AST 10 - 35 U/L - 10 12  ALT 6 - 29 U/L - 8 9    DIAGNOSTIC IMAGING:  I have independently reviewed the relevant imaging and discussed with the patient.  ASSESSMENT & PLAN: 1.  Macrocytic anemia  - Previously seen by hematology for normocytic anemia secondary to CKD, iron deficiency, and B12 deficiency - She was lost to follow-up after August 2020 - She has been referred back to [place] to reestablish care (July 2022) due to macrocytic anemia - EGD (01/28/2017): Gastritis, normal duodenum - Colonoscopy (03/01/2017): Redundant colon, large rectal polyp removed, external hemorrhoids - Review of most recent labs (08/16/2020): Hgb 10.7  with MCV 102.1, normal platelets, WBC, differential; normal folate 9.8, normal B12 699 -Symptomatic with fatigue.  Denies any signs or symptoms of blood loss. -She takes vitamin D3, vitamin B12, and ferrous sulfate supplements at home  - Differential diagnosis favors anemia related to CKD (most recent CMP on 08/16/2020 with creatinine 2.25 and EGFR 22); additional  work-up as below due to macrocytosis - Consider initiation of ESA if hemoglobin < 10.0; will consider bone marrow biopsy if patient develops worsening anemia that is refractory to ESA - PLAN: Labs today to check additional labs for anemia work-up (repeat B12 with methylmalonic acid, copper, iron panel, reticulocytes, LDH, TSH, MGUS panel).  Phone visit in 4 weeks to discuss results.  2.  CKD stage III/IV - Worsening creatinine noted by PCP on most recent labs (creatinine 2.25, EGFR 22) - Patient has been referred to nephrology by her primary care provider - PLAN: Encouraged patient to follow-up with nephrology, as per PCP recommendation.   PLAN SUMMARY & DISPOSITION: - Labs today - Phone visit to discuss results and next steps in 2 weeks  All questions were answered. The patient knows to call the clinic with any problems, questions or concerns.  Medical decision making: Low  Time spent on visit: I spent 15 minutes counseling the patient face to face. The total time spent in the appointment was 25 minutes and more than 50% was on counseling.   Harriett Rush, PA-C  08/30/2020 2:07 PM

## 2020-08-30 ENCOUNTER — Inpatient Hospital Stay (HOSPITAL_COMMUNITY): Payer: Medicare Other | Attending: Physician Assistant | Admitting: Physician Assistant

## 2020-08-30 ENCOUNTER — Inpatient Hospital Stay (HOSPITAL_COMMUNITY): Payer: Medicare Other

## 2020-08-30 ENCOUNTER — Other Ambulatory Visit: Payer: Self-pay

## 2020-08-30 VITALS — BP 110/59 | HR 91 | Temp 98.2°F | Resp 16 | Wt 127.0 lb

## 2020-08-30 DIAGNOSIS — N184 Chronic kidney disease, stage 4 (severe): Secondary | ICD-10-CM | POA: Diagnosis not present

## 2020-08-30 DIAGNOSIS — D539 Nutritional anemia, unspecified: Secondary | ICD-10-CM | POA: Insufficient documentation

## 2020-08-30 DIAGNOSIS — Z79899 Other long term (current) drug therapy: Secondary | ICD-10-CM | POA: Diagnosis not present

## 2020-08-30 DIAGNOSIS — D631 Anemia in chronic kidney disease: Secondary | ICD-10-CM | POA: Diagnosis not present

## 2020-08-30 DIAGNOSIS — E538 Deficiency of other specified B group vitamins: Secondary | ICD-10-CM | POA: Insufficient documentation

## 2020-08-30 LAB — CBC WITH DIFFERENTIAL/PLATELET
Abs Immature Granulocytes: 0 10*3/uL (ref 0.00–0.07)
Basophils Absolute: 0 10*3/uL (ref 0.0–0.1)
Basophils Relative: 1 %
Eosinophils Absolute: 0.2 10*3/uL (ref 0.0–0.5)
Eosinophils Relative: 5 %
HCT: 35.4 % — ABNORMAL LOW (ref 36.0–46.0)
Hemoglobin: 11.3 g/dL — ABNORMAL LOW (ref 12.0–15.0)
Immature Granulocytes: 0 %
Lymphocytes Relative: 32 %
Lymphs Abs: 1.3 10*3/uL (ref 0.7–4.0)
MCH: 33.4 pg (ref 26.0–34.0)
MCHC: 31.9 g/dL (ref 30.0–36.0)
MCV: 104.7 fL — ABNORMAL HIGH (ref 80.0–100.0)
Monocytes Absolute: 0.3 10*3/uL (ref 0.1–1.0)
Monocytes Relative: 8 %
Neutro Abs: 2.2 10*3/uL (ref 1.7–7.7)
Neutrophils Relative %: 54 %
Platelets: 190 10*3/uL (ref 150–400)
RBC: 3.38 MIL/uL — ABNORMAL LOW (ref 3.87–5.11)
RDW: 13.2 % (ref 11.5–15.5)
WBC: 4 10*3/uL (ref 4.0–10.5)
nRBC: 0 % (ref 0.0–0.2)

## 2020-08-30 LAB — IRON AND TIBC
Iron: 81 ug/dL (ref 28–170)
Saturation Ratios: 22 % (ref 10.4–31.8)
TIBC: 376 ug/dL (ref 250–450)
UIBC: 295 ug/dL

## 2020-08-30 LAB — LACTATE DEHYDROGENASE: LDH: 152 U/L (ref 98–192)

## 2020-08-30 LAB — RETICULOCYTES
Immature Retic Fract: 15.2 % (ref 2.3–15.9)
RBC.: 3.37 MIL/uL — ABNORMAL LOW (ref 3.87–5.11)
Retic Count, Absolute: 79.5 10*3/uL (ref 19.0–186.0)
Retic Ct Pct: 2.4 % (ref 0.4–3.1)

## 2020-08-30 LAB — VITAMIN B12: Vitamin B-12: 1114 pg/mL — ABNORMAL HIGH (ref 180–914)

## 2020-08-30 LAB — TSH: TSH: 0.608 u[IU]/mL (ref 0.350–4.500)

## 2020-08-30 LAB — FERRITIN: Ferritin: 26 ng/mL (ref 11–307)

## 2020-08-30 NOTE — Patient Instructions (Signed)
[facility] Discharge Instructions  You were seen today by Tarri Abernethy PA-C for your anemia.  As we discussed, your low blood levels are most likely related to your chronic kidney disease.  However, we will check some other labs today to investigate other potential causes of anemia.  LABS: Labs today before leaving the hospital (on the first floor)  OTHER TESTS: None at this time  MEDICATIONS: No changes to home medications  FOLLOW-UP APPOINTMENT: Phone visit in 2 to 4 weeks to discuss lab results   Thank you for choosing [facility] to provide your oncology and hematology care.  To afford each patient quality time with our provider, please arrive at least 15 minutes before your scheduled appointment time.   If you have a lab appointment with the [facility] please come in thru the Main Entrance and check in at the main information desk.  You need to re-schedule your appointment should you arrive 10 or more minutes late.  We strive to give you quality time with our providers, and arriving late affects you and other patients whose appointments are after yours.  Also, if you no show three or more times for appointments you may be dismissed from the clinic at the providers discretion.     Again, thank you for choosing [facility].  Our hope is that these requests will decrease the amount of time that you wait before being seen by our physicians.       _____________________________________________________________  Should you have questions after your visit to [facility], please contact our office at [phone] and follow the prompts.  Our office hours are 8:00 a.m. and 4:30 p.m. Monday - Friday.  Please note that voicemails left after 4:00 p.m. may not be returned until the following business day.  We are closed weekends and major holidays.  You do have access to a nurse 24-7, just  call the main number to the clinic [phone] and do not press any options, hold on the line and a nurse will answer the phone.    For prescription refill requests, have your pharmacy contact our office and allow 72 hours.    Due to Covid, you will need to wear a mask upon entering the hospital. If you do not have a mask, a mask will be given to you at the Main Entrance upon arrival. For doctor visits, patients may have 1 support person age 78 or older with them. For treatment visits, patients can not have anyone with them due to social distancing guidelines and our immunocompromised population.

## 2020-08-31 LAB — COPPER, SERUM: Copper: 107 ug/dL (ref 80–158)

## 2020-08-31 LAB — KAPPA/LAMBDA LIGHT CHAINS
Kappa free light chain: 69.6 mg/L — ABNORMAL HIGH (ref 3.3–19.4)
Kappa, lambda light chain ratio: 2.15 — ABNORMAL HIGH (ref 0.26–1.65)
Lambda free light chains: 32.3 mg/L — ABNORMAL HIGH (ref 5.7–26.3)

## 2020-09-01 LAB — IMMUNOFIXATION ELECTROPHORESIS
IgA: 198 mg/dL (ref 87–352)
IgG (Immunoglobin G), Serum: 815 mg/dL (ref 586–1602)
IgM (Immunoglobulin M), Srm: 47 mg/dL (ref 26–217)
Total Protein ELP: 6.6 g/dL (ref 6.0–8.5)

## 2020-09-01 LAB — METHYLMALONIC ACID, SERUM: Methylmalonic Acid, Quantitative: 278 nmol/L (ref 0–378)

## 2020-09-01 LAB — PROTEIN ELECTROPHORESIS, SERUM
A/G Ratio: 1.4 (ref 0.7–1.7)
Albumin ELP: 4 g/dL (ref 2.9–4.4)
Alpha-1-Globulin: 0.2 g/dL (ref 0.0–0.4)
Alpha-2-Globulin: 0.8 g/dL (ref 0.4–1.0)
Beta Globulin: 0.9 g/dL (ref 0.7–1.3)
Gamma Globulin: 0.8 g/dL (ref 0.4–1.8)
Globulin, Total: 2.8 g/dL (ref 2.2–3.9)
Total Protein ELP: 6.8 g/dL (ref 6.0–8.5)

## 2020-09-08 NOTE — Progress Notes (Signed)
Virtual Visit via Telephone Note [facility]  I connected with Kaitlin [name] on 09/09/20  at 8:45 AM by telephone and verified that I am speaking with the correct person using two identifiers.  Location: Patient: Home Provider: [facility]   I discussed the limitations, risks, security and privacy concerns of performing an evaluation and management service by telephone and the availability of in person appointments. I also discussed with the patient that there may be a patient responsible charge related to this service. The patient expressed understanding and agreed to proceed.   HISTORY OF PRESENT ILLNESS: Kaitlin [name] is followed by the hematology clinic for normocytic anemia.  She was last seen by Tarri Abernethy PA-C on 08/30/2020.  Since her last visit, patient reports that she has been feeling fairly well.  She denies any hospitalizations, new diagnoses, or changes in her baseline health status.  She continues to deny any signs or symptoms of blood loss such as epistaxis, hematemesis, hematochezia, or melena.  She continues to have chronic fatigue, reports that her energy today is about 70%.  She does have some shortness of breath on exertion, but denies any chest pain, palpitations, syncope.  No B symptoms such as fever, chills, night sweats, unintentional weight loss.  She reports that she has about 70% energy today, with 60% appetite.  She denies any unintentional weight loss at this time.    OBSERVATIONS/OBJECTIVE: Review of Systems  Constitutional:  Positive for malaise/fatigue (mild chronic fatigue). Negative for chills, diaphoresis, fever and weight loss.  Respiratory:  Positive for shortness of breath (on exertion). Negative for cough.   Cardiovascular:  Negative for chest pain and palpitations.  Gastrointestinal:  Negative for abdominal pain, blood in stool, melena, nausea and vomiting.  Neurological:  Negative for dizziness and headaches.     PHYSICAL EXAM (per limitations of virtual telephone visit): The patient is alert and oriented x 3, exhibiting adequate mentation, good mood, and ability to speak in full sentences and execute sound judgement.   ASSESSMENT & PLAN: 1.  Macrocytic anemia  - Previously seen by hematology for normocytic anemia secondary to CKD, iron deficiency, and B12 deficiency - She was lost to follow-up after August 2020, re-established care in July 2022 - EGD (01/28/2017): Gastritis, normal duodenum - Colonoscopy (03/01/2017): Redundant colon, large rectal polyp removed, external hemorrhoids - Symptomatic with fatigue.  Denies any signs or symptoms of blood loss.   - She takes vitamin D3, vitamin B12, and ferrous sulfate supplements at home - MGUS panel (08/30/2020): SPEP and immunofixation normal, elevated light chains with kappa 66.9/lambda 32.3/ratio 2.15 - No clear explanation of macrocytosis: No known liver disease; folate, TSH, LDH, B12, methylmalonic acid, copper normal.  No reticulocytosis. - Iron panel with borderline iron deficiency (08/30/2020): Iron saturation 22%, ferritin 26 - Most recent CBC (08/30/2020): Hgb 11.3, MCV 104.7 - Differential diagnosis favors anemia related to CKD (most recent CMP on 08/16/2020 with creatinine 2.25 and EGFR 22); cause of macrocytosis unclear, will continue to monitor - Consider initiation of ESA if hemoglobin < 10.0; will consider bone marrow biopsy if patient develops worsening anemia that is refractory to ESA - PLAN:  - Elevated light chains likely secondary to CKD, but we will repeat MGUS panel in a year (July 2023) - Continue B12 and ferrous sulfate supplements at home - Discussed possible IV iron due to persistent iron deficiency despite oral iron supplementation, but patient would like to try increasing to ferrous sulfate twice daily.  She is advised to use stool softener if she experiences constipation. - We will check stool cards x3 at next in-person visit due to  persistent iron deficiency. - Repeat CBC and iron panel with phone visit in 3 months.   2.  CKD stage III/IV - Worsening creatinine noted by PCP on most recent labs (creatinine 2.25, EGFR 22) - Patient has been referred to nephrology by her primary care provider - PLAN: Encouraged patient to follow-up with nephrology, as per PCP recommendation.   3.  Tobacco use - Current tobacco user - PLAN: Referral for LDCT lung screening program     FOLLOW UP INSTRUCTIONS: - Increase ferrous sulfate to 325 mg twice daily - RTC in 3 months for repeat labs and office visit.  (We will check stool cards at that time) - Referral to LDCT lung screening program    I discussed the assessment and treatment plan with the patient. The patient was provided an opportunity to ask questions and all were answered. The patient agreed with the plan and demonstrated an understanding of the instructions.   The patient was advised to call back or seek an in-person evaluation if the symptoms worsen or if the condition fails to improve as anticipated.  I provided 13 minutes of non-face-to-face time during this encounter.   Harriett Rush, PA-C 09/09/20 9:07 AM

## 2020-09-09 ENCOUNTER — Other Ambulatory Visit: Payer: Self-pay

## 2020-09-09 ENCOUNTER — Inpatient Hospital Stay (HOSPITAL_BASED_OUTPATIENT_CLINIC_OR_DEPARTMENT_OTHER): Payer: Medicare Other | Admitting: Physician Assistant

## 2020-09-09 DIAGNOSIS — D631 Anemia in chronic kidney disease: Secondary | ICD-10-CM | POA: Diagnosis not present

## 2020-09-09 DIAGNOSIS — D539 Nutritional anemia, unspecified: Secondary | ICD-10-CM | POA: Diagnosis not present

## 2020-09-09 DIAGNOSIS — N189 Chronic kidney disease, unspecified: Secondary | ICD-10-CM | POA: Diagnosis not present

## 2020-10-17 ENCOUNTER — Ambulatory Visit (INDEPENDENT_AMBULATORY_CARE_PROVIDER_SITE_OTHER): Payer: Medicare Other | Admitting: Internal Medicine

## 2020-10-17 ENCOUNTER — Encounter: Payer: Self-pay | Admitting: Internal Medicine

## 2020-10-17 ENCOUNTER — Other Ambulatory Visit: Payer: Self-pay

## 2020-10-17 VITALS — BP 115/68 | HR 99 | Temp 97.8°F | Resp 18 | Ht 61.0 in | Wt 129.0 lb

## 2020-10-17 DIAGNOSIS — I25810 Atherosclerosis of coronary artery bypass graft(s) without angina pectoris: Secondary | ICD-10-CM | POA: Diagnosis not present

## 2020-10-17 DIAGNOSIS — F17210 Nicotine dependence, cigarettes, uncomplicated: Secondary | ICD-10-CM | POA: Diagnosis not present

## 2020-10-17 DIAGNOSIS — Z720 Tobacco use: Secondary | ICD-10-CM

## 2020-10-17 DIAGNOSIS — I6523 Occlusion and stenosis of bilateral carotid arteries: Secondary | ICD-10-CM

## 2020-10-17 DIAGNOSIS — G609 Hereditary and idiopathic neuropathy, unspecified: Secondary | ICD-10-CM

## 2020-10-17 DIAGNOSIS — J432 Centrilobular emphysema: Secondary | ICD-10-CM | POA: Diagnosis not present

## 2020-10-17 DIAGNOSIS — E782 Mixed hyperlipidemia: Secondary | ICD-10-CM | POA: Diagnosis not present

## 2020-10-17 DIAGNOSIS — I739 Peripheral vascular disease, unspecified: Secondary | ICD-10-CM | POA: Diagnosis not present

## 2020-10-17 DIAGNOSIS — N1832 Chronic kidney disease, stage 3b: Secondary | ICD-10-CM

## 2020-10-17 DIAGNOSIS — I714 Abdominal aortic aneurysm, without rupture, unspecified: Secondary | ICD-10-CM

## 2020-10-17 DIAGNOSIS — Z23 Encounter for immunization: Secondary | ICD-10-CM

## 2020-10-17 DIAGNOSIS — Z7689 Persons encountering health services in other specified circumstances: Secondary | ICD-10-CM

## 2020-10-17 MED ORDER — ALBUTEROL SULFATE HFA 108 (90 BASE) MCG/ACT IN AERS: 2.0000 | INHALATION_SPRAY | Freq: Four times a day (QID) | RESPIRATORY_TRACT | 2 refills | Status: DC | PRN

## 2020-10-17 NOTE — Assessment & Plan Note (Signed)
Last BMP reviewed, showed AKI on CKD Check BMP Avoid nephrotoxic agents including NSAIDs Will refer to nephrology if persistent or worse kidney function test

## 2020-10-17 NOTE — Assessment & Plan Note (Signed)
No current leg claudication symptoms Followed by Cardiology On Aspirin, Plavix and statin 

## 2020-10-17 NOTE — Assessment & Plan Note (Signed)
S/p right carotid and arterectomy On aspirin, Plavix and statin.

## 2020-10-17 NOTE — Progress Notes (Signed)
New Patient Office Visit  Subjective:  Patient ID: Kaitlin [name], female    DOB: 09/22/1950  Age: 70 y.o. MRN: [id number]  CC:  Chief Complaint  Patient presents with   New Patient (Initial Visit)    New patient has had some numbness in left pinky finger going to wrist was told nerve damage in the past     HPI Kaitlin [name] is a 70 y.o. female with PMH of CAD s/p stent placement, PAD, AAA, carotid stenosis s/p right carotid and arterectomy, HLD, COPD, CKD stage IIIb and tobacco abuse who presents for establishing care.  She is a former patient of Jeralyn Ruths, NP.  She sees cardiologist for history of CAD, PAD, AAA and carotid stenosis.  She is on aspirin, Plavix and statin.  She denies any chest pain, dyspnea, LE swelling or leg claudication.  She sees hematologist for history of macrocytic anemia.  She takes vitamin B12 and iron supplements.  Denies any active signs of bleeding currently. She had colonoscopy in 2019 for suspicion of GI bleeding, but there was no active sign of bleeding. She has not had repeat colonoscopy since then.  She has history of CKD stage IIIb.  She denies any dysuria or hematuria currently.  She has history of COPD, and has chronic cough.  She is not on any inhalers currently.  She complains of intermittent numbness over the left pinky finger since she had injury over her left elbow about a year ago.  She denies any problem with making a fist.  Denies any weakness of the left hand.  She has had 2 doses of COVID-vaccine.  She she received flu vaccine in the office today.  Past Medical History:  Diagnosis Date   AAA (abdominal aortic aneurysm) (HCC)    Arthritis    Asthma    CAD (coronary artery disease)    stents   Calculus of gallbladder without cholecystitis without obstruction    COPD (chronic obstructive pulmonary disease) (HCC)    Emphysema of lung (HCC)    GERD (gastroesophageal reflux disease)    Hyperlipidemia    Hypertension    Iliac artery  stenosis, right (HCC)    60%-70%   Myocardial infarction ([place])    Normocytic anemia 01/26/2017   PAD (peripheral artery disease) ([place]) 12/14/2010   in the left vertebral, bilateral carotids    Past Surgical History:  Procedure Laterality Date   CARDIAC CATHETERIZATION  02/04/2005   A cypher 5.3 x 18 mm at 16 atmosphere of pressure in the mid LAD, this stent covered the proximal part of the LAD and also the mid LAD, this was then post dilated with 3.25 x 15 mm Quantum at 16 atmosphereof pressure for 45 seconds   CATARACT EXTRACTION Bilateral    CHOLECYSTECTOMY N/A 08/16/2017   Procedure: LAPAROSCOPIC CHOLECYSTECTOMY;  Surgeon: Aviva Signs, MD;  Location: [facility];  Service: General;  Laterality: N/A;   COLONOSCOPY N/A 03/01/2017   Procedure: COLONOSCOPY;  Surgeon: Danie Binder, MD;  Location: [facility];  Service: Endoscopy;  Laterality: N/A;  1:00pm   ESOPHAGOGASTRODUODENOSCOPY N/A 01/28/2017   Procedure: ESOPHAGOGASTRODUODENOSCOPY (EGD);  Surgeon: Jerene Bears, MD;  Location: [facility] ENDOSCOPY;  Service: Gastroenterology;  Laterality: N/A;   GIVENS CAPSULE STUDY N/A 03/12/2017   Procedure: GIVENS CAPSULE STUDY;  Surgeon: Danie Binder, MD;  Location: [facility];  Service: Endoscopy;  Laterality: N/A;  7:30am   TUBAL LIGATION      Family History  Problem Relation  Age of Onset   Heart attack Mother    Hyperlipidemia Mother    Hypertension Mother    Diabetes Mother    Heart attack Father    Early death Father 38   Hyperlipidemia Father    Hypertension Father    Heart disease Sister    Hyperlipidemia Sister    Hypertension Sister    Diabetes Brother    Heart disease Sister    Hyperlipidemia Sister    Hypertension Sister    Heart attack Sister    Heart disease Sister    Hyperlipidemia Sister    Hypertension Sister    Heart attack Brother    Early death Brother 72   Hyperlipidemia Brother    Hypertension Brother    Heart attack Brother    Early death Brother 47    Hyperlipidemia Brother    Hypertension Brother    Cancer Maternal Aunt    Colon cancer Neg Hx    Colon polyps Neg Hx     Social History   Socioeconomic History   Marital status: Married    Spouse name: Marcello Moores   Number of children: 3   Years of education: 80   Highest education level: Not on file  Occupational History   Occupation: RETIRED    Comment: warehouse/textiles  Tobacco Use   Smoking status: Every Day    Packs/day: 1.00    Years: 50.00    Pack years: 50.00    Types: Cigarettes    Start date: 02/09/1967   Smokeless tobacco: Never  Vaping Use   Vaping Use: Never used  Substance and Sexual Activity   Alcohol use: No   Drug use: No   Sexual activity: Yes    Birth control/protection: Post-menopausal  Other Topics Concern   Not on file  Social History Narrative   Retired   Has an [place] in [employer] with husband Marcello Moores for 23 years   Stays busy with home, gardens, likes to can   Social Determinants of Radio broadcast assistant Strain: Not on file  Food Insecurity: Not on file  Transportation Needs: Not on file  Physical Activity: Not on file  Stress: Not on file  Social Connections: Not on file  Intimate Partner Violence: Not on file    ROS Review of Systems  Constitutional:  Negative for chills and fever.  HENT:  Negative for congestion, sinus pressure, sinus pain and sore throat.   Eyes:  Negative for pain and discharge.  Respiratory:  Positive for cough. Negative for shortness of breath.   Cardiovascular:  Negative for chest pain and palpitations.  Gastrointestinal:  Negative for abdominal pain, constipation, diarrhea, nausea and vomiting.  Endocrine: Negative for polydipsia and polyuria.  Genitourinary:  Negative for dysuria and hematuria.  Musculoskeletal:  Negative for neck pain and neck stiffness.  Skin:  Negative for rash.  Neurological:  Positive for numbness. Negative for dizziness and weakness.  Psychiatric/Behavioral:  Negative for  agitation and behavioral problems.    Objective:   Today's Vitals: BP 115/68 (BP Location: Left Arm, Patient Position: Sitting, Cuff Size: Normal)   Pulse 99   Temp 97.8 F (36.6 C) (Oral)   Resp 18   Ht 5\' 1"  (1.549 m)   Wt 129 lb 0.6 oz (58.5 kg)   SpO2 96%   BMI 24.38 kg/m   Physical Exam Vitals reviewed.  Constitutional:      General: She is not in acute distress.    Appearance: She is not  diaphoretic.  HENT:     Head: Normocephalic and atraumatic.     Nose: Nose normal.     Mouth/Throat:     Mouth: Mucous membranes are moist.  Eyes:     General: No scleral icterus.    Extraocular Movements: Extraocular movements intact.  Cardiovascular:     Rate and Rhythm: Normal rate and regular rhythm.     Pulses: Normal pulses.     Heart sounds: Normal heart sounds. No murmur heard. Pulmonary:     Breath sounds: Normal breath sounds. No wheezing or rales.  Abdominal:     Palpations: Abdomen is soft.     Tenderness: There is no abdominal tenderness.  Musculoskeletal:     Cervical back: Neck supple. No tenderness.     Right lower leg: No edema.     Left lower leg: No edema.  Skin:    General: Skin is warm.     Findings: No rash.  Neurological:     General: No focal deficit present.     Mental Status: She is alert and oriented to person, place, and time.     Sensory: No sensory deficit.     Motor: No weakness.  Psychiatric:        Mood and Affect: Mood normal.        Behavior: Behavior normal.    Assessment & Plan:   Problem List Items Addressed This Visit       Encounter to establish care    -  Primary Care established Previous chart reviewed History and medications reviewed with the patient  Cardiovascular and Mediastinum   CAD (coronary artery disease) of artery bypass graft    S/p stent placement Followed by Cardiology On Aspirin, Plavix and statin      PAD (peripheral artery disease) (HCC)    No current leg claudication symptoms Followed by  Cardiology On Aspirin, Plavix and statin      Carotid stenosis    S/p right carotid and arterectomy On aspirin, Plavix and statin.      AAA (abdominal aortic aneurysm) (HCC)    Stable in size, last US abdomen reviewed Next US abdomen for AAA follow-up in 3 years.        Respiratory   Emphysema lung (HCC)    Last CT chest low-dose reviewed Has chronic cough, started albuterol as needed      Relevant Medications   albuterol (VENTOLIN HFA) 108 (90 Base) MCG/ACT inhaler     Nervous and Auditory   Idiopathic peripheral neuropathy    Isolated over left ulnar nerve distribution in hand region Not affecting handgrip, monitor for now.        Genitourinary   Stage 3b chronic kidney disease ([place])    Last BMP reviewed, showed AKI on CKD Check BMP Avoid nephrotoxic agents including NSAIDs Will refer to nephrology if persistent or worse kidney function test      Relevant Orders   Basic Metabolic Panel (BMET)     Other   Hyperlipidemia    On statin      Tobacco abuse    Smokes about a pack per day  Asked about quitting: confirms that she currently smokes cigarettes Advise to quit smoking: Educated about QUITTING to reduce the risk of cancer, cardio and cerebrovascular disease. Assess willingness: Unwilling to quit at this time, but is working on cutting back. Assist with counseling and pharmacotherapy: Counseled for 5 minutes and literature provided. Arrange for follow up: Follow up in 3 months and continue  to offer help.      Other Visit Diagnoses        Need for immunization against influenza       Relevant Orders   Flu Vaccine QUAD High Dose(Fluad) (Completed)       Outpatient Encounter Medications as of 10/17/2020  Medication Sig   albuterol (VENTOLIN HFA) 108 (90 Base) MCG/ACT inhaler Inhale 2 puffs into the lungs every 6 (six) hours as needed for wheezing or shortness of breath.   aspirin 81 MG chewable tablet Chew 81 mg by mouth daily.   cholecalciferol  (VITAMIN D) 1000 units tablet Take 2 Units by mouth daily.    clopidogrel (PLAVIX) 75 MG tablet TAKE ONE (1) TABLET BY MOUTH EVERY DAY   ferrous sulfate 325 (65 FE) MG tablet Take 325 mg by mouth daily with breakfast.   Probiotic Product (PROBIOTIC-10 PO) Take 10 mg by mouth daily.   rosuvastatin (CRESTOR) 40 MG tablet Take 1 tablet (40 mg total) by mouth daily.   vitamin B-12 (CYANOCOBALAMIN) 500 MCG tablet Take 500 mcg by mouth daily.   Vitamin E 670 MG (1000 UT) CAPS Take by mouth.   NITROSTAT 0.4 MG SL tablet PLACE 1 TABLET UNDER TONGUE EVERY 5 MINUTES FOR 3 DOSES AS NEEDED. (Patient not taking: No sig reported)   No facility-administered encounter medications on file as of 10/17/2020.    Follow-up: Return in about 4 months (around 02/16/2021) for COPD, CKD and CAD.   Lindell Spar, MD

## 2020-10-17 NOTE — Assessment & Plan Note (Signed)
Stable in size, last US abdomen reviewed Next US abdomen for AAA follow-up in 3 years.

## 2020-10-17 NOTE — Assessment & Plan Note (Signed)
Smokes about a pack per day  Asked about quitting: confirms that she currently smokes cigarettes Advise to quit smoking: Educated about QUITTING to reduce the risk of cancer, cardio and cerebrovascular disease. Assess willingness: Unwilling to quit at this time, but is working on cutting back. Assist with counseling and pharmacotherapy: Counseled for 5 minutes and literature provided. Arrange for follow up: Follow up in 3 months and continue to offer help. 

## 2020-10-17 NOTE — Assessment & Plan Note (Signed)
S/p stent placement Followed by Cardiology On Aspirin, Plavix and statin 

## 2020-10-17 NOTE — Assessment & Plan Note (Addendum)
Isolated over left ulnar nerve distribution in hand region Not affecting handgrip, monitor for now.

## 2020-10-17 NOTE — Assessment & Plan Note (Signed)
On statin.

## 2020-10-17 NOTE — Assessment & Plan Note (Signed)
Last CT chest low-dose reviewed Has chronic cough, started albuterol as needed

## 2020-10-17 NOTE — Patient Instructions (Signed)
Please start using Albuterol inhaler as needed for shortness of breath or wheezing.  Please continue to take medications as prescribed.  Please continue to follow low-salt diet and ambulate as tolerated.  Please contact your GI Physician for scheduling colonoscopy.

## 2020-10-18 ENCOUNTER — Other Ambulatory Visit: Payer: Self-pay | Admitting: Internal Medicine

## 2020-10-18 DIAGNOSIS — N1832 Chronic kidney disease, stage 3b: Secondary | ICD-10-CM

## 2020-10-18 LAB — BASIC METABOLIC PANEL
BUN/Creatinine Ratio: 8 — ABNORMAL LOW (ref 12–28)
BUN: 13 mg/dL (ref 8–27)
CO2: 27 mmol/L (ref 20–29)
Calcium: 9.1 mg/dL (ref 8.7–10.3)
Chloride: 107 mmol/L — ABNORMAL HIGH (ref 96–106)
Creatinine, Ser: 1.65 mg/dL — ABNORMAL HIGH (ref 0.57–1.00)
Glucose: 82 mg/dL (ref 65–99)
Potassium: 3.9 mmol/L (ref 3.5–5.2)
Sodium: 148 mmol/L — ABNORMAL HIGH (ref 134–144)
eGFR: 34 mL/min/{1.73_m2} — ABNORMAL LOW (ref >59)

## 2020-10-27 ENCOUNTER — Ambulatory Visit (INDEPENDENT_AMBULATORY_CARE_PROVIDER_SITE_OTHER): Payer: Medicare Other | Admitting: Internal Medicine

## 2020-12-02 ENCOUNTER — Other Ambulatory Visit: Payer: Self-pay | Admitting: Cardiology

## 2020-12-08 NOTE — Progress Notes (Signed)
[facility] [place], [place] [address]   CLINIC:  Medical Oncology/Hematology  PCP:  Lindell Spar, MD [address] [place] [address] [phone]   REASON FOR VISIT:  Follow-up for macrocytic anemia  CURRENT THERAPY: Oral iron supplementation  INTERVAL HISTORY:  Kaitlin [name] 70 y.o. female returns for routine follow-up of her macrocytic anemia, which is thought to be due to CKD stage III/IV.  She was last evaluated via telemedicine visit by Tarri Abernethy PA-C on 09/09/2020.  At today's visit, she reports feeling well.  No recent hospitalizations, surgeries, or changes in baseline health status.  She has been taking ferrous sulfate once daily. She denies any major bleeding events such as hematemesis, hematochezia, melena, or epistaxis. She denies any symptoms of fatigue, pica, restless legs, chest pain, dyspnea on exertion, lightheadedness, or syncope.  She has 80% energy and 100% appetite. She endorses that she is maintaining a stable weight.    REVIEW OF SYSTEMS:  Review of Systems  Constitutional:  Negative for appetite change, chills, diaphoresis, fatigue, fever and unexpected weight change.  HENT:   Negative for lump/mass and nosebleeds.   Eyes:  Negative for eye problems.  Respiratory:  Positive for cough. Negative for hemoptysis and shortness of breath.   Cardiovascular:  Negative for chest pain, leg swelling and palpitations.  Gastrointestinal:  Negative for abdominal pain, blood in stool, constipation, diarrhea, nausea and vomiting.  Genitourinary:  Negative for hematuria.   Skin: Negative.   Neurological:  Negative for dizziness, headaches and light-headedness.  Hematological:  Does not bruise/bleed easily.     PAST MEDICAL/SURGICAL HISTORY:  Past Medical History:  Diagnosis Date   AAA (abdominal aortic aneurysm) (HCC)    Arthritis    Asthma    CAD (coronary artery disease)    stents   Calculus of gallbladder  without cholecystitis without obstruction    COPD (chronic obstructive pulmonary disease) (HCC)    Emphysema of lung (HCC)    GERD (gastroesophageal reflux disease)    Hyperlipidemia    Hypertension    Iliac artery stenosis, right (HCC)    60%-70%   Myocardial infarction ([place])    Normocytic anemia 01/26/2017   PAD (peripheral artery disease) ([place]) 12/14/2010   in the left vertebral, bilateral carotids   Past Surgical History:  Procedure Laterality Date   CARDIAC CATHETERIZATION  02/04/2005   A cypher 5.3 x 18 mm at 16 atmosphere of pressure in the mid LAD, this stent covered the proximal part of the LAD and also the mid LAD, this was then post dilated with 3.25 x 15 mm Quantum at 16 atmosphereof pressure for 45 seconds   CATARACT EXTRACTION Bilateral    CHOLECYSTECTOMY N/A 08/16/2017   Procedure: LAPAROSCOPIC CHOLECYSTECTOMY;  Surgeon: Aviva Signs, MD;  Location: [facility];  Service: General;  Laterality: N/A;   COLONOSCOPY N/A 03/01/2017   Procedure: COLONOSCOPY;  Surgeon: Danie Binder, MD;  Location: [facility];  Service: Endoscopy;  Laterality: N/A;  1:00pm   ESOPHAGOGASTRODUODENOSCOPY N/A 01/28/2017   Procedure: ESOPHAGOGASTRODUODENOSCOPY (EGD);  Surgeon: Jerene Bears, MD;  Location: [facility] ENDOSCOPY;  Service: Gastroenterology;  Laterality: N/A;   GIVENS CAPSULE STUDY N/A 03/12/2017   Procedure: GIVENS CAPSULE STUDY;  Surgeon: Danie Binder, MD;  Location: [facility];  Service: Endoscopy;  Laterality: N/A;  7:30am   TUBAL LIGATION       SOCIAL HISTORY:  Social History   Socioeconomic History   Marital status: Married  Spouse name: Kaitlin [name]   Number of children: 3   Years of education: 14   Highest education level: Not on file  Occupational History   Occupation: RETIRED    Comment: warehouse/textiles  Tobacco Use   Smoking status: Every Day    Packs/day: 1.00    Years: 50.00    Pack years: 50.00    Types: Cigarettes    Start date: 02/09/1967   Smokeless tobacco:  Never  Vaping Use   Vaping Use: Never used  Substance and Sexual Activity   Alcohol use: No   Drug use: No   Sexual activity: Yes    Birth control/protection: Post-menopausal  Other Topics Concern   Not on file  Social History Narrative   Retired   Has an [place] in [employer] with husband Kaitlin [name] for 75 years   Stays busy with home, gardens, likes to can   Social Determinants of Radio broadcast assistant Strain: Not on file  Food Insecurity: Not on file  Transportation Needs: Not on file  Physical Activity: Not on file  Stress: Not on file  Social Connections: Not on file  Intimate Partner Violence: Not on file    FAMILY HISTORY:  Family History  Problem Relation Age of Onset   Heart attack Mother    Hyperlipidemia Mother    Hypertension Mother    Diabetes Mother    Heart attack Father    Early death Father 49   Hyperlipidemia Father    Hypertension Father    Heart disease Sister    Hyperlipidemia Sister    Hypertension Sister    Diabetes Brother    Heart disease Sister    Hyperlipidemia Sister    Hypertension Sister    Heart attack Sister    Heart disease Sister    Hyperlipidemia Sister    Hypertension Sister    Heart attack Brother    Early death Brother 33   Hyperlipidemia Brother    Hypertension Brother    Heart attack Brother    Early death Brother 48   Hyperlipidemia Brother    Hypertension Brother    Cancer Maternal Aunt    Colon cancer Neg Hx    Colon polyps Neg Hx     CURRENT MEDICATIONS:  Outpatient Encounter Medications as of 12/09/2020  Medication Sig   albuterol (VENTOLIN HFA) 108 (90 Base) MCG/ACT inhaler Inhale 2 puffs into the lungs every 6 (six) hours as needed for wheezing or shortness of breath.   aspirin 81 MG chewable tablet Chew 81 mg by mouth daily.   cholecalciferol (VITAMIN D) 1000 units tablet Take 2 Units by mouth daily.    clopidogrel (PLAVIX) 75 MG tablet TAKE ONE (1) TABLET BY MOUTH EVERY DAY   ferrous sulfate 325 (65  FE) MG tablet Take 325 mg by mouth daily with breakfast.   NITROSTAT 0.4 MG SL tablet PLACE 1 TABLET UNDER TONGUE EVERY 5 MINUTES FOR 3 DOSES AS NEEDED. (Patient not taking: No sig reported)   Probiotic Product (PROBIOTIC-10 PO) Take 10 mg by mouth daily.   rosuvastatin (CRESTOR) 40 MG tablet Take 1 tablet (40 mg total) by mouth daily.   vitamin B-12 (CYANOCOBALAMIN) 500 MCG tablet Take 500 mcg by mouth daily.   Vitamin E 670 MG (1000 UT) CAPS Take by mouth.   No facility-administered encounter medications on file as of 12/09/2020.    ALLERGIES:  No Known Allergies   PHYSICAL EXAM:  ECOG PERFORMANCE STATUS: 0 - Asymptomatic  There  were no vitals filed for this visit. There were no vitals filed for this visit. Physical Exam Constitutional:      Appearance: Normal appearance.  HENT:     Head: Normocephalic and atraumatic.     Mouth/Throat:     Mouth: Mucous membranes are moist.  Eyes:     Extraocular Movements: Extraocular movements intact.     Pupils: Pupils are equal, round, and reactive to light.  Cardiovascular:     Rate and Rhythm: Normal rate and regular rhythm.     Pulses: Normal pulses.     Heart sounds: Normal heart sounds.  Pulmonary:     Effort: Pulmonary effort is normal.     Breath sounds: Normal breath sounds.  Abdominal:     General: Bowel sounds are normal.     Palpations: Abdomen is soft.     Tenderness: There is no abdominal tenderness.  Musculoskeletal:        General: No swelling.     Right lower leg: No edema.     Left lower leg: No edema.  Lymphadenopathy:     Cervical: No cervical adenopathy.  Skin:    General: Skin is warm and dry.  Neurological:     General: No focal deficit present.     Mental Status: She is alert and oriented to person, place, and time.  Psychiatric:        Mood and Affect: Mood normal.        Behavior: Behavior normal.     LABORATORY DATA:  I have reviewed the labs as listed.  CBC    Component Value Date/Time    WBC 4.0 08/30/2020 1417   RBC 3.38 (L) 08/30/2020 1417   RBC 3.37 (L) 08/30/2020 1417   HGB 11.3 (L) 08/30/2020 1417   HCT 35.4 (L) 08/30/2020 1417   PLT 190 08/30/2020 1417   MCV 104.7 (H) 08/30/2020 1417   MCH 33.4 08/30/2020 1417   MCHC 31.9 08/30/2020 1417   RDW 13.2 08/30/2020 1417   LYMPHSABS 1.3 08/30/2020 1417   MONOABS 0.3 08/30/2020 1417   EOSABS 0.2 08/30/2020 1417   BASOSABS 0.0 08/30/2020 1417   CMP Latest Ref Rng & Units 10/17/2020 07/20/2020 08/13/2019  Glucose 65 - 99 mg/dL 82 93 120(H)  BUN 8 - 27 mg/dL 13 26(H) 16  Creatinine 0.57 - 1.00 mg/dL 1.65(H) 2.25(H) 1.70(H)  Sodium 134 - 144 mmol/L 148(H) 142 143  Potassium 3.5 - 5.2 mmol/L 3.9 4.0 3.6  Chloride 96 - 106 mmol/L 107(H) 106 106  CO2 20 - 29 mmol/L 27 26 30   Calcium 8.7 - 10.3 mg/dL 9.1 9.2 9.0  Total Protein 6.1 - 8.1 g/dL - - 6.1  Total Bilirubin 0.2 - 1.2 mg/dL - - 0.3  Alkaline Phos 38 - 126 U/L - - -  AST 10 - 35 U/L - - 10  ALT 6 - 29 U/L - - 8    DIAGNOSTIC IMAGING:  I have independently reviewed the relevant imaging and discussed with the patient.    ASSESSMENT & PLAN: 1.  Macrocytic anemia  - Previously seen by hematology for normocytic anemia secondary to CKD, iron deficiency, and B12 deficiency.  Was lost to follow-up after August 2020, re-established care in July 2022 - EGD (01/28/2017): Gastritis, normal duodenum - Colonoscopy (03/01/2017): Redundant colon, large rectal polyp removed, external hemorrhoids -Stool cards have not been checked at our office, patient reports that one of her other providers (Dr. Oneida Alar) checked them and they were negative. - She  denies any signs or symptoms of blood loss. - She takes ferrous sulfate daily at home.  She takes vitamin B12 at home. - MGUS panel (08/30/2020): SPEP and immunofixation normal, elevated light chains with kappa 66.9/lambda 32.3/ratio 2.15 - No clear explanation of macrocytosis: No known liver disease (liver US in 2018 was normal); folate,  TSH, LDH, B12, methylmalonic acid, copper normal.  No reticulocytosis. - Iron panel today shows improved iron levels with ferritin 41 and TIBC 16% -CBC today (12/09/2020): Normal Hgb 13.1/MCV 98.8 - Differential diagnosis favors anemia related to CKD stage IIIb/IV; cause of macrocytosis unclear, will continue to monitor - PLAN: CBC and iron panel are currently within normal limits.  Macrocytosis have resolved. - No indication for IV iron at this time.  Continue ferrous sulfate daily. - Elevated light chains likely secondary to CKD, but we will repeat MGUS panel in 1 year (July 2023) - Repeat labs and RTC in 6 months  2.  CKD stage III/IV - Patient has been referred to nephrology by her primary care provider  - CMP today (12/09/2020): Creatinine 1.48/GFR 38 - PLAN: Patient has an appointment to establish care with nephrologist next week.  2.  Tobacco abuse - This patient meets criteria for low-dose CT lung cancer screening.  She has smoked 1 to 2 packs/day since age 4, currently smoking 1 pack/day but trying to quit. - The shared decision making visit discussion included risks and benefits of screening, potential for follow-up, diagnostic testing for abnormal scans, potential for false positive tests, overdiagnosis, discussion about total radiation exposure - Patient stated willingness to undergo diagnostics and treatment as needed - Patient was counseled on smoking cessation to decrease the  risk of lung cancer, pulmonary disease, heart disease, and stroke - Patient has been referred to Lung Cancer Screening Nurse Coordinator for further scheduling of LDCT and for further resources regarding free nicotine replacement therapy and information about smoking cessation classes  - Most recent CT chest lung cancer screening as ordered by PCP last year (05/21/2019): Lung RADS 2, benign - PLAN: We will schedule patient for repeat LDCT chest for lung cancer screening   PLAN SUMMARY & DISPOSITION: LDCT  chest Labs and RTC in 6 months  All questions were answered. The patient knows to call the clinic with any problems, questions or concerns.  Medical decision making: Moderate  Time spent on visit: I spent 20 minutes counseling the patient face to face. The total time spent in the appointment was 30 minutes and more than 50% was on counseling.   Harriett Rush, PA-C  12/09/2020 10:47 AM

## 2020-12-09 ENCOUNTER — Inpatient Hospital Stay (HOSPITAL_BASED_OUTPATIENT_CLINIC_OR_DEPARTMENT_OTHER): Payer: Medicare Other | Admitting: Physician Assistant

## 2020-12-09 ENCOUNTER — Ambulatory Visit (HOSPITAL_COMMUNITY): Payer: Medicare Other | Admitting: Physician Assistant

## 2020-12-09 ENCOUNTER — Other Ambulatory Visit: Payer: Self-pay

## 2020-12-09 ENCOUNTER — Inpatient Hospital Stay (HOSPITAL_COMMUNITY): Payer: Medicare Other | Attending: Physician Assistant

## 2020-12-09 ENCOUNTER — Other Ambulatory Visit (HOSPITAL_COMMUNITY): Payer: Medicare Other

## 2020-12-09 VITALS — BP 112/71 | HR 92 | Temp 97.7°F | Resp 18 | Wt 127.3 lb

## 2020-12-09 DIAGNOSIS — E611 Iron deficiency: Secondary | ICD-10-CM

## 2020-12-09 DIAGNOSIS — Z87891 Personal history of nicotine dependence: Secondary | ICD-10-CM

## 2020-12-09 DIAGNOSIS — R768 Other specified abnormal immunological findings in serum: Secondary | ICD-10-CM

## 2020-12-09 DIAGNOSIS — D631 Anemia in chronic kidney disease: Secondary | ICD-10-CM | POA: Insufficient documentation

## 2020-12-09 DIAGNOSIS — N183 Chronic kidney disease, stage 3 unspecified: Secondary | ICD-10-CM | POA: Insufficient documentation

## 2020-12-09 DIAGNOSIS — N189 Chronic kidney disease, unspecified: Secondary | ICD-10-CM | POA: Diagnosis not present

## 2020-12-09 DIAGNOSIS — D539 Nutritional anemia, unspecified: Secondary | ICD-10-CM

## 2020-12-09 LAB — COMPREHENSIVE METABOLIC PANEL
ALT: 15 U/L (ref 0–44)
AST: 18 U/L (ref 15–41)
Albumin: 4 g/dL (ref 3.5–5.0)
Alkaline Phosphatase: 115 U/L (ref 38–126)
Anion gap: 8 (ref 5–15)
BUN: 15 mg/dL (ref 8–23)
CO2: 28 mmol/L (ref 22–32)
Calcium: 9.1 mg/dL (ref 8.9–10.3)
Chloride: 102 mmol/L (ref 98–111)
Creatinine, Ser: 1.48 mg/dL — ABNORMAL HIGH (ref 0.44–1.00)
GFR, Estimated: 38 mL/min — ABNORMAL LOW (ref >60)
Glucose, Bld: 113 mg/dL — ABNORMAL HIGH (ref 70–99)
Potassium: 3.7 mmol/L (ref 3.5–5.1)
Sodium: 138 mmol/L (ref 135–145)
Total Bilirubin: 0.7 mg/dL (ref 0.3–1.2)
Total Protein: 7.4 g/dL (ref 6.5–8.1)

## 2020-12-09 LAB — CBC WITH DIFFERENTIAL/PLATELET
Abs Immature Granulocytes: 0.01 10*3/uL (ref 0.00–0.07)
Basophils Absolute: 0 10*3/uL (ref 0.0–0.1)
Basophils Relative: 1 %
Eosinophils Absolute: 0.2 10*3/uL (ref 0.0–0.5)
Eosinophils Relative: 4 %
HCT: 40.1 % (ref 36.0–46.0)
Hemoglobin: 13.1 g/dL (ref 12.0–15.0)
Immature Granulocytes: 0 %
Lymphocytes Relative: 24 %
Lymphs Abs: 1.2 10*3/uL (ref 0.7–4.0)
MCH: 32.3 pg (ref 26.0–34.0)
MCHC: 32.7 g/dL (ref 30.0–36.0)
MCV: 98.8 fL (ref 80.0–100.0)
Monocytes Absolute: 0.3 10*3/uL (ref 0.1–1.0)
Monocytes Relative: 6 %
Neutro Abs: 3.3 10*3/uL (ref 1.7–7.7)
Neutrophils Relative %: 65 %
Platelets: 176 10*3/uL (ref 150–400)
RBC: 4.06 MIL/uL (ref 3.87–5.11)
RDW: 12.9 % (ref 11.5–15.5)
WBC: 5 10*3/uL (ref 4.0–10.5)
nRBC: 0 % (ref 0.0–0.2)

## 2020-12-09 LAB — IRON AND TIBC
Iron: 59 ug/dL (ref 28–170)
Saturation Ratios: 16 % (ref 10.4–31.8)
TIBC: 372 ug/dL (ref 250–450)
UIBC: 313 ug/dL

## 2020-12-09 LAB — FERRITIN: Ferritin: 41 ng/mL (ref 11–307)

## 2020-12-09 NOTE — Patient Instructions (Signed)
[facility] [place] at [facility] Discharge Instructions  You were seen today by Tarri Abernethy PA-C for your history of anemia.  Your blood and iron counts look great at today's visit!  Continue to take your iron tablet once daily at home.  Continue to follow-up with the kidney doctor as recommended.  We will also schedule you for lung cancer screening CT of your chest, which will be repeated annually due to your underlying risk of lung cancer related to your tobacco use.  LABS: Return in 6 months for repeat labs  OTHER TESTS: Low-dose CT chest  MEDICATIONS: Continue iron tablets once daily  FOLLOW-UP APPOINTMENT: Office visit in 6 months, after labs   Thank you for choosing [place] at [facility] to provide your oncology and hematology care.  To afford each patient quality time with our provider, please arrive at least 15 minutes before your scheduled appointment time.   If you have a lab appointment with the [facility] please come in thru the Main Entrance and check in at the main information desk.  You need to re-schedule your appointment should you arrive 10 or more minutes late.  We strive to give you quality time with our providers, and arriving late affects you and other patients whose appointments are after yours.  Also, if you no show three or more times for appointments you may be dismissed from the clinic at the providers discretion.     Again, thank you for choosing [facility].  Our hope is that these requests will decrease the amount of time that you wait before being seen by our physicians.       _____________________________________________________________  Should you have questions after your visit to [facility], please contact our office at [phone] and follow the prompts.  Our office hours are 8:00 a.m. and 4:30 p.m. Monday - Friday.  Please note that voicemails left after 4:00 p.m. may not be  returned until the following business day.  We are closed weekends and major holidays.  You do have access to a nurse 24-7, just call the main number to the clinic [phone] and do not press any options, hold on the line and a nurse will answer the phone.    For prescription refill requests, have your pharmacy contact our office and allow 72 hours.    Due to Covid, you will need to wear a mask upon entering the hospital. If you do not have a mask, a mask will be given to you at the Main Entrance upon arrival. For doctor visits, patients may have 1 support person age 50 or older with them. For treatment visits, patients can not have anyone with them due to social distancing guidelines and our immunocompromised population.

## 2020-12-15 ENCOUNTER — Other Ambulatory Visit (HOSPITAL_COMMUNITY): Payer: Self-pay | Admitting: Nephrology

## 2020-12-15 ENCOUNTER — Other Ambulatory Visit: Payer: Self-pay | Admitting: Nephrology

## 2020-12-15 DIAGNOSIS — Z716 Tobacco abuse counseling: Secondary | ICD-10-CM

## 2020-12-15 DIAGNOSIS — I129 Hypertensive chronic kidney disease with stage 1 through stage 4 chronic kidney disease, or unspecified chronic kidney disease: Secondary | ICD-10-CM

## 2020-12-15 DIAGNOSIS — N281 Cyst of kidney, acquired: Secondary | ICD-10-CM

## 2020-12-15 DIAGNOSIS — R809 Proteinuria, unspecified: Secondary | ICD-10-CM

## 2020-12-15 DIAGNOSIS — N1832 Chronic kidney disease, stage 3b: Secondary | ICD-10-CM

## 2020-12-19 DIAGNOSIS — N1832 Chronic kidney disease, stage 3b: Secondary | ICD-10-CM | POA: Diagnosis not present

## 2020-12-19 DIAGNOSIS — I129 Hypertensive chronic kidney disease with stage 1 through stage 4 chronic kidney disease, or unspecified chronic kidney disease: Secondary | ICD-10-CM | POA: Diagnosis not present

## 2020-12-19 DIAGNOSIS — N281 Cyst of kidney, acquired: Secondary | ICD-10-CM | POA: Diagnosis not present

## 2020-12-19 DIAGNOSIS — Z716 Tobacco abuse counseling: Secondary | ICD-10-CM | POA: Diagnosis not present

## 2020-12-19 DIAGNOSIS — R809 Proteinuria, unspecified: Secondary | ICD-10-CM | POA: Diagnosis not present

## 2021-01-13 DIAGNOSIS — R768 Other specified abnormal immunological findings in serum: Secondary | ICD-10-CM | POA: Diagnosis not present

## 2021-01-13 DIAGNOSIS — N1832 Chronic kidney disease, stage 3b: Secondary | ICD-10-CM | POA: Diagnosis not present

## 2021-01-13 DIAGNOSIS — R809 Proteinuria, unspecified: Secondary | ICD-10-CM | POA: Diagnosis not present

## 2021-01-13 DIAGNOSIS — Z716 Tobacco abuse counseling: Secondary | ICD-10-CM | POA: Diagnosis not present

## 2021-01-13 DIAGNOSIS — I129 Hypertensive chronic kidney disease with stage 1 through stage 4 chronic kidney disease, or unspecified chronic kidney disease: Secondary | ICD-10-CM | POA: Diagnosis not present

## 2021-01-27 DIAGNOSIS — R809 Proteinuria, unspecified: Secondary | ICD-10-CM | POA: Diagnosis not present

## 2021-01-27 DIAGNOSIS — R768 Other specified abnormal immunological findings in serum: Secondary | ICD-10-CM | POA: Diagnosis not present

## 2021-01-27 DIAGNOSIS — Z716 Tobacco abuse counseling: Secondary | ICD-10-CM | POA: Diagnosis not present

## 2021-01-27 DIAGNOSIS — N1832 Chronic kidney disease, stage 3b: Secondary | ICD-10-CM | POA: Diagnosis not present

## 2021-01-27 DIAGNOSIS — I129 Hypertensive chronic kidney disease with stage 1 through stage 4 chronic kidney disease, or unspecified chronic kidney disease: Secondary | ICD-10-CM | POA: Diagnosis not present

## 2021-02-10 ENCOUNTER — Other Ambulatory Visit: Payer: Self-pay

## 2021-02-10 ENCOUNTER — Ambulatory Visit (HOSPITAL_COMMUNITY)
Admission: RE | Admit: 2021-02-10 | Discharge: 2021-02-10 | Disposition: A | Discharge status: Home / Self Care | Payer: Medicare Other | Source: Ambulatory Visit | Attending: Nephrology | Admitting: Nephrology

## 2021-02-10 ENCOUNTER — Ambulatory Visit (HOSPITAL_COMMUNITY)
Admission: RE | Admit: 2021-02-10 | Discharge: 2021-02-10 | Disposition: A | Discharge status: Home / Self Care | Payer: Medicare Other | Source: Ambulatory Visit | Attending: Physician Assistant | Admitting: Physician Assistant

## 2021-02-10 DIAGNOSIS — N281 Cyst of kidney, acquired: Secondary | ICD-10-CM | POA: Diagnosis not present

## 2021-02-10 DIAGNOSIS — Z716 Tobacco abuse counseling: Secondary | ICD-10-CM

## 2021-02-10 DIAGNOSIS — R809 Proteinuria, unspecified: Secondary | ICD-10-CM | POA: Insufficient documentation

## 2021-02-10 DIAGNOSIS — I129 Hypertensive chronic kidney disease with stage 1 through stage 4 chronic kidney disease, or unspecified chronic kidney disease: Secondary | ICD-10-CM | POA: Insufficient documentation

## 2021-02-10 DIAGNOSIS — F17210 Nicotine dependence, cigarettes, uncomplicated: Secondary | ICD-10-CM | POA: Diagnosis not present

## 2021-02-10 DIAGNOSIS — N1832 Chronic kidney disease, stage 3b: Secondary | ICD-10-CM

## 2021-02-10 DIAGNOSIS — N189 Chronic kidney disease, unspecified: Secondary | ICD-10-CM | POA: Diagnosis not present

## 2021-02-10 DIAGNOSIS — Z87891 Personal history of nicotine dependence: Secondary | ICD-10-CM | POA: Insufficient documentation

## 2021-02-16 ENCOUNTER — Encounter: Payer: Self-pay | Admitting: Internal Medicine

## 2021-02-16 ENCOUNTER — Other Ambulatory Visit: Payer: Self-pay

## 2021-02-16 ENCOUNTER — Ambulatory Visit (INDEPENDENT_AMBULATORY_CARE_PROVIDER_SITE_OTHER): Payer: Medicare Other | Admitting: Internal Medicine

## 2021-02-16 VITALS — BP 122/58 | HR 95 | Resp 18 | Ht 61.0 in | Wt 130.1 lb

## 2021-02-16 DIAGNOSIS — F17210 Nicotine dependence, cigarettes, uncomplicated: Secondary | ICD-10-CM | POA: Diagnosis not present

## 2021-02-16 DIAGNOSIS — I739 Peripheral vascular disease, unspecified: Secondary | ICD-10-CM

## 2021-02-16 DIAGNOSIS — J432 Centrilobular emphysema: Secondary | ICD-10-CM

## 2021-02-16 DIAGNOSIS — I700 Atherosclerosis of aorta: Secondary | ICD-10-CM | POA: Diagnosis not present

## 2021-02-16 DIAGNOSIS — Z720 Tobacco use: Secondary | ICD-10-CM | POA: Diagnosis not present

## 2021-02-16 DIAGNOSIS — Z23 Encounter for immunization: Secondary | ICD-10-CM | POA: Diagnosis not present

## 2021-02-16 DIAGNOSIS — N1832 Chronic kidney disease, stage 3b: Secondary | ICD-10-CM

## 2021-02-16 DIAGNOSIS — I2510 Atherosclerotic heart disease of native coronary artery without angina pectoris: Secondary | ICD-10-CM

## 2021-02-16 NOTE — Assessment & Plan Note (Signed)
No current leg claudication symptoms Followed by Cardiology On Aspirin, Plavix and statin 

## 2021-02-16 NOTE — Assessment & Plan Note (Addendum)
Last BMP reviewed, GFR ranges around 35 now Followed by nephrology - last visit note reviewed On Lisinopril 2.5 mg now Needs to improve fluid intake, avoid soft drinks Avoid nephrotoxic agents including NSAIDs 

## 2021-02-16 NOTE — Assessment & Plan Note (Signed)
S/p stent placement Followed by Cardiology On Aspirin, Plavix and statin 

## 2021-02-16 NOTE — Progress Notes (Signed)
Established Patient Office Visit  Subjective:  Patient ID: Kaitlin [name], female    DOB: 05-04-1950  Age: 71 y.o. MRN: [id number]  CC:  Chief Complaint  Patient presents with   Follow-up    4 month follow up go over labs     HPI Kaitlin [name] is a 71 y.o. female with past medical history of CAD s/p stent placement, PAD, AAA, carotid stenosis s/p right carotid and arterectomy, HLD, COPD, CKD stage IIIb and tobacco abuse who presents for f/u of her chronic medical conditions.  She sees cardiologist for history of CAD, PAD, AAA and carotid stenosis.  She is on aspirin, Plavix and statin.  She denies any chest pain, dyspnea, LE swelling or leg claudication.  She had to take nitroglycerin yesterday for chest tightness/pain, which is resolved now.  She agrees to contact cardiology if she requires nitroglycerin again.  She has seen Dr. Theador Hawthorne for CKD stage IIIb.  Last BMP from chart has been reviewed and discussed with the patient.  She admits that she needs to improve fluid intake as she has only coffee (3 cups) as fluid intake in a day.  She also takes [facility] every day and agrees to avoid it now.  She denies any dysuria or hematuria currently.  Denies any urinary hesitance or resistance.  She has used albuterol occasionally for dyspnea.  She has history of COPD and has chronic cough.  She denies any fever, chills or wheezing currently.  She received PCV20 in the office today.  Past Medical History:  Diagnosis Date   AAA (abdominal aortic aneurysm)    Arthritis    Asthma    CAD (coronary artery disease)    stents   Calculus of gallbladder without cholecystitis without obstruction    COPD (chronic obstructive pulmonary disease) (HCC)    Emphysema of lung (HCC)    GERD (gastroesophageal reflux disease)    Hyperlipidemia    Hypertension    Iliac artery stenosis, right (HCC)    60%-70%   Myocardial infarction ([place])    Normocytic anemia 01/26/2017   PAD (peripheral artery  disease) ([place]) 12/14/2010   in the left vertebral, bilateral carotids    Past Surgical History:  Procedure Laterality Date   CARDIAC CATHETERIZATION  02/04/2005   A cypher 5.3 x 18 mm at 16 atmosphere of pressure in the mid LAD, this stent covered the proximal part of the LAD and also the mid LAD, this was then post dilated with 3.25 x 15 mm Quantum at 16 atmosphereof pressure for 45 seconds   CATARACT EXTRACTION Bilateral    CHOLECYSTECTOMY N/A 08/16/2017   Procedure: LAPAROSCOPIC CHOLECYSTECTOMY;  Surgeon: Aviva Signs, MD;  Location: [facility] ORS;  Service: General;  Laterality: N/A;   COLONOSCOPY N/A 03/01/2017   Procedure: COLONOSCOPY;  Surgeon: Danie Binder, MD;  Location: [facility] ENDO SUITE;  Service: Endoscopy;  Laterality: N/A;  1:00pm   ESOPHAGOGASTRODUODENOSCOPY N/A 01/28/2017   Procedure: ESOPHAGOGASTRODUODENOSCOPY (EGD);  Surgeon: Jerene Bears, MD;  Location: [facility] ENDOSCOPY;  Service: Gastroenterology;  Laterality: N/A;   GIVENS CAPSULE STUDY N/A 03/12/2017   Procedure: GIVENS CAPSULE STUDY;  Surgeon: Danie Binder, MD;  Location: [facility] ENDO SUITE;  Service: Endoscopy;  Laterality: N/A;  7:30am   TUBAL LIGATION      Family History  Problem Relation Age of Onset   Heart attack Mother    Hyperlipidemia Mother    Hypertension Mother    Diabetes Mother    Heart attack Father  Early death Father 19   Hyperlipidemia Father    Hypertension Father    Heart disease Sister    Hyperlipidemia Sister    Hypertension Sister    Diabetes Brother    Heart disease Sister    Hyperlipidemia Sister    Hypertension Sister    Heart attack Sister    Heart disease Sister    Hyperlipidemia Sister    Hypertension Sister    Heart attack Brother    Early death Brother 63   Hyperlipidemia Brother    Hypertension Brother    Heart attack Brother    Early death Brother 86   Hyperlipidemia Brother    Hypertension Brother    Cancer Maternal Aunt    Colon cancer Neg Hx    Colon polyps Neg Hx      Social History   Socioeconomic History   Marital status: Married    Spouse name: Marcello Moores   Number of children: 3   Years of education: 14   Highest education level: Not on file  Occupational History   Occupation: RETIRED    Comment: warehouse/textiles  Tobacco Use   Smoking status: Every Day    Packs/day: 1.00    Years: 50.00    Pack years: 50.00    Types: Cigarettes    Start date: 02/09/1967   Smokeless tobacco: Never  Vaping Use   Vaping Use: Never used  Substance and Sexual Activity   Alcohol use: No   Drug use: No   Sexual activity: Yes    Birth control/protection: Post-menopausal  Other Topics Concern   Not on file  Social History Narrative   Retired   Has an [place] in [employer] with husband Marcello Moores for 23 years   Stays busy with home, gardens, likes to can   Social Determinants of Radio broadcast assistant Strain: Not on file  Food Insecurity: Not on file  Transportation Needs: Not on file  Physical Activity: Not on file  Stress: Not on file  Social Connections: Not on file  Intimate Partner Violence: Not on file    Outpatient Medications Prior to Visit  Medication Sig Dispense Refill   albuterol (VENTOLIN HFA) 108 (90 Base) MCG/ACT inhaler Inhale 2 puffs into the lungs every 6 (six) hours as needed for wheezing or shortness of breath. 8 g 2   aspirin 81 MG chewable tablet Chew 81 mg by mouth daily.     cholecalciferol (VITAMIN D) 1000 units tablet Take 2 Units by mouth daily.      clopidogrel (PLAVIX) 75 MG tablet TAKE ONE (1) TABLET BY MOUTH EVERY DAY 90 tablet 1   ferrous sulfate 325 (65 FE) MG tablet Take 325 mg by mouth daily with breakfast.     lisinopril (ZESTRIL) 2.5 MG tablet Take 2.5 mg by mouth daily.     NITROSTAT 0.4 MG SL tablet PLACE 1 TABLET UNDER TONGUE EVERY 5 MINUTES FOR 3 DOSES AS NEEDED. 25 tablet 3   Probiotic Product (PROBIOTIC-10 PO) Take 10 mg by mouth daily.     rosuvastatin (CRESTOR) 40 MG tablet Take 1 tablet (40 mg total)  by mouth daily. 90 tablet 3   vitamin B-12 (CYANOCOBALAMIN) 500 MCG tablet Take 500 mcg by mouth daily.     Vitamin E 670 MG (1000 UT) CAPS Take by mouth.     No facility-administered medications prior to visit.    No Known Allergies  ROS Review of Systems  Constitutional:  Negative for chills and fever.  HENT:  Negative for congestion, sinus pressure, sinus pain and sore throat.   Eyes:  Negative for pain and discharge.  Respiratory:  Positive for cough. Negative for shortness of breath.   Cardiovascular:  Negative for chest pain and palpitations.  Gastrointestinal:  Negative for abdominal pain, constipation, diarrhea, nausea and vomiting.  Endocrine: Negative for polydipsia and polyuria.  Genitourinary:  Negative for dysuria and hematuria.  Musculoskeletal:  Negative for neck pain and neck stiffness.  Skin:  Negative for rash.  Neurological:  Positive for numbness. Negative for dizziness and weakness.  Psychiatric/Behavioral:  Negative for agitation and behavioral problems.      Objective:    Physical Exam Vitals reviewed.  Constitutional:      General: She is not in acute distress.    Appearance: She is not diaphoretic.  HENT:     Head: Normocephalic and atraumatic.     Nose: Nose normal.     Mouth/Throat:     Mouth: Mucous membranes are moist.  Eyes:     General: No scleral icterus.    Extraocular Movements: Extraocular movements intact.  Cardiovascular:     Rate and Rhythm: Normal rate and regular rhythm.     Pulses: Normal pulses.     Heart sounds: Normal heart sounds. No murmur heard. Pulmonary:     Breath sounds: Normal breath sounds. No wheezing or rales.  Musculoskeletal:     Cervical back: Neck supple. No tenderness.     Right lower leg: No edema.     Left lower leg: No edema.  Skin:    General: Skin is warm.     Findings: No rash.  Neurological:     General: No focal deficit present.     Mental Status: She is alert and oriented to person, place, and  time.     Sensory: No sensory deficit.     Motor: No weakness.  Psychiatric:        Mood and Affect: Mood normal.        Behavior: Behavior normal.    BP (!) 122/58 (BP Location: Right Arm, Patient Position: Sitting, Cuff Size: Normal)    Pulse 95    Resp 18    Ht _0  (1.549 m)    Wt 130 lb 1.3 oz (59 kg)    SpO2 95%    BMI 24.58 kg/m  Wt Readings from Last 3 Encounters:  02/16/21 130 lb 1.3 oz (59 kg)  12/09/20 127 lb 4.8 oz (57.7 kg)  10/17/20 129 lb 0.6 oz (58.5 kg)    Lab Results  Component Value Date   TSH 0.608 08/30/2020   Lab Results  Component Value Date   WBC 5.0 12/09/2020   HGB 13.1 12/09/2020   HCT 40.1 12/09/2020   MCV 98.8 12/09/2020   PLT 176 12/09/2020   Lab Results  Component Value Date   NA 138 12/09/2020   K 3.7 12/09/2020   CO2 28 12/09/2020   GLUCOSE 113 (H) 12/09/2020   BUN 15 12/09/2020   CREATININE 1.48 (H) 12/09/2020   BILITOT 0.7 12/09/2020   ALKPHOS 115 12/09/2020   AST 18 12/09/2020   ALT 15 12/09/2020   PROT 7.4 12/09/2020   ALBUMIN 4.0 12/09/2020   CALCIUM 9.1 12/09/2020   ANIONGAP 8 12/09/2020   EGFR 34 (L) 10/17/2020   Lab Results  Component Value Date   CHOL 112 08/13/2019   Lab Results  Component Value Date   HDL 40 (L) 08/13/2019   Lab Results  Component Value Date   LDLCALC 42 08/13/2019   Lab Results  Component Value Date   TRIG 257 (H) 08/13/2019   Lab Results  Component Value Date   CHOLHDL 2.8 08/13/2019   Lab Results  Component Value Date   HGBA1C 5.2 01/26/2017      Assessment & Plan:   Problem List Items Addressed This Visit       Cardiovascular and Mediastinum   CAD (coronary artery disease)    S/p stent placement Followed by Cardiology On Aspirin, Plavix and statin      Relevant Medications   lisinopril (ZESTRIL) 2.5 MG tablet   PAD (peripheral artery disease) (HCC)    No current leg claudication symptoms Followed by Cardiology On Aspirin, Plavix and statin      Relevant  Medications   lisinopril (ZESTRIL) 2.5 MG tablet   Atherosclerosis of aorta (HCC)    Noted on CT chest On DAPT and statin for history of PAD and CAD      Relevant Medications   lisinopril (ZESTRIL) 2.5 MG tablet     Respiratory   Emphysema lung (HCC)    Uses albuterol as needed Well controlled currently        Genitourinary   Stage 3b chronic kidney disease ([place])    Last BMP reviewed, GFR ranges around 35 now Followed by nephrology - last visit note reviewed On Lisinopril 2.5 mg now Needs to improve fluid intake, avoid soft drinks Avoid nephrotoxic agents including NSAIDs        Other   Tobacco abuse - Primary    Smokes about a pack per day  Asked about quitting: confirms that she currently smokes cigarettes Advise to quit smoking: Educated about QUITTING to reduce the risk of cancer, cardio and cerebrovascular disease. Assess willingness: Unwilling to quit at this time, but is working on cutting back. Assist with counseling and pharmacotherapy: Counseled for 5 minutes and literature provided. Arrange for follow up: Follow up in 3 months and continue to offer help.      Other Visit Diagnoses     Need for pneumococcal vaccination       Relevant Orders   Pneumococcal conjugate vaccine 20-valent (Prevnar 20) (Completed)       No orders of the defined types were placed in this encounter.   Follow-up: Return in about 6 months (around 08/16/2021) for CKD, COPD and CAD.    Lindell Spar, MD

## 2021-02-16 NOTE — Assessment & Plan Note (Signed)
Noted on CT chest On DAPT and statin for history of PAD and CAD 

## 2021-02-16 NOTE — Assessment & Plan Note (Signed)
Smokes about a pack per day  Asked about quitting: confirms that she currently smokes cigarettes Advise to quit smoking: Educated about QUITTING to reduce the risk of cancer, cardio and cerebrovascular disease. Assess willingness: Unwilling to quit at this time, but is working on cutting back. Assist with counseling and pharmacotherapy: Counseled for 5 minutes and literature provided. Arrange for follow up: Follow up in 3 months and continue to offer help. 

## 2021-02-16 NOTE — Assessment & Plan Note (Signed)
Uses albuterol as needed Well controlled currently 

## 2021-02-16 NOTE — Patient Instructions (Signed)
Please continue taking medications as prescribed.  Please continue to follow low salt diet and ambulate as tolerated. 

## 2021-03-08 ENCOUNTER — Encounter (HOSPITAL_COMMUNITY): Payer: Self-pay

## 2021-03-08 NOTE — Progress Notes (Signed)
Patient notified of LDCT Lung Cancer Screening Results via mail with the recommendation to follow-up in 12 months. Patient's referring provider has been sent a copy of results. Results are as follows:   IMPRESSION: 1. Lung-RADS 2S, benign appearance or behavior. Continue annual screening with low-dose chest CT without contrast in 12 months. 2. The "S" modifier above refers to potentially clinically significant non lung cancer related findings. Specifically, there is aortic atherosclerosis, in addition to left main and three-vessel coronary artery disease. Please note that although the presence of coronary artery calcium documents the presence of coronary artery disease, the severity of this disease and any potential stenosis cannot be assessed on this non-gated CT examination. Assessment for potential risk factor modification, dietary therapy or pharmacologic therapy may be warranted, if clinically indicated. 3. Mild diffuse bronchial wall thickening with mild centrilobular and paraseptal emphysema; imaging findings suggestive of underlying COPD.

## 2021-03-23 DIAGNOSIS — N1832 Chronic kidney disease, stage 3b: Secondary | ICD-10-CM | POA: Diagnosis not present

## 2021-03-23 DIAGNOSIS — R809 Proteinuria, unspecified: Secondary | ICD-10-CM | POA: Diagnosis not present

## 2021-03-23 DIAGNOSIS — R768 Other specified abnormal immunological findings in serum: Secondary | ICD-10-CM | POA: Diagnosis not present

## 2021-03-23 DIAGNOSIS — Z716 Tobacco abuse counseling: Secondary | ICD-10-CM | POA: Diagnosis not present

## 2021-03-23 DIAGNOSIS — I129 Hypertensive chronic kidney disease with stage 1 through stage 4 chronic kidney disease, or unspecified chronic kidney disease: Secondary | ICD-10-CM | POA: Diagnosis not present

## 2021-03-28 ENCOUNTER — Telehealth: Payer: Self-pay | Admitting: Cardiology

## 2021-03-28 MED ORDER — CLOPIDOGREL BISULFATE 75 MG PO TABS: ORAL_TABLET | ORAL | 1 refills | Status: DC

## 2021-03-28 NOTE — Telephone Encounter (Signed)
° °  1. Which medications need to be refilled? (please list name of each medication and dose if known) clopidogrel (PLAVIX) 75 MG tablet [[id number]]   2. Which pharmacy/location (including street and city if local pharmacy) is medication to be sent to?[facility]   3. Do they need a 30 day or 90 day supply? [place]  *Card scanned into patient's chart*

## 2021-03-28 NOTE — Telephone Encounter (Signed)
Completed.

## 2021-03-30 DIAGNOSIS — N1832 Chronic kidney disease, stage 3b: Secondary | ICD-10-CM | POA: Diagnosis not present

## 2021-03-30 DIAGNOSIS — I129 Hypertensive chronic kidney disease with stage 1 through stage 4 chronic kidney disease, or unspecified chronic kidney disease: Secondary | ICD-10-CM | POA: Diagnosis not present

## 2021-03-30 DIAGNOSIS — R809 Proteinuria, unspecified: Secondary | ICD-10-CM | POA: Diagnosis not present

## 2021-03-30 DIAGNOSIS — R768 Other specified abnormal immunological findings in serum: Secondary | ICD-10-CM | POA: Diagnosis not present

## 2021-03-31 ENCOUNTER — Telehealth: Payer: Self-pay | Admitting: Cardiology

## 2021-03-31 MED ORDER — CLOPIDOGREL BISULFATE 75 MG PO TABS: ORAL_TABLET | ORAL | 1 refills | Status: DC

## 2021-03-31 NOTE — Telephone Encounter (Signed)
Pt c/o medication issue: ? ?1. Name of Medication: clopidogrel (PLAVIX) 75 MG tablet ? ?2. How are you currently taking this medication (dosage and times per day)? TAKE ONE (1) TABLET BY MOUTH EVERY DAY ? ?3. Are you having a reaction (difficulty breathing--STAT)? no ? ?4. What is your medication issue? Patient called in to see if we had any sample because she hasnt received her meds through the mail. Please advise ? ? ?

## 2021-03-31 NOTE — Telephone Encounter (Signed)
Advised that we don't have plavix samples ?Advised that #30 sent to [facility]  ?

## 2021-04-21 ENCOUNTER — Encounter: Payer: Self-pay | Admitting: Cardiology

## 2021-04-21 ENCOUNTER — Ambulatory Visit (INDEPENDENT_AMBULATORY_CARE_PROVIDER_SITE_OTHER): Payer: Medicare Other | Admitting: Cardiology

## 2021-04-21 VITALS — BP 134/76 | HR 86 | Ht 61.0 in | Wt 129.4 lb

## 2021-04-21 DIAGNOSIS — I2510 Atherosclerotic heart disease of native coronary artery without angina pectoris: Secondary | ICD-10-CM | POA: Diagnosis not present

## 2021-04-21 DIAGNOSIS — I6523 Occlusion and stenosis of bilateral carotid arteries: Secondary | ICD-10-CM | POA: Diagnosis not present

## 2021-04-21 MED ORDER — CLOPIDOGREL BISULFATE 75 MG PO TABS: ORAL_TABLET | ORAL | 1 refills | Status: DC

## 2021-04-21 MED ORDER — ROSUVASTATIN CALCIUM 40 MG PO TABS: 40.0000 mg | ORAL_TABLET | Freq: Every day | ORAL | 3 refills | Status: DC

## 2021-04-21 MED ORDER — LISINOPRIL 2.5 MG PO TABS: 2.5000 mg | ORAL_TABLET | Freq: Every day | ORAL | 3 refills | Status: DC

## 2021-04-21 NOTE — Patient Instructions (Signed)
Medication Instructions:  ?Continue all current medications. ? ?Labwork: ?none ? ?Testing/Procedures: ?Your physician has requested that you have a carotid duplex. This test is an ultrasound of the carotid arteries in your neck. It looks at blood flow through these arteries that supply the brain with blood. Allow one hour for this exam. There are no restrictions or special instructions. ?Office will contact with results via phone or letter.    ? ?Follow-Up: ?6 months  ? ?Any Other Special Instructions Will Be Listed Below (If Applicable). ? ? ?If you need a refill on your cardiac medications before your next appointment, please call your pharmacy. ? ?

## 2021-04-21 NOTE — Progress Notes (Signed)
? ? ? ?Clinical Summary ?[name] is a 71 y.o.femaleseen today for follow up of the following medical problems. ?  ?1. CAD   ?- w/ DES to LAD and BMSx2 to RCA Jan 2007, reported normal LV function from notes.   ?- the RCA PCI was complicated by dissection which was also stented   ?  ?- was temporaily off ASA and plavix due to severe anemia requiring transfusion. Extensive GI workup without clear source of bleeding ?- back on ASA and plavix now.    ?  ?  ?- no chest pain, no SOB/DOE ?- compliant with meds ?  ?2. PAD   ?- 60-70% right external iliac artery stenosis by previous cath   ?- denies claudication ?  ?  ?  ?  ?3. Carotid stenosis   ?- prior right carotid endarterectomy     ? - stable follow up US 09/2017. ?- no neuro symptoms ?  ?05/2019 carotid US no significant disease ?- no recent symptoms ?  ?  ?  ?4. Hyperlipidemia   ?  ?- Jan 2021 TC 136 HDL 44 TG 361 LDL 54 ?- 07/2019 TC 112 HDL 40 TG 257 LDL 42 ?- compliant with meds, labs followed by pcp ?   ? 5. AAA ?07/2014 small AAA 3 x 3.2 cm.  ?- 12/2016 3.2 cm and stable.  ? 08/2018 AAA 3.5 cm ? ?06/2020 3.4 cm AAA, repeat 3 years in 2025 ?  ?6. Anemia ?- previously severe as low as 6.7, required transfusion ?- followed by hematology. From notes thought to be related to CKD ?- GI workup with EGD showed chronic gastritis, colonscopy no clear bleeding source.  ?  ?  ?  ?  ?  ?SH: just retired from Proofreader job. Spends most of time with great grandchildren (5 total). ?Works part time make jam. ? ? ?Past Medical History:  ?Diagnosis Date  ? AAA (abdominal aortic aneurysm)   ? Arthritis   ? Asthma   ? CAD (coronary artery disease)   ? stents  ? Calculus of gallbladder without cholecystitis without obstruction   ? COPD (chronic obstructive pulmonary disease) ([place])   ? Emphysema of lung ([place])   ? GERD (gastroesophageal reflux disease)   ? Hyperlipidemia   ? Hypertension   ? Iliac artery stenosis, right ([place])   ? 60%-70%  ? Myocardial infarction [facility])   ? Normocytic  anemia 01/26/2017  ? PAD (peripheral artery disease) ([place]) 12/14/2010  ? in the left vertebral, bilateral carotids  ? ? ? ?No Known Allergies ? ? ?Current Outpatient Medications  ?Medication Sig Dispense Refill  ? albuterol (VENTOLIN HFA) 108 (90 Base) MCG/ACT inhaler Inhale 2 puffs into the lungs every 6 (six) hours as needed for wheezing or shortness of breath. 8 g 2  ? aspirin 81 MG chewable tablet Chew 81 mg by mouth daily.    ? cholecalciferol (VITAMIN D) 1000 units tablet Take 2 Units by mouth daily.     ? clopidogrel (PLAVIX) 75 MG tablet TAKE ONE (1) TABLET BY MOUTH EVERY DAY 30 tablet 1  ? ferrous sulfate 325 (65 FE) MG tablet Take 325 mg by mouth daily with breakfast.    ? lisinopril (ZESTRIL) 2.5 MG tablet Take 2.5 mg by mouth daily.    ? NITROSTAT 0.4 MG SL tablet PLACE 1 TABLET UNDER TONGUE EVERY 5 MINUTES FOR 3 DOSES AS NEEDED. 25 tablet 3  ? Probiotic Product (PROBIOTIC-10 PO) Take 10 mg by mouth daily.    ?  rosuvastatin (CRESTOR) 40 MG tablet Take 1 tablet (40 mg total) by mouth daily. 90 tablet 3  ? vitamin B-12 (CYANOCOBALAMIN) 500 MCG tablet Take 500 mcg by mouth daily.    ? Vitamin E 670 MG (1000 UT) CAPS Take by mouth.    ? ?No current facility-administered medications for this visit.  ? ? ? ?Past Surgical History:  ?Procedure Laterality Date  ? CARDIAC CATHETERIZATION  02/04/2005  ? A cypher 5.3 x 18 mm at 16 atmosphere of pressure in the mid LAD, this stent covered the proximal part of the LAD and also the mid LAD, this was then post dilated with 3.25 x 15 mm Quantum at 16 atmosphereof pressure for 45 seconds  ? CATARACT EXTRACTION Bilateral   ? CHOLECYSTECTOMY N/A 08/16/2017  ? Procedure: LAPAROSCOPIC CHOLECYSTECTOMY;  Surgeon: Aviva Signs, MD;  Location: [facility] ORS;  Service: General;  Laterality: N/A;  ? COLONOSCOPY N/A 03/01/2017  ? Procedure: COLONOSCOPY;  Surgeon: Danie Binder, MD;  Location: [facility] ENDO SUITE;  Service: Endoscopy;  Laterality: N/A;  1:00pm  ? ESOPHAGOGASTRODUODENOSCOPY N/A  01/28/2017  ? Procedure: ESOPHAGOGASTRODUODENOSCOPY (EGD);  Surgeon: Jerene Bears, MD;  Location: [facility] ENDOSCOPY;  Service: Gastroenterology;  Laterality: N/A;  ? GIVENS CAPSULE STUDY N/A 03/12/2017  ? Procedure: GIVENS CAPSULE STUDY;  Surgeon: Danie Binder, MD;  Location: [facility] ENDO SUITE;  Service: Endoscopy;  Laterality: N/A;  7:30am  ? TUBAL LIGATION    ? ? ? ?No Known Allergies ? ? ? ?Family History  ?Problem Relation Age of Onset  ? Heart attack Mother   ? Hyperlipidemia Mother   ? Hypertension Mother   ? Diabetes Mother   ? Heart attack Father   ? Early death Father 81  ? Hyperlipidemia Father   ? Hypertension Father   ? Heart disease Sister   ? Hyperlipidemia Sister   ? Hypertension Sister   ? Diabetes Brother   ? Heart disease Sister   ? Hyperlipidemia Sister   ? Hypertension Sister   ? Heart attack Sister   ? Heart disease Sister   ? Hyperlipidemia Sister   ? Hypertension Sister   ? Heart attack Brother   ? Early death Brother 50  ? Hyperlipidemia Brother   ? Hypertension Brother   ? Heart attack Brother   ? Early death Brother 8  ? Hyperlipidemia Brother   ? Hypertension Brother   ? Cancer Maternal Aunt   ? Colon cancer Neg Hx   ? Colon polyps Neg Hx   ? ? ? ?Social History ?[name] reports that she has been smoking cigarettes. She started smoking about 54 years ago. She has a 50.00 pack-year smoking history. She has never used smokeless tobacco. ?[name] reports no history of alcohol use. ? ? ?Review of Systems ?CONSTITUTIONAL: No weight loss, fever, chills, weakness or fatigue.  ?HEENT: Eyes: No visual loss, blurred vision, double vision or yellow sclerae.No hearing loss, sneezing, congestion, runny nose or sore throat.  ?SKIN: No rash or itching.  ?CARDIOVASCULAR: per hpi ?RESPIRATORY: No shortness of breath, cough or sputum.  ?GASTROINTESTINAL: No anorexia, nausea, vomiting or diarrhea. No abdominal pain or blood.  ?GENITOURINARY: No burning on urination, no polyuria ?NEUROLOGICAL: No headache,  dizziness, syncope, paralysis, ataxia, numbness or tingling in the extremities. No change in bowel or bladder control.  ?MUSCULOSKELETAL: No muscle, back pain, joint pain or stiffness.  ?LYMPHATICS: No enlarged nodes. No history of splenectomy.  ?PSYCHIATRIC: No history of depression or anxiety.  ?ENDOCRINOLOGIC: No reports  of sweating, cold or heat intolerance. No polyuria or polydipsia.  ?. ? ? ?Physical Examination ?Today's Vitals  ? 04/21/21 1120  ?BP: 134/76  ?Pulse: 86  ?SpO2: 97%  ?Weight: 129 lb 6.4 oz (58.7 kg)  ?Height: '5\' 1"'$  (1.549 m)  ? ?Body mass index is 24.45 kg/m?. ? ?Gen: resting comfortably, no acute distress ?HEENT: no scleral icterus, pupils equal round and reactive, no palptable cervical adenopathy,  ?CV: RRR, no m/r/g no jvd ?Resp: Clear to auscultation bilaterally ?GI: abdomen is soft, non-tender, non-distended, normal bowel sounds, no hepatosplenomegaly ?MSK: extremities are warm, no edema.  ?Skin: warm, no rash ?Neuro:  no focal deficits ?Psych: appropriate affect ? ? ?Diagnostic Studies ?05/2019 carotid US ?IMPRESSION: ?Left: ?  ?Color duplex indicates moderate heterogeneous and calcified plaque, ?with no hemodynamically significant stenosis by duplex criteria in ?the extracranial cerebrovascular circulation. ?  ?Right: ?  ?Note that established duplex criteria have not been validated in the ?setting of prior endarterectomy, however, there is no evidence of ?recurrent high-grade stenosis based on the duplex. ? ? ?06/2020 AAA [place] ?IMPRESSION: ?Probable slight increase in AP diameter of the distal abdominal ?aortic aneurysm from 3.2 cm in 2022 3.4 cm currently. Recommend ?follow-up ultrasound every 3 years. This recommendation follows ACR ?consensus guidelines: White Paper of the ACR Incidental Findings ?Committee II on Vascular Findings. J Am Coll Radiol 2013; ?[id number]. ? ? ?Assessment and Plan  ?  ?1. CAD ?-  Given her history of complex RCA PCI as well as carotid and PAD would favor  continuing DAPT ?-no symptoms, continue current meds ?- EKG today SR, no ischemic changes ?  ?  ?2. Hyperlipidemia ?-upcoming labs with pcp, continue crestor ?  ?3. Carotid stenosis ?- due for repeat US, will o

## 2021-04-26 ENCOUNTER — Ambulatory Visit (HOSPITAL_COMMUNITY)
Admission: RE | Admit: 2021-04-26 | Discharge: 2021-04-26 | Disposition: A | Discharge status: Home / Self Care | Payer: Medicare Other | Source: Ambulatory Visit | Attending: Cardiology | Admitting: Cardiology

## 2021-04-26 DIAGNOSIS — I6523 Occlusion and stenosis of bilateral carotid arteries: Secondary | ICD-10-CM | POA: Diagnosis not present

## 2021-05-05 ENCOUNTER — Telehealth: Payer: Self-pay | Admitting: *Deleted

## 2021-05-05 ENCOUNTER — Encounter: Payer: Self-pay | Admitting: *Deleted

## 2021-05-05 NOTE — Telephone Encounter (Signed)
Laurine Blazer, LPN  ?09/05/7193  9:74 PM EDT Back to Top  ?  ?Patient notified via letter.  Copy to pcp.   ? ?

## 2021-05-05 NOTE — Telephone Encounter (Signed)
-----   Message from Arnoldo Lenis, MD sent at 05/01/2021  9:29 AM EDT ----- ?Carotid US shows just mild plaque, we will continue to monitor ? ?J BrancH MD ?

## 2021-06-09 ENCOUNTER — Inpatient Hospital Stay (HOSPITAL_COMMUNITY): Payer: Medicare Other | Attending: Hematology

## 2021-06-09 DIAGNOSIS — D631 Anemia in chronic kidney disease: Secondary | ICD-10-CM | POA: Insufficient documentation

## 2021-06-09 DIAGNOSIS — N183 Chronic kidney disease, stage 3 unspecified: Secondary | ICD-10-CM | POA: Insufficient documentation

## 2021-06-09 DIAGNOSIS — Z79899 Other long term (current) drug therapy: Secondary | ICD-10-CM | POA: Diagnosis not present

## 2021-06-09 DIAGNOSIS — R768 Other specified abnormal immunological findings in serum: Secondary | ICD-10-CM

## 2021-06-09 DIAGNOSIS — E611 Iron deficiency: Secondary | ICD-10-CM

## 2021-06-09 DIAGNOSIS — I129 Hypertensive chronic kidney disease with stage 1 through stage 4 chronic kidney disease, or unspecified chronic kidney disease: Secondary | ICD-10-CM | POA: Insufficient documentation

## 2021-06-09 LAB — CBC WITH DIFFERENTIAL/PLATELET
Abs Immature Granulocytes: 0.01 10*3/uL (ref 0.00–0.07)
Basophils Absolute: 0 10*3/uL (ref 0.0–0.1)
Basophils Relative: 1 %
Eosinophils Absolute: 0.2 10*3/uL (ref 0.0–0.5)
Eosinophils Relative: 4 %
HCT: 38.4 % (ref 36.0–46.0)
Hemoglobin: 12.7 g/dL (ref 12.0–15.0)
Immature Granulocytes: 0 %
Lymphocytes Relative: 33 %
Lymphs Abs: 1.4 10*3/uL (ref 0.7–4.0)
MCH: 32.3 pg (ref 26.0–34.0)
MCHC: 33.1 g/dL (ref 30.0–36.0)
MCV: 97.7 fL (ref 80.0–100.0)
Monocytes Absolute: 0.3 10*3/uL (ref 0.1–1.0)
Monocytes Relative: 6 %
Neutro Abs: 2.4 10*3/uL (ref 1.7–7.7)
Neutrophils Relative %: 56 %
Platelets: 168 10*3/uL (ref 150–400)
RBC: 3.93 MIL/uL (ref 3.87–5.11)
RDW: 13 % (ref 11.5–15.5)
WBC: 4.3 10*3/uL (ref 4.0–10.5)
nRBC: 0 % (ref 0.0–0.2)

## 2021-06-09 LAB — COMPREHENSIVE METABOLIC PANEL
ALT: 13 U/L (ref 0–44)
AST: 14 U/L — ABNORMAL LOW (ref 15–41)
Albumin: 4.2 g/dL (ref 3.5–5.0)
Alkaline Phosphatase: 101 U/L (ref 38–126)
Anion gap: 7 (ref 5–15)
BUN: 25 mg/dL — ABNORMAL HIGH (ref 8–23)
CO2: 24 mmol/L (ref 22–32)
Calcium: 9.1 mg/dL (ref 8.9–10.3)
Chloride: 106 mmol/L (ref 98–111)
Creatinine, Ser: 1.79 mg/dL — ABNORMAL HIGH (ref 0.44–1.00)
GFR, Estimated: 30 mL/min — ABNORMAL LOW (ref >60)
Glucose, Bld: 92 mg/dL (ref 70–99)
Potassium: 3.9 mmol/L (ref 3.5–5.1)
Sodium: 137 mmol/L (ref 135–145)
Total Bilirubin: 0.5 mg/dL (ref 0.3–1.2)
Total Protein: 7.6 g/dL (ref 6.5–8.1)

## 2021-06-09 LAB — FERRITIN: Ferritin: 32 ng/mL (ref 11–307)

## 2021-06-09 LAB — IRON AND TIBC
Iron: 75 ug/dL (ref 28–170)
Saturation Ratios: 20 % (ref 10.4–31.8)
TIBC: 372 ug/dL (ref 250–450)
UIBC: 297 ug/dL

## 2021-06-09 LAB — LACTATE DEHYDROGENASE: LDH: 139 U/L (ref 98–192)

## 2021-06-12 LAB — KAPPA/LAMBDA LIGHT CHAINS
Kappa free light chain: 69.8 mg/L — ABNORMAL HIGH (ref 3.3–19.4)
Kappa, lambda light chain ratio: 2.3 — ABNORMAL HIGH (ref 0.26–1.65)
Lambda free light chains: 30.4 mg/L — ABNORMAL HIGH (ref 5.7–26.3)

## 2021-06-14 LAB — PROTEIN ELECTROPHORESIS, SERUM
A/G Ratio: 1.4 (ref 0.7–1.7)
Albumin ELP: 3.9 g/dL (ref 2.9–4.4)
Alpha-1-Globulin: 0.3 g/dL (ref 0.0–0.4)
Alpha-2-Globulin: 0.9 g/dL (ref 0.4–1.0)
Beta Globulin: 0.8 g/dL (ref 0.7–1.3)
Gamma Globulin: 0.7 g/dL (ref 0.4–1.8)
Globulin, Total: 2.7 g/dL (ref 2.2–3.9)
Total Protein ELP: 6.6 g/dL (ref 6.0–8.5)

## 2021-06-15 LAB — IMMUNOFIXATION ELECTROPHORESIS
IgA: 190 mg/dL (ref 87–352)
IgG (Immunoglobin G), Serum: 829 mg/dL (ref 586–1602)
IgM (Immunoglobulin M), Srm: 47 mg/dL (ref 26–217)
Total Protein ELP: 6.7 g/dL (ref 6.0–8.5)

## 2021-06-15 NOTE — Progress Notes (Signed)
[place], [place] [address]   CLINIC:  Medical Oncology/Hematology  PCP:  Lindell Spar, MD [address] [place] [address] [phone]   REASON FOR VISIT:  Follow-up for macrocytic anemia  CURRENT THERAPY: Oral iron supplementation  INTERVAL HISTORY:  Ms. Ruest 71 y.o. 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  She was last seen by Tarri Abernethy PA-C on 12/09/2020.  At today's visit, she reports feeling well.  No recent hospitalizations, surgeries, or changes in baseline health status.  She has been taking ferrous sulfate once daily. She denies any major bleeding events such as hematemesis, hematochezia, melena, or epistaxis. She reports some increased fatigue, with energy about 40%, and reports that she tires more easily than she used to.  She does note that she does not get enough sleep at night. She denies any symptoms of fatigue, pica, restless legs, chest pain, dyspnea on exertion, lightheadedness, or syncope.  She has 40% energy and 70% appetite. She endorses that she is maintaining a stable weight.   REVIEW OF SYSTEMS:  Review of Systems  Constitutional:  Positive for fatigue. Negative for appetite change, chills, diaphoresis, fever and unexpected weight change.  HENT:   Negative for lump/mass and nosebleeds.   Eyes:  Negative for eye problems.  Respiratory:  Positive for cough and shortness of breath (COPD). Negative for hemoptysis.   Cardiovascular:  Negative for chest pain, leg swelling and palpitations.  Gastrointestinal:  Negative for abdominal pain, blood in stool, constipation, diarrhea, nausea and vomiting.  Genitourinary:  Negative for hematuria.   Musculoskeletal:  Positive for arthralgias (knee pain).  Skin: Negative.   Neurological:  Negative for dizziness, headaches and light-headedness.  Hematological:  Does not bruise/bleed  easily.     PAST MEDICAL/SURGICAL HISTORY:  Past Medical History:  Diagnosis Date   AAA (abdominal aortic aneurysm)    Arthritis    Asthma    CAD (coronary artery disease)    stents   Calculus of gallbladder without cholecystitis without obstruction    COPD (chronic obstructive pulmonary disease) ([facility])    Emphysema of lung ([facility])    GERD (gastroesophageal reflux disease)    Hyperlipidemia    Hypertension    Iliac artery stenosis, right ([facility])    60%-70%   Myocardial infarction ([place])    Normocytic anemia 01/26/2017   PAD (peripheral artery disease) ([place]) 12/14/2010   in the left vertebral, bilateral carotids   Past Surgical History:  Procedure Laterality Date   CARDIAC CATHETERIZATION  02/04/2005   A cypher 5.3 x 18 mm at 16 atmosphere of pressure in the mid LAD, this stent covered the proximal part of the LAD and also the mid LAD, this was then post dilated with 3.25 x 15 mm Quantum at 16 atmosphereof pressure for 45 seconds   CATARACT EXTRACTION Bilateral    CHOLECYSTECTOMY N/A 08/16/2017   Procedure: LAPAROSCOPIC CHOLECYSTECTOMY;  Surgeon: Aviva Signs, MD;  Location: [facility] ORS;  Service: General;  Laterality: N/A;   COLONOSCOPY N/A 03/01/2017   Procedure: COLONOSCOPY;  Surgeon: Danie Binder, MD;  Location: [facility] ENDO SUITE;  Service: Endoscopy;  Laterality: N/A;  1:00pm   ESOPHAGOGASTRODUODENOSCOPY N/A 01/28/2017   Procedure: ESOPHAGOGASTRODUODENOSCOPY (EGD);  Surgeon: Jerene Bears, MD;  Location[facility] ENDOSCOPY;  Service: Gastroenterology;  Laterality: N/A;   GIVENS CAPSULE STUDY N/A 03/12/2017   Procedure: GIVENS CAPSULE STUDY;  Surgeon: Danie Binder, MD;  Location:  [facility] ENDO SUITE;  Service: Endoscopy;  Laterality: N/A;  7:30am   TUBAL LIGATION       SOCIAL HISTORY:  Social History   Socioeconomic History   Marital status: Married    Spouse name: Marcello Moores   Number of children: 3   Years of education: 14   Highest education level: Not on file  Occupational History    Occupation: RETIRED    Comment: warehouse/textiles  Tobacco Use   Smoking status: Every Day    Packs/day: 1.00    Years: 50.00    Pack years: 50.00    Types: Cigarettes    Start date: 02/09/1967   Smokeless tobacco: Never  Vaping Use   Vaping Use: Never used  Substance and Sexual Activity   Alcohol use: No   Drug use: No   Sexual activity: Yes    Birth control/protection: Post-menopausal  Other Topics Concern   Not on file  Social History Narrative   Retired   Has an [place] in [employer] with husband Marcello Moores for 20 years   Stays busy with home, gardens, likes to can   Social Determinants of Radio broadcast assistant Strain: Not on file  Food Insecurity: Not on file  Transportation Needs: Not on file  Physical Activity: Not on file  Stress: Not on file  Social Connections: Not on file  Intimate Partner Violence: Not on file    FAMILY HISTORY:  Family History  Problem Relation Age of Onset   Heart attack Mother    Hyperlipidemia Mother    Hypertension Mother    Diabetes Mother    Heart attack Father    Early death Father 45   Hyperlipidemia Father    Hypertension Father    Heart disease Sister    Hyperlipidemia Sister    Hypertension Sister    Diabetes Brother    Heart disease Sister    Hyperlipidemia Sister    Hypertension Sister    Heart attack Sister    Heart disease Sister    Hyperlipidemia Sister    Hypertension Sister    Heart attack Brother    Early death Brother 50   Hyperlipidemia Brother    Hypertension Brother    Heart attack Brother    Early death Brother 94   Hyperlipidemia Brother    Hypertension Brother    Cancer Maternal Aunt    Colon cancer Neg Hx    Colon polyps Neg Hx     CURRENT MEDICATIONS:  Outpatient Encounter Medications as of 06/16/2021  Medication Sig   albuterol (VENTOLIN HFA) 108 (90 Base) MCG/ACT inhaler Inhale 2 puffs into the lungs every 6 (six) hours as needed for wheezing or shortness of breath.   aspirin 81 MG  chewable tablet Chew 81 mg by mouth daily.   cholecalciferol (VITAMIN D) 1000 units tablet Take 2 Units by mouth daily.    clopidogrel (PLAVIX) 75 MG tablet TAKE ONE (1) TABLET BY MOUTH EVERY DAY   ferrous sulfate 325 (65 FE) MG tablet Take 325 mg by mouth daily with breakfast.   lisinopril (ZESTRIL) 2.5 MG tablet Take 1 tablet (2.5 mg total) by mouth daily.   NITROSTAT 0.4 MG SL tablet PLACE 1 TABLET UNDER TONGUE EVERY 5 MINUTES FOR 3 DOSES AS NEEDED.   Probiotic Product (PROBIOTIC-10 PO) Take 10 mg by mouth daily.   rosuvastatin (CRESTOR) 40 MG tablet Take 1 tablet (40 mg total) by mouth daily.   vitamin B-12 (CYANOCOBALAMIN) 500 MCG tablet Take 500  mcg by mouth daily.   Vitamin E 670 MG (1000 UT) CAPS Take by mouth. (Patient not taking: Reported on 04/21/2021)   No facility-administered encounter medications on file as of 06/16/2021.    ALLERGIES:  No Known Allergies   PHYSICAL EXAM:  ECOG PERFORMANCE STATUS: 1 - Symptomatic but completely ambulatory  There were no vitals filed for this visit. There were no vitals filed for this visit. Physical Exam Constitutional:      Appearance: Normal appearance.  HENT:     Head: Normocephalic and atraumatic.     Mouth/Throat:     Mouth: Mucous membranes are moist.  Eyes:     Extraocular Movements: Extraocular movements intact.     Pupils: Pupils are equal, round, and reactive to light.  Cardiovascular:     Rate and Rhythm: Normal rate and regular rhythm.     Pulses: Normal pulses.     Heart sounds: Normal heart sounds.  Pulmonary:     Effort: Pulmonary effort is normal.     Breath sounds: Rhonchi (cleared by cough) present.  Abdominal:     General: Bowel sounds are normal.     Palpations: Abdomen is soft.     Tenderness: There is no abdominal tenderness.  Musculoskeletal:        General: No swelling.     Right lower leg: No edema.     Left lower leg: No edema.  Lymphadenopathy:     Cervical: No cervical adenopathy.  Skin:     General: Skin is warm and dry.  Neurological:     General: No focal deficit present.     Mental Status: She is alert and oriented to person, place, and time.  Psychiatric:        Mood and Affect: Mood normal.        Behavior: Behavior normal.     LABORATORY DATA:  I have reviewed the labs as listed.  CBC    Component Value Date/Time   WBC 4.3 06/09/2021 1007   RBC 3.93 06/09/2021 1007   HGB 12.7 06/09/2021 1007   HCT 38.4 06/09/2021 1007   PLT 168 06/09/2021 1007   MCV 97.7 06/09/2021 1007   MCH 32.3 06/09/2021 1007   MCHC 33.1 06/09/2021 1007   RDW 13.0 06/09/2021 1007   LYMPHSABS 1.4 06/09/2021 1007   MONOABS 0.3 06/09/2021 1007   EOSABS 0.2 06/09/2021 1007   BASOSABS 0.0 06/09/2021 1007      Latest Ref Rng & Units 06/09/2021   10:07 AM 12/09/2020    9:04 AM 10/17/2020    3:09 PM  CMP  Glucose 70 - 99 mg/dL 92   113   82    BUN 8 - 23 mg/dL '25   15   13    '$ Creatinine 0.44 - 1.00 mg/dL 1.79   1.48   1.65    Sodium 135 - 145 mmol/L 137   138   148    Potassium 3.5 - 5.1 mmol/L 3.9   3.7   3.9    Chloride 98 - 111 mmol/L 106   102   107    CO2 22 - 32 mmol/L '24   28   27    '$ Calcium 8.9 - 10.3 mg/dL 9.1   9.1   9.1    Total Protein 6.5 - 8.1 g/dL 7.6   7.4     Total Bilirubin 0.3 - 1.2 mg/dL 0.5   0.7     Alkaline Phos 38 - 126 U/L 101  115     AST 15 - 41 U/L 14   18     ALT 0 - 44 U/L 13   15       DIAGNOSTIC IMAGING:  I have independently reviewed the relevant imaging and discussed with the patient.  ASSESSMENT & PLAN: 1.  Macrocytic anemia  - Previously seen by hematology for normocytic anemia secondary to CKD, iron deficiency, and B12 deficiency.  Was lost to follow-up after August 2020, re-established care in July 2022 - EGD (01/28/2017): Gastritis, normal duodenum - Colonoscopy (03/01/2017): Redundant colon, large rectal polyp removed, external hemorrhoids - Stool cards have not been checked at our office, patient reports that one of her other providers  (Dr. Oneida Alar) checked them and they were negative. - She denies any signs or symptoms of blood loss.   - She takes ferrous sulfate daily at home.  She takes vitamin B12 at home.  She has never received IV iron.    - No clear explanation of macrocytosis: No known liver disease (liver US in 2018 was normal); folate, TSH, LDH, B12, methylmalonic acid, copper normal.  No reticulocytosis. - Most recent labs (06/09/2021): Normal CBC with Hgb 12.7/MCV 97.7, ferritin 32, iron saturation 20% - Differential diagnosis favors anemia related to CKD stage IIIb/IV; cause of macrocytosis unclear, but this has resolved - PLAN: CBC and iron panel are currently within normal limits.  Macrocytosis has resolved. - We will give IV iron with Monoferric x1 due to fatigue in the setting of ferritin <100.  We discussed potential side effects of IV iron, including the rare chance of allergic reaction.  Patient is agreeable with proceeding. - Continue ferrous sulfate daily.   - Repeat labs and RTC in 6 months   2.  Elevated serum free light chains - MGUS panel (08/30/2020): SPEP and immunofixation normal, elevated light chains with kappa 66.9/lambda 32.3/ratio 2.15 - Repeat MGUS panel (06/09/2021): SPEP and immunofixation normal.  Elevated kappa light chain 69.8, elevated lambda light chain 30.4.  Free light chain ratio is slightly elevated at 2.30, but this would be considered within normal limits for patient with CKD.  LDH normal. - Elevated light chains likely secondary to CKD - PLAN: No further work-up at this time.  3.  CKD stage III/IV - Patient has been referred to nephrology by her primary care provider  -Most recent CMP (06/09/2021): Creatinine 1.79/GFR 30 - PLAN: Continue follow-up with Dr. Theador Hawthorne   4.  Tobacco abuse - This patient meets criteria for low-dose CT lung cancer screening.  She has smoked 1 to 2 packs/day since age 31, currently smoking 1 pack/day but trying to quit. - Shared decision making visit on  12/09/2020 - LDCT chest (02/10/2021): Lung RADS category 2S, benign appearance or behavior of lung nodules, but with aortic atherosclerosis and left main and three-vessel coronary artery disease - PLAN: Continue annual LDCT screening (next due January 2024).  Follow-up with PCP regarding other findings.   PLAN SUMMARY & DISPOSITION: Monoferric x1 Labs in 6 months (CBC, CMP, iron panel) RTC after labs  All questions were answered. The patient knows to call the clinic with any problems, questions or concerns.  Medical decision making: Moderate  Time spent on visit: I spent 20 minutes counseling the patient face to face. The total time spent in the appointment was 30 minutes and more than 50% was on counseling.   Harriett Rush, PA-C  06/16/2021 10:43 AM

## 2021-06-16 ENCOUNTER — Inpatient Hospital Stay (HOSPITAL_BASED_OUTPATIENT_CLINIC_OR_DEPARTMENT_OTHER): Payer: Medicare Other | Admitting: Physician Assistant

## 2021-06-16 VITALS — BP 101/65 | HR 89 | Temp 98.2°F | Resp 18 | Ht 61.0 in | Wt 126.9 lb

## 2021-06-16 DIAGNOSIS — R768 Other specified abnormal immunological findings in serum: Secondary | ICD-10-CM | POA: Diagnosis not present

## 2021-06-16 DIAGNOSIS — D508 Other iron deficiency anemias: Secondary | ICD-10-CM

## 2021-06-16 DIAGNOSIS — D631 Anemia in chronic kidney disease: Secondary | ICD-10-CM

## 2021-06-16 DIAGNOSIS — N189 Chronic kidney disease, unspecified: Secondary | ICD-10-CM | POA: Diagnosis not present

## 2021-06-16 DIAGNOSIS — D509 Iron deficiency anemia, unspecified: Secondary | ICD-10-CM | POA: Insufficient documentation

## 2021-06-16 DIAGNOSIS — E611 Iron deficiency: Secondary | ICD-10-CM | POA: Diagnosis not present

## 2021-06-16 DIAGNOSIS — N183 Chronic kidney disease, stage 3 unspecified: Secondary | ICD-10-CM | POA: Diagnosis not present

## 2021-06-16 DIAGNOSIS — I129 Hypertensive chronic kidney disease with stage 1 through stage 4 chronic kidney disease, or unspecified chronic kidney disease: Secondary | ICD-10-CM | POA: Diagnosis not present

## 2021-06-16 DIAGNOSIS — Z79899 Other long term (current) drug therapy: Secondary | ICD-10-CM | POA: Diagnosis not present

## 2021-06-16 NOTE — Patient Instructions (Addendum)
[place] at [facility] Discharge Instructions  You were seen today by Tarri Abernethy PA-C for your history of anemia.  Your blood count looks great, but your iron is a bit lower than I would like it to be. - Since you still have borderline low iron despite taking the iron tablet every day, we will give you an IV iron treatment to see if that improves your levels.  It may also help improve your fatigue! - You should still continue to take your iron tablet once daily at home. - We will see you for labs and follow-up visit again in 6 months.    LABS: Return in 6 months for repeat labs  MEDICATIONS: Continue iron tablets once daily  FOLLOW-UP APPOINTMENT: Office visit in 6 months, after labs   Thank you for choosing [place] at [facility] to provide your oncology and hematology care.  To afford each patient quality time with our provider, please arrive at least 15 minutes before your scheduled appointment time.   If you have a lab appointment with the [facility] please come in thru the Main Entrance and check in at the main information desk.  You need to re-schedule your appointment should you arrive 10 or more minutes late.  We strive to give you quality time with our providers, and arriving late affects you and other patients whose appointments are after yours.  Also, if you no show three or more times for appointments you may be dismissed from the clinic at the providers discretion.     Again, thank you for choosing [facility].  Our hope is that these requests will decrease the amount of time that you wait before being seen by our physicians.       _____________________________________________________________  Should you have questions after your visit to [facility], please contact our office at [phone] and follow the prompts.  Our office hours are 8:00 a.m. and 4:30 p.m. Monday - Friday.  Please note  that voicemails left after 4:00 p.m. may not be returned until the following business day.  We are closed weekends and major holidays.  You do have access to a nurse 24-7, just call the main number to the clinic [phone] and do not press any options, hold on the line and a nurse will answer the phone.    For prescription refill requests, have your pharmacy contact our office and allow 72 hours.    Due to Covid, you will need to wear a mask upon entering the hospital. If you do not have a mask, a mask will be given to you at the Main Entrance upon arrival. For doctor visits, patients may have 1 support person age 34 or older with them. For treatment visits, patients can not have anyone with them due to social distancing guidelines and our immunocompromised population.

## 2021-06-22 ENCOUNTER — Inpatient Hospital Stay (HOSPITAL_COMMUNITY): Payer: Medicare Other

## 2021-06-22 VITALS — BP 96/68 | HR 78 | Temp 97.0°F | Resp 18

## 2021-06-22 DIAGNOSIS — I129 Hypertensive chronic kidney disease with stage 1 through stage 4 chronic kidney disease, or unspecified chronic kidney disease: Secondary | ICD-10-CM | POA: Diagnosis not present

## 2021-06-22 DIAGNOSIS — N183 Chronic kidney disease, stage 3 unspecified: Secondary | ICD-10-CM | POA: Diagnosis not present

## 2021-06-22 DIAGNOSIS — D649 Anemia, unspecified: Secondary | ICD-10-CM

## 2021-06-22 DIAGNOSIS — Z79899 Other long term (current) drug therapy: Secondary | ICD-10-CM | POA: Diagnosis not present

## 2021-06-22 DIAGNOSIS — D508 Other iron deficiency anemias: Secondary | ICD-10-CM

## 2021-06-22 DIAGNOSIS — D631 Anemia in chronic kidney disease: Secondary | ICD-10-CM | POA: Diagnosis not present

## 2021-06-22 MED ORDER — SODIUM CHLORIDE 0.9 % IV SOLN
1000.0000 mg | Freq: Once | INTRAVENOUS | Status: AC
  Administered 2021-06-22: 1000 mg via INTRAVENOUS
  Filled 2021-06-22: qty 10

## 2021-06-22 MED ORDER — ACETAMINOPHEN 325 MG PO TABS
650.0000 mg | ORAL_TABLET | Freq: Once | ORAL | Status: AC
  Administered 2021-06-22: 650 mg via ORAL
  Filled 2021-06-22: qty 2

## 2021-06-22 MED ORDER — LORATADINE 10 MG PO TABS
10.0000 mg | ORAL_TABLET | Freq: Once | ORAL | Status: AC
  Administered 2021-06-22: 10 mg via ORAL
  Filled 2021-06-22: qty 1

## 2021-06-22 MED ORDER — SODIUM CHLORIDE 0.9 % IV SOLN: Freq: Once | INTRAVENOUS | Status: AC

## 2021-06-22 NOTE — Progress Notes (Signed)
Pt presents today for Monoferric IV iron infusion per provider's order. Vital signs stable and pt voiced no new complaints at this time.  Peripheral IV started with good blood return pre and post infusion.  Monoferric given today per MD orders. Tolerated infusion without adverse affects. Vital signs stable. No complaints at this time. Discharged from clinic ambulatory in stable condition. Alert and oriented x 3. F/U with [facility] as scheduled.

## 2021-06-22 NOTE — Patient Instructions (Signed)
Kaitlin [name]  Discharge Instructions: Thank you for choosing [facility] to provide your oncology and hematology care.  If you have a lab appointment with the [facility], please come in thru the Main Entrance and check in at the main information desk.  Wear comfortable clothing and clothing appropriate for easy access to any Portacath or PICC line.   We strive to give you quality time with your provider. You may need to reschedule your appointment if you arrive late (15 or more minutes).  Arriving late affects you and other patients whose appointments are after yours.  Also, if you miss three or more appointments without notifying the office, you may be dismissed from the clinic at the provider's discretion.      For prescription refill requests, have your pharmacy contact our office and allow 72 hours for refills to be completed.    Today you received Monoferric IV iron infusion.    BELOW ARE SYMPTOMS THAT SHOULD BE REPORTED IMMEDIATELY: *FEVER GREATER THAN 100.4 F (38 C) OR HIGHER *CHILLS OR SWEATING *NAUSEA AND VOMITING THAT IS NOT CONTROLLED WITH YOUR NAUSEA MEDICATION *UNUSUAL SHORTNESS OF BREATH *UNUSUAL BRUISING OR BLEEDING *URINARY PROBLEMS (pain or burning when urinating, or frequent urination) *BOWEL PROBLEMS (unusual diarrhea, constipation, pain near the anus) TENDERNESS IN MOUTH AND THROAT WITH OR WITHOUT PRESENCE OF ULCERS (sore throat, sores in mouth, or a toothache) UNUSUAL RASH, SWELLING OR PAIN  UNUSUAL VAGINAL DISCHARGE OR ITCHING   Items with * indicate a potential emergency and should be followed up as soon as possible or go to the Emergency Department if any problems should occur.  Please show the CHEMOTHERAPY ALERT CARD or IMMUNOTHERAPY ALERT CARD at check-in to the Emergency Department and triage nurse.  Should you have questions after your visit or need to cancel or reschedule your appointment, please contact [facility]  [phone]  and follow the prompts.  Office hours are 8:00 a.m. to 4:30 p.m. Monday - Friday. Please note that voicemails left after 4:00 p.m. may not be returned until the following business day.  We are closed weekends and major holidays. You have access to a nurse at all times for urgent questions. Please call the main number to the clinic [phone] and follow the prompts.  For any non-urgent questions, you may also contact your provider using MyChart. We now offer e-Visits for anyone 62 and older to request care online for non-urgent symptoms. For details visit [email or web address].   Also download the MyChart app! Go to the app store, search "MyChart", open the app, select [facility], and log in with your MyChart username and password.  Due to Covid, a mask is required upon entering the hospital/clinic. If you do not have a mask, one will be given to you upon arrival. For doctor visits, patients may have 1 support person aged 38 or older with them. For treatment visits, patients cannot have anyone with them due to current Covid guidelines and our immunocompromised population.

## 2021-06-30 DIAGNOSIS — I129 Hypertensive chronic kidney disease with stage 1 through stage 4 chronic kidney disease, or unspecified chronic kidney disease: Secondary | ICD-10-CM | POA: Diagnosis not present

## 2021-06-30 DIAGNOSIS — R809 Proteinuria, unspecified: Secondary | ICD-10-CM | POA: Diagnosis not present

## 2021-06-30 DIAGNOSIS — N1832 Chronic kidney disease, stage 3b: Secondary | ICD-10-CM | POA: Diagnosis not present

## 2021-06-30 DIAGNOSIS — R768 Other specified abnormal immunological findings in serum: Secondary | ICD-10-CM | POA: Diagnosis not present

## 2021-07-06 DIAGNOSIS — Z716 Tobacco abuse counseling: Secondary | ICD-10-CM | POA: Diagnosis not present

## 2021-07-06 DIAGNOSIS — R809 Proteinuria, unspecified: Secondary | ICD-10-CM | POA: Diagnosis not present

## 2021-07-06 DIAGNOSIS — N1832 Chronic kidney disease, stage 3b: Secondary | ICD-10-CM | POA: Diagnosis not present

## 2021-07-06 DIAGNOSIS — I129 Hypertensive chronic kidney disease with stage 1 through stage 4 chronic kidney disease, or unspecified chronic kidney disease: Secondary | ICD-10-CM | POA: Diagnosis not present

## 2021-07-24 ENCOUNTER — Ambulatory Visit (INDEPENDENT_AMBULATORY_CARE_PROVIDER_SITE_OTHER): Payer: Medicare Other

## 2021-07-24 VITALS — BP 113/63 | HR 95 | Ht 61.0 in | Wt 125.1 lb

## 2021-07-24 DIAGNOSIS — Z0000 Encounter for general adult medical examination without abnormal findings: Secondary | ICD-10-CM

## 2021-08-17 ENCOUNTER — Telehealth: Payer: Self-pay | Admitting: Internal Medicine

## 2021-08-17 ENCOUNTER — Ambulatory Visit (INDEPENDENT_AMBULATORY_CARE_PROVIDER_SITE_OTHER): Payer: Medicare Other | Admitting: Internal Medicine

## 2021-08-17 ENCOUNTER — Encounter: Payer: Self-pay | Admitting: Internal Medicine

## 2021-08-17 VITALS — BP 96/62 | HR 100 | Ht 61.0 in | Wt 125.2 lb

## 2021-08-17 DIAGNOSIS — E782 Mixed hyperlipidemia: Secondary | ICD-10-CM

## 2021-08-17 DIAGNOSIS — J432 Centrilobular emphysema: Secondary | ICD-10-CM | POA: Diagnosis not present

## 2021-08-17 DIAGNOSIS — Z1231 Encounter for screening mammogram for malignant neoplasm of breast: Secondary | ICD-10-CM | POA: Diagnosis not present

## 2021-08-17 DIAGNOSIS — I2510 Atherosclerotic heart disease of native coronary artery without angina pectoris: Secondary | ICD-10-CM | POA: Diagnosis not present

## 2021-08-17 DIAGNOSIS — Z780 Asymptomatic menopausal state: Secondary | ICD-10-CM

## 2021-08-17 DIAGNOSIS — I714 Abdominal aortic aneurysm, without rupture, unspecified: Secondary | ICD-10-CM

## 2021-08-17 DIAGNOSIS — F17210 Nicotine dependence, cigarettes, uncomplicated: Secondary | ICD-10-CM | POA: Diagnosis not present

## 2021-08-17 DIAGNOSIS — N1832 Chronic kidney disease, stage 3b: Secondary | ICD-10-CM | POA: Diagnosis not present

## 2021-08-17 DIAGNOSIS — Z720 Tobacco use: Secondary | ICD-10-CM

## 2021-08-17 NOTE — Assessment & Plan Note (Addendum)
Stable in size, last US abdomen reviewed Next US abdomen for AAA follow-up in 2 years 

## 2021-08-17 NOTE — Assessment & Plan Note (Signed)
Last BMP reviewed, GFR ranges around 35 now Followed by nephrology - last visit note reviewed On Lisinopril 2.5 mg now Needs to improve fluid intake, avoid soft drinks Avoid nephrotoxic agents including NSAIDs 

## 2021-08-17 NOTE — Assessment & Plan Note (Signed)
Uses albuterol as needed Well controlled currently 

## 2021-08-17 NOTE — Progress Notes (Signed)
Established Patient Office Visit  Subjective:  Patient ID: Kaitlin [name], female    DOB: 04-07-1950  Age: 71 y.o. MRN: [id number]  CC:  Chief Complaint  Patient presents with   Follow-up    6 month follow up, no complaints.     HPI Kaitlin [name] is a 71 y.o. female with past medical history of CAD s/p stent placement, PAD, AAA, carotid stenosis s/p right carotid and arterectomy, HLD, COPD, CKD stage IIIb and tobacco abuse who presents for f/u of her chronic medical conditions.  She sees cardiologist for history of CAD, PAD, AAA and carotid stenosis.  She is on aspirin, Plavix and statin.  She denies any chest pain, dyspnea, LE swelling or leg claudication.  She has seen Dr. Theador Hawthorne for CKD stage IIIb.  Last BMP from chart has been reviewed and discussed with the patient.  She admits that she needs to improve fluid intake as she has only coffee (3 cups) as fluid intake in a day.  She also takes [facility] every day and agrees to avoid it now.  She denies any dysuria or hematuria currently.  Denies any urinary hesitance or resistance.  She has used albuterol occasionally for dyspnea.  She has history of COPD and has chronic cough.  She denies any fever, chills or wheezing currently.     Past Medical History:  Diagnosis Date   AAA (abdominal aortic aneurysm) (HCC)    Arthritis    Asthma    CAD (coronary artery disease)    stents   Calculus of gallbladder without cholecystitis without obstruction    COPD (chronic obstructive pulmonary disease) (HCC)    Emphysema of lung (HCC)    GERD (gastroesophageal reflux disease)    Hyperlipidemia    Hypertension    Iliac artery stenosis, right (HCC)    60%-70%   Myocardial infarction ([place])    Normocytic anemia 01/26/2017   PAD (peripheral artery disease) ([facility]) 12/14/2010   in the left vertebral, bilateral carotids    Past Surgical History:  Procedure Laterality Date   CARDIAC CATHETERIZATION  02/04/2005   A cypher 5.3 x 18 mm at  16 atmosphere of pressure in the mid LAD, this stent covered the proximal part of the LAD and also the mid LAD, this was then post dilated with 3.25 x 15 mm Quantum at 16 atmosphereof pressure for 45 seconds   CATARACT EXTRACTION Bilateral    CHOLECYSTECTOMY N/A 08/16/2017   Procedure: LAPAROSCOPIC CHOLECYSTECTOMY;  Surgeon: Aviva Signs, MD;  Location: [facility] ORS;  Service: General;  Laterality: N/A;   COLONOSCOPY N/A 03/01/2017   Procedure: COLONOSCOPY;  Surgeon: Danie Binder, MD;  Location: [facility] ENDO SUITE;  Service: Endoscopy;  Laterality: N/A;  1:00pm   ESOPHAGOGASTRODUODENOSCOPY N/A 01/28/2017   Procedure: ESOPHAGOGASTRODUODENOSCOPY (EGD);  Surgeon: Jerene Bears, MD;  Location: [facility] ENDOSCOPY;  Service: Gastroenterology;  Laterality: N/A;   GIVENS CAPSULE STUDY N/A 03/12/2017   Procedure: GIVENS CAPSULE STUDY;  Surgeon: Danie Binder, MD;  Location: [facility] ENDO SUITE;  Service: Endoscopy;  Laterality: N/A;  7:30am   TUBAL LIGATION      Family History  Problem Relation Age of Onset   Heart attack Mother    Hyperlipidemia Mother    Hypertension Mother    Diabetes Mother    Heart attack Father    Early death Father 70   Hyperlipidemia Father    Hypertension Father    Heart disease Sister    Hyperlipidemia Sister  Hypertension Sister    Diabetes Brother    Heart disease Sister    Hyperlipidemia Sister    Hypertension Sister    Heart attack Sister    Heart disease Sister    Hyperlipidemia Sister    Hypertension Sister    Heart attack Brother    Early death Brother 76   Hyperlipidemia Brother    Hypertension Brother    Heart attack Brother    Early death Brother 57   Hyperlipidemia Brother    Hypertension Brother    Cancer Maternal Aunt    Colon cancer Neg Hx    Colon polyps Neg Hx     Social History   Socioeconomic History   Marital status: Married    Spouse name: Marcello Moores   Number of children: 3   Years of education: 14   Highest education level: Not on file   Occupational History   Occupation: RETIRED    Comment: warehouse/textiles  Tobacco Use   Smoking status: Every Day    Packs/day: 1.00    Years: 50.00    Total pack years: 50.00    Types: Cigarettes    Start date: 02/09/1967   Smokeless tobacco: Never  Vaping Use   Vaping Use: Never used  Substance and Sexual Activity   Alcohol use: No   Drug use: No   Sexual activity: Yes    Birth control/protection: Post-menopausal  Other Topics Concern   Not on file  Social History Narrative   Retired   Has an [place] in [employer] with husband Marcello Moores for 23 years   Stays busy with home, gardens, likes to can   Social Determinants of Health   Financial Resource Strain: [place]  (07/24/2021)   Overall Financial Resource Strain (CARDIA)    Difficulty of Paying Living Expenses: Not hard at all  Food Insecurity: No [place] (07/24/2021)   Hunger Vital Sign    Worried About Running Out of Food in the Last Year: Never true    [place] in the Last Year: Never true  Transportation Needs: No Transportation Needs (07/24/2021)   PRAPARE - Hydrologist (Medical): No    Lack of Transportation (Non-Medical): No  Physical Activity: Sufficiently Active (07/24/2021)   Exercise Vital Sign    Days of Exercise per Week: 7 days    Minutes of Exercise per Session: 40 min  Stress: No Stress Concern Present (07/24/2021)   [place]    Feeling of Stress : Not at all  Social Connections: Unknown (07/24/2021)   Social Connection and Isolation Panel [NHANES]    Frequency of Communication with Friends and Family: More than three times a week    Frequency of Social Gatherings with Friends and Family: Three times a week    Attends Religious Services: Never    Active Member of Clubs or Organizations: No    Attends Archivist Meetings: Never    Marital Status: Patient refused  Intimate Partner  Violence: Not At Risk (07/24/2021)   Humiliation, Afraid, Rape, and Kick questionnaire    Fear of Current or Ex-Partner: No    Emotionally Abused: No    Physically Abused: No    Sexually Abused: No    Outpatient Medications Prior to Visit  Medication Sig Dispense Refill   albuterol (VENTOLIN HFA) 108 (90 Base) MCG/ACT inhaler Inhale 2 puffs into the lungs every 6 (six) hours as needed for  wheezing or shortness of breath. 8 g 2   aspirin 81 MG chewable tablet Chew 81 mg by mouth daily.     cholecalciferol (VITAMIN D) 1000 units tablet Take 2 Units by mouth daily.      clopidogrel (PLAVIX) 75 MG tablet TAKE ONE (1) TABLET BY MOUTH EVERY DAY 30 tablet 1   ferrous sulfate 325 (65 FE) MG tablet Take 325 mg by mouth daily with breakfast.     lisinopril (ZESTRIL) 2.5 MG tablet Take 1 tablet (2.5 mg total) by mouth daily. 90 tablet 3   NITROSTAT 0.4 MG SL tablet PLACE 1 TABLET UNDER TONGUE EVERY 5 MINUTES FOR 3 DOSES AS NEEDED. 25 tablet 3   Probiotic Product (PROBIOTIC-10 PO) Take 10 mg by mouth daily.     rosuvastatin (CRESTOR) 40 MG tablet Take 1 tablet (40 mg total) by mouth daily. 90 tablet 3   vitamin B-12 (CYANOCOBALAMIN) 500 MCG tablet Take 500 mcg by mouth daily.     No facility-administered medications prior to visit.    No Known Allergies  ROS Review of Systems  Constitutional:  Negative for chills and fever.  HENT:  Negative for congestion, sinus pressure, sinus pain and sore throat.   Eyes:  Negative for pain and discharge.  Respiratory:  Negative for cough and shortness of breath.   Cardiovascular:  Negative for chest pain and palpitations.  Gastrointestinal:  Negative for abdominal pain, constipation, diarrhea, nausea and vomiting.  Endocrine: Negative for polydipsia and polyuria.  Genitourinary:  Negative for dysuria and hematuria.  Musculoskeletal:  Negative for neck pain and neck stiffness.  Skin:  Negative for rash.  Neurological:  Negative for dizziness and weakness.   Psychiatric/Behavioral:  Negative for agitation and behavioral problems.       Objective:    Physical Exam Vitals reviewed.  Constitutional:      General: She is not in acute distress.    Appearance: She is not diaphoretic.  HENT:     Head: Normocephalic and atraumatic.     Nose: Nose normal.     Mouth/Throat:     Mouth: Mucous membranes are moist.  Eyes:     General: No scleral icterus.    Extraocular Movements: Extraocular movements intact.  Cardiovascular:     Rate and Rhythm: Normal rate and regular rhythm.     Pulses: Normal pulses.     Heart sounds: Normal heart sounds. No murmur heard. Pulmonary:     Breath sounds: Normal breath sounds. No wheezing or rales.  Musculoskeletal:     Cervical back: Neck supple. No tenderness.     Right lower leg: No edema.     Left lower leg: No edema.  Skin:    General: Skin is warm.     Findings: No rash.  Neurological:     General: No focal deficit present.     Mental Status: She is alert and oriented to person, place, and time.     Sensory: No sensory deficit.     Motor: No weakness.  Psychiatric:        Mood and Affect: Mood normal.        Behavior: Behavior normal.     BP 96/62   Pulse 100   Ht '5\' 1"'  (1.549 m)   Wt 125 lb 3.2 oz (56.8 kg)   SpO2 95%   BMI 23.66 kg/m  Wt Readings from Last 3 Encounters:  08/17/21 125 lb 3.2 oz (56.8 kg)  07/24/21 125 lb 1.9 oz (56.8 kg)  06/16/21 126 lb 14.4 oz (57.6 kg)    Lab Results  Component Value Date   TSH 0.608 08/30/2020   Lab Results  Component Value Date   WBC 4.3 06/09/2021   HGB 12.7 06/09/2021   HCT 38.4 06/09/2021   MCV 97.7 06/09/2021   PLT 168 06/09/2021   Lab Results  Component Value Date   NA 137 06/09/2021   K 3.9 06/09/2021   CO2 24 06/09/2021   GLUCOSE 92 06/09/2021   BUN 25 (H) 06/09/2021   CREATININE 1.79 (H) 06/09/2021   BILITOT 0.5 06/09/2021   ALKPHOS 101 06/09/2021   AST 14 (L) 06/09/2021   ALT 13 06/09/2021   PROT 7.6 06/09/2021    ALBUMIN 4.2 06/09/2021   CALCIUM 9.1 06/09/2021   ANIONGAP 7 06/09/2021   EGFR 34 (L) 10/17/2020   Lab Results  Component Value Date   CHOL 112 08/13/2019   Lab Results  Component Value Date   HDL 40 (L) 08/13/2019   Lab Results  Component Value Date   LDLCALC 42 08/13/2019   Lab Results  Component Value Date   TRIG 257 (H) 08/13/2019   Lab Results  Component Value Date   CHOLHDL 2.8 08/13/2019   Lab Results  Component Value Date   HGBA1C 5.2 01/26/2017      Assessment & Plan:   Problem List Items Addressed This Visit       Cardiovascular and Mediastinum   CAD (coronary artery disease)    S/p stent placement Followed by Cardiology On Aspirin, Plavix and statin      AAA (abdominal aortic aneurysm) (HCC)    Stable in size, last US abdomen reviewed Next US abdomen for AAA follow-up in 2 years        Respiratory   Emphysema lung ([place]) - Primary    Uses albuterol as needed Well controlled currently        Genitourinary   Stage 3b chronic kidney disease ([place])    Last BMP reviewed, GFR ranges around 35 now Followed by nephrology - last visit note reviewed On Lisinopril 2.5 mg now Needs to improve fluid intake, avoid soft drinks Avoid nephrotoxic agents including NSAIDs        Other   Hyperlipidemia    On statin      Tobacco abuse    Smokes about a pack per day  Asked about quitting: confirms that she currently smokes cigarettes Advise to quit smoking: Educated about QUITTING to reduce the risk of cancer, cardio and cerebrovascular disease. Assess willingness: Unwilling to quit at this time, but is working on cutting back. Assist with counseling and pharmacotherapy: Counseled for 5 minutes and literature provided. Arrange for follow up: Follow up in 3 months and continue to offer help.      Other Visit Diagnoses     Postmenopausal       Relevant Orders   DG Bone Density   Encounter for screening mammogram for malignant neoplasm of breast        Relevant Orders   MM 3D SCREEN BREAST BILATERAL       No orders of the defined types were placed in this encounter.   Follow-up: Return in about 6 months (around 02/17/2022) for CKD, COPD and CAD.    Lindell Spar, MD

## 2021-08-17 NOTE — Assessment & Plan Note (Signed)
S/p stent placement Followed by Cardiology On Aspirin, Plavix and statin 

## 2021-08-17 NOTE — Patient Instructions (Signed)
Please continue taking medications as prescribed.  Please continue to follow low salt diet and ambulate as tolerated.  Please consider getting Shingrix and Tdap vaccine at your local pharmacy.

## 2021-08-17 NOTE — Assessment & Plan Note (Signed)
Smokes about a pack per day  Asked about quitting: confirms that she currently smokes cigarettes Advise to quit smoking: Educated about QUITTING to reduce the risk of cancer, cardio and cerebrovascular disease. Assess willingness: Unwilling to quit at this time, but is working on cutting back. Assist with counseling and pharmacotherapy: Counseled for 5 minutes and literature provided. Arrange for follow up: Follow up in 3 months and continue to offer help.

## 2021-08-17 NOTE — Assessment & Plan Note (Signed)
On statin.

## 2021-08-17 NOTE — Telephone Encounter (Signed)
Per Radiology pt is needing a bilateral mammogram not a screening. Can you please change this order?

## 2021-08-21 ENCOUNTER — Other Ambulatory Visit: Payer: Self-pay | Admitting: Internal Medicine

## 2021-08-21 ENCOUNTER — Telehealth: Payer: Self-pay | Admitting: Internal Medicine

## 2021-08-21 ENCOUNTER — Other Ambulatory Visit: Payer: Self-pay | Admitting: *Deleted

## 2021-08-21 DIAGNOSIS — Z1231 Encounter for screening mammogram for malignant neoplasm of breast: Secondary | ICD-10-CM

## 2021-08-21 DIAGNOSIS — R928 Other abnormal and inconclusive findings on diagnostic imaging of breast: Secondary | ICD-10-CM

## 2021-08-21 DIAGNOSIS — Z87898 Personal history of other specified conditions: Secondary | ICD-10-CM

## 2021-08-21 NOTE — Telephone Encounter (Signed)
Pt was seen last week & order for mammogram needed to be changed from screening to diagnostic. Can you please change this so I can get her scheduled?

## 2021-08-21 NOTE — Telephone Encounter (Signed)
Diagnostic mammo ordered please schedule

## 2021-09-12 ENCOUNTER — Ambulatory Visit (HOSPITAL_COMMUNITY)
Admission: RE | Admit: 2021-09-12 | Discharge: 2021-09-12 | Disposition: A | Discharge status: Home / Self Care | Payer: Medicare Other | Source: Ambulatory Visit | Attending: Internal Medicine | Admitting: Internal Medicine

## 2021-09-12 DIAGNOSIS — R928 Other abnormal and inconclusive findings on diagnostic imaging of breast: Secondary | ICD-10-CM

## 2021-09-12 DIAGNOSIS — Z13820 Encounter for screening for osteoporosis: Secondary | ICD-10-CM | POA: Insufficient documentation

## 2021-09-12 DIAGNOSIS — M810 Age-related osteoporosis without current pathological fracture: Secondary | ICD-10-CM | POA: Diagnosis not present

## 2021-09-12 DIAGNOSIS — Z780 Asymptomatic menopausal state: Secondary | ICD-10-CM | POA: Diagnosis not present

## 2021-10-05 DIAGNOSIS — I129 Hypertensive chronic kidney disease with stage 1 through stage 4 chronic kidney disease, or unspecified chronic kidney disease: Secondary | ICD-10-CM | POA: Diagnosis not present

## 2021-10-05 DIAGNOSIS — R809 Proteinuria, unspecified: Secondary | ICD-10-CM | POA: Diagnosis not present

## 2021-10-05 DIAGNOSIS — N1832 Chronic kidney disease, stage 3b: Secondary | ICD-10-CM | POA: Diagnosis not present

## 2021-10-05 DIAGNOSIS — Z716 Tobacco abuse counseling: Secondary | ICD-10-CM | POA: Diagnosis not present

## 2021-10-12 DIAGNOSIS — I129 Hypertensive chronic kidney disease with stage 1 through stage 4 chronic kidney disease, or unspecified chronic kidney disease: Secondary | ICD-10-CM | POA: Diagnosis not present

## 2021-10-12 DIAGNOSIS — N1832 Chronic kidney disease, stage 3b: Secondary | ICD-10-CM | POA: Diagnosis not present

## 2021-10-12 DIAGNOSIS — R809 Proteinuria, unspecified: Secondary | ICD-10-CM | POA: Diagnosis not present

## 2021-10-27 ENCOUNTER — Other Ambulatory Visit: Payer: Self-pay | Admitting: Cardiology

## 2021-11-24 ENCOUNTER — Other Ambulatory Visit: Payer: Self-pay | Admitting: Cardiology

## 2021-12-14 ENCOUNTER — Other Ambulatory Visit: Payer: Self-pay | Admitting: Cardiology

## 2021-12-16 NOTE — Progress Notes (Unsigned)
Kaitlin [name], [place] [address]   CLINIC:  Medical Oncology/Hematology  PCP:  Lindell Spar, MD [address] [place] [address] [phone]   REASON FOR VISIT:  Follow-up for macrocytic anemia  CURRENT THERAPY: Oral iron supplementation with IV iron PRN  INTERVAL HISTORY:  Kaitlin [name] 71 y.o. 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  She was last seen by Tarri Abernethy PA-C on 06/16/2021.  At today's visit, she reports feeling fairly well.  No recent hospitalizations, surgeries, or changes in baseline health status.  She felt unchanged after her IV iron in May 2023.  She has been taking ferrous sulfate once daily.  She denies any major bleeding events such as hematemesis, hematochezia, melena, or epistaxis.  She reports some chronic fatigue, with energy about 65%, and reports that she tires more easily than she used to.  She does note that she does not get enough sleep at night.  She denies any symptoms of fatigue, pica, restless legs, chest pain, dyspnea on exertion, lightheadedness, or syncope.  She has 65% energy and 85% appetite. She endorses that she is maintaining a stable weight.   REVIEW OF SYSTEMS:  Review of Systems  Constitutional:  Positive for fatigue. Negative for appetite change, chills, diaphoresis, fever and unexpected weight change.  HENT:   Negative for lump/mass and nosebleeds.   Eyes:  Negative for eye problems.  Respiratory:  Positive for cough and shortness of breath (COPD). Negative for hemoptysis.   Cardiovascular:  Negative for chest pain, leg swelling and palpitations.  Gastrointestinal:  Negative for abdominal pain, blood in stool, constipation, diarrhea, nausea and vomiting.  Genitourinary:  Negative for hematuria.   Musculoskeletal:  Positive for arthralgias.  Skin: Negative.   Neurological:  Negative for dizziness, headaches and  light-headedness.  Hematological:  Does not bruise/bleed easily.      PAST MEDICAL/SURGICAL HISTORY:  Past Medical History:  Diagnosis Date   AAA (abdominal aortic aneurysm) (HCC)    Arthritis    Asthma    CAD (coronary artery disease)    stents   Calculus of gallbladder without cholecystitis without obstruction    COPD (chronic obstructive pulmonary disease) (HCC)    Emphysema of lung (HCC)    GERD (gastroesophageal reflux disease)    Hyperlipidemia    Hypertension    Iliac artery stenosis, right (HCC)    60%-70%   Myocardial infarction ([place])    Normocytic anemia 01/26/2017   PAD (peripheral artery disease) ([place]) 12/14/2010   in the left vertebral, bilateral carotids   Past Surgical History:  Procedure Laterality Date   CARDIAC CATHETERIZATION  02/04/2005   A cypher 5.3 x 18 mm at 16 atmosphere of pressure in the mid LAD, this stent covered the proximal part of the LAD and also the mid LAD, this was then post dilated with 3.25 x 15 mm Quantum at 16 atmosphereof pressure for 45 seconds   CATARACT EXTRACTION Bilateral    CHOLECYSTECTOMY N/A 08/16/2017   Procedure: LAPAROSCOPIC CHOLECYSTECTOMY;  Surgeon: Aviva Signs, MD;  Location: [facility];  Service: General;  Laterality: N/A;   COLONOSCOPY N/A 03/01/2017   Procedure: COLONOSCOPY;  Surgeon: Danie Binder, MD;  Location: [facility];  Service: Endoscopy;  Laterality: N/A;  1:00pm   ESOPHAGOGASTRODUODENOSCOPY N/A 01/28/2017   Procedure: ESOPHAGOGASTRODUODENOSCOPY (EGD);  Surgeon: Jerene Bears, MD;  Location: [facility] END[facility]COPY;  Service: Gastroenterology;  Laterality: N/A;  GIVENS CAPSULE STUDY N/A 03/12/2017   Procedure: GIVENS CAPSULE STUDY;  Surgeon: Danie Binder, MD;  Location: [facility];  Service: Endoscopy;  Laterality: N/A;  7:30am   TUBAL LIGATION       SOCIAL HISTORY:  Social History   Socioeconomic History   Marital status: Married    Spouse name: Marcello Moores   Number of children: 3   Years of education: 14    Highest education level: Not on file  Occupational History   Occupation: RETIRED    Comment: warehouse/textiles  Tobacco Use   Smoking status: Every Day    Packs/day: 1.00    Years: 50.00    Total pack years: 50.00    Types: Cigarettes    Start date: 02/09/1967   Smokeless tobacco: Never  Vaping Use   Vaping Use: Never used  Substance and Sexual Activity   Alcohol use: No   Drug use: No   Sexual activity: Yes    Birth control/protection: Post-menopausal  Other Topics Concern   Not on file  Social History Narrative   Retired   Has an [place] in [employer] with husband Marcello Moores for 23 years   Stays busy with home, gardens, likes to can   Social Determinants of Health   Financial Resource Strain: [place]  (07/24/2021)   Overall Financial Resource Strain (CARDIA)    Difficulty of Paying Living Expenses: Not hard at all  Food Insecurity: No [place] (07/24/2021)   Hunger Vital Sign    Worried About Running Out of Food in the Last Year: Never true    [place] in the Last Year: Never true  Transportation Needs: No Transportation Needs (07/24/2021)   PRAPARE - Hydrologist (Medical): No    Lack of Transportation (Non-Medical): No  Physical Activity: Sufficiently Active (07/24/2021)   Exercise Vital Sign    Days of Exercise per Week: 7 days    Minutes of Exercise per Session: 40 min  Stress: No Stress Concern Present (07/24/2021)   [place]    Feeling of Stress : Not at all  Social Connections: Unknown (07/24/2021)   Social Connection and Isolation Panel [NHANES]    Frequency of Communication with Friends and Family: More than three times a week    Frequency of Social Gatherings with Friends and Family: Three times a week    Attends Religious Services: Never    Active Member of Clubs or Organizations: No    Attends Archivist Meetings: Never    Marital Status:  Patient refused  Intimate Partner Violence: Not At Risk (07/24/2021)   Humiliation, Afraid, Rape, and Kick questionnaire    Fear of Current or Ex-Partner: No    Emotionally Abused: No    Physically Abused: No    Sexually Abused: No    FAMILY HISTORY:  Family History  Problem Relation Age of Onset   Heart attack Mother    Hyperlipidemia Mother    Hypertension Mother    Diabetes Mother    Heart attack Father    Early death Father 10   Hyperlipidemia Father    Hypertension Father    Heart disease Sister    Hyperlipidemia Sister    Hypertension Sister    Diabetes Brother    Heart disease Sister    Hyperlipidemia Sister    Hypertension Sister    Heart attack Sister    Heart disease Sister  Hyperlipidemia Sister    Hypertension Sister    Heart attack Brother    Early death Brother 60   Hyperlipidemia Brother    Hypertension Brother    Heart attack Brother    Early death Brother 78   Hyperlipidemia Brother    Hypertension Brother    Cancer Maternal Aunt    Colon cancer Neg Hx    Colon polyps Neg Hx     CURRENT MEDICATIONS:  Outpatient Encounter Medications as of 12/18/2021  Medication Sig   albuterol (VENTOLIN HFA) 108 (90 Base) MCG/ACT inhaler Inhale 2 puffs into the lungs every 6 (six) hours as needed for wheezing or shortness of breath.   aspirin 81 MG chewable tablet Chew 81 mg by mouth daily.   cholecalciferol (VITAMIN D) 1000 units tablet Take 2 Units by mouth daily.    clopidogrel (PLAVIX) 75 MG tablet TAKE 1 TABLET BY MOUTH EVERY DAY   ferrous sulfate 325 (65 FE) MG tablet Take 325 mg by mouth daily with breakfast.   lisinopril (ZESTRIL) 2.5 MG tablet Take 1 tablet (2.5 mg total) by mouth daily.   NITROSTAT 0.4 MG SL tablet PLACE 1 TABLET UNDER TONGUE EVERY 5 MINUTES FOR 3 DOSES AS NEEDED.   Probiotic Product (PROBIOTIC-10 PO) Take 10 mg by mouth daily.   rosuvastatin (CRESTOR) 40 MG tablet Take 1 tablet (40 mg total) by mouth daily.   vitamin B-12  (CYANOCOBALAMIN) 500 MCG tablet Take 500 mcg by mouth daily.   No facility-administered encounter medications on file as of 12/18/2021.    ALLERGIES:  No Known Allergies   PHYSICAL EXAM:  ECOG PERFORMANCE STATUS: 1 - Symptomatic but completely ambulatory  There were no vitals filed for this visit. There were no vitals filed for this visit. Physical Exam Constitutional:      Appearance: Normal appearance.  HENT:     Head: Normocephalic and atraumatic.     Mouth/Throat:     Mouth: Mucous membranes are moist.  Eyes:     Extraocular Movements: Extraocular movements intact.     Pupils: Pupils are equal, round, and reactive to light.  Cardiovascular:     Rate and Rhythm: Normal rate and regular rhythm.     Pulses: Normal pulses.     Heart sounds: Normal heart sounds.  Pulmonary:     Effort: Pulmonary effort is normal.     Breath sounds: Decreased air movement present.  Abdominal:     General: Bowel sounds are normal.     Palpations: Abdomen is soft.     Tenderness: There is no abdominal tenderness.  Musculoskeletal:        General: No swelling.     Right lower leg: No edema.     Left lower leg: No edema.  Lymphadenopathy:     Cervical: No cervical adenopathy.  Skin:    General: Skin is warm and dry.  Neurological:     General: No focal deficit present.     Mental Status: She is alert and oriented to person, place, and time.  Psychiatric:        Mood and Affect: Mood normal.        Behavior: Behavior normal.      LABORATORY DATA:  I have reviewed the labs as listed.  CBC    Component Value Date/Time   WBC 4.3 06/09/2021 1007   RBC 3.93 06/09/2021 1007   HGB 12.7 06/09/2021 1007   HCT 38.4 06/09/2021 1007   PLT 168 06/09/2021 1007   MCV  97.7 06/09/2021 1007   MCH 32.3 06/09/2021 1007   MCHC 33.1 06/09/2021 1007   RDW 13.0 06/09/2021 1007   LYMPHSABS 1.4 06/09/2021 1007   MONOABS 0.3 06/09/2021 1007   EOSABS 0.2 06/09/2021 1007   BASOSABS 0.0 06/09/2021  1007      Latest Ref Rng & Units 06/09/2021   10:07 AM 12/09/2020    9:04 AM 10/17/2020    3:09 PM  CMP  Glucose 70 - 99 mg/dL 92  113  82   BUN 8 - 23 mg/dL '25  15  13   '$ Creatinine 0.44 - 1.00 mg/dL 1.79  1.48  1.65   Sodium 135 - 145 mmol/L 137  138  148   Potassium 3.5 - 5.1 mmol/L 3.9  3.7  3.9   Chloride 98 - 111 mmol/L 106  102  107   CO2 22 - 32 mmol/L '24  28  27   '$ Calcium 8.9 - 10.3 mg/dL 9.1  9.1  9.1   Total Protein 6.5 - 8.1 g/dL 7.6  7.4    Total Bilirubin 0.3 - 1.2 mg/dL 0.5  0.7    Alkaline Phos 38 - 126 U/L 101  115    AST 15 - 41 U/L 14  18    ALT 0 - 44 U/L 13  15      DIAGNOSTIC IMAGING:  I have independently reviewed the relevant imaging and discussed with the patient.  ASSESSMENT & PLAN: 1.  Macrocytic anemia  - Previously seen by hematology for normocytic anemia secondary to CKD, iron deficiency, and B12 deficiency.  - EGD (01/28/2017): Gastritis, normal duodenum - Colonoscopy (03/01/2017): Redundant colon, large rectal polyp removed, external hemorrhoids - Stool cards have not been checked at our office, patient reports that one of her other providers (Dr. Oneida Alar) checked them and they were negative. - She denies any signs or symptoms of blood loss.   - She takes ferrous sulfate daily at home.  She takes vitamin B12 at home.   -- Monoferric x 1 received on 06/22/2021, but without any improvement in her energy - No clear explanation of macrocytosis (resolved) - No known liver disease (liver US in 2018 was normal); folate, TSH, LDH, B12, methylmalonic acid, copper normal.  No reticulocytosis. - Most recent labs (12/18/2021): Hgb 11.7/MCV 100.8.  Mild leukopenia with WBC 3.9, normal differential.  Ferritin 64, iron saturation 20% - Differential diagnosis favors anemia related to CKD stage IIIb/IV; cause of mild macrocytosis unclear - PLAN: Continue ferrous sulfate daily.   - Repeat labs and RTC in 6 months   2.  Elevated serum free light chains - MGUS panel  (08/30/2020): SPEP and immunofixation normal, elevated light chains with kappa 66.9/lambda 32.3/ratio 2.15 - Repeat MGUS panel (06/09/2021): SPEP and immunofixation normal.  Elevated kappa light chain 69.8, elevated lambda light chain 30.4.  Free light chain ratio is slightly elevated at 2.30, but this would be considered within normal limits for patient with CKD.  LDH normal. - Elevated light chains likely secondary to CKD - PLAN: No further work-up at this time.  3.  CKD stage IIIb/IV - Patient has been referred to nephrology by her primary care provider  -Most recent CMP (12/18/2021) with baseline kidney function (creatinine 1.71/GFR 32) - PLAN: Continue follow-up with Dr. Theador Hawthorne   4.  Tobacco abuse - This patient meets criteria for low-dose CT lung cancer screening.  She has smoked 1 to 2 packs/day since age 58, currently smoking 1 pack/day but trying to  quit. - Shared decision making visit on 12/09/2020 - LDCT chest (02/10/2021): Lung RADS category 2S, benign appearance or behavior of lung nodules, but with aortic atherosclerosis and left main and three-vessel coronary artery disease - PLAN: Continue annual LDCT screening (next due January 2024).  Follow-up with PCP regarding other findings.    PLAN SUMMARY: >> LDCT chest January 2024 >> Labs in 6 months (CBC/D, CMP, ferritin, iron/TIBC) >> Office visit 1 week after labs   All questions were answered. The patient knows to call the clinic with any problems, questions or concerns.  Medical decision making: Low  Time spent on visit: I spent 15 minutes counseling the patient face to face. The total time spent in the appointment was 22 minutes and more than 50% was on counseling.   Harriett Rush, PA-C  12/18/21 12:01 PM

## 2021-12-18 ENCOUNTER — Inpatient Hospital Stay: Payer: Medicare Other | Attending: Physician Assistant | Admitting: Physician Assistant

## 2021-12-18 ENCOUNTER — Inpatient Hospital Stay: Payer: Medicare Other

## 2021-12-18 ENCOUNTER — Other Ambulatory Visit: Payer: Self-pay

## 2021-12-18 VITALS — BP 80/59 | HR 82 | Temp 97.3°F | Resp 18 | Ht 59.06 in | Wt 128.1 lb

## 2021-12-18 DIAGNOSIS — N184 Chronic kidney disease, stage 4 (severe): Secondary | ICD-10-CM | POA: Diagnosis not present

## 2021-12-18 DIAGNOSIS — E611 Iron deficiency: Secondary | ICD-10-CM

## 2021-12-18 DIAGNOSIS — F17210 Nicotine dependence, cigarettes, uncomplicated: Secondary | ICD-10-CM | POA: Diagnosis not present

## 2021-12-18 DIAGNOSIS — D539 Nutritional anemia, unspecified: Secondary | ICD-10-CM | POA: Diagnosis not present

## 2021-12-18 DIAGNOSIS — Z87891 Personal history of nicotine dependence: Secondary | ICD-10-CM

## 2021-12-18 DIAGNOSIS — D631 Anemia in chronic kidney disease: Secondary | ICD-10-CM | POA: Diagnosis not present

## 2021-12-18 DIAGNOSIS — D508 Other iron deficiency anemias: Secondary | ICD-10-CM

## 2021-12-18 DIAGNOSIS — R768 Other specified abnormal immunological findings in serum: Secondary | ICD-10-CM

## 2021-12-18 DIAGNOSIS — D649 Anemia, unspecified: Secondary | ICD-10-CM

## 2021-12-18 LAB — CBC WITH DIFFERENTIAL/PLATELET
Abs Immature Granulocytes: 0 10*3/uL (ref 0.00–0.07)
Basophils Absolute: 0 10*3/uL (ref 0.0–0.1)
Basophils Relative: 1 %
Eosinophils Absolute: 0.1 10*3/uL (ref 0.0–0.5)
Eosinophils Relative: 4 %
HCT: 36.3 % (ref 36.0–46.0)
Hemoglobin: 11.7 g/dL — ABNORMAL LOW (ref 12.0–15.0)
Immature Granulocytes: 0 %
Lymphocytes Relative: 28 %
Lymphs Abs: 1.1 10*3/uL (ref 0.7–4.0)
MCH: 32.5 pg (ref 26.0–34.0)
MCHC: 32.2 g/dL (ref 30.0–36.0)
MCV: 100.8 fL — ABNORMAL HIGH (ref 80.0–100.0)
Monocytes Absolute: 0.3 10*3/uL (ref 0.1–1.0)
Monocytes Relative: 6 %
Neutro Abs: 2.4 10*3/uL (ref 1.7–7.7)
Neutrophils Relative %: 61 %
Platelets: 161 10*3/uL (ref 150–400)
RBC: 3.6 MIL/uL — ABNORMAL LOW (ref 3.87–5.11)
RDW: 12.3 % (ref 11.5–15.5)
WBC: 3.9 10*3/uL — ABNORMAL LOW (ref 4.0–10.5)
nRBC: 0 % (ref 0.0–0.2)

## 2021-12-18 LAB — COMPREHENSIVE METABOLIC PANEL
ALT: 10 U/L (ref 0–44)
AST: 12 U/L — ABNORMAL LOW (ref 15–41)
Albumin: 4 g/dL (ref 3.5–5.0)
Alkaline Phosphatase: 90 U/L (ref 38–126)
Anion gap: 6 (ref 5–15)
BUN: 24 mg/dL — ABNORMAL HIGH (ref 8–23)
CO2: 26 mmol/L (ref 22–32)
Calcium: 9.1 mg/dL (ref 8.9–10.3)
Chloride: 109 mmol/L (ref 98–111)
Creatinine, Ser: 1.71 mg/dL — ABNORMAL HIGH (ref 0.44–1.00)
GFR, Estimated: 32 mL/min — ABNORMAL LOW (ref >60)
Glucose, Bld: 104 mg/dL — ABNORMAL HIGH (ref 70–99)
Potassium: 3.7 mmol/L (ref 3.5–5.1)
Sodium: 141 mmol/L (ref 135–145)
Total Bilirubin: 0.5 mg/dL (ref 0.3–1.2)
Total Protein: 7.4 g/dL (ref 6.5–8.1)

## 2021-12-18 LAB — IRON AND TIBC
Iron: 68 ug/dL (ref 28–170)
Saturation Ratios: 20 % (ref 10.4–31.8)
TIBC: 334 ug/dL (ref 250–450)
UIBC: 266 ug/dL

## 2021-12-18 LAB — FERRITIN: Ferritin: 64 ng/mL (ref 11–307)

## 2021-12-18 NOTE — Patient Instructions (Signed)
[place] at [facility] Discharge Instructions  You were seen today by Tarri Abernethy PA-C for your history of anemia.  Your blood and iron levels are stable.  We will recheck your labs in 6 months. - You are due for your annual CT scan of your chest in January 2024 - You should continue to take your iron tablet once daily at home. - We will see you for labs and follow-up visit again in 6 months.    LABS: Return in 6 months for repeat labs  MEDICATIONS: Continue iron tablets once daily  FOLLOW-UP APPOINTMENT: Office visit in 6 months, after labs   Thank you for choosing [place] at [facility] to provide your oncology and hematology care.  To afford each patient quality time with our provider, please arrive at least 15 minutes before your scheduled appointment time.   If you have a lab appointment with the [place] please come in thru the Main Entrance and check in at the main information desk.  You need to re-schedule your appointment should you arrive 10 or more minutes late.  We strive to give you quality time with our providers, and arriving late affects you and other patients whose appointments are after yours.  Also, if you no show three or more times for appointments you may be dismissed from the clinic at the providers discretion.     Again, thank you for choosing [facility].  Our hope is that these requests will decrease the amount of time that you wait before being seen by our physicians.       _____________________________________________________________  Should you have questions after your visit to [facility], please contact our office at [phone] and follow the prompts.  Our office hours are 8:00 a.m. and 4:30 p.m. Monday - Friday.  Please note that voicemails left after 4:00 p.m. may not be returned until the following business day.  We are closed weekends and major holidays.  You do  have access to a nurse 24-7, just call the main number to the clinic [phone] and do not press any options, hold on the line and a nurse will answer the phone.    For prescription refill requests, have your pharmacy contact our office and allow 72 hours.    Due to Covid, you will need to wear a mask upon entering the hospital. If you do not have a mask, a mask will be given to you at the Main Entrance upon arrival. For doctor visits, patients may have 1 support person age 72 or older with them. For treatment visits, patients can not have anyone with them due to social distancing guidelines and our immunocompromised population.

## 2022-01-17 ENCOUNTER — Telehealth: Payer: Self-pay | Admitting: Cardiology

## 2022-01-17 MED ORDER — CLOPIDOGREL BISULFATE 75 MG PO TABS: 75.0000 mg | ORAL_TABLET | Freq: Every day | ORAL | 0 refills | Status: DC

## 2022-01-17 NOTE — Telephone Encounter (Signed)
*  STAT* If patient is at the pharmacy, call can be transferred to refill team.   1. Which medications need to be refilled? (please list name of each medication and dose if known) clopidogrel (PLAVIX) 75 MG tablet   2. Which pharmacy/location (including street and city if local pharmacy) is medication to be sent to? [id number] [place], [place] - [address]  3. Do they need a 30 day or 90 day supply? [address]

## 2022-01-17 NOTE — Telephone Encounter (Signed)
Completed.

## 2022-01-30 ENCOUNTER — Telehealth: Payer: Self-pay | Admitting: Cardiology

## 2022-01-30 MED ORDER — CLOPIDOGREL BISULFATE 75 MG PO TABS: 75.0000 mg | ORAL_TABLET | Freq: Every day | ORAL | 1 refills | Status: DC

## 2022-01-30 NOTE — Telephone Encounter (Signed)
*  STAT* If patient is at the pharmacy, call can be transferred to refill team.   1. Which medications need to be refilled? (please list name of each medication and dose if known) clopidogrel (PLAVIX) 75 MG tablet   2. Which pharmacy/location (including street and city if local pharmacy) is medication to be sent to? [id number]- [place], [place] - [address]  3. Do they need a 30 day or 90 day supply?  90 day   Pt has scheduled appt on 06/07/22

## 2022-01-30 NOTE — Telephone Encounter (Signed)
Refilled as requested  

## 2022-02-01 ENCOUNTER — Ambulatory Visit: Payer: Medicare Other | Attending: Physician Assistant | Admitting: Physician Assistant

## 2022-02-01 ENCOUNTER — Encounter: Payer: Self-pay | Admitting: Physician Assistant

## 2022-02-01 VITALS — BP 136/84 | HR 80 | Ht 61.0 in | Wt 128.5 lb

## 2022-02-01 DIAGNOSIS — I10 Essential (primary) hypertension: Secondary | ICD-10-CM | POA: Diagnosis not present

## 2022-02-01 DIAGNOSIS — E782 Mixed hyperlipidemia: Secondary | ICD-10-CM | POA: Insufficient documentation

## 2022-02-01 DIAGNOSIS — I6523 Occlusion and stenosis of bilateral carotid arteries: Secondary | ICD-10-CM | POA: Insufficient documentation

## 2022-02-01 DIAGNOSIS — I714 Abdominal aortic aneurysm, without rupture, unspecified: Secondary | ICD-10-CM | POA: Diagnosis not present

## 2022-02-01 DIAGNOSIS — I2510 Atherosclerotic heart disease of native coronary artery without angina pectoris: Secondary | ICD-10-CM | POA: Diagnosis not present

## 2022-02-01 DIAGNOSIS — I739 Peripheral vascular disease, unspecified: Secondary | ICD-10-CM | POA: Insufficient documentation

## 2022-02-01 NOTE — Progress Notes (Signed)
Cardiology Office Note    Date:  02/01/2022   ID:  Kaitlin [name], DOB [birth year], MRN [id number]  PCP:  Lindell Spar, MD  Cardiologist:  Carlyle Dolly, MD  Electrophysiologist:  None   Chief Complaint: f/u CAD, PAD  History of Present Illness:   Kaitlin [name] is a 72 y.o. female with history of CAD s/p PCI 2007, PAD, AAA (3.4cm 06/2020), carotid stenosis with prior R CEA, HLD, anemia, CKD stage 3a, HTN, arthritis, asthma, COPD who is seen for follow-up. Dr. Nelly Laurence notes reviewed. She has h/o DES to LAD and BMSx2 to RCA Jan 2007, reported normal LV function from notes. 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 She was temporarily off ASA and plavix due to severe anemia requiring transfusion. She had GI workup that showed no clear source of bleeding. 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 Last carotid 03/2021 was stable without significant change to 2021 and last AAA duplex was in 06/2020, 3.4cm, with recommendation for recheck 06/2023. She is also known to have PAD with reported 60-70% right external iliac artery stenosis by 2007 cath. She has not required further testing as she has not had claudication. Given her history of complex PCI, carotid and peripheral artery disease, she has been maintained on long term DAPT.  She returns for follow-up today doing well without complaint. She has not had any recent CP, SOB, palpitations, edema, syncope or bleeding. No claudication. Her SBP is mildly elevated today. She does not check this at home regularly. The last reading before this one was 80 systolic when seen in heme-onc visit in 11/2021 - she reports she was under a lot of stress at the time.   Labwork independently reviewed: 11/2021 K 3.7, CR 1.71, Hgb 11.7, plt 161, LFTs ok 08/2020 TSH 07/2019 LDL 42, trig 257  Cardiology Studies:   Studies reviewed are outlined and  summarized above. Reports may be included below. Otherwise see EMR.  Carotid US 03/2021  IMPRESSION: 1. Carotid artery duplex findings are stable since 2021. Atherosclerotic plaque involving bilateral carotid arteries without significant change in the velocities. Peak systolic velocities in the internal carotid arteries are slightly elevated but the carotid artery ratios are less than 2. Estimated degree of stenosis in the internal carotid arteries is less than 50% bilaterally. 2. Patent vertebral arteries with antegrade flow.     Electronically Signed   By: Kaitlin [name] M.D.   On: 04/26/2021 18:35  06/2020 US AORTA IMPRESSION: Probable slight increase in AP diameter of the distal abdominal aortic aneurysm from 3.2 cm in 2022 3.4 cm currently. Recommend follow-up ultrasound every 3 years. This recommendation follows ACR consensus guidelines: White Paper of the ACR Incidental Findings Committee II on Vascular Findings. J Am Coll Radiol 2013; 10:789-794.     Electronically Signed   By: Kaitlin [name] M.D.   On: 07/23/2020 09:13    Past Medical History:  Diagnosis Date   AAA (abdominal aortic aneurysm) (HCC)    Anemia    Arthritis    Asthma    CAD (coronary artery disease)    stents   Calculus of gallbladder without cholecystitis without obstruction    Carotid artery disease (HCC)    Chronic kidney disease, stage 3a (HCC)    COPD (chronic obstructive pulmonary disease) (HCC)    Emphysema of lung (HCC)    GERD (gastroesophageal reflux disease)    Hyperlipidemia  Hypertension    Iliac artery stenosis, right (HCC)    60%-70%   Myocardial infarction ([place])    Normocytic anemia 01/26/2017   PAD (peripheral artery disease) ([place]) 12/14/2010   in the left vertebral, bilateral carotids    Past Surgical History:  Procedure Laterality Date   CARDIAC CATHETERIZATION  02/04/2005   A cypher 5.3 x 18 mm at 16 atmosphere of pressure in the mid LAD, this stent covered the  proximal part of the LAD and also the mid LAD, this was then post dilated with 3.25 x 15 mm Quantum at 16 atmosphereof pressure for 45 seconds   CATARACT EXTRACTION Bilateral    CHOLECYSTECTOMY N/A 08/16/2017   Procedure: LAPAROSCOPIC CHOLECYSTECTOMY;  Surgeon: Kaitlin Signs, MD;  Location: [facility];  Service: General;  Laterality: N/A;   COLONOSCOPY N/A 03/01/2017   Procedure: COLONOSCOPY;  Surgeon: Kaitlin Binder, MD;  Location: [facility];  Service: Endoscopy;  Laterality: N/A;  1:00pm   ESOPHAGOGASTRODUODENOSCOPY N/A 01/28/2017   Procedure: ESOPHAGOGASTRODUODENOSCOPY (EGD);  Surgeon: Kaitlin Bears, MD;  Location: [facility] ENDOSCOPY;  Service: Gastroenterology;  Laterality: N/A;   GIVENS CAPSULE STUDY N/A 03/12/2017   Procedure: GIVENS CAPSULE STUDY;  Surgeon: Kaitlin Binder, MD;  Location: [facility];  Service: Endoscopy;  Laterality: N/A;  7:30am   TUBAL LIGATION      Current Medications: Current Meds  Medication Sig   albuterol (VENTOLIN HFA) 108 (90 Base) MCG/ACT inhaler Inhale 2 puffs into the lungs every 6 (six) hours as needed for wheezing or shortness of breath.   aspirin 81 MG chewable tablet Chew 81 mg by mouth daily.   cholecalciferol (VITAMIN D) 1000 units tablet Take 2 Units by mouth daily.    clopidogrel (PLAVIX) 75 MG tablet Take 1 tablet (75 mg total) by mouth daily.   ferrous sulfate 325 (65 FE) MG tablet Take 325 mg by mouth daily with breakfast.   lisinopril (ZESTRIL) 2.5 MG tablet Take 1 tablet (2.5 mg total) by mouth daily.   NITROSTAT 0.4 MG SL tablet PLACE 1 TABLET UNDER TONGUE EVERY 5 MINUTES FOR 3 DOSES AS NEEDED.   Probiotic Product (PROBIOTIC-10 PO) Take 10 mg by mouth daily.   rosuvastatin (CRESTOR) 40 MG tablet Take 1 tablet (40 mg total) by mouth daily.   vitamin B-12 (CYANOCOBALAMIN) 500 MCG tablet Take 500 mcg by mouth daily.   Allergies:   Patient has no known allergies.   Social History   Socioeconomic History   Marital status: Married    Spouse name:  Kaitlin [name]   Number of children: 3   Years of education: 14   Highest education level: Not on file  Occupational History   Occupation: RETIRED    Comment: warehouse/textiles  Tobacco Use   Smoking status: Every Day    Packs/day: 1.00    Years: 50.00    Total pack years: 50.00    Types: Cigarettes    Start date: 02/09/1967   Smokeless tobacco: Never  Vaping Use   Vaping Use: Never used  Substance and Sexual Activity   Alcohol use: No   Drug use: No   Sexual activity: Yes    Birth control/protection: Post-menopausal  Other Topics Concern   Not on file  Social History Narrative   Retired   Has an [place] in [employer] with husband Kaitlin [name] for 23 years   Stays busy with home, gardens, likes to can   Social Determinants of Radio broadcast assistant Strain: [place]  (  07/24/2021)   Overall Financial Resource Strain (CARDIA)    Difficulty of Paying Living Expenses: Not hard at all  Food Insecurity: No Food Insecurity (07/24/2021)   Hunger Vital Sign    Worried About Running Out of Food in the Last Year: Never true    Ran Out of Food in the Last Year: Never true  Transportation Needs: No Transportation Needs (07/24/2021)   PRAPARE - Hydrologist (Medical): No    Lack of Transportation (Non-Medical): No  Physical Activity: Sufficiently Active (07/24/2021)   Exercise Vital Sign    Days of Exercise per Week: 7 days    Minutes of Exercise per Session: 40 min  Stress: No Stress Concern Present (07/24/2021)   [place]    Feeling of Stress : Not at all  Social Connections: Unknown (07/24/2021)   Social Connection and Isolation Panel [NHANES]    Frequency of Communication with Friends and Family: More than three times a week    Frequency of Social Gatherings with Friends and Family: Three times a week    Attends Religious Services: Never    Active Member of Clubs or Organizations: No    Attends  Archivist Meetings: Never    Marital Status: Patient refused     Family History:  The patient's family history includes Cancer in her maternal aunt; Diabetes in her brother and mother; Early death (age of onset: 34) in her brother; Early death (age of onset: 7) in her father; Early death (age of onset: 78) in her brother; Heart attack in her brother, brother, father, mother, and sister; Heart disease in her sister, sister, and sister; Hyperlipidemia in her brother, brother, father, mother, sister, sister, and sister; Hypertension in her brother, brother, father, mother, sister, sister, and sister. There is no history of Colon cancer or Colon polyps.  ROS:   Please see the history of present illness. All other systems are reviewed and otherwise negative.    EKG(s)/Additional Labs   EKG:  EKG is ordered today, personally reviewed, demonstrating NSR 80bpm, no acute STT changes.  Recent Labs: 12/18/2021: ALT 10; BUN 24; Creatinine, Ser 1.71; Hemoglobin 11.7; Platelets 161; Potassium 3.7; Sodium 141  Recent Lipid Panel    Component Value Date/Time   CHOL 112 08/13/2019 1307   TRIG 257 (H) 08/13/2019 1307   HDL 40 (L) 08/13/2019 1307   CHOLHDL 2.8 08/13/2019 1307   VLDL 37 04/09/2014 1027   LDLCALC 42 08/13/2019 1307    PHYSICAL EXAM:    VS:  BP 136/84   Pulse 80   Ht '5\' 1"'$  (1.549 m)   Wt 128 lb 8 oz (58.3 kg)   SpO2 98%   BMI 24.28 kg/m   BMI: Body mass index is 24.28 kg/m.  GEN: Well nourished, well developed female in no acute distress HEENT: normocephalic, atraumatic Neck: no JVD or masses. Faint right carotid bruit Cardiac: RRR; no murmurs, rubs, or gallops, no edema  Respiratory:  clear to auscultation bilaterally, normal work of breathing GI: soft, nontender, nondistended, + BS MS: no deformity or atrophy Skin: warm and dry, no rash Neuro:  Alert and Oriented x 3, Strength and sensation are intact, follows commands Psych: euthymic mood, full affect  Wt  Readings from Last 3 Encounters:  02/01/22 128 lb 8 oz (58.3 kg)  12/18/21 128 lb 1.4 oz (58.1 kg)  08/17/21 125 lb 3.2 oz (56.8 kg)     ASSESSMENT &  PLAN:   1. CAD, HLD - doing well without recent angina. Maintained on long term DAPT as above, will continue. No recent bleeding reported. Anemia is being followed by heme-onc. Metoprolol previously stopped due to hypotension per notes. Remains on rosuvastatin '40mg'$  daily. She is due for lipids, has not been checked in primary care since 2021. Since our office has been rx'ing her statin, will recheck CMET/lipid profile fasting in our office at her next convenience.  2. Essential HTN - Suboptimal blood pressure control noted today. We need a better idea of what it's running at home. The patient was provided instructions on monitoring blood pressure at home for 1 week and relaying results to our office. Would consider resumption of low dose BB if indicated.  3. PAD with LE PAD and AAA - no recent claudication therefore continue to follow PAD clinically. Continue statin. Discussed aneurysm precautions today, outlined on AVS (lifting, avoiding fluoroquinolones, for example). Follow BP as above. AAA duplex will be due in 06/2023, can order closer to that time.  4. Carotid artery disease - repeat carotid study 03/2022. Continue statin.     Disposition: F/u with Dr. Harl Bowie in 6 months.   Medication Adjustments/Labs and Tests Ordered: Current medicines are reviewed at length with the patient today.  Concerns regarding medicines are outlined above. Medication changes, Labs and Tests ordered today are summarized above and listed in the Patient Instructions accessible in Encounters.   Signed, Charlie Pitter, PA-C  02/01/2022 3:04 PM    [place] Phone: [phone]; Fax: [phone]

## 2022-02-01 NOTE — Patient Instructions (Addendum)
Medication Instructions:  Your physician recommends that you continue on your current medications as directed. Please refer to the Current Medication list given to you today.   Labwork: Fasting Lipid Panel Fasting CMET  Testing/Procedures: Your physician has requested that you have a carotid duplex. This test is an ultrasound of the carotid arteries in your neck. It looks at blood flow through these arteries that supply the brain with blood. Allow one hour for this exam. There are no restrictions or special instructions.   Follow-Up: Follow up with Dr. Harl Bowie in 6 months.   Any Other Special Instructions Will Be Listed Below (If Applicable).     If you need a refill on your cardiac medications before your next appointment, please call your pharmacy.    We need to get a better idea of what your blood pressure is running at home. Here are some instructions to follow: - I would recommend using a blood pressure cuff that goes on your arm. The wrist ones can be inaccurate. If you're purchasing one for the first time, try to select one that also reports your heart rate because this can be helpful information as well. - To check your blood pressure, choose a time at least 3 hours after taking your blood pressure medicines. If you can sample it at different times of the day, that's great - it might give you more information about how your blood pressure fluctuates. Remain seated in a chair for 5 minutes quietly beforehand, then check it.  - Please record a list of those readings and call us/send in MyChart message with them for our review in 1-2 weeks. In general we recommend to let us know if you routinely see blood pressure readings 130 on the top number or higher.    Information About Your Aneurysm  One of your tests has shown an aneurysm of your aorta. The word "aneurysm" refers to a bulge in an artery (blood vessel). Most people think of them in the context of an emergency, but yours  was found incidentally. At this point there is nothing you need to do from a procedure standpoint, but there are some important things to keep in mind for day-to-day life.  Mainstays of therapy for aneurysms include very good blood pressure control, healthy lifestyle, and avoiding tobacco products and street drugs. Research has raised concern that antibiotics in the fluoroquinolone class could be associated with increased risk of having an aneurysm develop or tear. This includes medicines that end in "floxacin," like Cipro or Levaquin. Make sure to discuss this information with other healthcare providers if you require antibiotics.  Since aneurysms can run in families, you should discuss your diagnosis with first degree relatives as they may need to be screened for this. Regular mild-moderate physical exercise is important, but avoid heavy lifting/weight lifting over 30lbs, chopping wood, shoveling snow or digging heavy earth with a shovel. It is best to avoid activities that cause grunting or straining (medically referred to as a "Valsalva maneuver"). This happens when a person bears down against a closed throat to increase the strength of arm or abdominal muscles. There's often a tendency to do this when lifting heavy weights, doing sit-ups, push-ups or chin-ups, etc., but it may be harmful.  This is a finding I would expect to be monitored periodically by your cardiology team. Most unruptured thoracic aortic aneurysms cause no symptoms, so they are often found during exams for other conditions. Contact a health care provider if you develop any discomfort  in your upper back, neck, abdomen, trouble swallowing, cough or hoarseness, or unexplained weight loss. Get help right away if you develop severe pain in your upper back or abdomen that may move into your chest and arms, or any other concerning symptoms such as shortness of breath or fever.

## 2022-02-09 ENCOUNTER — Other Ambulatory Visit (HOSPITAL_COMMUNITY)
Admission: RE | Admit: 2022-02-09 | Discharge: 2022-02-09 | Disposition: A | Discharge status: Home / Self Care | Payer: Medicare Other | Source: Ambulatory Visit | Attending: Physician Assistant | Admitting: Physician Assistant

## 2022-02-09 DIAGNOSIS — E782 Mixed hyperlipidemia: Secondary | ICD-10-CM | POA: Diagnosis not present

## 2022-02-09 LAB — COMPREHENSIVE METABOLIC PANEL
ALT: 9 U/L (ref 0–44)
AST: 12 U/L — ABNORMAL LOW (ref 15–41)
Albumin: 4 g/dL (ref 3.5–5.0)
Alkaline Phosphatase: 87 U/L (ref 38–126)
Anion gap: 9 (ref 5–15)
BUN: 25 mg/dL — ABNORMAL HIGH (ref 8–23)
CO2: 26 mmol/L (ref 22–32)
Calcium: 8.9 mg/dL (ref 8.9–10.3)
Chloride: 103 mmol/L (ref 98–111)
Creatinine, Ser: 1.7 mg/dL — ABNORMAL HIGH (ref 0.44–1.00)
GFR, Estimated: 32 mL/min — ABNORMAL LOW (ref >60)
Glucose, Bld: 95 mg/dL (ref 70–99)
Potassium: 3.7 mmol/L (ref 3.5–5.1)
Sodium: 138 mmol/L (ref 135–145)
Total Bilirubin: 0.4 mg/dL (ref 0.3–1.2)
Total Protein: 7.2 g/dL (ref 6.5–8.1)

## 2022-02-09 LAB — LIPID PANEL
Cholesterol: 137 mg/dL (ref 0–200)
HDL: 43 mg/dL (ref >40)
LDL Cholesterol: 58 mg/dL (ref 0–99)
Total CHOL/HDL Ratio: 3.2 RATIO
Triglycerides: 178 mg/dL — ABNORMAL HIGH (ref <150)
VLDL: 36 mg/dL (ref 0–40)

## 2022-02-19 ENCOUNTER — Encounter: Payer: Self-pay | Admitting: Internal Medicine

## 2022-02-19 ENCOUNTER — Ambulatory Visit (INDEPENDENT_AMBULATORY_CARE_PROVIDER_SITE_OTHER): Payer: Medicare Other | Admitting: Internal Medicine

## 2022-02-19 VITALS — BP 115/65 | HR 107 | Ht 61.0 in | Wt 128.0 lb

## 2022-02-19 DIAGNOSIS — E782 Mixed hyperlipidemia: Secondary | ICD-10-CM

## 2022-02-19 DIAGNOSIS — I2510 Atherosclerotic heart disease of native coronary artery without angina pectoris: Secondary | ICD-10-CM

## 2022-02-19 DIAGNOSIS — J432 Centrilobular emphysema: Secondary | ICD-10-CM | POA: Diagnosis not present

## 2022-02-19 DIAGNOSIS — I700 Atherosclerosis of aorta: Secondary | ICD-10-CM

## 2022-02-19 DIAGNOSIS — I739 Peripheral vascular disease, unspecified: Secondary | ICD-10-CM | POA: Diagnosis not present

## 2022-02-19 DIAGNOSIS — Z2821 Immunization not carried out because of patient refusal: Secondary | ICD-10-CM

## 2022-02-19 DIAGNOSIS — Z720 Tobacco use: Secondary | ICD-10-CM

## 2022-02-19 DIAGNOSIS — I714 Abdominal aortic aneurysm, without rupture, unspecified: Secondary | ICD-10-CM

## 2022-02-19 DIAGNOSIS — N1832 Chronic kidney disease, stage 3b: Secondary | ICD-10-CM | POA: Diagnosis not present

## 2022-02-19 DIAGNOSIS — F17210 Nicotine dependence, cigarettes, uncomplicated: Secondary | ICD-10-CM | POA: Diagnosis not present

## 2022-02-19 MED ORDER — ATORVASTATIN CALCIUM 80 MG PO TABS: 80.0000 mg | ORAL_TABLET | Freq: Every day | ORAL | 3 refills | Status: DC

## 2022-02-19 MED ORDER — ALBUTEROL SULFATE HFA 108 (90 BASE) MCG/ACT IN AERS: 2.0000 | INHALATION_SPRAY | Freq: Four times a day (QID) | RESPIRATORY_TRACT | 5 refills | Status: DC | PRN

## 2022-02-19 NOTE — Assessment & Plan Note (Signed)
No current leg claudication symptoms Followed by Cardiology On Aspirin, Plavix and statin

## 2022-02-19 NOTE — Assessment & Plan Note (Signed)
Smokes about 0.75 pack per day  Asked about quitting: confirms that she currently smokes cigarettes Advise to quit smoking: Educated about QUITTING to reduce the risk of cancer, cardio and cerebrovascular disease. Assess willingness: Unwilling to quit at this time, but is working on cutting back. Assist with counseling and pharmacotherapy: Counseled for 5 minutes and literature provided. Arrange for follow up: Follow up in 3 months and continue to offer help.

## 2022-02-19 NOTE — Assessment & Plan Note (Signed)
Last BMP reviewed, GFR ranges around 35 now Followed by nephrology - last visit note reviewed On Lisinopril 2.5 mg now Needs to improve fluid intake, avoid soft drinks Avoid nephrotoxic agents including NSAIDs

## 2022-02-19 NOTE — Assessment & Plan Note (Signed)
Noted on CT chest On DAPT and statin for history of PAD and CAD

## 2022-02-19 NOTE — Patient Instructions (Signed)
Please start taking Atorvastatin once you complete Rosuvastatin.  Please continue taking other medications as prescribed.  Please continue to follow low salt diet and perform moderate exercise/walking at least 150 mins/week.

## 2022-02-19 NOTE — Progress Notes (Signed)
Established Patient Office Visit  Subjective:  Patient ID: Kaitlin [name], female    DOB: 16-Feb-1950  Age: 72 y.o. MRN: [id number]  CC:  Chief Complaint  Patient presents with   COPD    Patient following up on her COPD, CKD and CAD    HPI Kaitlin [name] is a 71 y.o. female with past medical history of CAD s/p stent placement, PAD, AAA, carotid stenosis s/p right carotid and arterectomy, HLD, COPD, CKD stage IIIb and tobacco abuse who presents for f/u of her chronic medical conditions.  She sees cardiologist for history of CAD, PAD, AAA and carotid stenosis.  She is on aspirin, Plavix and statin.  She denies any chest pain, dyspnea, LE swelling or leg claudication.  She has seen Dr. Theador Hawthorne for CKD stage IIIb.  Last BMP from chart has been reviewed and discussed with the patient.  She admits that she needs to improve fluid intake as she has only coffee (3 cups) as fluid intake in a day. She denies any dysuria or hematuria currently.  Denies any urinary hesitance or resistance.  She has used albuterol occasionally for dyspnea.  She has history of COPD and has chronic cough.  She denies any fever, chills or wheezing currently.     Past Medical History:  Diagnosis Date   AAA (abdominal aortic aneurysm) (HCC)    Anemia    Arthritis    Asthma    CAD (coronary artery disease)    stents   Calculus of gallbladder without cholecystitis without obstruction    Carotid artery disease (HCC)    Chronic kidney disease, stage 3a (HCC)    COPD (chronic obstructive pulmonary disease) (HCC)    Emphysema of lung (HCC)    GERD (gastroesophageal reflux disease)    Hyperlipidemia    Hypertension    Iliac artery stenosis, right (HCC)    60%-70%   Myocardial infarction ([place])    Normocytic anemia 01/26/2017   PAD (peripheral artery disease) ([place]) 12/14/2010   in the left vertebral, bilateral carotids    Past Surgical History:  Procedure Laterality Date   CARDIAC CATHETERIZATION  02/04/2005    A cypher 5.3 x 18 mm at 16 atmosphere of pressure in the mid LAD, this stent covered the proximal part of the LAD and also the mid LAD, this was then post dilated with 3.25 x 15 mm Quantum at 16 atmosphereof pressure for 45 seconds   CATARACT EXTRACTION Bilateral    CHOLECYSTECTOMY N/A 08/16/2017   Procedure: LAPAROSCOPIC CHOLECYSTECTOMY;  Surgeon: Aviva Signs, MD;  Location: [facility];  Service: General;  Laterality: N/A;   COLONOSCOPY N/A 03/01/2017   Procedure: COLONOSCOPY;  Surgeon: Danie Binder, MD;  Location: [facility];  Service: Endoscopy;  Laterality: N/A;  1:00pm   ESOPHAGOGASTRODUODENOSCOPY N/A 01/28/2017   Procedure: ESOPHAGOGASTRODUODENOSCOPY (EGD);  Surgeon: Jerene Bears, MD;  Location: [facility] ENDOSCOPY;  Service: Gastroenterology;  Laterality: N/A;   GIVENS CAPSULE STUDY N/A 03/12/2017   Procedure: GIVENS CAPSULE STUDY;  Surgeon: Danie Binder, MD;  Location: [facility];  Service: Endoscopy;  Laterality: N/A;  7:30am   TUBAL LIGATION      Family History  Problem Relation Age of Onset   Heart attack Mother    Hyperlipidemia Mother    Hypertension Mother    Diabetes Mother    Heart attack Father    Early death Father 77   Hyperlipidemia Father    Hypertension Father    Heart disease Sister  Hyperlipidemia Sister    Hypertension Sister    Diabetes Brother    Heart disease Sister    Hyperlipidemia Sister    Hypertension Sister    Heart attack Sister    Heart disease Sister    Hyperlipidemia Sister    Hypertension Sister    Heart attack Brother    Early death Brother 83   Hyperlipidemia Brother    Hypertension Brother    Heart attack Brother    Early death Brother 70   Hyperlipidemia Brother    Hypertension Brother    Cancer Maternal Aunt    Colon cancer Neg Hx    Colon polyps Neg Hx     Social History   Socioeconomic History   Marital status: Married    Spouse name: Marcello Moores   Number of children: 3   Years of education: 14   Highest education  level: Not on file  Occupational History   Occupation: RETIRED    Comment: warehouse/textiles  Tobacco Use   Smoking status: Every Day    Packs/day: 1.00    Years: 50.00    Total pack years: 50.00    Types: Cigarettes    Start date: 02/09/1967   Smokeless tobacco: Never  Vaping Use   Vaping Use: Never used  Substance and Sexual Activity   Alcohol use: No   Drug use: No   Sexual activity: Yes    Birth control/protection: Post-menopausal  Other Topics Concern   Not on file  Social History Narrative   Retired   Has an [place] in [employer] with husband Marcello Moores for 23 years   Stays busy with home, gardens, likes to can   Social Determinants of Health   Financial Resource Strain: [place]  (07/24/2021)   Overall Financial Resource Strain (CARDIA)    Difficulty of Paying Living Expenses: Not hard at all  Food Insecurity: No [place] (07/24/2021)   Hunger Vital Sign    Worried About Running Out of Food in the Last Year: Never true    [place] in the Last Year: Never true  Transportation Needs: No Transportation Needs (07/24/2021)   PRAPARE - Hydrologist (Medical): No    Lack of Transportation (Non-Medical): No  Physical Activity: Sufficiently Active (07/24/2021)   Exercise Vital Sign    Days of Exercise per Week: 7 days    Minutes of Exercise per Session: 40 min  Stress: No Stress Concern Present (07/24/2021)   [facility]    Feeling of Stress : Not at all  Social Connections: Unknown (07/24/2021)   Social Connection and Isolation Panel [NHANES]    Frequency of Communication with Friends and Family: More than three times a week    Frequency of Social Gatherings with Friends and Family: Three times a week    Attends Religious Services: Never    Active Member of Clubs or Organizations: No    Attends Archivist Meetings: Never    Marital Status: Patient refused   Intimate Partner Violence: Not At Risk (07/24/2021)   Humiliation, Afraid, Rape, and Kick questionnaire    Fear of Current or Ex-Partner: No    Emotionally Abused: No    Physically Abused: No    Sexually Abused: No    Outpatient Medications Prior to Visit  Medication Sig Dispense Refill   aspirin 81 MG chewable tablet Chew 81 mg by mouth daily.     cholecalciferol (  VITAMIN D) 1000 units tablet Take 2 Units by mouth daily.      clopidogrel (PLAVIX) 75 MG tablet Take 1 tablet (75 mg total) by mouth daily. 90 tablet 1   ferrous sulfate 325 (65 FE) MG tablet Take 325 mg by mouth daily with breakfast.     lisinopril (ZESTRIL) 2.5 MG tablet Take 1 tablet (2.5 mg total) by mouth daily. 90 tablet 3   NITROSTAT 0.4 MG SL tablet PLACE 1 TABLET UNDER TONGUE EVERY 5 MINUTES FOR 3 DOSES AS NEEDED. 25 tablet 3   Probiotic Product (PROBIOTIC-10 PO) Take 10 mg by mouth daily.     vitamin B-12 (CYANOCOBALAMIN) 500 MCG tablet Take 500 mcg by mouth daily.     albuterol (VENTOLIN HFA) 108 (90 Base) MCG/ACT inhaler Inhale 2 puffs into the lungs every 6 (six) hours as needed for wheezing or shortness of breath. 8 g 2   rosuvastatin (CRESTOR) 40 MG tablet Take 1 tablet (40 mg total) by mouth daily. 90 tablet 3   No facility-administered medications prior to visit.    No Known Allergies  ROS Review of Systems  Constitutional:  Negative for chills and fever.  HENT:  Negative for congestion, sinus pressure, sinus pain and sore throat.   Eyes:  Negative for pain and discharge.  Respiratory:  Positive for cough. Negative for shortness of breath.   Cardiovascular:  Negative for chest pain and palpitations.  Gastrointestinal:  Negative for abdominal pain, constipation, diarrhea, nausea and vomiting.  Endocrine: Negative for polydipsia and polyuria.  Genitourinary:  Negative for dysuria and hematuria.  Musculoskeletal:  Negative for neck pain and neck stiffness.  Skin:  Negative for rash.  Neurological:   Negative for dizziness and weakness.  Psychiatric/Behavioral:  Negative for agitation and behavioral problems.       Objective:    Physical Exam Vitals reviewed.  Constitutional:      General: She is not in acute distress.    Appearance: She is not diaphoretic.  HENT:     Head: Normocephalic and atraumatic.     Nose: Nose normal.     Mouth/Throat:     Mouth: Mucous membranes are moist.  Eyes:     General: No scleral icterus.    Extraocular Movements: Extraocular movements intact.  Cardiovascular:     Rate and Rhythm: Normal rate and regular rhythm.     Pulses: Normal pulses.     Heart sounds: Normal heart sounds. No murmur heard. Pulmonary:     Breath sounds: Normal breath sounds. No wheezing or rales.  Musculoskeletal:     Cervical back: Neck supple. No tenderness.     Right lower leg: No edema.     Left lower leg: No edema.  Skin:    General: Skin is warm.     Findings: No rash.  Neurological:     General: No focal deficit present.     Mental Status: She is alert and oriented to person, place, and time.     Sensory: No sensory deficit.     Motor: No weakness.  Psychiatric:        Mood and Affect: Mood normal.        Behavior: Behavior normal.     BP 115/65 (BP Location: Left Arm, Patient Position: Sitting, Cuff Size: Normal)   Pulse (!) 107   Ht '5\' 1"'$  (1.549 m)   Wt 128 lb (58.1 kg)   SpO2 94%   BMI 24.19 kg/m  Wt Readings from Last 3 Encounters:  02/19/22 128 lb (58.1 kg)  02/01/22 128 lb 8 oz (58.3 kg)  12/18/21 128 lb 1.4 oz (58.1 kg)    Lab Results  Component Value Date   TSH 0.608 08/30/2020   Lab Results  Component Value Date   WBC 3.9 (L) 12/18/2021   HGB 11.7 (L) 12/18/2021   HCT 36.3 12/18/2021   MCV 100.8 (H) 12/18/2021   PLT 161 12/18/2021   Lab Results  Component Value Date   NA 138 02/09/2022   K 3.7 02/09/2022   CO2 26 02/09/2022   GLUCOSE 95 02/09/2022   BUN 25 (H) 02/09/2022   CREATININE 1.70 (H) 02/09/2022   BILITOT 0.4  02/09/2022   ALKPHOS 87 02/09/2022   AST 12 (L) 02/09/2022   ALT 9 02/09/2022   PROT 7.2 02/09/2022   ALBUMIN 4.0 02/09/2022   CALCIUM 8.9 02/09/2022   ANIONGAP 9 02/09/2022   EGFR 34 (L) 10/17/2020   Lab Results  Component Value Date   CHOL 137 02/09/2022   Lab Results  Component Value Date   HDL 43 02/09/2022   Lab Results  Component Value Date   LDLCALC 58 02/09/2022   Lab Results  Component Value Date   TRIG 178 (H) 02/09/2022   Lab Results  Component Value Date   CHOLHDL 3.2 02/09/2022   Lab Results  Component Value Date   HGBA1C 5.2 01/26/2017      Assessment & Plan:   Problem List Items Addressed This Visit       Cardiovascular and Mediastinum   CAD (coronary artery disease)    S/p stent placement Followed by Cardiology On Aspirin, Plavix and statin      Relevant Medications   atorvastatin (LIPITOR) 80 MG tablet (Start on 04/19/2022)   PAD (peripheral artery disease) (HCC)    No current leg claudication symptoms Followed by Cardiology On Aspirin, Plavix and statin      Relevant Medications   atorvastatin (LIPITOR) 80 MG tablet (Start on 04/19/2022)   AAA (abdominal aortic aneurysm) (HCC) - Primary    Stable in size, last US abdomen reviewed Next US abdomen for AAA follow-up in 2 years      Relevant Medications   atorvastatin (LIPITOR) 80 MG tablet (Start on 04/19/2022)   Atherosclerosis of aorta (HCC)    Noted on CT chest On DAPT and statin for history of PAD and CAD      Relevant Medications   atorvastatin (LIPITOR) 80 MG tablet (Start on 04/19/2022)     Respiratory   Emphysema lung (HCC)    Uses albuterol as needed Well controlled currently      Relevant Medications   albuterol (VENTOLIN HFA) 108 (90 Base) MCG/ACT inhaler     Genitourinary   Stage 3b chronic kidney disease ([place])    Last BMP reviewed, GFR ranges around 35 now Followed by nephrology - last visit note reviewed On Lisinopril 2.5 mg now Needs to improve fluid  intake, avoid soft drinks Avoid nephrotoxic agents including NSAIDs        Other   Hyperlipidemia    On Crestor Switched to Lipitor from Crestor as her GFR is close to 30 now and has proteinuria      Relevant Medications   atorvastatin (LIPITOR) 80 MG tablet (Start on 04/19/2022)   Tobacco abuse    Smokes about 0.75 pack per day  Asked about quitting: confirms that she currently smokes cigarettes Advise to quit smoking: Educated about QUITTING to reduce the risk of cancer, cardio and  cerebrovascular disease. Assess willingness: Unwilling to quit at this time, but is working on cutting back. Assist with counseling and pharmacotherapy: Counseled for 5 minutes and literature provided. Arrange for follow up: Follow up in 3 months and continue to offer help.      Other Visit Diagnoses     Refused influenza vaccine           Meds ordered this encounter  Medications   atorvastatin (LIPITOR) 80 MG tablet    Sig: Take 1 tablet (80 mg total) by mouth daily.    Dispense:  90 tablet    Refill:  3   albuterol (VENTOLIN HFA) 108 (90 Base) MCG/ACT inhaler    Sig: Inhale 2 puffs into the lungs every 6 (six) hours as needed for wheezing or shortness of breath.    Dispense:  18 g    Refill:  5     Follow-up: Return in about 6 months (around 08/20/2022) for HTN and CKD.    Lindell Spar, MD

## 2022-02-19 NOTE — Assessment & Plan Note (Signed)
S/p stent placement Followed by Cardiology On Aspirin, Plavix and statin

## 2022-02-19 NOTE — Assessment & Plan Note (Signed)
On Crestor Switched to Lipitor from Crestor as her GFR is close to 30 now and has proteinuria

## 2022-02-19 NOTE — Assessment & Plan Note (Signed)
Stable in size, last US abdomen reviewed Next US abdomen for AAA follow-up in 2 years

## 2022-02-19 NOTE — Assessment & Plan Note (Signed)
Uses albuterol as needed Well controlled currently

## 2022-02-20 ENCOUNTER — Ambulatory Visit (HOSPITAL_COMMUNITY): Payer: Medicare Other

## 2022-02-22 ENCOUNTER — Ambulatory Visit (HOSPITAL_COMMUNITY)
Admission: RE | Admit: 2022-02-22 | Discharge: 2022-02-22 | Disposition: A | Discharge status: Home / Self Care | Payer: Medicare Other | Source: Ambulatory Visit | Attending: Physician Assistant | Admitting: Physician Assistant

## 2022-02-22 DIAGNOSIS — Z87891 Personal history of nicotine dependence: Secondary | ICD-10-CM | POA: Diagnosis not present

## 2022-02-22 DIAGNOSIS — F17210 Nicotine dependence, cigarettes, uncomplicated: Secondary | ICD-10-CM | POA: Diagnosis not present

## 2022-02-26 NOTE — Progress Notes (Signed)
Patient notified of LDCT Lung Cancer Screening Results via mail with the recommendation to follow-up in 12 months. Patient's referring provider has been sent a copy of results. Results are as follows:   IMPRESSION: 1. Lung-RADS 2, benign appearance or behavior. Continue annual screening with low-dose chest CT without contrast in 12 months. 2. 3.0 cm infrarenal abdominal aortic aneurysm, incompletely imaged. Recommend follow-up ultrasound every 3 years. This recommendation follows ACR consensus guidelines: White Paper of the ACR Incidental Findings Committee II on Vascular Findings. J Am Coll Radiol 2013; 10:789-794. 3. Aortic Atherosclerosis (ICD10-I70.0) and Emphysema (ICD10-J43.9). Coronary artery atherosclerosis.

## 2022-04-02 ENCOUNTER — Ambulatory Visit (HOSPITAL_COMMUNITY): Payer: Medicare Other

## 2022-04-09 ENCOUNTER — Ambulatory Visit (HOSPITAL_COMMUNITY)
Admission: RE | Admit: 2022-04-09 | Discharge: 2022-04-09 | Disposition: A | Discharge status: Home / Self Care | Payer: Medicare Other | Source: Ambulatory Visit | Attending: Physician Assistant | Admitting: Physician Assistant

## 2022-04-09 DIAGNOSIS — I2510 Atherosclerotic heart disease of native coronary artery without angina pectoris: Secondary | ICD-10-CM | POA: Insufficient documentation

## 2022-04-09 DIAGNOSIS — I6523 Occlusion and stenosis of bilateral carotid arteries: Secondary | ICD-10-CM | POA: Diagnosis not present

## 2022-05-16 ENCOUNTER — Other Ambulatory Visit: Payer: Self-pay | Admitting: Cardiology

## 2022-06-06 ENCOUNTER — Ambulatory Visit: Payer: Medicare Other | Admitting: Cardiology

## 2022-06-11 ENCOUNTER — Inpatient Hospital Stay: Payer: Medicare Other | Attending: Physician Assistant

## 2022-06-11 DIAGNOSIS — I129 Hypertensive chronic kidney disease with stage 1 through stage 4 chronic kidney disease, or unspecified chronic kidney disease: Secondary | ICD-10-CM | POA: Diagnosis not present

## 2022-06-11 DIAGNOSIS — D508 Other iron deficiency anemias: Secondary | ICD-10-CM

## 2022-06-11 DIAGNOSIS — E611 Iron deficiency: Secondary | ICD-10-CM

## 2022-06-11 DIAGNOSIS — N1832 Chronic kidney disease, stage 3b: Secondary | ICD-10-CM | POA: Insufficient documentation

## 2022-06-11 DIAGNOSIS — D631 Anemia in chronic kidney disease: Secondary | ICD-10-CM | POA: Diagnosis not present

## 2022-06-11 DIAGNOSIS — F17210 Nicotine dependence, cigarettes, uncomplicated: Secondary | ICD-10-CM | POA: Diagnosis not present

## 2022-06-11 DIAGNOSIS — R768 Other specified abnormal immunological findings in serum: Secondary | ICD-10-CM

## 2022-06-11 DIAGNOSIS — Z79899 Other long term (current) drug therapy: Secondary | ICD-10-CM | POA: Diagnosis not present

## 2022-06-11 DIAGNOSIS — D539 Nutritional anemia, unspecified: Secondary | ICD-10-CM

## 2022-06-11 DIAGNOSIS — D649 Anemia, unspecified: Secondary | ICD-10-CM

## 2022-06-11 DIAGNOSIS — N189 Chronic kidney disease, unspecified: Secondary | ICD-10-CM

## 2022-06-11 LAB — CBC WITH DIFFERENTIAL/PLATELET
Abs Immature Granulocytes: 0 10*3/uL (ref 0.00–0.07)
Basophils Absolute: 0 10*3/uL (ref 0.0–0.1)
Basophils Relative: 1 %
Eosinophils Absolute: 0.1 10*3/uL (ref 0.0–0.5)
Eosinophils Relative: 3 %
HCT: 32.4 % — ABNORMAL LOW (ref 36.0–46.0)
Hemoglobin: 10.2 g/dL — ABNORMAL LOW (ref 12.0–15.0)
Immature Granulocytes: 0 %
Lymphocytes Relative: 29 %
Lymphs Abs: 1.2 10*3/uL (ref 0.7–4.0)
MCH: 32.6 pg (ref 26.0–34.0)
MCHC: 31.5 g/dL (ref 30.0–36.0)
MCV: 103.5 fL — ABNORMAL HIGH (ref 80.0–100.0)
Monocytes Absolute: 0.2 10*3/uL (ref 0.1–1.0)
Monocytes Relative: 6 %
Neutro Abs: 2.4 10*3/uL (ref 1.7–7.7)
Neutrophils Relative %: 61 %
Platelets: 173 10*3/uL (ref 150–400)
RBC: 3.13 MIL/uL — ABNORMAL LOW (ref 3.87–5.11)
RDW: 14.2 % (ref 11.5–15.5)
WBC: 4 10*3/uL (ref 4.0–10.5)
nRBC: 0 % (ref 0.0–0.2)

## 2022-06-11 LAB — COMPREHENSIVE METABOLIC PANEL
ALT: 11 U/L (ref 0–44)
AST: 12 U/L — ABNORMAL LOW (ref 15–41)
Albumin: 3.9 g/dL (ref 3.5–5.0)
Alkaline Phosphatase: 73 U/L (ref 38–126)
Anion gap: 8 (ref 5–15)
BUN: 21 mg/dL (ref 8–23)
CO2: 24 mmol/L (ref 22–32)
Calcium: 9 mg/dL (ref 8.9–10.3)
Chloride: 105 mmol/L (ref 98–111)
Creatinine, Ser: 1.84 mg/dL — ABNORMAL HIGH (ref 0.44–1.00)
GFR, Estimated: 29 mL/min — ABNORMAL LOW (ref >60)
Glucose, Bld: 90 mg/dL (ref 70–99)
Potassium: 4.1 mmol/L (ref 3.5–5.1)
Sodium: 137 mmol/L (ref 135–145)
Total Bilirubin: 0.7 mg/dL (ref 0.3–1.2)
Total Protein: 7.1 g/dL (ref 6.5–8.1)

## 2022-06-11 LAB — FERRITIN: Ferritin: 18 ng/mL (ref 11–307)

## 2022-06-11 LAB — IRON AND TIBC
Iron: 53 ug/dL (ref 28–170)
Saturation Ratios: 14 % (ref 10.4–31.8)
TIBC: 380 ug/dL (ref 250–450)
UIBC: 327 ug/dL

## 2022-06-16 NOTE — Progress Notes (Unsigned)
Kaitlin [name] [address], [place] [address]   CLINIC:  Medical Oncology/Hematology  PCP:  Anabel Halon, MD [address] [place] [address] [phone]   REASON FOR VISIT:  Follow-up for macrocytic anemia   CURRENT THERAPY: Oral iron supplementation with IV iron PRN  INTERVAL HISTORY:   Kaitlin [name] 73 y.o. 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  She was last seen by Rojelio Brenner PA-C on 12/18/2021.   At today's visit, she reports feeling fairly well. *** No recent hospitalizations, surgeries, or changes in baseline health status. *** She has been taking ferrous sulfate once daily. *** She denies any major bleeding events such as hematemesis, hematochezia, melena, or epistaxis. *** She reports some chronic fatigue, with energy about 65%, and reports that she tires more easily than she used to.  She does note that she does not get enough sleep at night. *** She denies any symptoms of fatigue, pica, restless legs, chest pain, dyspnea on exertion, lightheadedness, or syncope.  She has ***% energy and ***% appetite. She endorses that she is maintaining a stable weight.   ASSESSMENT & PLAN:  1.  Macrocytic anemia  - Previously seen by hematology for normocytic anemia secondary to CKD, iron deficiency, and B12 deficiency.  - EGD (01/28/2017): Gastritis, normal duodenum - Colonoscopy (03/01/2017): Redundant colon, large rectal polyp removed, external hemorrhoids - Stool cards have not been checked at our office, patient reports that one of her other providers (Dr. Darrick Penna) checked them and they were negative. - She denies any signs or symptoms of blood loss.*** - She takes ferrous sulfate daily at home.  She takes vitamin B12 at home.   -- Monoferric x 1 received on 06/22/2021, but without any improvement in her energy - No clear explanation of macrocytosis - No known liver disease  (liver US in 2018 was normal); folate, TSH, LDH, B12, methylmalonic acid, copper normal.  No reticulocytosis. - Most recent labs (06/11/2022): Hgb 10.2/MCV 103.5, otherwise normal CBC/differential.  Ferritin 18, iron saturation 14%.  Baseline creatinine 1.8/GFR 29. - Differential diagnosis favors anemia related to CKD stage IIIb/IV; cause of mild macrocytosis unclear - PLAN: Recommend IV iron with Monoferric x 1 dose.  *** - Continue ferrous sulfate daily.   - Repeat labs and RTC in 6 months   2.  Elevated serum free light chains - MGUS panel (08/30/2020): SPEP and immunofixation normal, elevated light chains with kappa 66.9/lambda 32.3/ratio 2.15 - Repeat MGUS panel (06/09/2021): SPEP and immunofixation normal.  Elevated kappa light chain 69.8, elevated lambda light chain 30.4.  Free light chain ratio is slightly elevated at 2.30, but this would be considered within normal limits for patient with CKD.  LDH normal. - Elevated light chains likely secondary to CKD - PLAN: No further work-up at this time.   3.  CKD stage IIIb/IV - Follows with Dr. Wolfgang Phoenix   4.  Tobacco abuse - This patient meets criteria for low-dose CT lung cancer screening.  She has smoked 1 to 2 packs/day since age 46, currently smoking 1 pack/day but trying to quit. - Shared decision making visit on 12/09/2020 - LDCT chest (02/22/2022): Lung RADS category 2, benign appearance or behavior of lung nodules (incidental findings include 3.0 cm infrarenal abdominal aortic aneurysm, aortic atherosclerosis, emphysema, and coronary artery atherosclerosis) - PLAN: Continue annual LDCT screening (next due January 2025).  Follow-up with PCP regarding other findings.   PLAN SUMMARY: >> *** >> *** >> ***  [name] **VISIT SUMMARY & IMPORTANT INSTRUCTIONS **   You were seen today by Rojelio Brenner PA-C for your ***.    *** ***  *** ***  LABS: Return in ***   OTHER TESTS:  ***  MEDICATIONS: ***  FOLLOW-UP APPOINTMENT: ***     REVIEW OF SYSTEMS: ***  Review of Systems - Oncology   PHYSICAL EXAM:  ECOG PERFORMANCE STATUS: {CHL ONC ECOG QM:[id number]} *** There were no vitals filed for this visit. There were no vitals filed for this visit. Physical Exam  PAST MEDICAL/SURGICAL HISTORY:  Past Medical History:  Diagnosis Date   AAA (abdominal aortic aneurysm) (HCC)    Anemia    Arthritis    Asthma    CAD (coronary artery disease)    stents   Calculus of gallbladder without cholecystitis without obstruction    Carotid artery disease (HCC)    Chronic kidney disease, stage 3a (HCC)    COPD (chronic obstructive pulmonary disease) (HCC)    Emphysema of lung (HCC)    GERD (gastroesophageal reflux disease)    Hyperlipidemia    Hypertension    Iliac artery stenosis, right (HCC)    60%-70%   Myocardial infarction (HCC)    Normocytic anemia 01/26/2017   PAD (peripheral artery disease) (HCC) 12/14/2010   in the left vertebral, bilateral carotids   Past Surgical History:  Procedure Laterality Date   CARDIAC CATHETERIZATION  02/04/2005   A cypher 5.3 x 18 mm at 16 atmosphere of pressure in the mid LAD, this stent covered the proximal part of the LAD and also the mid LAD, this was then post dilated with 3.25 x 15 mm Quantum at 16 atmosphereof pressure for 45 seconds   CATARACT EXTRACTION Bilateral    CHOLECYSTECTOMY N/A 08/16/2017   Procedure: LAPAROSCOPIC CHOLECYSTECTOMY;  Surgeon: Franky Macho, MD;  Location: [facility];  Service: General;  Laterality: N/A;   COLONOSCOPY N/A 03/01/2017   Procedure: COLONOSCOPY;  Surgeon: West Bali, MD;  Location: [facility];  Service: Endoscopy;  Laterality: N/A;  1:00pm   ESOPHAGOGASTRODUODENOSCOPY N/A 01/28/2017   Procedure: ESOPHAGOGASTRODUODENOSCOPY (EGD);  Surgeon: Beverley Fiedler, MD;  Location: [name];  Service: Gastroenterology;  Laterality: N/A;   GIVENS CAPSULE STUDY N/A 03/12/2017   Procedure: GIVENS  CAPSULE STUDY;  Surgeon: West Bali, MD;  Location: [facility];  Service: Endoscopy;  Laterality: N/A;  7:30am   TUBAL LIGATION      SOCIAL HISTORY:  Social History   Socioeconomic History   Marital status: Married    Spouse name: Maisie Fus   Number of children: 3   Years of education: 14   Highest education level: Not on file  Occupational History   Occupation: RETIRED    Comment: warehouse/textiles  Tobacco Use   Smoking status: Every Day    Packs/day: 1.00    Years: 50.00    Additional pack years: 0.00    Total pack years: 50.00    Types: Cigarettes    Start date: 02/09/1967   Smokeless tobacco: Never  Vaping Use   Vaping Use: Never used  Substance and Sexual Activity   Alcohol use: No   Drug use: No   Sexual activity: Yes    Birth control/protection: Post-menopausal  Other Topics Concern   Not on file  Social History Narrative   Retired   Has an AA in [employer] with husband Maisie Fus for 23 years   Stays busy with home, gardens,  likes to can   Social Determinants of Health   Financial Resource Strain: Low Risk  (07/24/2021)   Overall Financial Resource Strain (CARDIA)    Difficulty of Paying Living Expenses: Not hard at all  Food Insecurity: No Food Insecurity (07/24/2021)   Hunger Vital Sign    Worried About Running Out of Food in the Last Year: Never true    Ran Out of Food in the Last Year: Never true  Transportation Needs: No Transportation Needs (07/24/2021)   PRAPARE - Administrator, Civil Service (Medical): No    Lack of Transportation (Non-Medical): No  Physical Activity: Sufficiently Active (07/24/2021)   Exercise Vital Sign    Days of Exercise per Week: 7 days    Minutes of Exercise per Session: 40 min  Stress: No Stress Concern Present (07/24/2021)   [employer] of Occupational Health - Occupational Stress Questionnaire    Feeling of Stress : Not at all  Social Connections: Unknown (07/24/2021)   Social Connection and  Isolation Panel [NHANES]    Frequency of Communication with Friends and Family: More than three times a week    Frequency of Social Gatherings with Friends and Family: Three times a week    Attends Religious Services: Never    Active Member of Clubs or Organizations: No    Attends Banker Meetings: Never    Marital Status: Patient declined  Intimate Partner Violence: Not At Risk (07/24/2021)   Humiliation, Afraid, Rape, and Kick questionnaire    Fear of Current or Ex-Partner: No    Emotionally Abused: No    Physically Abused: No    Sexually Abused: No    FAMILY HISTORY:  Family History  Problem Relation Age of Onset   Heart attack Mother    Hyperlipidemia Mother    Hypertension Mother    Diabetes Mother    Heart attack Father    Early death Father 7   Hyperlipidemia Father    Hypertension Father    Heart disease Sister    Hyperlipidemia Sister    Hypertension Sister    Diabetes Brother    Heart disease Sister    Hyperlipidemia Sister    Hypertension Sister    Heart attack Sister    Heart disease Sister    Hyperlipidemia Sister    Hypertension Sister    Heart attack Brother    Early death Brother 63   Hyperlipidemia Brother    Hypertension Brother    Heart attack Brother    Early death Brother 29   Hyperlipidemia Brother    Hypertension Brother    Cancer Maternal Aunt    Colon cancer Neg Hx    Colon polyps Neg Hx     CURRENT MEDICATIONS:  Outpatient Encounter Medications as of 06/18/2022  Medication Sig   albuterol (VENTOLIN HFA) 108 (90 Base) MCG/ACT inhaler Inhale 2 puffs into the lungs every 6 (six) hours as needed for wheezing or shortness of breath.   aspirin 81 MG chewable tablet Chew 81 mg by mouth daily.   atorvastatin (LIPITOR) 80 MG tablet Take 1 tablet (80 mg total) by mouth daily.   cholecalciferol (VITAMIN D) 1000 units tablet Take 2 Units by mouth daily.    clopidogrel (PLAVIX) 75 MG tablet Take 1 tablet (75 mg total) by mouth daily.    ferrous sulfate 325 (65 FE) MG tablet Take 325 mg by mouth daily with breakfast.   lisinopril (ZESTRIL) 2.5 MG tablet TAKE 1 TABLET BY MOUTH EVERY DAY  NITROSTAT 0.4 MG SL tablet PLACE 1 TABLET UNDER TONGUE EVERY 5 MINUTES FOR 3 DOSES AS NEEDED.   Probiotic Product (PROBIOTIC-10 PO) Take 10 mg by mouth daily.   vitamin B-12 (CYANOCOBALAMIN) 500 MCG tablet Take 500 mcg by mouth daily.   No facility-administered encounter medications on file as of 06/18/2022.    ALLERGIES:  No Known Allergies  LABORATORY DATA:  I have reviewed the labs as listed.  CBC    Component Value Date/Time   WBC 4.0 06/11/2022 1033   RBC 3.13 (L) 06/11/2022 1033   HGB 10.2 (L) 06/11/2022 1033   HCT 32.4 (L) 06/11/2022 1033   PLT 173 06/11/2022 1033   MCV 103.5 (H) 06/11/2022 1033   MCH 32.6 06/11/2022 1033   MCHC 31.5 06/11/2022 1033   RDW 14.2 06/11/2022 1033   LYMPHSABS 1.2 06/11/2022 1033   MONOABS 0.2 06/11/2022 1033   EOSABS 0.1 06/11/2022 1033   BASOSABS 0.0 06/11/2022 1033      Latest Ref Rng & Units 06/11/2022   10:33 AM 02/09/2022    9:31 AM 12/18/2021   10:09 AM  CMP  Glucose 70 - 99 mg/dL 90  95  161   BUN 8 - 23 mg/dL 21  25  24    Creatinine 0.44 - 1.00 mg/dL 0.96  0.45  4.09   Sodium 135 - 145 mmol/L 137  138  141   Potassium 3.5 - 5.1 mmol/L 4.1  3.7  3.7   Chloride 98 - 111 mmol/L 105  103  109   CO2 22 - 32 mmol/L 24  26  26    Calcium 8.9 - 10.3 mg/dL 9.0  8.9  9.1   Total Protein 6.5 - 8.1 g/dL 7.1  7.2  7.4   Total Bilirubin 0.3 - 1.2 mg/dL 0.7  0.4  0.5   Alkaline Phos 38 - 126 U/L 73  87  90   AST 15 - 41 U/L 12  12  12    ALT 0 - 44 U/L 11  9  10      DIAGNOSTIC IMAGING:  I have independently reviewed the relevant imaging and discussed with the patient.   WRAP UP:  All questions were answered. The patient knows to call the clinic with any problems, questions or concerns.  Medical decision making: ***  Time spent on visit: I spent *** minutes counseling the patient  face to face. The total time spent in the appointment was *** minutes and more than 50% was on counseling.  Carnella Guadalajara, PA-C  ***

## 2022-06-18 ENCOUNTER — Other Ambulatory Visit: Payer: Self-pay

## 2022-06-18 ENCOUNTER — Inpatient Hospital Stay (HOSPITAL_BASED_OUTPATIENT_CLINIC_OR_DEPARTMENT_OTHER): Payer: Medicare Other | Admitting: Physician Assistant

## 2022-06-18 ENCOUNTER — Ambulatory Visit: Payer: Medicare Other | Admitting: Physician Assistant

## 2022-06-18 VITALS — BP 102/63 | HR 81 | Temp 97.5°F | Resp 16 | Wt 126.1 lb

## 2022-06-18 DIAGNOSIS — E611 Iron deficiency: Secondary | ICD-10-CM

## 2022-06-18 DIAGNOSIS — D631 Anemia in chronic kidney disease: Secondary | ICD-10-CM | POA: Diagnosis not present

## 2022-06-18 DIAGNOSIS — F17210 Nicotine dependence, cigarettes, uncomplicated: Secondary | ICD-10-CM | POA: Diagnosis not present

## 2022-06-18 DIAGNOSIS — N1832 Chronic kidney disease, stage 3b: Secondary | ICD-10-CM

## 2022-06-18 DIAGNOSIS — Z79899 Other long term (current) drug therapy: Secondary | ICD-10-CM | POA: Diagnosis not present

## 2022-06-18 DIAGNOSIS — I129 Hypertensive chronic kidney disease with stage 1 through stage 4 chronic kidney disease, or unspecified chronic kidney disease: Secondary | ICD-10-CM | POA: Diagnosis not present

## 2022-06-18 NOTE — Patient Instructions (Signed)
[facility] at [facility] Discharge Instructions  You were seen today by Rojelio Brenner PA-C for your history of anemia.  Your blood and iron levels are mildly low.  We will schedule you for IV iron x 1 dose.  LABS: Return in 6 months for repeat labs  MEDICATIONS: Continue iron tablets once daily  FOLLOW-UP APPOINTMENT: Office visit in 6 months, after labs  ** Thank you for trusting me with your healthcare!  I strive to provide all of my patients with quality care at each visit.  If you receive a survey for this visit, I would be so grateful to you for taking the time to provide feedback.  Thank you in advance!  ~ Ramsha Lonigro                   Dr. Doreatha Massed   &   Rojelio Brenner, PA-C   - - - - - - - - - - - - - - - - - -     Thank you for choosing [facility] at [facility] to provide your oncology and hematology care.  To afford each patient quality time with our provider, please arrive at least 15 minutes before your scheduled appointment time.   If you have a lab appointment with the Cancer Center please come in thru the Main Entrance and check in at the main information desk.  You need to re-schedule your appointment should you arrive 10 or more minutes late.  We strive to give you quality time with our providers, and arriving late affects you and other patients whose appointments are after yours.  Also, if you no show three or more times for appointments you may be dismissed from the clinic at the providers discretion.     Again, thank you for choosing [facility].  Our hope is that these requests will decrease the amount of time that you wait before being seen by our physicians.       _____________________________________________________________  Should you have questions after your visit to [facility], please contact our office at [phone] and follow the prompts.  Our office hours are 8:00  a.m. and 4:30 p.m. Monday - Friday.  Please note that voicemails left after 4:00 p.m. may not be returned until the following business day.  We are closed weekends and major holidays.  You do have access to a nurse 24-7, just call the main number to the clinic [phone] and do not press any options, hold on the line and a nurse will answer the phone.    For prescription refill requests, have your pharmacy contact our office and allow 72 hours.    Due to Covid, you will need to wear a mask upon entering the hospital. If you do not have a mask, a mask will be given to you at the Main Entrance upon arrival. For doctor visits, patients may have 1 support person age 51 or older with them. For treatment visits, patients can not have anyone with them due to social distancing guidelines and our immunocompromised population.

## 2022-06-22 ENCOUNTER — Inpatient Hospital Stay: Payer: Medicare Other

## 2022-06-22 VITALS — BP 103/54 | HR 72 | Temp 97.1°F | Resp 18

## 2022-06-22 DIAGNOSIS — D631 Anemia in chronic kidney disease: Secondary | ICD-10-CM | POA: Diagnosis not present

## 2022-06-22 DIAGNOSIS — D649 Anemia, unspecified: Secondary | ICD-10-CM

## 2022-06-22 DIAGNOSIS — I129 Hypertensive chronic kidney disease with stage 1 through stage 4 chronic kidney disease, or unspecified chronic kidney disease: Secondary | ICD-10-CM | POA: Diagnosis not present

## 2022-06-22 DIAGNOSIS — N1832 Chronic kidney disease, stage 3b: Secondary | ICD-10-CM | POA: Diagnosis not present

## 2022-06-22 DIAGNOSIS — F17210 Nicotine dependence, cigarettes, uncomplicated: Secondary | ICD-10-CM | POA: Diagnosis not present

## 2022-06-22 DIAGNOSIS — Z79899 Other long term (current) drug therapy: Secondary | ICD-10-CM | POA: Diagnosis not present

## 2022-06-22 DIAGNOSIS — D508 Other iron deficiency anemias: Secondary | ICD-10-CM

## 2022-06-22 MED ORDER — SODIUM CHLORIDE 0.9 % IV SOLN
1000.0000 mg | Freq: Once | INTRAVENOUS | Status: AC
  Administered 2022-06-22: 1000 mg via INTRAVENOUS
  Filled 2022-06-22: qty 10

## 2022-06-22 MED ORDER — LORATADINE 10 MG PO TABS: 10.0000 mg | ORAL_TABLET | Freq: Once | ORAL | Status: DC

## 2022-06-22 MED ORDER — CETIRIZINE HCL 10 MG PO TABS
10.0000 mg | ORAL_TABLET | Freq: Once | ORAL | Status: AC
  Administered 2022-06-22: 10 mg via ORAL
  Filled 2022-06-22: qty 1

## 2022-06-22 MED ORDER — ACETAMINOPHEN 325 MG PO TABS
650.0000 mg | ORAL_TABLET | Freq: Once | ORAL | Status: AC
  Administered 2022-06-22: 650 mg via ORAL
  Filled 2022-06-22: qty 2

## 2022-06-22 MED ORDER — SODIUM CHLORIDE 0.9 % IV SOLN: Freq: Once | INTRAVENOUS | Status: AC

## 2022-06-22 NOTE — Patient Instructions (Signed)
[facility]  Discharge Instructions: Thank you for choosing [facility] to provide your oncology and hematology care.  If you have a lab appointment with the Cancer Center - please note that after April 8th, 2024, all labs will be drawn in the cancer center.  You do not have to check in or register with the main entrance as you have in the past but will complete your check-in in the cancer center.  Wear comfortable clothing and clothing appropriate for easy access to any Portacath or PICC line.   We strive to give you quality time with your provider. You may need to reschedule your appointment if you arrive late (15 or more minutes).  Arriving late affects you and other patients whose appointments are after yours.  Also, if you miss three or more appointments without notifying the office, you may be dismissed from the clinic at the provider's discretion.      For prescription refill requests, have your pharmacy contact our office and allow 72 hours for refills to be completed.    Today you received the following Monoferric.  Ferric Derisomaltose Injection What is this medication? FERRIC DERISOMALTOSE (FER ik der EYE soe MAWL tose) treats low levels of iron in your body (iron deficiency anemia). Iron is a mineral that plays an important role in making red blood cells, which carry oxygen from your lungs to the rest of your body. This medicine may be used for other purposes; ask your health care provider or pharmacist if you have questions. COMMON BRAND NAME(S): MONOFERRIC What should I tell my care team before I take this medication? They need to know if you have any of these conditions: High levels of iron in the blood An unusual or allergic reaction to iron, other medications, foods, dyes, or preservatives Pregnant or trying to get pregnant Breastfeeding How should I use this medication? This medication is injected into a vein. It is given by your care team  in a hospital or clinic setting. Talk to your care team about the use of this medication in children. Special care may be needed. Overdosage: If you think you have taken too much of this medicine contact a poison control center or emergency room at once. NOTE: This medicine is only for you. Do not share this medicine with others. What if I miss a dose? It is important not to miss your dose. Call your care team if you are unable to keep an appointment. What may interact with this medication? Do not take this medication with any of the following: Deferoxamine Dimercaprol Other iron products This list may not describe all possible interactions. Give your health care provider a list of all the medicines, herbs, non-prescription drugs, or dietary supplements you use. Also tell them if you smoke, drink alcohol, or use illegal drugs. Some items may interact with your medicine. What should I watch for while using this medication? Visit your care team regularly. Tell your care team if your symptoms do not start to get better or if they get worse. You may need blood work done while you are taking this medication. You may need to follow a special diet. Talk to your care team. Foods that contain iron include whole grains/cereals, dried fruits, beans, or peas, leafy green vegetables, and organ meats (liver, kidney). What side effects may I notice from receiving this medication? Side effects that you should report to your care team as soon as possible: Allergic reactions--skin rash, itching, hives,   swelling of the face, lips, tongue, or throat Low blood pressure--dizziness, feeling faint or lightheaded, blurry vision Shortness of breath Side effects that usually do not require medical attention (report to your care team if they continue or are bothersome): Flushing Headache Joint pain Muscle pain Nausea Pain, redness, or irritation at injection site This list may not describe all possible side effects.  Call your doctor for medical advice about side effects. You may report side effects to FDA at 1-800-FDA-1088. Where should I keep my medication? This medication is given in a hospital or clinic and will not be stored at home. NOTE: This sheet is a summary. It may not cover all possible information. If you have questions about this medicine, talk to your doctor, pharmacist, or health care provider.  2024 Elsevier/Gold Standard (2021-07-24 00:00:00)      To help prevent nausea and vomiting after your treatment, we encourage you to take your nausea medication as directed.  BELOW ARE SYMPTOMS THAT SHOULD BE REPORTED IMMEDIATELY: *FEVER GREATER THAN 100.4 F (38 C) OR HIGHER *CHILLS OR SWEATING *NAUSEA AND VOMITING THAT IS NOT CONTROLLED WITH YOUR NAUSEA MEDICATION *UNUSUAL SHORTNESS OF BREATH *UNUSUAL BRUISING OR BLEEDING *URINARY PROBLEMS (pain or burning when urinating, or frequent urination) *BOWEL PROBLEMS (unusual diarrhea, constipation, pain near the anus) TENDERNESS IN MOUTH AND THROAT WITH OR WITHOUT PRESENCE OF ULCERS (sore throat, sores in mouth, or a toothache) UNUSUAL RASH, SWELLING OR PAIN  UNUSUAL VAGINAL DISCHARGE OR ITCHING   Items with * indicate a potential emergency and should be followed up as soon as possible or go to the Emergency Department if any problems should occur.  Please show the CHEMOTHERAPY ALERT CARD or IMMUNOTHERAPY ALERT CARD at check-in to the Emergency Department and triage nurse.  Should you have questions after your visit or need to cancel or reschedule your appointment, please contact [facility] [phone]  and follow the prompts.  Office hours are 8:00 a.m. to 4:30 p.m. Monday - Friday. Please note that voicemails left after 4:00 p.m. may not be returned until the following business day.  We are closed weekends and major holidays. You have access to a nurse at all times for urgent questions. Please call the main number to the  clinic [phone] and follow the prompts.  For any non-urgent questions, you may also contact your provider using MyChart. We now offer e-Visits for anyone 18 and older to request care online for non-urgent symptoms. For details visit [email or web address].   Also download the MyChart app! Go to the app store, search "MyChart", open the app, select , and log in with your MyChart username and password.   

## 2022-06-22 NOTE — Progress Notes (Signed)
Patient presents today for iron infusion.  Patient is in satisfactory condition with no new complaints voiced.  Vital signs are stable.  We will proceed with infusion per provider orders. 

## 2022-07-30 ENCOUNTER — Ambulatory Visit (INDEPENDENT_AMBULATORY_CARE_PROVIDER_SITE_OTHER): Payer: Medicare Other

## 2022-07-30 VITALS — Ht 61.0 in | Wt 125.0 lb

## 2022-07-30 DIAGNOSIS — Z0000 Encounter for general adult medical examination without abnormal findings: Secondary | ICD-10-CM

## 2022-07-30 DIAGNOSIS — Z1231 Encounter for screening mammogram for malignant neoplasm of breast: Secondary | ICD-10-CM

## 2022-07-30 NOTE — Progress Notes (Signed)
Subjective:   Kaitlin [name] is a 72 y.o. female who presents for Medicare Annual (Subsequent) preventive examination.  Visit Complete: Virtual  I connected with  Kaitlin [name] on 07/30/22 by a audio enabled telemedicine application and verified that I am speaking with the correct person using two identifiers.  Patient Location: Home  Provider Location: Home Office  I discussed the limitations of evaluation and management by telemedicine. The patient expressed understanding and agreed to proceed.  Patient Medicare AWV questionnaire was completed by the patient on n/a; I have confirmed that all information answered by patient is correct and no changes since this date.  Review of Systems     Cardiac Risk Factors include: advanced age (>25men, >13 women);dyslipidemia;hypertension;smoking/ tobacco exposure     Objective:    Today's Vitals   07/30/22 1343  Weight: 125 lb (56.7 kg)  Height: 5\' 1"  (1.549 m)   Body mass index is 23.62 kg/m.     07/30/2022    1:43 PM 06/22/2022   11:41 AM 06/18/2022    9:31 AM 12/18/2021   10:54 AM 07/24/2021    1:11 PM 06/22/2021    3:07 PM 06/16/2021    9:58 AM  Advanced Directives  Does Patient Have a Medical Advance Directive? No No No No No No No  Would patient like information on creating a medical advance directive? No - Patient declined  No - Patient declined No - Patient declined No - Patient declined No - Patient declined No - Patient declined    Current Medications (verified) Outpatient Encounter Medications as of 07/30/2022  Medication Sig   albuterol (VENTOLIN HFA) 108 (90 Base) MCG/ACT inhaler Inhale 2 puffs into the lungs every 6 (six) hours as needed for wheezing or shortness of breath.   aspirin 81 MG chewable tablet Chew 81 mg by mouth daily.   atorvastatin (LIPITOR) 80 MG tablet Take 1 tablet (80 mg total) by mouth daily.   cholecalciferol (VITAMIN D) 1000 units tablet Take 2 Units by mouth daily.    clopidogrel (PLAVIX) 75 MG  tablet Take 1 tablet (75 mg total) by mouth daily.   ferrous sulfate 325 (65 FE) MG tablet Take 325 mg by mouth daily with breakfast.   lisinopril (ZESTRIL) 2.5 MG tablet TAKE 1 TABLET BY MOUTH EVERY DAY   Probiotic Product (PROBIOTIC-10 PO) Take 10 mg by mouth daily.   vitamin B-12 (CYANOCOBALAMIN) 500 MCG tablet Take 500 mcg by mouth daily.   NITROSTAT 0.4 MG SL tablet PLACE 1 TABLET UNDER TONGUE EVERY 5 MINUTES FOR 3 DOSES AS NEEDED. (Patient not taking: Reported on 06/18/2022)   No facility-administered encounter medications on file as of 07/30/2022.    Allergies (verified) Patient has no known allergies.   History: Past Medical History:  Diagnosis Date   AAA (abdominal aortic aneurysm) (HCC)    Anemia    Arthritis    Asthma    CAD (coronary artery disease)    stents   Calculus of gallbladder without cholecystitis without obstruction    Carotid artery disease (HCC)    Chronic kidney disease, stage 3a (HCC)    COPD (chronic obstructive pulmonary disease) (HCC)    Emphysema of lung (HCC)    GERD (gastroesophageal reflux disease)    Hyperlipidemia    Hypertension    Iliac artery stenosis, right (HCC)    60%-70%   Myocardial infarction (HCC)    Normocytic anemia 01/26/2017   PAD (peripheral artery disease) (HCC) 12/14/2010   in the left  vertebral, bilateral carotids   Past Surgical History:  Procedure Laterality Date   CARDIAC CATHETERIZATION  02/04/2005   A cypher 5.3 x 18 mm at 16 atmosphere of pressure in the mid LAD, this stent covered the proximal part of the LAD and also the mid LAD, this was then post dilated with 3.25 x 15 mm Quantum at 16 atmosphereof pressure for 45 seconds   CATARACT EXTRACTION Bilateral    CHOLECYSTECTOMY N/A 08/16/2017   Procedure: LAPAROSCOPIC CHOLECYSTECTOMY;  Surgeon: Franky Macho, MD;  Location: [facility];  Service: General;  Laterality: N/A;   COLONOSCOPY N/A 03/01/2017   Procedure: COLONOSCOPY;  Surgeon: West Bali, MD;  Location: [facility];  Service: Endoscopy;  Laterality: N/A;  1:00pm   ESOPHAGOGASTRODUODENOSCOPY N/A 01/28/2017   Procedure: ESOPHAGOGASTRODUODENOSCOPY (EGD);  Surgeon: Beverley Fiedler, MD;  Location: [facility] ENDOSCOPY;  Service: Gastroenterology;  Laterality: N/A;   GIVENS CAPSULE STUDY N/A 03/12/2017   Procedure: GIVENS CAPSULE STUDY;  Surgeon: West Bali, MD;  Location: [facility];  Service: Endoscopy;  Laterality: N/A;  7:30am   TUBAL LIGATION     Family History  Problem Relation Age of Onset   Heart attack Mother    Hyperlipidemia Mother    Hypertension Mother    Diabetes Mother    Heart attack Father    Early death Father 34   Hyperlipidemia Father    Hypertension Father    Heart disease Sister    Hyperlipidemia Sister    Hypertension Sister    Diabetes Brother    Heart disease Sister    Hyperlipidemia Sister    Hypertension Sister    Heart attack Sister    Heart disease Sister    Hyperlipidemia Sister    Hypertension Sister    Heart attack Brother    Early death Brother 22   Hyperlipidemia Brother    Hypertension Brother    Heart attack Brother    Early death Brother 33   Hyperlipidemia Brother    Hypertension Brother    Cancer Maternal Aunt    Colon cancer Neg Hx    Colon polyps Neg Hx    Social History   Socioeconomic History   Marital status: Married    Spouse name: Maisie Fus   Number of children: 3   Years of education: 14   Highest education level: Not on file  Occupational History   Occupation: RETIRED    Comment: warehouse/textiles  Tobacco Use   Smoking status: Every Day    Packs/day: 1.00    Years: 50.00    Additional pack years: 0.00    Total pack years: 50.00    Types: Cigarettes    Start date: 02/09/1967   Smokeless tobacco: Never  Vaping Use   Vaping Use: Never used  Substance and Sexual Activity   Alcohol use: No   Drug use: No   Sexual activity: Yes    Birth control/protection: Post-menopausal  Other Topics Concern   Not on file  Social History  Narrative   Retired   Has an AA in [employer] with husband Maisie Fus for 23 years   Stays busy with home, gardens, likes to can   Social Determinants of Health   Financial Resource Strain: Low Risk  (07/30/2022)   Overall Financial Resource Strain (CARDIA)    Difficulty of Paying Living Expenses: Not hard at all  Food Insecurity: No Food Insecurity (07/30/2022)   Hunger Vital Sign    Worried About Running Out of Food  in the Last Year: Never true    Ran Out of Food in the Last Year: Never true  Transportation Needs: No Transportation Needs (07/30/2022)   PRAPARE - Administrator, Civil Service (Medical): No    Lack of Transportation (Non-Medical): No  Physical Activity: Sufficiently Active (07/30/2022)   Exercise Vital Sign    Days of Exercise per Week: 7 days    Minutes of Exercise per Session: 40 min  Stress: No Stress Concern Present (07/30/2022)   [employer] of Occupational Health - Occupational Stress Questionnaire    Feeling of Stress : Not at all  Social Connections: Moderately Integrated (07/30/2022)   Social Connection and Isolation Panel [NHANES]    Frequency of Communication with Friends and Family: More than three times a week    Frequency of Social Gatherings with Friends and Family: More than three times a week    Attends Religious Services: More than 4 times per year    Active Member of [employer] or Organizations: No    Attends Engineer, structural: Never    Marital Status: Married    Tobacco Counseling Ready to quit: Yes Counseling given: Yes   Clinical Intake:  Pre-visit preparation completed: Yes        BMI - recorded: 23.62 Nutritional Status: BMI of 19-24  Normal Nutritional Risks: None Diabetes: No  How often do you need to have someone help you when you read instructions, pamphlets, or other written materials from your doctor or pharmacy?: 1 - Never  Interpreter Needed?: No  Information entered by :: Abby Fredricka Kohrs  CMA   Activities of Daily Living    07/30/2022    1:49 PM  In your present state of health, do you have any difficulty performing the following activities:  Hearing? 0  Vision? 0  Difficulty concentrating or making decisions? 0  Walking or climbing stairs? 0  Dressing or bathing? 0  Doing errands, shopping? 0  Preparing Food and eating ? N  Using the Toilet? N  In the past six months, have you accidently leaked urine? N  Do you have problems with loss of bowel control? N  Managing your Medications? N  Managing your Finances? N  Housekeeping or managing your Housekeeping? N    Patient Care Team: Anabel Halon, MD as PCP - General (Internal Medicine) Wyline Mood Dorothe Pea, MD as PCP - Cardiology (Cardiology) Wyline Mood Dorothe Pea, MD as Consulting Physician (Cardiology) West Bali, MD (Inactive) as Consulting Physician (Gastroenterology) Lanelle Bal, DO as Consulting Physician (Internal Medicine)  Indicate any recent Medical Services you may have received from other than [facility] providers in the past year (date may be approximate).     Assessment:   This is a routine wellness examination for Maylynn.  Hearing/Vision screen Hearing Screening - Comments:: Patient denies any hearing difficulties.    Dietary issues and exercise activities discussed:     Goals Addressed             This Visit's Progress    Patient Stated       Increase water intake        Depression Screen    07/30/2022    1:49 PM 02/19/2022    1:35 PM 08/17/2021    1:10 PM 07/24/2021    1:11 PM 07/24/2021    1:08 PM 02/16/2021    2:07 PM 10/17/2020    1:58 PM  PHQ 2/9 Scores  PHQ - 2 Score 0 0 0 0 0  0 0    Fall Risk    07/30/2022    1:49 PM 02/19/2022    1:35 PM 08/17/2021    1:09 PM 07/24/2021    1:11 PM 02/16/2021    2:07 PM  Fall Risk   Falls in the past year? 0 0 0 0 0  Number falls in past yr: 0 0 0 0 0  Injury with Fall? 0 0 0 0 0  Risk for fall due to : No Fall Risks  No Fall Risks  No Fall Risks No Fall Risks  Follow up Falls prevention discussed  Falls evaluation completed Falls evaluation completed Falls evaluation completed    MEDICARE RISK AT HOME:  Medicare Risk at Home - 07/30/22 1345     Any stairs in or around the home? Yes    If so, are there any without handrails? No    Home free of loose throw rugs in walkways, pet beds, electrical cords, etc? Yes    Adequate lighting in your home to reduce risk of falls? Yes    Life alert? No    Use of a cane, walker or w/c? No    Grab bars in the bathroom? No    Shower chair or bench in shower? Yes    Elevated toilet seat or a handicapped toilet? No             TIMED UP AND GO:  Was the test performed?  No    Cognitive Function:    07/24/2021    1:12 PM  MMSE - Mini Mental State Exam  Not completed: Unable to complete        07/30/2022    1:46 PM 07/24/2021    1:12 PM 07/20/2020    1:31 PM 05/11/2019   11:07 AM  6CIT Screen  What Year? 0 points 0 points 0 points 0 points  What month? 0 points 0 points 0 points 0 points  What time? 0 points 0 points 0 points 0 points  Count back from 20 0 points 0 points 0 points 0 points  Months in reverse 0 points 0 points 0 points 0 points  Repeat phrase 0 points 0 points 0 points 0 points  Total Score 0 points 0 points 0 points 0 points    Immunizations Immunization History  Administered Date(s) Administered   Fluad Quad(high Dose 65+) 10/17/2020   Influenza, High Dose Seasonal PF 11/06/2017   Influenza,inj,Quad PF,6+ Mos 10/25/2016, 12/04/2018   Influenza-Unspecified 11/27/2021   Moderna Sars-Covid-2 Vaccination 03/25/2019, 04/22/2019   PNEUMOCOCCAL CONJUGATE-20 02/16/2021   Tdap 01/30/2011    TDAP status: Due, Education has been provided regarding the importance of this vaccine. Advised may receive this vaccine at local pharmacy or Health Dept. Aware to provide a copy of the vaccination record if obtained from local pharmacy or Health Dept. Verbalized  acceptance and understanding.  Flu Vaccine status: Up to date  Pneumococcal vaccine status: Up to date  Covid-19 vaccine status: Information provided on how to obtain vaccines.   Qualifies for Shingles Vaccine? Yes   Zostavax completed No   Shingrix Completed?: No.    Education has been provided regarding the importance of this vaccine. Patient has been advised to call insurance company to determine out of pocket expense if they have not yet received this vaccine. Advised may also receive vaccine at local pharmacy or Health Dept. Verbalized acceptance and understanding.  Screening Tests Health Maintenance  Topic Date Due   COVID-19 Vaccine (3 - Moderna  risk series) 05/20/2019   DTaP/Tdap/Td (2 - Td or Tdap) 01/29/2021   Zoster Vaccines- Shingrix (2 of 2) 06/28/2022   Medicare Annual Wellness (AWV)  07/25/2022   INFLUENZA VACCINE  08/30/2022   Lung Cancer Screening  02/23/2023   MAMMOGRAM  09/13/2023   Colonoscopy  03/02/2027   Pneumonia Vaccine 21+ Years old  Completed   DEXA SCAN  Completed   Hepatitis C Screening  Completed   HPV VACCINES  Aged Out    Health Maintenance  Health Maintenance Due  Topic Date Due   COVID-19 Vaccine (3 - Moderna risk series) 05/20/2019   DTaP/Tdap/Td (2 - Td or Tdap) 01/29/2021   Zoster Vaccines- Shingrix (2 of 2) 06/28/2022   Medicare Annual Wellness (AWV)  07/25/2022    Colorectal cancer screening: Type of screening: Colonoscopy. Completed 03/01/2017. Repeat every 10 years  Mammogram status: Ordered 07/30/2022. Pt provided with contact info and advised to call to schedule appt.   Bone Density status: Completed 09/12/2021. Results reflect: Bone density results: OSTEOPOROSIS. Repeat every 2 years.  Lung Cancer Screening: (Low Dose CT Chest recommended if Age 59-80 years, 20 pack-year currently smoking OR have quit w/in 15years.) does qualify.   Lung Cancer Screening Referral: completed 02/22/2022  Additional Screening:  Hepatitis C  Screening: does not qualify; Completed 12/19/2020  Vision Screening: Recommended annual ophthalmology exams for early detection of glaucoma and other disorders of the eye. Is the patient up to date with their annual eye exam?  No  Who is the provider or what is the name of the office in which the patient attends annual eye exams? Patient declined referral If pt is not established with a provider, would they like to be referred to a provider to establish care? No .   Dental Screening: Recommended annual dental exams for proper oral hygiene  Diabetic Foot Exam: n/a  Community Resource Referral / Chronic Care Management: CRR required this visit?  No   CCM required this visit?  No     Plan:     I have personally reviewed and noted the following in the patient's chart:   Medical and social history Use of alcohol, tobacco or illicit drugs  Current medications and supplements including opioid prescriptions. Patient is not currently taking opioid prescriptions. Functional ability and status Nutritional status Physical activity Advanced directives List of other physicians Hospitalizations, surgeries, and ER visits in previous 12 months Vitals Screenings to include cognitive, depression, and falls Referrals and appointments  In addition, I have reviewed and discussed with patient certain preventive protocols, quality metrics, and best practice recommendations. A written personalized care plan for preventive services as well as general preventive health recommendations were provided to patient.   Because this visit was a virtual/telehealth visit,  certain criteria was not obtained, such a blood pressure, CBG if patient is a diabetic, and timed up and go.  Any medications not marked as taking were not mentioned by the patient (or their caregiver if applicable) when reconciling the medications.    Jordan Hawks Hipolito Martinezlopez, CMA   07/30/2022   After Visit Summary: (Mail) Due to this being a  telephonic visit, the after visit summary with patients personalized plan was offered to patient via mail   Nurse Notes: mammogram ordered

## 2022-07-30 NOTE — Patient Instructions (Signed)
Kaitlin [name] , Thank you for taking time to come for your Medicare Wellness Visit. I appreciate your ongoing commitment to your health goals. Please review the following plan we discussed and let me know if I can assist you in the future.   These are the goals we discussed:  Goals      Patient Stated     None     Patient Stated     Increase water intake      STOP SMOKING        This is a list of the screening recommended for you and due dates:  Health Maintenance  Topic Date Due   COVID-19 Vaccine (3 - Moderna risk series) 05/20/2019   DTaP/Tdap/Td vaccine (2 - Td or Tdap) 01/29/2021   Zoster (Shingles) Vaccine (2 of 2) 06/28/2022   Flu Shot  08/30/2022   Screening for Lung Cancer  02/23/2023   Medicare Annual Wellness Visit  07/30/2023   Mammogram  09/13/2023   Colon Cancer Screening  03/02/2027   Pneumonia Vaccine  Completed   DEXA scan (bone density measurement)  Completed   Hepatitis C Screening  Completed   HPV Vaccine  Aged Out    Advanced directives: Advance directive discussed with you today. Even though you declined this today, please call our office should you change your mind, and we can give you the proper paperwork for you to fill out. Advance care planning is a way to make decisions about medical care that fits your values in case you are ever unable to make these decisions for yourself.  Information on Advanced Care Planning can be found at [facility] Advance Health Care Directives Advance Health Care Directives ([email or web address]    Conditions/risks identified: You have an order for:  []   2D Mammogram  [x]   3D Mammogram  []   Bone Density   []   Lung Cancer Screening  Please call for appointment:   [facility] [address] [place], [place] [address] [phone]  Make sure to wear two-piece clothing.  No lotions powders or deodorants the day of the appointment Make sure to bring picture ID and  insurance card.  Bring list of medications you are currently taking including any supplements.   Schedule your [place] screening mammogram through MyChart!   Log into your MyChart account.  Go to 'Visit' (or 'Appointments' if on mobile App) --> Schedule an Appointment  Under 'Select a Reason for Visit' choose the Mammogram Screening option.  Complete the pre-visit questions and select the time and place that best fits your schedule.  You are due for the vaccines checked below. You may have these done at your preferred pharmacy. Please have them fax the office proof of the vaccines so that we can update your chart.   [x]  Shingrix (Shingles vaccine) []  Pneumonia Vaccines [x]  TDAP (Tetanus) Vaccine [x]  Covid-19   Next appointment: VIRTUAL/TELEPHONE APPOINTMENT Follow up in one year for your annual wellness visit  September 04, 2023 at 2pm telephone visit   Preventive Care 65 Years and Older, Female Preventive care refers to lifestyle choices and visits with your health care provider that can promote health and wellness. What does preventive care include? A yearly physical exam. This is also called an annual well check. Dental exams once or twice a year. Routine eye exams. Ask your health care provider how often you should have your eyes checked. Personal lifestyle choices, including: Daily care of your teeth  and gums. Regular physical activity. Eating a healthy diet. Avoiding tobacco and drug use. Limiting alcohol use. Practicing safe sex. Taking low-dose aspirin every day. Taking vitamin and mineral supplements as recommended by your health care provider. What happens during an annual well check? The services and screenings done by your health care provider during your annual well check will depend on your age, overall health, lifestyle risk factors, and family history of disease. Counseling  Your health care provider may ask you questions about your: Alcohol use. Tobacco  use. Drug use. Emotional well-being. Home and relationship well-being. Sexual activity. Eating habits. History of falls. Memory and ability to understand (cognition). Work and work Astronomer. Reproductive health. Screening  You may have the following tests or measurements: Height, weight, and BMI. Blood pressure. Lipid and cholesterol levels. These may be checked every 5 years, or more frequently if you are over 34 years old. Skin check. Lung cancer screening. You may have this screening every year starting at age 77 if you have a 30-pack-year history of smoking and currently smoke or have quit within the past 15 years. Fecal occult blood test (FOBT) of the stool. You may have this test every year starting at age 51. Flexible sigmoidoscopy or colonoscopy. You may have a sigmoidoscopy every 5 years or a colonoscopy every 10 years starting at age 24. Hepatitis C blood test. Hepatitis B blood test. Sexually transmitted disease (STD) testing. Diabetes screening. This is done by checking your blood sugar (glucose) after you have not eaten for a while (fasting). You may have this done every 1-3 years. Bone density scan. This is done to screen for osteoporosis. You may have this done starting at age 63. Mammogram. This may be done every 1-2 years. Talk to your health care provider about how often you should have regular mammograms. Talk with your health care provider about your test results, treatment options, and if necessary, the need for more tests. Vaccines  Your health care provider may recommend certain vaccines, such as: Influenza vaccine. This is recommended every year. Tetanus, diphtheria, and acellular pertussis (Tdap, Td) vaccine. You may need a Td booster every 10 years. Zoster vaccine. You may need this after age 47. Pneumococcal 13-valent conjugate (PCV13) vaccine. One dose is recommended after age 89. Pneumococcal polysaccharide (PPSV23) vaccine. One dose is recommended  after age 34. Talk to your health care provider about which screenings and vaccines you need and how often you need them. This information is not intended to replace advice given to you by your health care provider. Make sure you discuss any questions you have with your health care provider. Document Released: 02/11/2015 Document Revised: 10/05/2015 Document Reviewed: 11/16/2014 Elsevier Interactive Patient Education  2017 [employer].  Fall Prevention in the Home Falls can cause injuries. They can happen to people of all ages. There are many things you can do to make your home safe and to help prevent falls. What can I do on the outside of my home? Regularly fix the edges of walkways and driveways and fix any cracks. Remove anything that might make you trip as you walk through a door, such as a raised step or threshold. Trim any bushes or trees on the path to your home. Use bright outdoor lighting. Clear any walking paths of anything that might make someone trip, such as rocks or tools. Regularly check to see if handrails are loose or broken. Make sure that both sides of any steps have handrails. Any raised decks and porches  should have guardrails on the edges. Have any leaves, snow, or ice cleared regularly. Use sand or salt on walking paths during winter. Clean up any spills in your garage right away. This includes oil or grease spills. What can I do in the bathroom? Use night lights. Install grab bars by the toilet and in the tub and shower. Do not use towel bars as grab bars. Use non-skid mats or decals in the tub or shower. If you need to sit down in the shower, use a plastic, non-slip stool. Keep the floor dry. Clean up any water that spills on the floor as soon as it happens. Remove soap buildup in the tub or shower regularly. Attach bath mats securely with double-sided non-slip rug tape. Do not have throw rugs and other things on the floor that can make you trip. What can I do  in the bedroom? Use night lights. Make sure that you have a light by your bed that is easy to reach. Do not use any sheets or blankets that are too big for your bed. They should not hang down onto the floor. Have a firm chair that has side arms. You can use this for support while you get dressed. Do not have throw rugs and other things on the floor that can make you trip. What can I do in the kitchen? Clean up any spills right away. Avoid walking on wet floors. Keep items that you use a lot in easy-to-reach places. If you need to reach something above you, use a strong step stool that has a grab bar. Keep electrical cords out of the way. Do not use floor polish or wax that makes floors slippery. If you must use wax, use non-skid floor wax. Do not have throw rugs and other things on the floor that can make you trip. What can I do with my stairs? Do not leave any items on the stairs. Make sure that there are handrails on both sides of the stairs and use them. Fix handrails that are broken or loose. Make sure that handrails are as long as the stairways. Check any carpeting to make sure that it is firmly attached to the stairs. Fix any carpet that is loose or worn. Avoid having throw rugs at the top or bottom of the stairs. If you do have throw rugs, attach them to the floor with carpet tape. Make sure that you have a light switch at the top of the stairs and the bottom of the stairs. If you do not have them, ask someone to add them for you. What else can I do to help prevent falls? Wear shoes that: Do not have high heels. Have rubber bottoms. Are comfortable and fit you well. Are closed at the toe. Do not wear sandals. If you use a stepladder: Make sure that it is fully opened. Do not climb a closed stepladder. Make sure that both sides of the stepladder are locked into place. Ask someone to hold it for you, if possible. Clearly mark and make sure that you can see: Any grab bars or  handrails. First and last steps. Where the edge of each step is. Use tools that help you move around (mobility aids) if they are needed. These include: Canes. Walkers. Scooters. Crutches. Turn on the lights when you go into a dark area. Replace any light bulbs as soon as they burn out. Set up your furniture so you have a clear path. Avoid moving your furniture around. If any  of your floors are uneven, fix them. If there are any pets around you, be aware of where they are. Review your medicines with your doctor. Some medicines can make you feel dizzy. This can increase your chance of falling. Ask your doctor what other things that you can do to help prevent falls. This information is not intended to replace advice given to you by your health care provider. Make sure you discuss any questions you have with your health care provider. Document Released: 11/11/2008 Document Revised: 06/23/2015 Document Reviewed: 02/19/2014 Elsevier Interactive Patient Education  2017 [employer].

## 2022-08-20 ENCOUNTER — Other Ambulatory Visit: Payer: Self-pay | Admitting: Cardiology

## 2022-08-22 ENCOUNTER — Ambulatory Visit (INDEPENDENT_AMBULATORY_CARE_PROVIDER_SITE_OTHER): Payer: Medicare Other | Admitting: Internal Medicine

## 2022-08-22 ENCOUNTER — Encounter: Payer: Self-pay | Admitting: Internal Medicine

## 2022-08-22 VITALS — BP 100/56 | HR 86 | Ht 61.0 in | Wt 121.8 lb

## 2022-08-22 DIAGNOSIS — J432 Centrilobular emphysema: Secondary | ICD-10-CM

## 2022-08-22 DIAGNOSIS — N1832 Chronic kidney disease, stage 3b: Secondary | ICD-10-CM | POA: Diagnosis not present

## 2022-08-22 DIAGNOSIS — I2510 Atherosclerotic heart disease of native coronary artery without angina pectoris: Secondary | ICD-10-CM

## 2022-08-22 DIAGNOSIS — Z720 Tobacco use: Secondary | ICD-10-CM | POA: Diagnosis not present

## 2022-08-22 DIAGNOSIS — I714 Abdominal aortic aneurysm, without rupture, unspecified: Secondary | ICD-10-CM | POA: Diagnosis not present

## 2022-08-22 NOTE — Assessment & Plan Note (Signed)
Stable in size, last US abdomen reviewed Recent CT chest reviewed, showed stable sized infrarenal AAA Next US abdomen for AAA follow-up in 2 years

## 2022-08-22 NOTE — Assessment & Plan Note (Signed)
S/p stent placement Followed by Cardiology On Aspirin, Plavix and statin

## 2022-08-22 NOTE — Progress Notes (Signed)
Established Patient Office Visit  Subjective:  Patient ID: Kaitlin [name], female    DOB: [birth year]  Age: 72 y.o. MRN: [id number]  CC:  Chief Complaint  Patient presents with   COPD    Follow up     HPI Kaitlin [name] is a 72 y.o. female with past medical history of CAD s/p stent placement, PAD, AAA, carotid stenosis s/p right carotid and arterectomy, HLD, COPD, CKD stage IIIb and tobacco abuse who presents for f/u of her chronic medical conditions.  She sees cardiologist for history of CAD, PAD, AAA and carotid stenosis.  She is on aspirin, Plavix and statin.  She denies any chest pain, dyspnea, LE swelling or leg claudication.  She has seen Dr. Wolfgang Phoenix for CKD stage IIIb.  Last BMP from chart has been reviewed and discussed with the patient.  She admits that she needs to improve fluid intake as she has only coffee (3 cups) as fluid intake in a day. She denies any dysuria or hematuria currently.  Denies any urinary hesitance or resistance.  She needs follow-up visit with Dr. Wolfgang Phoenix.  She has used albuterol occasionally for dyspnea.  She has history of COPD and has chronic cough.  She denies any fever, chills or wheezing currently.     Past Medical History:  Diagnosis Date   AAA (abdominal aortic aneurysm) (HCC)    Anemia    Arthritis    Asthma    CAD (coronary artery disease)    stents   Calculus of gallbladder without cholecystitis without obstruction    Carotid artery disease (HCC)    Chronic kidney disease, stage 3a (HCC)    COPD (chronic obstructive pulmonary disease) (HCC)    Emphysema of lung (HCC)    GERD (gastroesophageal reflux disease)    Hyperlipidemia    Hypertension    Iliac artery stenosis, right (HCC)    60%-70%   Myocardial infarction (HCC)    Normocytic anemia 01/26/2017   PAD (peripheral artery disease) (HCC) 12/14/2010   in the left vertebral, bilateral carotids    Past Surgical History:  Procedure Laterality Date   CARDIAC CATHETERIZATION   02/04/2005   A cypher 5.3 x 18 mm at 16 atmosphere of pressure in the mid LAD, this stent covered the proximal part of the LAD and also the mid LAD, this was then post dilated with 3.25 x 15 mm Quantum at 16 atmosphereof pressure for 45 seconds   CATARACT EXTRACTION Bilateral    CHOLECYSTECTOMY N/A 08/16/2017   Procedure: LAPAROSCOPIC CHOLECYSTECTOMY;  Surgeon: Franky Macho, MD;  Location: [facility];  Service: General;  Laterality: N/A;   COLONOSCOPY N/A 03/01/2017   Procedure: COLONOSCOPY;  Surgeon: West Bali, MD;  Location: [facility];  Service: Endoscopy;  Laterality: N/A;  1:00pm   ESOPHAGOGASTRODUODENOSCOPY N/A 01/28/2017   Procedure: ESOPHAGOGASTRODUODENOSCOPY (EGD);  Surgeon: Beverley Fiedler, MD;  Location: [facility];  Service: Gastroenterology;  Laterality: N/A;   GIVENS CAPSULE STUDY N/A 03/12/2017   Procedure: GIVENS CAPSULE STUDY;  Surgeon: West Bali, MD;  Location: [facility];  Service: Endoscopy;  Laterality: N/A;  7:30am   TUBAL LIGATION      Family History  Problem Relation Age of Onset   Heart attack Mother    Hyperlipidemia Mother    Hypertension Mother    Diabetes Mother    Heart attack Father    Early death Father 80   Hyperlipidemia Father    Hypertension Father    Heart disease  Sister    Hyperlipidemia Sister    Hypertension Sister    Diabetes Brother    Heart disease Sister    Hyperlipidemia Sister    Hypertension Sister    Heart attack Sister    Heart disease Sister    Hyperlipidemia Sister    Hypertension Sister    Heart attack Brother    Early death Brother 14   Hyperlipidemia Brother    Hypertension Brother    Heart attack Brother    Early death Brother 13   Hyperlipidemia Brother    Hypertension Brother    Cancer Maternal Aunt    Colon cancer Neg Hx    Colon polyps Neg Hx     Social History   Socioeconomic History   Marital status: Married    Spouse name: Maisie Fus   Number of children: 3   Years of education: 14   Highest  education level: Not on file  Occupational History   Occupation: RETIRED    Comment: warehouse/textiles  Tobacco Use   Smoking status: Every Day    Current packs/day: 1.00    Average packs/day: 1 pack/day for 55.5 years (55.5 ttl pk-yrs)    Types: Cigarettes    Start date: 02/09/1967   Smokeless tobacco: Never  Vaping Use   Vaping status: Never Used  Substance and Sexual Activity   Alcohol use: No   Drug use: No   Sexual activity: Yes    Birth control/protection: Post-menopausal  Other Topics Concern   Not on file  Social History Narrative   Retired   Has an AA in [employer] with husband Maisie Fus for 23 years   Stays busy with home, gardens, likes to can   Social Determinants of Health   Financial Resource Strain: Low Risk  (07/30/2022)   Overall Financial Resource Strain (CARDIA)    Difficulty of Paying Living Expenses: Not hard at all  Food Insecurity: No Food Insecurity (07/30/2022)   Hunger Vital Sign    Worried About Running Out of Food in the Last Year: Never true    Ran Out of Food in the Last Year: Never true  Transportation Needs: No Transportation Needs (07/30/2022)   PRAPARE - Administrator, Civil Service (Medical): No    Lack of Transportation (Non-Medical): No  Physical Activity: Sufficiently Active (07/30/2022)   Exercise Vital Sign    Days of Exercise per Week: 7 days    Minutes of Exercise per Session: 40 min  Stress: No Stress Concern Present (07/30/2022)   [employer] of Occupational Health - Occupational Stress Questionnaire    Feeling of Stress : Not at all  Social Connections: Moderately Integrated (07/30/2022)   Social Connection and Isolation Panel [NHANES]    Frequency of Communication with Friends and Family: More than three times a week    Frequency of Social Gatherings with Friends and Family: More than three times a week    Attends Religious Services: More than 4 times per year    Active Member of [employer] or Organizations: No     Attends Banker Meetings: Never    Marital Status: Married  Catering manager Violence: Not At Risk (07/30/2022)   Humiliation, Afraid, Rape, and Kick questionnaire    Fear of Current or Ex-Partner: No    Emotionally Abused: No    Physically Abused: No    Sexually Abused: No    Outpatient Medications Prior to Visit  Medication Sig Dispense Refill   albuterol (VENTOLIN HFA)  108 (90 Base) MCG/ACT inhaler Inhale 2 puffs into the lungs every 6 (six) hours as needed for wheezing or shortness of breath. 18 g 5   aspirin 81 MG chewable tablet Chew 81 mg by mouth daily.     atorvastatin (LIPITOR) 80 MG tablet Take 1 tablet (80 mg total) by mouth daily. 90 tablet 3   cholecalciferol (VITAMIN D) 1000 units tablet Take 2 Units by mouth daily.      clopidogrel (PLAVIX) 75 MG tablet TAKE 1 TABLET BY MOUTH EVERY DAY 90 tablet 1   ferrous sulfate 325 (65 FE) MG tablet Take 325 mg by mouth daily with breakfast.     lisinopril (ZESTRIL) 2.5 MG tablet TAKE 1 TABLET BY MOUTH EVERY DAY 90 tablet 3   NITROSTAT 0.4 MG SL tablet PLACE 1 TABLET UNDER TONGUE EVERY 5 MINUTES FOR 3 DOSES AS NEEDED. (Patient not taking: Reported on 06/18/2022) 25 tablet 3   Probiotic Product (PROBIOTIC-10 PO) Take 10 mg by mouth daily.     vitamin B-12 (CYANOCOBALAMIN) 500 MCG tablet Take 500 mcg by mouth daily.     No facility-administered medications prior to visit.    No Known Allergies  ROS Review of Systems  Constitutional:  Negative for chills and fever.  HENT:  Negative for congestion, sinus pressure, sinus pain and sore throat.   Eyes:  Negative for pain and discharge.  Respiratory:  Negative for cough and shortness of breath.   Cardiovascular:  Negative for chest pain and palpitations.  Gastrointestinal:  Negative for abdominal pain, constipation, diarrhea, nausea and vomiting.  Endocrine: Negative for polydipsia and polyuria.  Genitourinary:  Negative for dysuria and hematuria.  Musculoskeletal:   Negative for neck pain and neck stiffness.  Skin:  Negative for rash.  Neurological:  Negative for dizziness and weakness.  Psychiatric/Behavioral:  Negative for agitation and behavioral problems.       Objective:    Physical Exam Vitals reviewed.  Constitutional:      General: She is not in acute distress.    Appearance: She is not diaphoretic.  HENT:     Head: Normocephalic and atraumatic.     Nose: Nose normal.     Mouth/Throat:     Mouth: Mucous membranes are moist.  Eyes:     General: No scleral icterus.    Extraocular Movements: Extraocular movements intact.  Cardiovascular:     Rate and Rhythm: Normal rate and regular rhythm.     Pulses: Normal pulses.     Heart sounds: Normal heart sounds. No murmur heard. Pulmonary:     Breath sounds: Normal breath sounds. No wheezing or rales.  Musculoskeletal:     Cervical back: Neck supple. No tenderness.     Right lower leg: No edema.     Left lower leg: No edema.  Skin:    General: Skin is warm.     Findings: No rash.  Neurological:     General: No focal deficit present.     Mental Status: She is alert and oriented to person, place, and time.     Sensory: No sensory deficit.     Motor: No weakness.  Psychiatric:        Mood and Affect: Mood normal.        Behavior: Behavior normal.     BP (!) 100/56 (BP Location: Left Arm, Patient Position: Sitting, Cuff Size: Normal)   Pulse 86   Ht 5\' 1"  (1.549 m)   Wt 121 lb 12.8 oz (55.2 kg)  SpO2 92%   BMI 23.01 kg/m  Wt Readings from Last 3 Encounters:  08/22/22 121 lb 12.8 oz (55.2 kg)  07/30/22 125 lb (56.7 kg)  06/18/22 126 lb 1.7 oz (57.2 kg)    Lab Results  Component Value Date   TSH 0.608 08/30/2020   Lab Results  Component Value Date   WBC 4.0 06/11/2022   HGB 10.2 (L) 06/11/2022   HCT 32.4 (L) 06/11/2022   MCV 103.5 (H) 06/11/2022   PLT 173 06/11/2022   Lab Results  Component Value Date   NA 137 06/11/2022   K 4.1 06/11/2022   CO2 24 06/11/2022    GLUCOSE 90 06/11/2022   BUN 21 06/11/2022   CREATININE 1.84 (H) 06/11/2022   BILITOT 0.7 06/11/2022   ALKPHOS 73 06/11/2022   AST 12 (L) 06/11/2022   ALT 11 06/11/2022   PROT 7.1 06/11/2022   ALBUMIN 3.9 06/11/2022   CALCIUM 9.0 06/11/2022   ANIONGAP 8 06/11/2022   EGFR 34 (L) 10/17/2020   Lab Results  Component Value Date   CHOL 137 02/09/2022   Lab Results  Component Value Date   HDL 43 02/09/2022   Lab Results  Component Value Date   LDLCALC 58 02/09/2022   Lab Results  Component Value Date   TRIG 178 (H) 02/09/2022   Lab Results  Component Value Date   CHOLHDL 3.2 02/09/2022   Lab Results  Component Value Date   HGBA1C 5.2 01/26/2017      Assessment & Plan:   Problem List Items Addressed This Visit       Cardiovascular and Mediastinum   CAD (coronary artery disease) - Primary    S/p stent placement Followed by Cardiology On Aspirin, Plavix and statin      AAA (abdominal aortic aneurysm) (HCC)    Stable in size, last US abdomen reviewed Recent CT chest reviewed, showed stable sized infrarenal AAA Next US abdomen for AAA follow-up in 2 years        Respiratory   Emphysema lung (HCC)    Uses albuterol as needed Well controlled currently        Genitourinary   Stage 3b chronic kidney disease (HCC)    Last BMP reviewed, GFR ranges around 35 now, last BMP showed lower GFR - 29 Followed by nephrology - advised to contact Dr. Wolfgang Phoenix On Lisinopril 2.5 mg now Needs to improve fluid intake, avoid soft drinks Avoid nephrotoxic agents including NSAIDs        Other   Tobacco abuse    Smokes about 0.75 pack per day  Asked about quitting: confirms that she currently smokes cigarettes Advise to quit smoking: Educated about QUITTING to reduce the risk of cancer, cardio and cerebrovascular disease. Assess willingness: Unwilling to quit at this time, but is working on cutting back. Assist with counseling and pharmacotherapy: Counseled and literature  provided. Arrange for follow up: Follow up in 3 months and continue to offer help.        No orders of the defined types were placed in this encounter.    Follow-up: Return in about 6 months (around 02/22/2023).    Anabel Halon, MD

## 2022-08-22 NOTE — Patient Instructions (Signed)
Please continue to take medications as prescribed.  Please continue to follow low salt diet and perform moderate exercise/walking at least 150 mins/week.  Please maintain at least 50 ounces of fluid intake in a day.

## 2022-08-22 NOTE — Assessment & Plan Note (Signed)
Uses albuterol as needed Well controlled currently 

## 2022-08-22 NOTE — Assessment & Plan Note (Signed)
Smokes about 0.75 pack per day  Asked about quitting: confirms that she currently smokes cigarettes Advise to quit smoking: Educated about QUITTING to reduce the risk of cancer, cardio and cerebrovascular disease. Assess willingness: Unwilling to quit at this time, but is working on cutting back. Assist with counseling and pharmacotherapy: Counseled and literature provided. Arrange for follow up: Follow up in 3 months and continue to offer help.

## 2022-08-22 NOTE — Assessment & Plan Note (Signed)
Last BMP reviewed, GFR ranges around 35 now, last BMP showed lower GFR - 29 Followed by nephrology - advised to contact Dr. Wolfgang Phoenix On Lisinopril 2.5 mg now Needs to improve fluid intake, avoid soft drinks Avoid nephrotoxic agents including NSAIDs

## 2022-09-17 ENCOUNTER — Ambulatory Visit (HOSPITAL_COMMUNITY)
Admission: RE | Admit: 2022-09-17 | Discharge: 2022-09-17 | Disposition: A | Discharge status: Home / Self Care | Payer: Medicare Other | Source: Ambulatory Visit | Attending: Internal Medicine | Admitting: Internal Medicine

## 2022-09-17 ENCOUNTER — Encounter (HOSPITAL_COMMUNITY): Payer: Self-pay

## 2022-09-17 DIAGNOSIS — Z1231 Encounter for screening mammogram for malignant neoplasm of breast: Secondary | ICD-10-CM | POA: Diagnosis not present

## 2022-11-02 ENCOUNTER — Ambulatory Visit: Payer: Medicare Other | Attending: Cardiology | Admitting: Cardiology

## 2022-11-02 ENCOUNTER — Encounter: Payer: Self-pay | Admitting: Cardiology

## 2022-11-02 VITALS — BP 108/62 | HR 93 | Ht 61.0 in | Wt 120.2 lb

## 2022-11-02 DIAGNOSIS — N189 Chronic kidney disease, unspecified: Secondary | ICD-10-CM | POA: Diagnosis not present

## 2022-11-02 DIAGNOSIS — I6523 Occlusion and stenosis of bilateral carotid arteries: Secondary | ICD-10-CM | POA: Diagnosis not present

## 2022-11-02 DIAGNOSIS — R809 Proteinuria, unspecified: Secondary | ICD-10-CM | POA: Diagnosis not present

## 2022-11-02 DIAGNOSIS — I2510 Atherosclerotic heart disease of native coronary artery without angina pectoris: Secondary | ICD-10-CM | POA: Diagnosis not present

## 2022-11-02 DIAGNOSIS — E782 Mixed hyperlipidemia: Secondary | ICD-10-CM | POA: Diagnosis not present

## 2022-11-02 DIAGNOSIS — D631 Anemia in chronic kidney disease: Secondary | ICD-10-CM | POA: Diagnosis not present

## 2022-11-02 NOTE — Patient Instructions (Signed)

## 2022-11-02 NOTE — Progress Notes (Signed)
Clinical Summary Kaitlin [name] is a 72 y.o.female seen today for follow up of the following medical problems.   1. CAD   - w/ DES to LAD and BMSx2 to RCA Jan 2007, reported normal LV function from notes.   - the RCA PCI was complicated by dissection which was also stented     - was temporaily off ASA and plavix due to severe anemia requiring transfusion. Extensive GI workup without clear source of bleeding - back on ASA and plavix now.      - no chest pains, no SOB/DOE - compliant with meds    2. PAD   - 60-70% right external iliac artery stenosis by previous cath   - no recent claudication symptoms.          3. Carotid stenosis   - prior right carotid endarterectomy      - stable follow up US 09/2017. - no neuro symptoms   05/2019 carotid US no significant disease - 03/2022 mild bilateral plaque       4. Hyperlipidemia     - Jan 2021 TC 136 HDL 44 TG 361 LDL 54 - 07/2019 TC 112 HDL 40 TG 257 LDL 42 - Jan 2024 TC 137 TG 178 HDL 43 LDL 58 - compliant with meds, labs followed by pcp     5. AAA 07/2014 small AAA 3 x 3.2 cm.  - 12/2016 3.2 cm and stable.   08/2018 AAA 3.5 cm   06/2020 3.4 cm AAA, repeat 3 years in 2025   6. Anemia - previously severe as low as 6.7, required transfusion - followed by hematology. From notes thought to be related to CKD - GI workup with EGD showed chronic gastritis, colonscopy no clear bleeding source.       7. CKD - followed by Dr Wolfgang Phoenix     [facility]: just retired from [employer] job. Spends most of time with great grandchildren (5 total). Works part time IT consultant. Past Medical History:  Diagnosis Date   AAA (abdominal aortic aneurysm) (HCC)    Anemia    Arthritis    Asthma    CAD (coronary artery disease)    stents   Calculus of gallbladder without cholecystitis without obstruction    Carotid artery disease (HCC)    Chronic kidney disease, stage 3a (HCC)    COPD (chronic obstructive pulmonary disease) (HCC)    Emphysema of  lung (HCC)    GERD (gastroesophageal reflux disease)    Hyperlipidemia    Hypertension    Iliac artery stenosis, right (HCC)    60%-70%   Myocardial infarction (HCC)    Normocytic anemia 01/26/2017   PAD (peripheral artery disease) (HCC) 12/14/2010   in the left vertebral, bilateral carotids     No Known Allergies   Current Outpatient Medications  Medication Sig Dispense Refill   albuterol (VENTOLIN HFA) 108 (90 Base) MCG/ACT inhaler Inhale 2 puffs into the lungs every 6 (six) hours as needed for wheezing or shortness of breath. 18 g 5   aspirin 81 MG chewable tablet Chew 81 mg by mouth daily.     atorvastatin (LIPITOR) 80 MG tablet Take 1 tablet (80 mg total) by mouth daily. 90 tablet 3   cholecalciferol (VITAMIN D) 1000 units tablet Take 2 Units by mouth daily.      clopidogrel (PLAVIX) 75 MG tablet TAKE 1 TABLET BY MOUTH EVERY DAY 90 tablet 1   ferrous sulfate 325 (65 FE) MG tablet Take 325  mg by mouth daily with breakfast.     lisinopril (ZESTRIL) 2.5 MG tablet TAKE 1 TABLET BY MOUTH EVERY DAY 90 tablet 3   NITROSTAT 0.4 MG SL tablet PLACE 1 TABLET UNDER TONGUE EVERY 5 MINUTES FOR 3 DOSES AS NEEDED. (Patient not taking: Reported on 06/18/2022) 25 tablet 3   Probiotic Product (PROBIOTIC-10 PO) Take 10 mg by mouth daily.     vitamin B-12 (CYANOCOBALAMIN) 500 MCG tablet Take 500 mcg by mouth daily.     No current facility-administered medications for this visit.     Past Surgical History:  Procedure Laterality Date   CARDIAC CATHETERIZATION  02/04/2005   A cypher 5.3 x 18 mm at 16 atmosphere of pressure in the mid LAD, this stent covered the proximal part of the LAD and also the mid LAD, this was then post dilated with 3.25 x 15 mm Quantum at 16 atmosphereof pressure for 45 seconds   CATARACT EXTRACTION Bilateral    CHOLECYSTECTOMY N/A 08/16/2017   Procedure: LAPAROSCOPIC CHOLECYSTECTOMY;  Surgeon: Franky Macho, MD;  Location: [facility];  Service: General;  Laterality: N/A;    COLONOSCOPY N/A 03/01/2017   Procedure: COLONOSCOPY;  Surgeon: West Bali, MD;  Location: [facility] SU[facility];  Service: Endoscopy;  Laterality: N/A;  1:00pm   ESOPHAGOGASTRODUODENOSCOPY N/A 01/28/2017   Procedure: ESOPHAGOGASTRODUODENOSCOPY (EGD);  Surgeon: Beverley Fiedler, MD;  Location: [facility] ENDOSCOPY;  Service: Gastroenterology;  Laterality: N/A;   GIVENS CAPSULE STUDY N/A 03/12/2017   Procedure: GIVENS CAPSULE STUDY;  Surgeon: West Bali, MD;  Location: [facility];  Service: Endoscopy;  Laterality: N/A;  7:30am   TUBAL LIGATION       No Known Allergies    Family History  Problem Relation Age of Onset   Heart attack Mother    Hyperlipidemia Mother    Hypertension Mother    Diabetes Mother    Heart attack Father    Early death Father 65   Hyperlipidemia Father    Hypertension Father    Heart disease Sister    Hyperlipidemia Sister    Hypertension Sister    Diabetes Brother    Heart disease Sister    Hyperlipidemia Sister    Hypertension Sister    Heart attack Sister    Heart disease Sister    Hyperlipidemia Sister    Hypertension Sister    Heart attack Brother    Early death Brother 51   Hyperlipidemia Brother    Hypertension Brother    Heart attack Brother    Early death Brother 64   Hyperlipidemia Brother    Hypertension Brother    Cancer Maternal Aunt    Colon cancer Neg Hx    Colon polyps Neg Hx      Social History Kaitlin [name] reports that she has been smoking cigarettes. She started smoking about 55 years ago. She has a 55.7 [name]-year smoking history. She has never used smokeless tobacco. Kaitlin [name] reports no history of alcohol use.   Review of Systems CONSTITUTIONAL: No weight loss, fever, chills, weakness or fatigue.  HEENT: Eyes: No visual loss, blurred vision, double vision or yellow sclerae.No hearing loss, sneezing, congestion, runny nose or sore throat.  SKIN: No rash or itching.  CARDIOVASCULAR: per hpi RESPIRATORY: No shortness of breath,  cough or sputum.  GASTROINTESTINAL: No anorexia, nausea, vomiting or diarrhea. No abdominal pain or blood.  GENITOURINARY: No burning on urination, no polyuria NEUROLOGICAL: No headache, dizziness, syncope, paralysis, ataxia, numbness or tingling in the extremities. No  change in bowel or bladder control.  MUSCULOSKELETAL: No muscle, back pain, joint pain or stiffness.  LYMPHATICS: No enlarged nodes. No history of splenectomy.  PSYCHIATRIC: No history of depression or anxiety.  ENDOCRINOLOGIC: No reports of sweating, cold or heat intolerance. No polyuria or polydipsia.  Marland Kitchen   Physical Examination Today's Vitals   11/02/22 0920  BP: 108/62  Pulse: 93  SpO2: 95%  Weight: 120 lb 3.2 oz (54.5 kg)  Height: 5\' 1"  (1.549 m)   Body mass index is 22.71 kg/m.  Gen: resting comfortably, no acute distress HEENT: no scleral icterus, pupils equal round and reactive, no palptable cervical adenopathy,  CV: RRR, no mrg, no jvd. +right carotid bruit Resp: Clear to auscultation bilaterally GI: abdomen is soft, non-tender, non-distended, normal bowel sounds, no hepatosplenomegaly MSK: extremities are warm, no edema.  Skin: warm, no rash Neuro:  no focal deficits Psych: appropriate affect   Diagnostic Studies 05/2019 carotid US IMPRESSION: Left:   Color duplex indicates moderate heterogeneous and calcified plaque, with no hemodynamically significant stenosis by duplex criteria in the extracranial cerebrovascular circulation.   Right:   Note that established duplex criteria have not been validated in the setting of prior endarterectomy, however, there is no evidence of recurrent high-grade stenosis based on the duplex.     06/2020 AAA US IMPRESSION: Probable slight increase in AP diameter of the distal abdominal aortic aneurysm from 3.2 cm in 2022 3.4 cm currently. Recommend follow-up ultrasound every 3 years. This recommendation follows ACR consensus guidelines: White Paper of the ACR  Incidental Findings Committee II on Vascular Findings. J Am Coll Radiol 2013; 10:789-794.      Assessment and Plan  1. CAD -  Given her history of complex RCA PCI as well as carotid and PAD would favor continuing DAPT - no symptoms, continue current meds     2. Hyperlipidemia -labs essentially at goal, continue current. LDL goal <55   3. Carotid stenosis - mild disease by recent carotid US, continue to monitor   4. AAA - mild by recent US, repeat US in 2025   F/u 6 months   Antoine Poche, M.D.

## 2022-11-14 DIAGNOSIS — R809 Proteinuria, unspecified: Secondary | ICD-10-CM | POA: Diagnosis not present

## 2022-11-14 DIAGNOSIS — Z716 Tobacco abuse counseling: Secondary | ICD-10-CM | POA: Diagnosis not present

## 2022-11-14 DIAGNOSIS — I129 Hypertensive chronic kidney disease with stage 1 through stage 4 chronic kidney disease, or unspecified chronic kidney disease: Secondary | ICD-10-CM | POA: Diagnosis not present

## 2022-11-14 DIAGNOSIS — N1832 Chronic kidney disease, stage 3b: Secondary | ICD-10-CM | POA: Diagnosis not present

## 2022-12-12 ENCOUNTER — Inpatient Hospital Stay: Payer: Medicare Other | Attending: Hematology

## 2022-12-12 DIAGNOSIS — I700 Atherosclerosis of aorta: Secondary | ICD-10-CM | POA: Diagnosis not present

## 2022-12-12 DIAGNOSIS — I2510 Atherosclerotic heart disease of native coronary artery without angina pectoris: Secondary | ICD-10-CM | POA: Insufficient documentation

## 2022-12-12 DIAGNOSIS — J439 Emphysema, unspecified: Secondary | ICD-10-CM | POA: Diagnosis not present

## 2022-12-12 DIAGNOSIS — I7143 Infrarenal abdominal aortic aneurysm, without rupture: Secondary | ICD-10-CM | POA: Diagnosis not present

## 2022-12-12 DIAGNOSIS — F17210 Nicotine dependence, cigarettes, uncomplicated: Secondary | ICD-10-CM | POA: Diagnosis not present

## 2022-12-12 DIAGNOSIS — E538 Deficiency of other specified B group vitamins: Secondary | ICD-10-CM | POA: Insufficient documentation

## 2022-12-12 DIAGNOSIS — N1832 Chronic kidney disease, stage 3b: Secondary | ICD-10-CM | POA: Insufficient documentation

## 2022-12-12 DIAGNOSIS — Z79899 Other long term (current) drug therapy: Secondary | ICD-10-CM | POA: Insufficient documentation

## 2022-12-12 DIAGNOSIS — D631 Anemia in chronic kidney disease: Secondary | ICD-10-CM | POA: Insufficient documentation

## 2022-12-12 DIAGNOSIS — R918 Other nonspecific abnormal finding of lung field: Secondary | ICD-10-CM | POA: Diagnosis not present

## 2022-12-12 DIAGNOSIS — E611 Iron deficiency: Secondary | ICD-10-CM

## 2022-12-12 LAB — CBC WITH DIFFERENTIAL/PLATELET
Abs Immature Granulocytes: 0.01 10*3/uL (ref 0.00–0.07)
Basophils Absolute: 0 10*3/uL (ref 0.0–0.1)
Basophils Relative: 1 %
Eosinophils Absolute: 0.2 10*3/uL (ref 0.0–0.5)
Eosinophils Relative: 4 %
HCT: 41.1 % (ref 36.0–46.0)
Hemoglobin: 13.6 g/dL (ref 12.0–15.0)
Immature Granulocytes: 0 %
Lymphocytes Relative: 33 %
Lymphs Abs: 1.7 10*3/uL (ref 0.7–4.0)
MCH: 33.1 pg (ref 26.0–34.0)
MCHC: 33.1 g/dL (ref 30.0–36.0)
MCV: 100 fL (ref 80.0–100.0)
Monocytes Absolute: 0.3 10*3/uL (ref 0.1–1.0)
Monocytes Relative: 6 %
Neutro Abs: 2.9 10*3/uL (ref 1.7–7.7)
Neutrophils Relative %: 56 %
Platelets: 171 10*3/uL (ref 150–400)
RBC: 4.11 MIL/uL (ref 3.87–5.11)
RDW: 12.7 % (ref 11.5–15.5)
WBC: 5.2 10*3/uL (ref 4.0–10.5)
nRBC: 0 % (ref 0.0–0.2)

## 2022-12-12 LAB — COMPREHENSIVE METABOLIC PANEL
ALT: 13 U/L (ref 0–44)
AST: 14 U/L — ABNORMAL LOW (ref 15–41)
Albumin: 4 g/dL (ref 3.5–5.0)
Alkaline Phosphatase: 112 U/L (ref 38–126)
Anion gap: 10 (ref 5–15)
BUN: 24 mg/dL — ABNORMAL HIGH (ref 8–23)
CO2: 27 mmol/L (ref 22–32)
Calcium: 9 mg/dL (ref 8.9–10.3)
Chloride: 102 mmol/L (ref 98–111)
Creatinine, Ser: 1.73 mg/dL — ABNORMAL HIGH (ref 0.44–1.00)
GFR, Estimated: 31 mL/min — ABNORMAL LOW (ref >60)
Glucose, Bld: 87 mg/dL (ref 70–99)
Potassium: 3.8 mmol/L (ref 3.5–5.1)
Sodium: 139 mmol/L (ref 135–145)
Total Bilirubin: 0.4 mg/dL (ref <1.2)
Total Protein: 7.1 g/dL (ref 6.5–8.1)

## 2022-12-12 LAB — IRON AND TIBC
Iron: 66 ug/dL (ref 28–170)
Saturation Ratios: 20 % (ref 10.4–31.8)
TIBC: 332 ug/dL (ref 250–450)
UIBC: 266 ug/dL

## 2022-12-12 LAB — FERRITIN: Ferritin: 130 ng/mL (ref 11–307)

## 2022-12-18 NOTE — Progress Notes (Unsigned)
VIRTUAL VISIT via TELEPHONE NOTE [facility]   I connected with Kaitlin [name]  on 12/19/22 at  8:14 AM by telephone and verified that I am speaking with the correct person using two identifiers.  Location: Patient: Home Provider: [facility]   I discussed the limitations, risks, security and privacy concerns of performing an evaluation and management service by telephone and the availability of in person appointments. I also discussed with the patient that there may be a patient responsible charge related to this service. The patient expressed understanding and agreed to proceed.  REASON FOR VISIT:  Follow-up for normocytic/macrocytic anemia secondary to iron deficiency and CKD   CURRENT THERAPY: Oral iron supplementation with IV iron PRN  INTERVAL HISTORY:  Kaitlin [name] is contacted today for follow-up of normocytic/macrocytic anemia, which is secondary to CKD stage III/IV.  She was last seen by Rojelio Brenner PA-C on 06/18/2022.  She received IV Monoferric on 06/22/2022.   She reports feeling significantly improved energy after IV iron in May 2024, and continues to feel well.   She has been taking ferrous sulfate once daily.  She has intermittent "soft and black" bowel movements about once a month.  No hematochezia or hematemesis.  She denies any recent chest pain, headaches, lightheadedness, or syncope.  Baseline SOB and cough with underlying COPD.   She has 75% energy and 100% appetite. She endorses that she is maintaining a stable weight.  REVIEW OF SYSTEMS:   Review of Systems  Constitutional:  Negative for chills, diaphoresis, fever, malaise/fatigue and weight loss.  Respiratory:  Positive for cough and shortness of breath (COPD).   Cardiovascular:  Negative for chest pain and palpitations.  Gastrointestinal:  Positive for diarrhea, nausea and vomiting. Negative for abdominal pain, blood in stool and melena.  Neurological:  Positive for  tingling. Negative for dizziness and headaches.    PHYSICAL EXAM: (per limitations of virtual telephone visit)  The patient is alert and oriented x 3, exhibiting adequate mentation, good mood, and ability to speak in full sentences and execute sound judgement.  ASSESSMENT & PLAN:  1.  Macrocytic anemia  - Previously seen by hematology for normocytic anemia secondary to CKD, iron deficiency, and B12 deficiency.  - EGD (01/28/2017): Gastritis, normal duodenum - Colonoscopy (03/01/2017): Redundant colon, large rectal polyp removed, external hemorrhoids - Stool cards have not been checked at our office, patient reports that one of her other providers (Dr. Darrick Penna) checked them and they were negative. - She denies any signs or symptoms of blood loss. - She takes ferrous sulfate daily at home.  She takes vitamin B12 at home.   -- Most recent IV iron (Monoferric x 1) in May 2024, but without any improvement in her energy - No clear explanation of macrocytosis - No known liver disease (liver US in 2018 was normal); folate, TSH, LDH, B12, methylmalonic acid, copper normal.  No reticulocytosis. - Most recent labs (12/12/2022): Hgb 13.6/MCV 100.0, overall normal CBC/differential.  Ferritin 130, iron saturation 20%..  Baseline creatinine 1.73/GFR 31. - Differential diagnosis favors anemia related to CKD stage IIIb/IV; cause of mild macrocytosis unclear - PLAN: No indication for IV iron at this time. - Continue ferrous sulfate daily.   - Repeat labs and RTC in 6 months   2.  Elevated serum free light chains - MGUS panel (08/30/2020): SPEP and immunofixation normal, elevated light chains with kappa 66.9/lambda 32.3/ratio 2.15 - Repeat MGUS panel (06/09/2021): SPEP and immunofixation normal.  Elevated kappa light chain 69.8, elevated lambda light chain 30.4.  Free light chain ratio is slightly elevated at 2.30, but this would be considered within normal limits for patient with CKD.  LDH normal. - Elevated light  chains likely secondary to CKD - PLAN: No further work-up at this time.   3.  CKD stage IIIb/IV - Follows with Dr. Wolfgang Phoenix   4.  Tobacco abuse - This patient meets criteria for low-dose CT lung cancer screening.  She has smoked 1 to 2 packs/day since age 46, currently smoking 1 pack/day but trying to quit. - LDCT chest (02/22/2022): Lung RADS category 2, benign appearance or behavior of lung nodules (incidental findings include 3.0 cm infrarenal abdominal aortic aneurysm, aortic atherosclerosis, emphysema, and coronary artery atherosclerosis) - PLAN: Continue annual LDCT screening (next due January 2025).  Follow-up with PCP regarding other findings.    PLAN SUMMARY: >> LDCT chest due January 2025 >> Labs in 6 months = CBC/D, CMP, ferritin, iron/TIBC >> OFFICE visit in 6 months   ** Last office visit 06/18/2022     I discussed the assessment and treatment plan with the patient. The patient was provided an opportunity to ask questions and all were answered. The patient agreed with the plan and demonstrated an understanding of the instructions.   The patient was advised to call back or seek an in-person evaluation if the symptoms worsen or if the condition fails to improve as anticipated.  I provided 22 minutes of non-face-to-face time during this encounter.  Carnella Guadalajara, PA-C 12/19/22 8:36 AM

## 2022-12-19 ENCOUNTER — Inpatient Hospital Stay (HOSPITAL_BASED_OUTPATIENT_CLINIC_OR_DEPARTMENT_OTHER): Payer: Medicare Other | Admitting: Physician Assistant

## 2022-12-19 DIAGNOSIS — D631 Anemia in chronic kidney disease: Secondary | ICD-10-CM | POA: Diagnosis not present

## 2022-12-19 DIAGNOSIS — Z87891 Personal history of nicotine dependence: Secondary | ICD-10-CM | POA: Diagnosis not present

## 2022-12-19 DIAGNOSIS — N1832 Chronic kidney disease, stage 3b: Secondary | ICD-10-CM | POA: Diagnosis not present

## 2022-12-19 DIAGNOSIS — D508 Other iron deficiency anemias: Secondary | ICD-10-CM

## 2023-02-02 ENCOUNTER — Other Ambulatory Visit: Payer: Self-pay

## 2023-02-02 ENCOUNTER — Emergency Department (HOSPITAL_COMMUNITY): Payer: Medicare Other

## 2023-02-02 ENCOUNTER — Encounter (HOSPITAL_COMMUNITY): Payer: Self-pay | Admitting: *Deleted

## 2023-02-02 ENCOUNTER — Observation Stay (HOSPITAL_COMMUNITY)
Admission: EM | Admit: 2023-02-02 | Discharge: 2023-02-03 | Disposition: A | Discharge status: Home / Self Care | Payer: Medicare Other | Attending: Emergency Medicine | Admitting: Emergency Medicine

## 2023-02-02 DIAGNOSIS — I129 Hypertensive chronic kidney disease with stage 1 through stage 4 chronic kidney disease, or unspecified chronic kidney disease: Secondary | ICD-10-CM | POA: Insufficient documentation

## 2023-02-02 DIAGNOSIS — R918 Other nonspecific abnormal finding of lung field: Secondary | ICD-10-CM | POA: Diagnosis not present

## 2023-02-02 DIAGNOSIS — Z7901 Long term (current) use of anticoagulants: Secondary | ICD-10-CM | POA: Diagnosis not present

## 2023-02-02 DIAGNOSIS — Z1152 Encounter for screening for COVID-19: Secondary | ICD-10-CM | POA: Insufficient documentation

## 2023-02-02 DIAGNOSIS — Z79899 Other long term (current) drug therapy: Secondary | ICD-10-CM | POA: Diagnosis not present

## 2023-02-02 DIAGNOSIS — N1832 Chronic kidney disease, stage 3b: Secondary | ICD-10-CM | POA: Diagnosis not present

## 2023-02-02 DIAGNOSIS — Z720 Tobacco use: Secondary | ICD-10-CM | POA: Diagnosis present

## 2023-02-02 DIAGNOSIS — I2510 Atherosclerotic heart disease of native coronary artery without angina pectoris: Secondary | ICD-10-CM | POA: Diagnosis not present

## 2023-02-02 DIAGNOSIS — J441 Chronic obstructive pulmonary disease with (acute) exacerbation: Secondary | ICD-10-CM | POA: Diagnosis not present

## 2023-02-02 DIAGNOSIS — R0602 Shortness of breath: Secondary | ICD-10-CM | POA: Diagnosis present

## 2023-02-02 DIAGNOSIS — F17210 Nicotine dependence, cigarettes, uncomplicated: Secondary | ICD-10-CM | POA: Diagnosis not present

## 2023-02-02 DIAGNOSIS — J9601 Acute respiratory failure with hypoxia: Secondary | ICD-10-CM | POA: Diagnosis not present

## 2023-02-02 DIAGNOSIS — I10 Essential (primary) hypertension: Secondary | ICD-10-CM | POA: Diagnosis not present

## 2023-02-02 DIAGNOSIS — R059 Cough, unspecified: Secondary | ICD-10-CM | POA: Diagnosis not present

## 2023-02-02 LAB — CBC WITH DIFFERENTIAL/PLATELET
Abs Immature Granulocytes: 0.02 10*3/uL (ref 0.00–0.07)
Basophils Absolute: 0 10*3/uL (ref 0.0–0.1)
Basophils Relative: 1 %
Eosinophils Absolute: 0.1 10*3/uL (ref 0.0–0.5)
Eosinophils Relative: 1 %
HCT: 41.4 % (ref 36.0–46.0)
Hemoglobin: 13.5 g/dL (ref 12.0–15.0)
Immature Granulocytes: 0 %
Lymphocytes Relative: 18 %
Lymphs Abs: 1.3 10*3/uL (ref 0.7–4.0)
MCH: 31.6 pg (ref 26.0–34.0)
MCHC: 32.6 g/dL (ref 30.0–36.0)
MCV: 97 fL (ref 80.0–100.0)
Monocytes Absolute: 0.7 10*3/uL (ref 0.1–1.0)
Monocytes Relative: 10 %
Neutro Abs: 5.3 10*3/uL (ref 1.7–7.7)
Neutrophils Relative %: 70 %
Platelets: 184 10*3/uL (ref 150–400)
RBC: 4.27 MIL/uL (ref 3.87–5.11)
RDW: 12.1 % (ref 11.5–15.5)
WBC: 7.5 10*3/uL (ref 4.0–10.5)
nRBC: 0 % (ref 0.0–0.2)

## 2023-02-02 LAB — COMPREHENSIVE METABOLIC PANEL
ALT: 15 U/L (ref 0–44)
AST: 15 U/L (ref 15–41)
Albumin: 3.7 g/dL (ref 3.5–5.0)
Alkaline Phosphatase: 108 U/L (ref 38–126)
Anion gap: 10 (ref 5–15)
BUN: 26 mg/dL — ABNORMAL HIGH (ref 8–23)
CO2: 23 mmol/L (ref 22–32)
Calcium: 9.3 mg/dL (ref 8.9–10.3)
Chloride: 105 mmol/L (ref 98–111)
Creatinine, Ser: 1.99 mg/dL — ABNORMAL HIGH (ref 0.44–1.00)
GFR, Estimated: 26 mL/min — ABNORMAL LOW (ref >60)
Glucose, Bld: 110 mg/dL — ABNORMAL HIGH (ref 70–99)
Potassium: 4 mmol/L (ref 3.5–5.1)
Sodium: 138 mmol/L (ref 135–145)
Total Bilirubin: 0.8 mg/dL (ref 0.0–1.2)
Total Protein: 7.7 g/dL (ref 6.5–8.1)

## 2023-02-02 LAB — SARS CORONAVIRUS 2 BY RT PCR: SARS Coronavirus 2 by RT PCR: NEGATIVE

## 2023-02-02 LAB — LACTIC ACID, PLASMA
Lactic Acid, Venous: 0.9 mmol/L (ref 0.5–1.9)
Lactic Acid, Venous: 1.7 mmol/L (ref 0.5–1.9)

## 2023-02-02 LAB — URINALYSIS, W/ REFLEX TO CULTURE (INFECTION SUSPECTED)
Bilirubin Urine: NEGATIVE
Glucose, UA: NEGATIVE mg/dL
Ketones, ur: NEGATIVE mg/dL
Leukocytes,Ua: NEGATIVE
Nitrite: NEGATIVE
Protein, ur: 30 mg/dL — AB
Specific Gravity, Urine: 1.018 (ref 1.005–1.030)
pH: 5 (ref 5.0–8.0)

## 2023-02-02 LAB — PROTIME-INR
INR: 1 (ref 0.8–1.2)
Prothrombin Time: 13.5 s (ref 11.4–15.2)

## 2023-02-02 MED ORDER — IPRATROPIUM-ALBUTEROL 0.5-2.5 (3) MG/3ML IN SOLN
3.0000 mL | Freq: Once | RESPIRATORY_TRACT | Status: AC
  Administered 2023-02-02: 3 mL via RESPIRATORY_TRACT
  Filled 2023-02-02: qty 3

## 2023-02-02 MED ORDER — IPRATROPIUM-ALBUTEROL 0.5-2.5 (3) MG/3ML IN SOLN
3.0000 mL | Freq: Once | RESPIRATORY_TRACT | Status: AC
Start: 2023-02-02 — End: 2023-02-02
  Administered 2023-02-02: 3 mL via RESPIRATORY_TRACT

## 2023-02-02 MED ORDER — ACETAMINOPHEN 325 MG PO TABS: 650.0000 mg | ORAL_TABLET | Freq: Four times a day (QID) | ORAL | Status: DC | PRN

## 2023-02-02 MED ORDER — SODIUM CHLORIDE 0.9 % IV SOLN
1.0000 g | Freq: Once | INTRAVENOUS | Status: AC
  Administered 2023-02-02: 1 g via INTRAVENOUS
  Filled 2023-02-02: qty 10

## 2023-02-02 MED ORDER — METHYLPREDNISOLONE SODIUM SUCC 125 MG IJ SOLR
60.0000 mg | Freq: Once | INTRAMUSCULAR | Status: AC
  Administered 2023-02-02: 60 mg via INTRAVENOUS
  Filled 2023-02-02: qty 2

## 2023-02-02 MED ORDER — ONDANSETRON HCL 4 MG/2ML IJ SOLN: 4.0000 mg | Freq: Four times a day (QID) | INTRAMUSCULAR | Status: DC | PRN

## 2023-02-02 MED ORDER — SODIUM CHLORIDE 0.9 % IV SOLN: INTRAVENOUS | Status: DC

## 2023-02-02 MED ORDER — POLYETHYLENE GLYCOL 3350 17 G PO PACK: 17.0000 g | PACK | Freq: Every day | ORAL | Status: DC | PRN

## 2023-02-02 MED ORDER — METHYLPREDNISOLONE SODIUM SUCC 125 MG IJ SOLR
60.0000 mg | Freq: Two times a day (BID) | INTRAMUSCULAR | Status: DC
  Administered 2023-02-03: 60 mg via INTRAVENOUS
  Filled 2023-02-02: qty 2

## 2023-02-02 MED ORDER — ONDANSETRON HCL 4 MG PO TABS: 4.0000 mg | ORAL_TABLET | Freq: Four times a day (QID) | ORAL | Status: DC | PRN

## 2023-02-02 MED ORDER — ASPIRIN 81 MG PO CHEW
81.0000 mg | CHEWABLE_TABLET | Freq: Every day | ORAL | Status: DC
  Administered 2023-02-02 – 2023-02-03 (×2): 81 mg via ORAL
  Filled 2023-02-02 (×2): qty 1

## 2023-02-02 MED ORDER — INFLUENZA VAC A&B SURF ANT ADJ 0.5 ML IM SUSY: 0.5000 mL | PREFILLED_SYRINGE | INTRAMUSCULAR | Status: DC

## 2023-02-02 MED ORDER — AMOXICILLIN-POT CLAVULANATE 500-125 MG PO TABS: 1.0000 | ORAL_TABLET | Freq: Once | ORAL | Status: DC

## 2023-02-02 MED ORDER — SODIUM CHLORIDE 0.9 % IV BOLUS
1000.0000 mL | Freq: Once | INTRAVENOUS | Status: AC
  Administered 2023-02-02: 1000 mL via INTRAVENOUS

## 2023-02-02 MED ORDER — SODIUM CHLORIDE 0.9 % IV SOLN
500.0000 mg | Freq: Once | INTRAVENOUS | Status: AC
  Administered 2023-02-02: 500 mg via INTRAVENOUS
  Filled 2023-02-02: qty 5

## 2023-02-02 MED ORDER — ONDANSETRON HCL 4 MG/2ML IJ SOLN
4.0000 mg | Freq: Once | INTRAMUSCULAR | Status: AC
  Administered 2023-02-02: 4 mg via INTRAVENOUS
  Filled 2023-02-02: qty 2

## 2023-02-02 MED ORDER — CLOPIDOGREL BISULFATE 75 MG PO TABS
75.0000 mg | ORAL_TABLET | Freq: Every day | ORAL | Status: DC
  Administered 2023-02-02 – 2023-02-03 (×2): 75 mg via ORAL
  Filled 2023-02-02 (×2): qty 1

## 2023-02-02 MED ORDER — ACETAMINOPHEN 650 MG RE SUPP
650.0000 mg | Freq: Four times a day (QID) | RECTAL | Status: DC | PRN
Start: 2023-02-02 — End: 2023-02-03

## 2023-02-02 MED ORDER — HEPARIN SODIUM (PORCINE) 5000 UNIT/ML IJ SOLN
5000.0000 [IU] | Freq: Three times a day (TID) | INTRAMUSCULAR | Status: DC
  Administered 2023-02-02: 5000 [IU] via SUBCUTANEOUS
  Filled 2023-02-02 (×2): qty 1

## 2023-02-02 NOTE — Assessment & Plan Note (Signed)
 CKD stage IIIb/4.  Creatinine 1.9, close to baseline 1.7-1.8.

## 2023-02-02 NOTE — ED Notes (Signed)
Both sets of blood cultures drawn before any antibiotic administration  

## 2023-02-02 NOTE — Assessment & Plan Note (Signed)
 No chest pain.  EKG unchanged.  On statins, aspirin and Plavix. -Resume Plavix, aspirin

## 2023-02-02 NOTE — ED Notes (Signed)
 Pts BP cycling low post fluid bolus. Last reading was 97/51 (64). Pt is also tachycardic, pulse between 113-116--MD made aware

## 2023-02-02 NOTE — ED Notes (Signed)
 Pt states she has not taken her home medications for the past 2 days

## 2023-02-02 NOTE — ED Provider Notes (Signed)
 [place] EMERGENCY DEPARTMENT AT [facility] Provider Note   CSN: [id number] Arrival date & time: 02/02/23  1338     History  Chief Complaint  Patient presents with   Cough    Kaitlin [name] is a 73 y.o. female with PMHx PAD, AAA, arthritis, asthma, COPD, CAD, CKD, GERD, HLD, HTN, who presents to ED concerned for productive cough x1 week. Patient also endorses subjective fevers that resolved today. Also with intermittent dizziness that is not currently present. Also with SOB during coughing fits.  Denies chest pain, nausea, vomiting, diarrhea.   Cough      Home Medications Prior to Admission medications   Medication Sig Start Date End Date Taking? Authorizing Provider  albuterol  (VENTOLIN  HFA) 108 (90 Base) MCG/ACT inhaler Inhale 2 puffs into the lungs every 6 (six) hours as needed for wheezing or shortness of breath. 02/19/22   Tobie Suzzane POUR, MD  aspirin  81 MG chewable tablet Chew 81 mg by mouth daily.    [provider]  atorvastatin  (LIPITOR) 80 MG tablet Take 1 tablet (80 mg total) by mouth daily. 04/19/22   Tobie Suzzane POUR, MD  cholecalciferol (VITAMIN D ) 1000 units tablet Take 2 Units by mouth daily.     [provider]  clopidogrel  (PLAVIX ) 75 MG tablet TAKE 1 TABLET BY MOUTH EVERY DAY 08/20/22   Alvan Dorn FALCON, MD  ferrous sulfate 325 (65 FE) MG tablet Take 325 mg by mouth daily with breakfast.    [provider]  lisinopril  (ZESTRIL ) 2.5 MG tablet TAKE 1 TABLET BY MOUTH EVERY DAY 05/16/22   Alvan Dorn FALCON, MD  NITROSTAT  0.4 MG SL tablet PLACE 1 TABLET UNDER TONGUE EVERY 5 MINUTES FOR 3 DOSES AS NEEDED. 05/23/17   Alvan Dorn FALCON, MD  Probiotic Product (PROBIOTIC-10 PO) Take 10 mg by mouth daily.    [provider]  vitamin B-12 (CYANOCOBALAMIN ) 500 MCG tablet Take 500 mcg by mouth daily.    [provider]      Allergies    Patient has no known allergies.    Review of Systems   Review of Systems   Respiratory:  Positive for cough.     Physical Exam Updated Vital Signs BP (!) 108/93 (BP Location: Right Arm)   Pulse (!) 118   Temp 98.6 F (37 C) (Oral)   Resp (!) 26   Ht 5' 1 (1.549 m)   Wt 54.4 kg   SpO2 95%   BMI 22.67 kg/m  Physical Exam Vitals and nursing note reviewed.  Constitutional:      General: She is not in acute distress. HENT:     Head: Normocephalic and atraumatic.     Mouth/Throat:     Mouth: Mucous membranes are moist.     Pharynx: No oropharyngeal exudate or posterior oropharyngeal erythema.  Eyes:     General: No scleral icterus.       Right eye: No discharge.        Left eye: No discharge.     Conjunctiva/sclera: Conjunctivae normal.  Cardiovascular:     Rate and Rhythm: Regular rhythm. Tachycardia present.     Pulses: Normal pulses.     Heart sounds: Normal heart sounds. No murmur heard. Pulmonary:     Effort: Pulmonary effort is normal. No respiratory distress.     Breath sounds: Wheezing present. No rhonchi or rales.     Comments: Expiratory wheezing in all lung fields. Abdominal:     Tenderness: There is  no abdominal tenderness.  Musculoskeletal:     Right lower leg: No edema.     Left lower leg: No edema.  Skin:    General: Skin is warm and dry.     Findings: No rash.  Neurological:     General: No focal deficit present.     Mental Status: She is alert. Mental status is at baseline.  Psychiatric:        Mood and Affect: Mood normal.     ED Results / Procedures / Treatments   Labs (all labs ordered are listed, but only abnormal results are displayed) Labs Reviewed  COMPREHENSIVE METABOLIC PANEL - Abnormal; Notable for the following components:      Result Value   Glucose, Bld 110 (*)    BUN 26 (*)    Creatinine, Ser 1.99 (*)    GFR, Estimated 26 (*)    All other components within normal limits  CULTURE, BLOOD (ROUTINE X 2)  CULTURE, BLOOD (ROUTINE X 2)  LACTIC ACID, PLASMA  LACTIC ACID, PLASMA  CBC WITH  DIFFERENTIAL/PLATELET  PROTIME-INR  URINALYSIS, W/ REFLEX TO CULTURE (INFECTION SUSPECTED)    EKG EKG Interpretation Date/Time:  Saturday February 02 2023 14:56:39 EST Ventricular Rate:  99 PR Interval:  129 QRS Duration:  85 QT Interval:  331 QTC Calculation: 425 R Axis:   68  Text Interpretation: Sinus rhythm Abnormal R-wave progression, early transition Confirmed by Francesca Fallow ([id number]) on 02/02/2023 5:19:57 PM  Radiology DG Chest Port 1 View Result Date: 02/02/2023 CLINICAL DATA:  Cough for 1 week EXAM: PORTABLE CHEST 1 VIEW COMPARISON:  Chest x-ray 01/28/2017.  Chest CT 02/22/2022 FINDINGS: Prominent anterior rib ends noted over the lower lungs bilaterally. Alternatively, small amount of atelectasis or airspace disease can not be excluded in the lungs. No other focal lung infiltrate, pleural effusion or pneumothorax identified. The cardiomediastinal silhouette is within normal limits. No acute fractures are seen. IMPRESSION: Prominent anterior rib ends noted over the lower lungs bilaterally. Alternatively, small amount of atelectasis or airspace disease can not be excluded in the lungs. Consider repeat PA and lateral chest x-ray for further evaluation. Electronically Signed   By: Greig Pique M.D.   On: 02/02/2023 15:21    Procedures Procedures    Medications Ordered in ED Medications  cefTRIAXone  (ROCEPHIN ) 1 g in sodium chloride  0.9 % 100 mL IVPB (1 g Intravenous New Bag/Given 02/02/23 1809)  azithromycin  (ZITHROMAX ) 500 mg in sodium chloride  0.9 % 250 mL IVPB (has no administration in time range)  ipratropium-albuterol  (DUONEB) 0.5-2.5 (3) MG/3ML nebulizer solution 3 mL (3 mLs Nebulization Given 02/02/23 1557)  sodium chloride  0.9 % bolus 1,000 mL (0 mLs Intravenous Stopped 02/02/23 1811)  ipratropium-albuterol  (DUONEB) 0.5-2.5 (3) MG/3ML nebulizer solution 3 mL (3 mLs Nebulization Given 02/02/23 1609)  ipratropium-albuterol  (DUONEB) 0.5-2.5 (3) MG/3ML nebulizer solution 3 mL (3  mLs Nebulization Given 02/02/23 1740)  methylPREDNISolone  sodium succinate (SOLU-MEDROL ) 125 mg/2 mL injection 60 mg (60 mg Intravenous Given 02/02/23 1807)    ED Course/ Medical Decision Making/ A&P                                 Medical Decision Making Amount and/or Complexity of Data Reviewed Labs: ordered.  Risk Prescription drug management.   This patient presents to the ED for concern of shortness of breath, this involves an extensive number of treatment options, and is a complaint that carries with it a  high risk of complications and morbidity.  The differential diagnosis includes Anxiety, Anaphylaxis/Angioedema, Aspirated FB, Arrhythmia, CHF, Asthma, COPD, PNA, COVID/Flu/RSV, STEMI, Tamponade, TPNX, Sepsis   Co morbidities that complicate the patient evaluation  PAD, AAA, arthritis, asthma, COPD, CAD, CKD, GERD, HLD, HTN   Additional history obtained:  Dr. Tobie PCP   Problem List / ED Course / Critical interventions / Medication management  Admitting patient for COPD exacerbation Patient presents to ED concerned for cough and intermittent SOB x1 week. Also with subjective fevers.  Physical exam with diffuse expiratory wheezing despite 2 rounds of Duonebs. Patient with tachycardia currently around 116BMP and tachypnea around 25 per min.  Patient's oxygen and saturating in the lower 90s -there was 1 episode of hypoxia at 86% on RA with good pulse ox waveform witnessed by my attending. Overall, patient does not appear to be in respiratory distress.  I Ordered, and personally interpreted labs.  CBC without leukocytosis or anemia.  CMP with elevation in BUN/creatinine at 26/1.99 which is near patient's baseline.  Lactic acid within normal limits. The patient was maintained on a cardiac monitor.  I personally viewed and interpreted the cardiac monitored which showed an underlying rhythm of: Sinus rhythm I have reviewed the patients home medicines and have made adjustments as  needed Dr. Pearlean admitting provider.   Social Determinants of Health:  none          Final Clinical Impression(s) / ED Diagnoses Final diagnoses:  COPD exacerbation [facility])    Rx / DC Orders ED Discharge Orders     None         Hoy Nidia JULIANNA DEVONNA 02/02/23 1830    Francesca Elsie CROME, MD 02/02/23 1954

## 2023-02-02 NOTE — ED Notes (Signed)
 Pt ambulated to restroom on room air. Spo2 down to 87%. 3L reapplied

## 2023-02-02 NOTE — ED Notes (Signed)
 Informed pt of need for urine specimen, pt verbalized understanding and states she will let me nurse know when she needs to use the bathroom

## 2023-02-02 NOTE — ED Notes (Signed)
 Pt placed on 3 L via [place] due to SpO2 86% RA, SpO2 now 95% on 3 L [place]

## 2023-02-02 NOTE — Assessment & Plan Note (Signed)
 O2 sats down to 82% on room air, placed on 2 to 3 L.  Likely due to COPD exacerbation.

## 2023-02-02 NOTE — ED Notes (Signed)
 Pt states she typically has low BP at baseline--fluid bolus started

## 2023-02-02 NOTE — Assessment & Plan Note (Signed)
 Ongoing tobacco abuse.  Smokes 1 pack of cigarettes daily. -Patient counseled to quit smoking -Declined nicotine patch

## 2023-02-02 NOTE — ED Notes (Signed)
 ED TO INPATIENT HANDOFF REPORT  ED Nurse Name and Phone #: [facility] [phone]  S Name/Age/Gender Kaitlin [name] 73 y.o. female Room/Bed: APA19/APA19  Code Status   Code Status: Full Code  Home/SNF/Other Home Patient oriented to: self, place, time, and situation Is this baseline? Yes   Triage Complete: Triage complete  Chief Complaint COPD with acute exacerbation (HCC) [J44.1]  Triage Note Pt with cough x 1 week, productive at times clear and green at times.  Last fever yesterday per pt, does not have a thermometer at home. Pt had some dizziness on way here, denies at present.    Allergies No Known Allergies  Level of Care/Admitting Diagnosis ED Disposition     ED Disposition  Admit   Condition  --   Comment  Hospital Area: [facility] [[id number]]  Level of Care: Telemetry [5]  Covid Evaluation: Asymptomatic - no recent exposure (last 10 days) testing not required  Diagnosis: COPD with acute exacerbation [facility]) [[id number]]  Admitting Physician: EMOKPAE, EJIROGHENE E [phone]  Attending Physician: EMOKPAE, EJIROGHENE E [email or web address]  Certification:: I certify this patient will need inpatient services for at least 2 midnights          B Medical/Surgery History Past Medical History:  Diagnosis Date   AAA (abdominal aortic aneurysm) (HCC)    Anemia    Arthritis    Asthma    CAD (coronary artery disease)    stents   Calculus of gallbladder without cholecystitis without obstruction    Carotid artery disease (HCC)    Chronic kidney disease, stage 3a (HCC)    COPD (chronic obstructive pulmonary disease) (HCC)    Emphysema of lung (HCC)    GERD (gastroesophageal reflux disease)    Hyperlipidemia    Hypertension    Iliac artery stenosis, right (HCC)    60%-70%   Myocardial infarction (HCC)    Normocytic anemia 01/26/2017   PAD (peripheral artery disease) (HCC) 12/14/2010   in the left vertebral, bilateral carotids   Past Surgical History:  Procedure Laterality  Date   CARDIAC CATHETERIZATION  02/04/2005   A cypher 5.3 x 18 mm at 16 atmosphere of pressure in the mid LAD, this stent covered the proximal part of the LAD and also the mid LAD, this was then post dilated with 3.25 x 15 mm Quantum at 16 atmosphereof pressure for 45 seconds   CATARACT EXTRACTION Bilateral    CHOLECYSTECTOMY N/A 08/16/2017   Procedure: LAPAROSCOPIC CHOLECYSTECTOMY;  Surgeon: Mavis Anes, MD;  Location: [facility];  Service: General;  Laterality: N/A;   COLONOSCOPY N/A 03/01/2017   Procedure: COLONOSCOPY;  Surgeon: Harvey Margo CROME, MD;  Location: [facility];  Service: Endoscopy;  Laterality: N/A;  1:00pm   ESOPHAGOGASTRODUODENOSCOPY N/A 01/28/2017   Procedure: ESOPHAGOGASTRODUODENOSCOPY (EGD);  Surgeon: Albertus Gordy HERO, MD;  Location: [facility] ENDOSCOPY;  Service: Gastroenterology;  Laterality: N/A;   GIVENS CAPSULE STUDY N/A 03/12/2017   Procedure: GIVENS CAPSULE STUDY;  Surgeon: Harvey Margo CROME, MD;  Location: [facility];  Service: Endoscopy;  Laterality: N/A;  7:30am   TUBAL LIGATION       A IV Location/Drains/Wounds Patient Lines/Drains/Airways Status     Active Line/Drains/Airways     Name Placement date Placement time Site Days   Peripheral IV 02/02/23 20 G 1 Anterior;Proximal;Right Forearm 02/02/23  1448  Forearm  less than 1   Peripheral IV 02/02/23 20 G 1 Left Antecubital 02/02/23  1448  Antecubital  less than 1   Incision - 4 Ports  Abdomen 1: Umbilicus 2: Mid;Upper;Other (Comment) 3: Right;Mid;Upper 4: Right;Lateral 08/16/17  0755  -- 1996            Intake/Output Last 24 hours No intake or output data in the 24 hours ending 02/02/23 2308  Labs/Imaging Results for orders placed or performed during the hospital encounter of 02/02/23 (from the past 48 hours)  Comprehensive metabolic panel     Status: Abnormal   Collection Time: 02/02/23  2:41 PM  Result Value Ref Range   Sodium 138 135 - 145 mmol/L   Potassium 4.0 3.5 - 5.1 mmol/L   Chloride 105 98 - 111  mmol/L   CO2 23 22 - 32 mmol/L   Glucose, Bld 110 (H) 70 - 99 mg/dL    Comment: Glucose reference range applies only to samples taken after fasting for at least 8 hours.   BUN 26 (H) 8 - 23 mg/dL   Creatinine, Ser 8.00 (H) 0.44 - 1.00 mg/dL   Calcium  9.3 8.9 - 10.3 mg/dL   Total Protein 7.7 6.5 - 8.1 g/dL   Albumin 3.7 3.5 - 5.0 g/dL   AST 15 15 - 41 U/L   ALT 15 0 - 44 U/L   Alkaline Phosphatase 108 38 - 126 U/L   Total Bilirubin 0.8 0.0 - 1.2 mg/dL   GFR, Estimated 26 (L) >60 mL/min    Comment: (NOTE) Calculated using the CKD-EPI Creatinine Equation (2021)    Anion gap 10 5 - 15    Comment: Performed at [facility], [address], [place], [place] [address]  Lactic acid, plasma     Status: None   Collection Time: 02/02/23  2:41 PM  Result Value Ref Range   Lactic Acid, Venous 0.9 0.5 - 1.9 mmol/L    Comment: Performed at [facility], [address], [place], [place] [address]  CBC with Differential     Status: None   Collection Time: 02/02/23  2:41 PM  Result Value Ref Range   WBC 7.5 4.0 - 10.5 K/uL   RBC 4.27 3.87 - 5.11 MIL/uL   Hemoglobin 13.5 12.0 - 15.0 g/dL   HCT 58.5 63.9 - 53.9 %   MCV 97.0 80.0 - 100.0 fL   MCH 31.6 26.0 - 34.0 pg   MCHC 32.6 30.0 - 36.0 g/dL   RDW 87.8 88.4 - 84.4 %   Platelets 184 150 - 400 K/uL   nRBC 0.0 0.0 - 0.2 %   Neutrophils Relative % 70 %   Neutro Abs 5.3 1.7 - 7.7 K/uL   Lymphocytes Relative 18 %   Lymphs Abs 1.3 0.7 - 4.0 K/uL   Monocytes Relative 10 %   Monocytes Absolute 0.7 0.1 - 1.0 K/uL   Eosinophils Relative 1 %   Eosinophils Absolute 0.1 0.0 - 0.5 K/uL   Basophils Relative 1 %   Basophils Absolute 0.0 0.0 - 0.1 K/uL   Immature Granulocytes 0 %   Abs Immature Granulocytes 0.02 0.00 - 0.07 K/uL    Comment: Performed at [facility], [address], [place], [place] [address]  Protime-INR     Status: None   Collection Time: 02/02/23  2:41 PM  Result Value Ref Range   Prothrombin Time 13.5 11.4 - 15.2 seconds   INR 1.0  0.8 - 1.2    Comment: (NOTE) INR goal varies based on device and disease states. Performed at [facility], [address] [place], [place] [address]   Culture, blood (Routine x 2)  Status: None (Preliminary result)   Collection Time: 02/02/23  2:41 PM   Specimen: Left Antecubital; Blood  Result Value Ref Range   Specimen Description      LEFT ANTECUBITAL BOTTLES DRAWN AEROBIC AND ANAEROBIC   Special Requests      Blood Culture adequate volume Performed at [facility], [address], [place], [place] [address]    Culture PENDING    Report Status PENDING   Culture, blood (Routine x 2)     Status: None (Preliminary result)   Collection Time: 02/02/23  2:41 PM   Specimen: Right Antecubital; Blood  Result Value Ref Range   Specimen Description      RIGHT ANTECUBITAL BOTTLES DRAWN AEROBIC AND ANAEROBIC   Special Requests      Blood Culture results may not be optimal due to an inadequate volume of blood received in culture bottles Performed at [facility], [address] [place], [place] [address]    Culture PENDING    Report Status PENDING   Lactic acid, plasma     Status: None   Collection Time: 02/02/23  4:41 PM  Result Value Ref Range   Lactic Acid, Venous 1.7 0.5 - 1.9 mmol/L    Comment: Performed at [facility], [address] [place], [place] [address]  Urinalysis, w/ Reflex to Culture (Infection Suspected) -Urine, Clean Catch     Status: Abnormal   Collection Time: 02/02/23  7:22 PM  Result Value Ref Range   Specimen Source URINE, CATHETERIZED    Color, Urine YELLOW YELLOW   APPearance HAZY (A) CLEAR   Specific Gravity, Urine 1.018 1.005 - 1.030   pH 5.0 5.0 - 8.0   Glucose, UA NEGATIVE NEGATIVE mg/dL   Hgb urine dipstick SMALL (A) NEGATIVE   Bilirubin Urine NEGATIVE NEGATIVE   Ketones, ur NEGATIVE NEGATIVE mg/dL   Protein, ur 30 (A) NEGATIVE mg/dL   Nitrite NEGATIVE NEGATIVE   Leukocytes,Ua NEGATIVE NEGATIVE   RBC / HPF 21-50 0 - 5 RBC/hpf   WBC, UA 0-5 0 -  5 WBC/hpf    Comment:        Reflex urine culture not performed if WBC <=10, OR if Squamous epithelial cells >5. If Squamous epithelial cells >5 suggest recollection.    Bacteria, UA MANY (A) NONE SEEN   Squamous Epithelial / HPF 0-5 0 - 5 /HPF   Mucus PRESENT     Comment: Performed at [facility], [address], [place], [place] [address]  SARS Coronavirus 2 by RT PCR (hospital order, performed in [facility] hospital lab) *cepheid single result test* Anterior Nasal Swab     Status: None   Collection Time: 02/02/23  9:34 PM   Specimen: Anterior Nasal Swab  Result Value Ref Range   SARS Coronavirus 2 by RT PCR NEGATIVE NEGATIVE    Comment: (NOTE) SARS-CoV-2 target nucleic acids are NOT DETECTED.  The SARS-CoV-2 RNA is generally detectable in upper and lower respiratory specimens during the acute phase of infection. The lowest concentration of SARS-CoV-2 viral copies this assay can detect is 250 copies / mL. A negative result does not preclude SARS-CoV-2 infection and should not be used as the sole basis for treatment or other patient management decisions.  A negative result may occur with improper specimen collection / handling, submission of specimen other than nasopharyngeal swab, presence of viral mutation(s) within the areas targeted by this assay, and inadequate number of viral copies (<250 copies / mL). A negative result must be combined with  clinical observations, patient history, and epidemiological information.  Fact Sheet for Patients:   roadlaptop.co.za  Fact Sheet for Healthcare Providers: http://kim-miller.com/  This test is not yet approved or  cleared by the [place]  FDA and has been authorized for detection and/or diagnosis of SARS-CoV-2 by FDA under an Emergency Use Authorization (EUA).  This EUA will remain in effect (meaning this test can be used) for the duration of the COVID-19 declaration under Section  564(b)(1) of the Act, 21 U.S.C. section 360bbb-3(b)(1), unless the authorization is terminated or revoked sooner.  Performed at [facility], [address] [place], [place] [address]    DG Chest Port 1 View Result Date: 02/02/2023 CLINICAL DATA:  Cough for 1 week EXAM: PORTABLE CHEST 1 VIEW COMPARISON:  Chest x-ray 01/28/2017.  Chest CT 02/22/2022 FINDINGS: Prominent anterior rib ends noted over the lower lungs bilaterally. Alternatively, small amount of atelectasis or airspace disease can not be excluded in the lungs. No other focal lung infiltrate, pleural effusion or pneumothorax identified. The cardiomediastinal silhouette is within normal limits. No acute fractures are seen. IMPRESSION: Prominent anterior rib ends noted over the lower lungs bilaterally. Alternatively, small amount of atelectasis or airspace disease can not be excluded in the lungs. Consider repeat PA and lateral chest x-ray for further evaluation. Electronically Signed   By: Greig Pique M.D.   On: 02/02/2023 15:21    Pending Labs Unresulted Labs (From admission, onward)     Start     Ordered   02/03/23 0500  Basic metabolic panel  Tomorrow morning,   R        02/02/23 2139   02/03/23 0500  CBC  Tomorrow morning,   R        02/02/23 2139            Vitals/Pain Today's Vitals   02/02/23 2145 02/02/23 2200 02/02/23 2215 02/02/23 2300  BP: (!) 101/44 (!) 94/56 103/63 (!) 106/50  Pulse: 97 99 100 (!) 102  Resp: (!) 21 (!) 21 (!) 25 (!) 24  Temp:      TempSrc:      SpO2: 94% 93% 92% 96%  Weight:      Height:      PainSc:        Isolation Precautions Airborne and Contact precautions  Medications Medications  0.9 %  sodium chloride  infusion ( Intravenous New Bag/Given 02/02/23 2134)  clopidogrel  (PLAVIX ) tablet 75 mg (75 mg Oral Given 02/02/23 2220)  aspirin  chewable tablet 81 mg (81 mg Oral Given 02/02/23 2220)  heparin  injection 5,000 Units (5,000 Units Subcutaneous Given 02/02/23 2221)  acetaminophen   (TYLENOL ) tablet 650 mg (has no administration in time range)    Or  acetaminophen  (TYLENOL ) suppository 650 mg (has no administration in time range)  ondansetron  (ZOFRAN ) tablet 4 mg (has no administration in time range)    Or  ondansetron  (ZOFRAN ) injection 4 mg (has no administration in time range)  polyethylene glycol (MIRALAX  / GLYCOLAX ) packet 17 g (has no administration in time range)  methylPREDNISolone  sodium succinate (SOLU-MEDROL ) 125 mg/2 mL injection 60 mg (has no administration in time range)  ipratropium-albuterol  (DUONEB) 0.5-2.5 (3) MG/3ML nebulizer solution 3 mL (3 mLs Nebulization Given 02/02/23 1557)  sodium chloride  0.9 % bolus 1,000 mL (0 mLs Intravenous Stopped 02/02/23 1811)  ipratropium-albuterol  (DUONEB) 0.5-2.5 (3) MG/3ML nebulizer solution 3 mL (3 mLs Nebulization Given 02/02/23 1609)  ipratropium-albuterol  (DUONEB) 0.5-2.5 (3) MG/3ML nebulizer solution 3 mL (3 mLs Nebulization Given 02/02/23 1740)  methylPREDNISolone  sodium  succinate (SOLU-MEDROL ) 125 mg/2 mL injection 60 mg (60 mg Intravenous Given 02/02/23 1807)  cefTRIAXone  (ROCEPHIN ) 1 g in sodium chloride  0.9 % 100 mL IVPB (0 g Intravenous Stopped 02/02/23 1840)  azithromycin  (ZITHROMAX ) 500 mg in sodium chloride  0.9 % 250 mL IVPB (0 mg Intravenous Stopped 02/02/23 2003)  ondansetron  (ZOFRAN ) injection 4 mg (4 mg Intravenous Given 02/02/23 1932)    Mobility walks     Focused Assessments Pulmonary Assessment Handoff:  Lung sounds: Bilateral Breath Sounds: Expiratory wheezes O2 Device: Nasal Cannula O2 Flow Rate (L/min): 3 L/min    R Recommendations: See Admitting Provider Note  Report given to:   Additional Notes:

## 2023-02-02 NOTE — ED Triage Notes (Signed)
 Pt with cough x 1 week, productive at times clear and green at times.  Last fever yesterday per pt, does not have a thermometer at home. Pt had some dizziness on way here, denies at present.

## 2023-02-02 NOTE — Assessment & Plan Note (Addendum)
 COPD exacerbation with hypoxia.  Faint expiratory wheezing on exam.  Ongoing tobacco abuse.  Tachypneic, tachycardic heart rate up to 125.  WBC 7.5.  Lactic acid 0.9 > 1.7.  Portable chest x-ray- , small amount of atelectasis or airspace disease can not be excluded in the lungs. -Repeat chest x-ray-two-view in a.m. -Check COVID, influenza and RSV -IV Solu-Medrol  60 twice daily -Continue with IV ceftriaxone  started in ED -DuoNebs as needed and scheduled -1 L bolus given, continue N/s 75 cc/hr x 12hrs

## 2023-02-02 NOTE — H&P (Signed)
 History and Physical    Kaitlin [name] FMW:[id number] DOB: October 18, 1950 DOA: 02/02/2023  PCP: Tobie Suzzane POUR, MD   Patient coming from: Home  I have personally briefly reviewed patient's old medical records in [facility]  Chief Complaint: Difficulty breathing  HPI: Kaitlin [name] is a 73 y.o. female with medical history significant for COPD, ongoing tobacco abuse, coronary artery disease, peripheral artery disease, CKD 3. Patient presented to the ED with complaints of cough, and difficulty breathing over the past week.  Cough has been productive of greenish sputum.  She also reports sore throat, nasal congestion.  She currently smokes a pack of cigarettes daily.  She reports couple episodes of vomiting due to severity of cough, and poor oral intake over the past few days. She reports at baseline her blood pressure is usually on the lower side -with systolic that can be as low as 95.  ED Course: Tmax 98.9.  Heart rate 89-125.  Blood pressure systolic 84-118.  O2 sats down to 82% on room air, placed on 2-3 L. Lactic acid 0.9 > 7. WBC 7.5. Chest x-ray-atelectasis or airspace disease not excluded, consider repeat PA and lateral chest x-ray. IV ceftriaxone  and azithromycin  given.  Solu-Medrol  60 mg x 1 given.  1 L bolus given.  Review of Systems: As per HPI all other systems reviewed and negative.  Past Medical History:  Diagnosis Date   AAA (abdominal aortic aneurysm) (HCC)    Anemia    Arthritis    Asthma    CAD (coronary artery disease)    stents   Calculus of gallbladder without cholecystitis without obstruction    Carotid artery disease (HCC)    Chronic kidney disease, stage 3a (HCC)    COPD (chronic obstructive pulmonary disease) (HCC)    Emphysema of lung (HCC)    GERD (gastroesophageal reflux disease)    Hyperlipidemia    Hypertension    Iliac artery stenosis, right (HCC)    60%-70%   Myocardial infarction (HCC)    Normocytic anemia 01/26/2017   PAD (peripheral  artery disease) (HCC) 12/14/2010   in the left vertebral, bilateral carotids    Past Surgical History:  Procedure Laterality Date   CARDIAC CATHETERIZATION  02/04/2005   A cypher 5.3 x 18 mm at 16 atmosphere of pressure in the mid LAD, this stent covered the proximal part of the LAD and also the mid LAD, this was then post dilated with 3.25 x 15 mm Quantum at 16 atmosphereof pressure for 45 seconds   CATARACT EXTRACTION Bilateral    CHOLECYSTECTOMY N/A 08/16/2017   Procedure: LAPAROSCOPIC CHOLECYSTECTOMY;  Surgeon: Mavis Anes, MD;  Location: [facility];  Service: General;  Laterality: N/A;   COLONOSCOPY N/A 03/01/2017   Procedure: COLONOSCOPY;  Surgeon: Harvey Margo CROME, MD;  Location: [facility];  Service: Endoscopy;  Laterality: N/A;  1:00pm   ESOPHAGOGASTRODUODENOSCOPY N/A 01/28/2017   Procedure: ESOPHAGOGASTRODUODENOSCOPY (EGD);  Surgeon: Albertus Gordy HERO, MD;  [facility] END[facility]COPY;  Service: Gastroenterology;  Laterality: N/A;   GIVENS CAPSULE STUDY N/A 03/12/2017   Procedure: GIVENS CAPSULE STUDY;  Surgeon: Harvey Margo CROME, MD;  Location: [facility] SU[facility];  Service: Endoscopy;  Laterality: N/A;  7:30am   TUBAL LIGATION       reports that she has been smoking cigarettes. She started smoking about 56 years ago. She has a 56 pack-year smoking history. She has never used smokeless tobacco. She reports that she does not drink alcohol and does not use drugs.  No Known Allergies  Family History  Problem Relation Age of Onset   Heart attack Mother    Hyperlipidemia Mother    Hypertension Mother    Diabetes Mother    Heart attack Father    Early death Father 62   Hyperlipidemia Father    Hypertension Father    Heart disease Sister    Hyperlipidemia Sister    Hypertension Sister    Diabetes Brother    Heart disease Sister    Hyperlipidemia Sister    Hypertension Sister    Heart attack Sister    Heart disease Sister    Hyperlipidemia Sister    Hypertension Sister    Heart attack  Brother    Early death Brother 76   Hyperlipidemia Brother    Hypertension Brother    Heart attack Brother    Early death Brother 33   Hyperlipidemia Brother    Hypertension Brother    Cancer Maternal Aunt    Colon cancer Neg Hx    Colon polyps Neg Hx     Prior to Admission medications   Medication Sig Start Date End Date Taking? Authorizing Provider  albuterol  (VENTOLIN  HFA) 108 (90 Base) MCG/ACT inhaler Inhale 2 puffs into the lungs every 6 (six) hours as needed for wheezing or shortness of breath. 02/19/22  Yes Tobie Suzzane POUR, MD  aspirin  81 MG chewable tablet Chew 81 mg by mouth daily.   Yes [provider]  atorvastatin  (LIPITOR) 80 MG tablet Take 1 tablet (80 mg total) by mouth daily. 04/19/22  Yes Tobie Suzzane POUR, MD  cholecalciferol (VITAMIN D ) 1000 units tablet Take 2 Units by mouth daily.    Yes [provider]  clopidogrel  (PLAVIX ) 75 MG tablet TAKE 1 TABLET BY MOUTH EVERY DAY 08/20/22  Yes Branch, Dorn FALCON, MD  ferrous sulfate 325 (65 FE) MG tablet Take 325 mg by mouth daily with breakfast.   Yes [provider]  lisinopril  (ZESTRIL ) 2.5 MG tablet TAKE 1 TABLET BY MOUTH EVERY DAY 05/16/22  Yes Branch, Dorn FALCON, MD  NITROSTAT  0.4 MG SL tablet PLACE 1 TABLET UNDER TONGUE EVERY 5 MINUTES FOR 3 DOSES AS NEEDED. Patient taking differently: Place 0.4 mg under the tongue every 5 (five) minutes as needed for chest pain. 05/23/17  Yes BranchDorn FALCON, MD  vitamin B-12 (CYANOCOBALAMIN ) 500 MCG tablet Take 500 mcg by mouth daily.   Yes [provider]    Physical Exam: Vitals:   02/02/23 1930 02/02/23 1945 02/02/23 2000 02/02/23 2030  BP: (!) 105/53 (!) 107/57 (!) 103/59 115/78  Pulse: (!) 119 (!) 120 (!) 116 (!) 125  Resp: (!) 26 (!) 32 (!) 22 (!) 21  Temp:      TempSrc:      SpO2: 92% 92% 93% 92%  Weight:      Height:        Constitutional: NAD, calm, comfortable Vitals:   02/02/23 1930 02/02/23 1945 02/02/23 2000 02/02/23 2030  BP:  (!) 105/53 (!) 107/57 (!) 103/59 115/78  Pulse: (!) 119 (!) 120 (!) 116 (!) 125  Resp: (!) 26 (!) 32 (!) 22 (!) 21  Temp:      TempSrc:      SpO2: 92% 92% 93% 92%  Weight:      Height:       Eyes: PERRL, lids and conjunctivae normal ENMT: Mucous membranes are moist.  Neck: normal, supple, no masses, no thyromegaly Respiratory: Faint expiratory wheezing, otherwise no crackles, normal work of  breathing, no accessory muscle use. Cardiovascular: Tachycardic, regular rate and rhythm, no murmurs / rubs / gallops. No extremity edema. Extremities warm Abdomen: no tenderness, no masses palpated. No hepatosplenomegaly. Bowel sounds positive.  Musculoskeletal: no clubbing / cyanosis. No joint deformity upper and lower extremities. Skin: no rashes, lesions, ulcers. No induration Neurologic: No facial asymmetry, moving extremities spontaneously, speech fluent.  Psychiatric: Normal judgment and insight. Alert and oriented x 3. Normal mood.   Labs on Admission: I have personally reviewed following labs and imaging studies  CBC: Recent Labs  Lab 02/02/23 1441  WBC 7.5  NEUTROABS 5.3  HGB 13.5  HCT 41.4  MCV 97.0  PLT 184   Basic Metabolic Panel: Recent Labs  Lab 02/02/23 1441  NA 138  K 4.0  CL 105  CO2 23  GLUCOSE 110*  BUN 26*  CREATININE 1.99*  CALCIUM  9.3   GFR: Estimated Creatinine Clearance: 19.6 mL/min (A) (by C-G formula based on SCr of 1.99 mg/dL (H)). Liver Function Tests: Recent Labs  Lab 02/02/23 1441  AST 15  ALT 15  ALKPHOS 108  BILITOT 0.8  PROT 7.7  ALBUMIN 3.7   Coagulation Profile: Recent Labs  Lab 02/02/23 1441  INR 1.0   Urine analysis:    Component Value Date/Time   COLORURINE YELLOW 02/02/2023 1922   APPEARANCEUR HAZY (A) 02/02/2023 1922   LABSPEC 1.018 02/02/2023 1922   PHURINE 5.0 02/02/2023 1922   GLUCOSEU NEGATIVE 02/02/2023 1922   HGBUR SMALL (A) 02/02/2023 1922   BILIRUBINUR NEGATIVE 02/02/2023 1922   KETONESUR NEGATIVE 02/02/2023  1922   PROTEINUR 30 (A) 02/02/2023 1922   UROBILINOGEN 0.2 04/23/2007 1113   NITRITE NEGATIVE 02/02/2023 1922   LEUKOCYTESUR NEGATIVE 02/02/2023 1922    Radiological Exams on Admission: DG Chest Port 1 View Result Date: 02/02/2023 CLINICAL DATA:  Cough for 1 week EXAM: PORTABLE CHEST 1 VIEW COMPARISON:  Chest x-ray 01/28/2017.  Chest CT 02/22/2022 FINDINGS: Prominent anterior rib ends noted over the lower lungs bilaterally. Alternatively, small amount of atelectasis or airspace disease can not be excluded in the lungs. No other focal lung infiltrate, pleural effusion or pneumothorax identified. The cardiomediastinal silhouette is within normal limits. No acute fractures are seen. IMPRESSION: Prominent anterior rib ends noted over the lower lungs bilaterally. Alternatively, small amount of atelectasis or airspace disease can not be excluded in the lungs. Consider repeat PA and lateral chest x-ray for further evaluation. Electronically Signed   By: Greig Pique M.D.   On: 02/02/2023 15:21    EKG: Independently reviewed.  Sinus rhythm, rate 99, QTc 425.  No change from prior.  Assessment/Plan Principal Problem:   COPD with acute exacerbation (HCC) Active Problems:   Acute hypoxic respiratory failure (HCC)   CAD (coronary artery disease)   Tobacco abuse   Stage 3b chronic kidney disease (HCC)  Assessment and Plan: * COPD with acute exacerbation (HCC) COPD exacerbation with hypoxia.  Faint expiratory wheezing on exam.  Ongoing tobacco abuse.  Tachypneic, tachycardic heart rate up to 125.  WBC 7.5.  Lactic acid 0.9 > 1.7.  Portable chest x-ray- , small amount of atelectasis or airspace disease can not be excluded in the lungs. -Repeat chest x-ray-two-view in a.m. -Check COVID, influenza and RSV -IV Solu-Medrol  60 twice daily -Continue with IV ceftriaxone  started in ED -DuoNebs as needed and scheduled -1 L bolus given, continue N/s 75 cc/hr x 12hrs  Acute hypoxic respiratory failure  (HCC) O2 sats down to 82% on room air, placed on 2  to 3 L.  Likely due to COPD exacerbation.  Stage 3b chronic kidney disease (HCC) CKD stage IIIb/4.  Creatinine 1.9, close to baseline 1.7-1.8.  Tobacco abuse Ongoing tobacco abuse.  Smokes 1 pack of cigarettes daily. -Patient counseled to quit smoking -Declined nicotine patch  CAD (coronary artery disease) No chest pain.  EKG unchanged.  On statins, aspirin  and Plavix . -Resume Plavix , aspirin    DVT prophylaxis: Heparin  Code Status:  FULL code-confirmed with patient and daughter Kaitlin [name] at bedside. Family Communication: Daughter- Kaitlin [name] at bedside Disposition Plan: ~ 2 days Consults called: None Admission status: Inpt Stepdown I certify that at the point of admission it is my clinical judgment that the patient will require inpatient hospital care spanning beyond 2 midnights from the point of admission due to high intensity of service, high risk for further deterioration and high frequency of surveillance required.   Author: Tully FORBES Carwin, MD 02/02/2023 9:49 PM  For on call review [email or web address].

## 2023-02-03 DIAGNOSIS — Z720 Tobacco use: Secondary | ICD-10-CM | POA: Diagnosis not present

## 2023-02-03 DIAGNOSIS — J441 Chronic obstructive pulmonary disease with (acute) exacerbation: Secondary | ICD-10-CM | POA: Diagnosis not present

## 2023-02-03 DIAGNOSIS — J9601 Acute respiratory failure with hypoxia: Secondary | ICD-10-CM | POA: Diagnosis not present

## 2023-02-03 LAB — CBC
HCT: 35.6 % — ABNORMAL LOW (ref 36.0–46.0)
Hemoglobin: 11.5 g/dL — ABNORMAL LOW (ref 12.0–15.0)
MCH: 32 pg (ref 26.0–34.0)
MCHC: 32.3 g/dL (ref 30.0–36.0)
MCV: 99.2 fL (ref 80.0–100.0)
Platelets: 165 10*3/uL (ref 150–400)
RBC: 3.59 MIL/uL — ABNORMAL LOW (ref 3.87–5.11)
RDW: 12.4 % (ref 11.5–15.5)
WBC: 4.9 10*3/uL (ref 4.0–10.5)
nRBC: 0 % (ref 0.0–0.2)

## 2023-02-03 LAB — BASIC METABOLIC PANEL
Anion gap: 10 (ref 5–15)
BUN: 26 mg/dL — ABNORMAL HIGH (ref 8–23)
CO2: 21 mmol/L — ABNORMAL LOW (ref 22–32)
Calcium: 8.8 mg/dL — ABNORMAL LOW (ref 8.9–10.3)
Chloride: 108 mmol/L (ref 98–111)
Creatinine, Ser: 1.69 mg/dL — ABNORMAL HIGH (ref 0.44–1.00)
GFR, Estimated: 32 mL/min — ABNORMAL LOW (ref >60)
Glucose, Bld: 176 mg/dL — ABNORMAL HIGH (ref 70–99)
Potassium: 3.8 mmol/L (ref 3.5–5.1)
Sodium: 139 mmol/L (ref 135–145)

## 2023-02-03 MED ORDER — CEFDINIR 300 MG PO CAPS: 300.0000 mg | ORAL_CAPSULE | Freq: Every day | ORAL | 0 refills | Status: AC

## 2023-02-03 MED ORDER — SODIUM CHLORIDE 0.9 % IV SOLN
1.0000 g | INTRAVENOUS | Status: DC
Start: 2023-02-03 — End: 2023-02-03

## 2023-02-03 MED ORDER — DEXTROMETHORPHAN POLISTIREX ER 30 MG/5ML PO SUER: 30.0000 mg | Freq: Two times a day (BID) | ORAL | 0 refills | Status: AC | PRN

## 2023-02-03 MED ORDER — DOXYCYCLINE HYCLATE 100 MG PO CAPS: 100.0000 mg | ORAL_CAPSULE | Freq: Two times a day (BID) | ORAL | 0 refills | Status: DC

## 2023-02-03 MED ORDER — GUAIFENESIN ER 600 MG PO TB12
600.0000 mg | ORAL_TABLET | Freq: Two times a day (BID) | ORAL | 0 refills | Status: AC
Start: 2023-02-03 — End: 2023-02-06

## 2023-02-03 MED ORDER — PREDNISONE 20 MG PO TABS: 20.0000 mg | ORAL_TABLET | Freq: Every day | ORAL | 0 refills | Status: DC

## 2023-02-03 NOTE — Discharge Summary (Signed)
 Physician Discharge Summary  Kaitlin [name] FMW:[id number] DOB: May 20, 1950 DOA: 02/02/2023  PCP: Tobie Suzzane POUR, MD  Admit date: 02/02/2023 Discharge date: 02/03/2023  Admitted From:  HOME  Disposition:  HOME   Recommendations for Outpatient Follow-up:  Follow up with PCP in 1 weeks Please continue to work on smoking cessation   Home Health: n/a   Discharge Condition: STABLE   CODE STATUS: FuLL DIET: resume home diet    Brief Hospitalization Summary: Please see all hospital notes, images, labs for full details of the hospitalization. Admission provider HPI:   73 y.o. female with medical history significant for COPD, ongoing tobacco abuse, coronary artery disease, peripheral artery disease, CKD 3. Patient presented to the ED with complaints of cough, and difficulty breathing over the past week.  Cough has been productive of greenish sputum.  She also reports sore throat, nasal congestion.  She currently smokes a pack of cigarettes daily.  She reports couple episodes of vomiting due to severity of cough, and poor oral intake over the past few days. She reports at baseline her blood pressure is usually on the lower side -with systolic that can be as low as 95.   ED Course: Tmax 98.9.  Heart rate 89-125.  Blood pressure systolic 84-118.  O2 sats down to 82% on room air, placed on 2-3 L. Lactic acid 0.9 > 7.  WBC 7.5.  Chest x-ray-atelectasis or airspace disease not excluded, consider repeat PA and lateral chest x-ray. IV ceftriaxone  and azithromycin  given.  Solu-Medrol  60 mg x 1 given.  1 L bolus given.  Hospital Course  Pt was admitted for an acute COPD exacerbation and treated with IV steroids and antibiotics and bronchodilators and fortunately she responded very fast to the therapy and she is currently on room air oxygen and breathing much better and eager to go home.  She reports no difficulty breathing.  She has been ambulating in the room as she feels well to go home.  Patient was strongly  advised to stop all tobacco use.  Patient is being discharged home in stable condition.  Discharge Diagnoses:  Principal Problem:   COPD with acute exacerbation (HCC) Active Problems:   CAD (coronary artery disease)   Tobacco abuse   Stage 3b chronic kidney disease (HCC)   Acute hypoxic respiratory failure (HCC)   Discharge Instructions:  Allergies as of 02/03/2023   No Known Allergies      Medication List     TAKE these medications    albuterol  108 (90 Base) MCG/ACT inhaler Commonly known as: VENTOLIN  HFA Inhale 2 puffs into the lungs every 6 (six) hours as needed for wheezing or shortness of breath.   aspirin  81 MG chewable tablet Chew 81 mg by mouth daily.   atorvastatin  80 MG tablet Commonly known as: Lipitor Take 1 tablet (80 mg total) by mouth daily.   cefdinir  300 MG capsule Commonly known as: OMNICEF  Take 1 capsule (300 mg total) by mouth daily for 2 days.   cholecalciferol 1000 units tablet Commonly known as: VITAMIN D  Take 2 Units by mouth daily.   clopidogrel  75 MG tablet Commonly known as: PLAVIX  TAKE 1 TABLET BY MOUTH EVERY DAY   dextromethorphan  30 MG/5ML liquid Commonly known as: Delsym  Take 5 mLs (30 mg total) by mouth 2 (two) times daily as needed for cough.   doxycycline  100 MG capsule Commonly known as: VIBRAMYCIN  Take 1 capsule (100 mg total) by mouth 2 (two) times daily for 3 days.   ferrous  sulfate 325 (65 FE) MG tablet Take 325 mg by mouth daily with breakfast.   guaiFENesin  600 MG 12 hr tablet Commonly known as: Mucinex  Take 1 tablet (600 mg total) by mouth 2 (two) times daily for 3 days.   lisinopril  2.5 MG tablet Commonly known as: ZESTRIL  TAKE 1 TABLET BY MOUTH EVERY DAY   Nitrostat  0.4 MG SL tablet Generic drug: nitroGLYCERIN  PLACE 1 TABLET UNDER TONGUE EVERY 5 MINUTES FOR 3 DOSES AS NEEDED. What changed: See the new instructions.   predniSONE  20 MG tablet Commonly known as: DELTASONE  Take 1 tablet (20 mg total) by  mouth daily with breakfast for 4 days. Start taking on: February 04, 2023   vitamin B-12 500 MCG tablet Commonly known as: CYANOCOBALAMIN  Take 500 mcg by mouth daily.        Follow-up Information     Tobie Suzzane POUR, MD. Schedule an appointment as soon as possible for a visit in 1 week(s).   Specialty: Internal Medicine Why: Hospital Follow Up Contact information: [address] [place] [address] [phone]                No Known Allergies Allergies as of 02/03/2023   No Known Allergies      Medication List     TAKE these medications    albuterol  108 (90 Base) MCG/ACT inhaler Commonly known as: VENTOLIN  HFA Inhale 2 puffs into the lungs every 6 (six) hours as needed for wheezing or shortness of breath.   aspirin  81 MG chewable tablet Chew 81 mg by mouth daily.   atorvastatin  80 MG tablet Commonly known as: Lipitor Take 1 tablet (80 mg total) by mouth daily.   cefdinir  300 MG capsule Commonly known as: OMNICEF  Take 1 capsule (300 mg total) by mouth daily for 2 days.   cholecalciferol 1000 units tablet Commonly known as: VITAMIN D  Take 2 Units by mouth daily.   clopidogrel  75 MG tablet Commonly known as: PLAVIX  TAKE 1 TABLET BY MOUTH EVERY DAY   dextromethorphan  30 MG/5ML liquid Commonly known as: Delsym  Take 5 mLs (30 mg total) by mouth 2 (two) times daily as needed for cough.   doxycycline  100 MG capsule Commonly known as: VIBRAMYCIN  Take 1 capsule (100 mg total) by mouth 2 (two) times daily for 3 days.   ferrous sulfate 325 (65 FE) MG tablet Take 325 mg by mouth daily with breakfast.   guaiFENesin  600 MG 12 hr tablet Commonly known as: Mucinex  Take 1 tablet (600 mg total) by mouth 2 (two) times daily for 3 days.   lisinopril  2.5 MG tablet Commonly known as: ZESTRIL  TAKE 1 TABLET BY MOUTH EVERY DAY   Nitrostat  0.4 MG SL tablet Generic drug: nitroGLYCERIN  PLACE 1 TABLET UNDER TONGUE EVERY 5 MINUTES FOR 3 DOSES AS NEEDED. What  changed: See the new instructions.   predniSONE  20 MG tablet Commonly known as: DELTASONE  Take 1 tablet (20 mg total) by mouth daily with breakfast for 4 days. Start taking on: February 04, 2023   vitamin B-12 500 MCG tablet Commonly known as: CYANOCOBALAMIN  Take 500 mcg by mouth daily.        Procedures/Studies: DG Chest Port 1 View Result Date: 02/02/2023 CLINICAL DATA:  Cough for 1 week EXAM: PORTABLE CHEST 1 VIEW COMPARISON:  Chest x-ray 01/28/2017.  Chest CT 02/22/2022 FINDINGS: Prominent anterior rib ends noted over the lower lungs bilaterally. Alternatively, small amount of atelectasis or airspace disease can not be excluded in the lungs. No other focal  lung infiltrate, pleural effusion or pneumothorax identified. The cardiomediastinal silhouette is within normal limits. No acute fractures are seen. IMPRESSION: Prominent anterior rib ends noted over the lower lungs bilaterally. Alternatively, small amount of atelectasis or airspace disease can not be excluded in the lungs. Consider repeat PA and lateral chest x-ray for further evaluation. Electronically Signed   By: Greig Pique M.D.   On: 02/02/2023 15:21     Subjective: Pt is on room air breathing well and wants to get home before ice storm.  Says she feels much better.    Discharge Exam: Vitals:   02/03/23 0758 02/03/23 0759  BP:    Pulse:    Resp:    Temp:    SpO2: 98% 98%   Vitals:   02/03/23 0534 02/03/23 0750 02/03/23 0758 02/03/23 0759  BP: 101/66     Pulse: 96     Resp: 16     Temp: (!) 97.5 F (36.4 C)     TempSrc: Oral     SpO2: 96% 98% 98% 98%  Weight:      Height:       General: Pt is alert, awake, not in acute distress Cardiovascular: normal S1/S2 +, no rubs, no gallops Respiratory: CTA bilaterally, no wheezing, no rhonchi Abdominal: Soft, NT, ND, bowel sounds + Extremities: no edema, no cyanosis   The results of significant diagnostics from this hospitalization (including imaging, microbiology,  ancillary and laboratory) are listed below for reference.     Microbiology: Recent Results (from the past 240 hours)  Culture, blood (Routine x 2)     Status: None (Preliminary result)   Collection Time: 02/02/23  2:41 PM   Specimen: Left Antecubital; Blood  Result Value Ref Range Status   Specimen Description   Final    LEFT ANTECUBITAL BOTTLES DRAWN AEROBIC AND ANAEROBIC   Special Requests Blood Culture adequate volume  Final   Culture   Final    NO GROWTH < 24 HOURS Performed at [facility], [address], [place], [place] [address]    Report Status PENDING  Incomplete  Culture, blood (Routine x 2)     Status: None (Preliminary result)   Collection Time: 02/02/23  2:41 PM   Specimen: Right Antecubital; Blood  Result Value Ref Range Status   Specimen Description   Final    RIGHT ANTECUBITAL BOTTLES DRAWN AEROBIC AND ANAEROBIC   Special Requests   Final    Blood Culture results may not be optimal due to an inadequate volume of blood received in culture bottles   Culture   Final    NO GROWTH < 24 HOURS Performed at [facility], [address], [place], [place] [address]    Report Status PENDING  Incomplete  SARS Coronavirus 2 by RT PCR (hospital order, performed in [facility] hospital lab) *cepheid single result test* Anterior Nasal Swab     Status: None   Collection Time: 02/02/23  9:34 PM   Specimen: Anterior Nasal Swab  Result Value Ref Range Status   SARS Coronavirus 2 by RT PCR NEGATIVE NEGATIVE Final    Comment: (NOTE) SARS-CoV-2 target nucleic acids are NOT DETECTED.  The SARS-CoV-2 RNA is generally detectable in upper and lower respiratory specimens during the acute phase of infection. The lowest concentration of SARS-CoV-2 viral copies this assay can detect is 250 copies / mL. A negative result does not preclude SARS-CoV-2 infection and should not be used as the sole basis for treatment or other patient management decisions.  A negative result may occur  with improper specimen collection / handling, submission of specimen other than nasopharyngeal swab, presence of viral mutation(s) within the areas targeted by this assay, and inadequate number of viral copies (<250 copies / mL). A negative result must be combined with clinical observations, patient history, and epidemiological information.  Fact Sheet for Patients:   roadlaptop.co.za  Fact Sheet for Healthcare Providers: http://kim-miller.com/  This test is not yet approved or  cleared by the [place]  FDA and has been authorized for detection and/or diagnosis of SARS-CoV-2 by FDA under an Emergency Use Authorization (EUA).  This EUA will remain in effect (meaning this test can be used) for the duration of the COVID-19 declaration under Section 564(b)(1) of the Act, 21 U.S.C. section 360bbb-3(b)(1), unless the authorization is terminated or revoked sooner.  Performed at [facility], [address] [place], [place] [address]      Labs: BNP (last 3 results) No results for input(s): BNP in the last 8760 hours. Basic Metabolic Panel: Recent Labs  Lab 02/02/23 1441 02/03/23 0446  NA 138 139  K 4.0 3.8  CL 105 108  CO2 23 21*  GLUCOSE 110* 176*  BUN 26* 26*  CREATININE 1.99* 1.69*  CALCIUM  9.3 8.8*   Liver Function Tests: Recent Labs  Lab 02/02/23 1441  AST 15  ALT 15  ALKPHOS 108  BILITOT 0.8  PROT 7.7  ALBUMIN 3.7   No results for input(s): LIPASE, AMYLASE in the last 168 hours. No results for input(s): AMMONIA in the last 168 hours. CBC: Recent Labs  Lab 02/02/23 1441 02/03/23 0446  WBC 7.5 4.9  NEUTROABS 5.3  --   HGB 13.5 11.5*  HCT 41.4 35.6*  MCV 97.0 99.2  PLT 184 165   Cardiac Enzymes: No results for input(s): CKTOTAL, CKMB, CKMBINDEX, TROPONINI in the last 168 hours. BNP: Invalid input(s): POCBNP CBG: No results for input(s): GLUCAP in the last 168 hours. D-Dimer No  results for input(s): DDIMER in the last 72 hours. Hgb A1c No results for input(s): HGBA1C in the last 72 hours. Lipid Profile No results for input(s): CHOL, HDL, LDLCALC, TRIG, CHOLHDL, LDLDIRECT in the last 72 hours. Thyroid  function studies No results for input(s): TSH, T4TOTAL, T3FREE, THYROIDAB in the last 72 hours.  Invalid input(s): FREET3 Anemia work up No results for input(s): VITAMINB12, FOLATE, FERRITIN, TIBC, IRON, RETICCTPCT in the last 72 hours. Urinalysis    Component Value Date/Time   COLORURINE YELLOW 02/02/2023 1922   APPEARANCEUR HAZY (A) 02/02/2023 1922   LABSPEC 1.018 02/02/2023 1922   PHURINE 5.0 02/02/2023 1922   GLUCOSEU NEGATIVE 02/02/2023 1922   HGBUR SMALL (A) 02/02/2023 1922   BILIRUBINUR NEGATIVE 02/02/2023 1922   KETONESUR NEGATIVE 02/02/2023 1922   PROTEINUR 30 (A) 02/02/2023 1922   UROBILINOGEN 0.2 04/23/2007 1113   NITRITE NEGATIVE 02/02/2023 1922   LEUKOCYTESUR NEGATIVE 02/02/2023 1922   Sepsis Labs Recent Labs  Lab 02/02/23 1441 02/03/23 0446  WBC 7.5 4.9   Microbiology Recent Results (from the past 240 hours)  Culture, blood (Routine x 2)     Status: None (Preliminary result)   Collection Time: 02/02/23  2:41 PM   Specimen: Left Antecubital; Blood  Result Value Ref Range Status   Specimen Description   Final    LEFT ANTECUBITAL BOTTLES DRAWN AEROBIC AND ANAEROBIC   Special Requests Blood Culture adequate volume  Final   Culture   Final    NO GROWTH < 24 HOURS Performed at [facility], [address] [place], [place] [address]    Report Status PENDING  Incomplete  Culture, blood (Routine x 2)     Status: None (Preliminary result)   Collection Time: 02/02/23  2:41 PM   Specimen: Right Antecubital; Blood  Result Value Ref Range Status   Specimen Description   Final    RIGHT ANTECUBITAL BOTTLES DRAWN AEROBIC AND ANAEROBIC   Special Requests   Final    Blood Culture results may not be optimal  due to an inadequate volume of blood received in culture bottles   Culture   Final    NO GROWTH < 24 HOURS Performed at [facility], [address] [place], [place] [address]    Report Status PENDING  Incomplete  SARS Coronavirus 2 by RT PCR (hospital order, performed in [facility] hospital lab) *cepheid single result test* Anterior Nasal Swab     Status: None   Collection Time: 02/02/23  9:34 PM   Specimen: Anterior Nasal Swab  Result Value Ref Range Status   SARS Coronavirus 2 by RT PCR NEGATIVE NEGATIVE Final    Comment: (NOTE) SARS-CoV-2 target nucleic acids are NOT DETECTED.  The SARS-CoV-2 RNA is generally detectable in upper and lower respiratory specimens during the acute phase of infection. The lowest concentration of SARS-CoV-2 viral copies this assay can detect is 250 copies / mL. A negative result does not preclude SARS-CoV-2 infection and should not be used as the sole basis for treatment or other patient management decisions.  A negative result may occur with improper specimen collection / handling, submission of specimen other than nasopharyngeal swab, presence of viral mutation(s) within the areas targeted by this assay, and inadequate number of viral copies (<250 copies / mL). A negative result must be combined with clinical observations, patient history, and epidemiological information.  Fact Sheet for Patients:   roadlaptop.co.za  Fact Sheet for Healthcare Providers: http://kim-miller.com/  This test is not yet approved or  cleared by the [place]  FDA and has been authorized for detection and/or diagnosis of SARS-CoV-2 by FDA under an Emergency Use Authorization (EUA).  This EUA will remain in effect (meaning this test can be used) for the duration of the COVID-19 declaration under Section 564(b)(1) of the Act, 21 U.S.C. section 360bbb-3(b)(1), unless the authorization is terminated or revoked  sooner.  Performed at [facility], [address] [place], [place] [address]    Time coordinating discharge:    SIGNED:  Afton Louder, MD  [facility] 02/03/2023, 10:41 AM How to contact the [facility] Attending or Consulting provider 7A - 7P or covering provider during after hours 7P -7A, for this patient?  Check the care team in [facility] and look for a) attending/consulting [facility] provider listed and b) the [facility] team listed Log into [email or web address] and use [facility] universal password to access. If you do not have the password, please contact the hospital operator. Locate the [facility] provider you are looking for under [facility] and page to a number that you can be directly reached. If you still have difficulty reaching the provider, please page the [facility] (Director on Call) for the Hospitalists listed on amion for assistance.

## 2023-02-03 NOTE — Discharge Instructions (Signed)
IMPORTANT INFORMATION: PAY CLOSE ATTENTION   PHYSICIAN DISCHARGE INSTRUCTIONS  Follow with Primary care provider  Patel, Rutwik K, MD  and other consultants as instructed by your Hospitalist Physician  SEEK MEDICAL CARE OR RETURN TO EMERGENCY ROOM IF SYMPTOMS COME BACK, WORSEN OR NEW PROBLEM DEVELOPS   Please note: You were cared for by a hospitalist during your hospital stay. Every effort will be made to forward records to your primary care provider.  You can request that your primary care provider send for your hospital records if they have not received them.  Once you are discharged, your primary care physician will handle any further medical issues. Please note that NO REFILLS for any discharge medications will be authorized once you are discharged, as it is imperative that you return to your primary care physician (or establish a relationship with a primary care physician if you do not have one) for your post hospital discharge needs so that they can reassess your need for medications and monitor your lab values.  Please get a complete blood count and chemistry panel checked by your Primary MD at your next visit, and again as instructed by your Primary MD.  Get Medicines reviewed and adjusted: Please take all your medications with you for your next visit with your Primary MD  Laboratory/radiological data: Please request your Primary MD to go over all hospital tests and procedure/radiological results at the follow up, please ask your primary care provider to get all Hospital records sent to his/her office.  In some cases, they will be blood work, cultures and biopsy results pending at the time of your discharge. Please request that your primary care provider follow up on these results.  If you are diabetic, please bring your blood sugar readings with you to your follow up appointment with primary care.    Please call and make your follow up appointments as soon as possible.    Also Note  the following: If you experience worsening of your admission symptoms, develop shortness of breath, life threatening emergency, suicidal or homicidal thoughts you must seek medical attention immediately by calling 911 or calling your MD immediately  if symptoms less severe.  You must read complete instructions/literature along with all the possible adverse reactions/side effects for all the Medicines you take and that have been prescribed to you. Take any new Medicines after you have completely understood and accpet all the possible adverse reactions/side effects.   Do not drive when taking Pain medications or sleeping medications (Benzodiazepines)  Do not take more than prescribed Pain, Sleep and Anxiety Medications. It is not advisable to combine anxiety,sleep and pain medications without talking with your primary care practitioner  Special Instructions: If you have smoked or chewed Tobacco  in the last 2 yrs please stop smoking, stop any regular Alcohol  and or any Recreational drug use.  Wear Seat belts while driving.  Do not drive if taking any narcotic, mind altering or controlled substances or recreational drugs or alcohol.       

## 2023-02-03 NOTE — Care Management CC44 (Signed)
 Condition Code 44 Documentation Completed  Patient Details  Name: STEPHAN DRAUGHN MRN: [id number] Date of Birth: 07-03-1950   Condition Code 44 given:  Yes Patient signature on Condition Code 44 notice:  Yes Documentation of 2 MD's agreement:  Yes Code 44 added to claim:  Yes    Rollo Petri, LCSW 02/03/2023, 11:02 AM

## 2023-02-03 NOTE — Progress Notes (Signed)
   02/03/23 1112  TOC Brief Assessment  Insurance and Status Reviewed  Patient has primary care physician Yes  Home environment has been reviewed from home  Prior level of function: independent  Prior/Current Home Services No current home services  Social Drivers of Health Review SDOH reviewed no interventions necessary  Readmission risk has been reviewed Yes  Transition of care needs no transition of care needs at this time    Transition of Care Department [facility]) has reviewed patient and no TOC needs have been identified at this time. We will continue to monitor patient advancement through interdisciplinary progression rounds. If new patient transition needs arise, please place a TOC consult.

## 2023-02-03 NOTE — Care Management Obs Status (Signed)
 MEDICARE OBSERVATION STATUS NOTIFICATION   Patient Details  Name: Kaitlin [name] MRN: [id number] Date of Birth: 11-16-1950   Medicare Observation Status Notification Given:  Yes    Elliot Gault, LCSW 02/03/2023, 11:02 AM

## 2023-02-03 NOTE — Hospital Course (Signed)
 73 y.o. female with medical history significant for COPD, ongoing tobacco abuse, coronary artery disease, peripheral artery disease, CKD 3. Patient presented to the ED with complaints of cough, and difficulty breathing over the past week.  Cough has been productive of greenish sputum.  She also reports sore throat, nasal congestion.  She currently smokes a pack of cigarettes daily.  She reports couple episodes of vomiting due to severity of cough, and poor oral intake over the past few days. She reports at baseline her blood pressure is usually on the lower side -with systolic that can be as low as 95.   ED Course: Tmax 98.9.  Heart rate 89-125.  Blood pressure systolic 84-118.  O2 sats down to 82% on room air, placed on 2-3 L. Lactic acid 0.9 > 7.  WBC 7.5.  Chest x-ray-atelectasis or airspace disease not excluded, consider repeat PA and lateral chest x-ray. IV ceftriaxone  and azithromycin  given.  Solu-Medrol  60 mg x 1 given.  1 L bolus given.

## 2023-02-03 NOTE — Plan of Care (Signed)

## 2023-02-04 ENCOUNTER — Telehealth: Payer: Self-pay

## 2023-02-04 NOTE — Telephone Encounter (Signed)
Appt scheduled pt aware  

## 2023-02-04 NOTE — Telephone Encounter (Signed)
 Copied from CRM [id number]. Topic: General - Other >> Feb 04, 2023 10:13 AM Deleta HERO wrote: Reason for CRM: The patient is wanting to determine if she can be seen by PCP for a hospital follow up. She was advised to be scheduled within 2 weeks from 01/05. Attempted to schedule the patient with Dr. Tobie, the earliest availability shown is 03/10. The patient also wants to know if she can receive refills for doxycycline  (VIBRAMYCIN ) 100 MG capsule, predniSONE  (DELTASONE ) 20 MG tablet, and cefdinir  (OMNICEF ) 300 MG capsule or if she would have to be seen prior to receiving the refills? She states she only has enough until 01/08. Callback #: [phone]

## 2023-02-06 ENCOUNTER — Encounter: Payer: Self-pay | Admitting: Internal Medicine

## 2023-02-06 ENCOUNTER — Ambulatory Visit (INDEPENDENT_AMBULATORY_CARE_PROVIDER_SITE_OTHER): Payer: Medicare Other | Admitting: Internal Medicine

## 2023-02-06 VITALS — BP 127/64 | HR 102 | Ht 60.0 in | Wt 121.4 lb

## 2023-02-06 DIAGNOSIS — N1832 Chronic kidney disease, stage 3b: Secondary | ICD-10-CM

## 2023-02-06 DIAGNOSIS — I2510 Atherosclerotic heart disease of native coronary artery without angina pectoris: Secondary | ICD-10-CM

## 2023-02-06 DIAGNOSIS — E782 Mixed hyperlipidemia: Secondary | ICD-10-CM | POA: Diagnosis not present

## 2023-02-06 DIAGNOSIS — I739 Peripheral vascular disease, unspecified: Secondary | ICD-10-CM | POA: Diagnosis not present

## 2023-02-06 DIAGNOSIS — J441 Chronic obstructive pulmonary disease with (acute) exacerbation: Secondary | ICD-10-CM | POA: Diagnosis not present

## 2023-02-06 DIAGNOSIS — Z09 Encounter for follow-up examination after completed treatment for conditions other than malignant neoplasm: Secondary | ICD-10-CM | POA: Diagnosis not present

## 2023-02-06 MED ORDER — BUDESONIDE-FORMOTEROL FUMARATE 160-4.5 MCG/ACT IN AERO: 2.0000 | INHALATION_SPRAY | Freq: Two times a day (BID) | RESPIRATORY_TRACT | 3 refills | Status: DC

## 2023-02-06 MED ORDER — METHYLPREDNISOLONE 4 MG PO TBPK: ORAL_TABLET | ORAL | 0 refills | Status: DC

## 2023-02-06 NOTE — Assessment & Plan Note (Addendum)
 Last BMP reviewed, GFR ranges around 35 now, last BMP showed stable GFR - 32 Followed by nephrology - advised to contact Dr. Wolfgang Phoenix On Lisinopril 2.5 mg now Needs to improve fluid intake, avoid soft drinks Avoid nephrotoxic agents including NSAIDs

## 2023-02-06 NOTE — Progress Notes (Signed)
 Established Patient Office Visit  Subjective:  Patient ID: Kaitlin [name], female    DOB: Jun 17, 1950  Age: 73 y.o. MRN: [id number]  CC:  Chief Complaint  Patient presents with   Hospitalization Follow-up    Hospital Follow up     HPI Kaitlin [name] is a 73 y.o. female with past medical history of CAD s/p stent placement, PAD, AAA, carotid stenosis s/p right carotid and arterectomy, HLD, COPD, CKD stage IIIb and tobacco abuse who presents for follow-up after recent hospitalization from 02/02/23-02/03/23.  She was admitted for an acute COPD exacerbation and treated with IV steroids and antibiotics and bronchodilators and fortunately she responded very fast to the therapy and she is currently on room air. She was given oral prednisone , doxycycline  and cefdinir  upon discharge.  She still complains of cough with clear expectoration, which has been slightly better compared to prior.  Denies any fever or chills.  She has been ambulating without needing oxygen currently.   Past Medical History:  Diagnosis Date   AAA (abdominal aortic aneurysm) (HCC)    Anemia    Arthritis    Asthma    CAD (coronary artery disease)    stents   Calculus of gallbladder without cholecystitis without obstruction    Carotid artery disease (HCC)    Chronic kidney disease, stage 3a (HCC)    COPD (chronic obstructive pulmonary disease) (HCC)    Emphysema of lung (HCC)    GERD (gastroesophageal reflux disease)    Hyperlipidemia    Hypertension    Iliac artery stenosis, right (HCC)    60%-70%   Myocardial infarction (HCC)    Normocytic anemia 01/26/2017   PAD (peripheral artery disease) (HCC) 12/14/2010   in the left vertebral, bilateral carotids    Past Surgical History:  Procedure Laterality Date   CARDIAC CATHETERIZATION  02/04/2005   A cypher 5.3 x 18 mm at 16 atmosphere of pressure in the mid LAD, this stent covered the proximal part of the LAD and also the mid LAD, this was then post dilated with  3.25 x 15 mm Quantum at 16 atmosphereof pressure for 45 seconds   CATARACT EXTRACTION Bilateral    CHOLECYSTECTOMY N/A 08/16/2017   Procedure: LAPAROSCOPIC CHOLECYSTECTOMY;  Surgeon: Mavis Anes, MD;  Location: [facility];  Service: General;  Laterality: N/A;   COLONOSCOPY N/A 03/01/2017   Procedure: COLONOSCOPY;  Surgeon: Harvey Margo CROME, MD;  Location: [facility];  Service: Endoscopy;  Laterality: N/A;  1:00pm   ESOPHAGOGASTRODUODENOSCOPY N/A 01/28/2017   Procedure: ESOPHAGOGASTRODUODENOSCOPY (EGD);  Surgeon: Albertus Gordy HERO, MD;  Location: [facility];  Service: Gastroenterology;  Laterality: N/A;   GIVENS CAPSULE STUDY N/A 03/12/2017   Procedure: GIVENS CAPSULE STUDY;  Surgeon: Harvey Margo CROME, MD;  Location: [facility];  Service: Endoscopy;  Laterality: N/A;  7:30am   TUBAL LIGATION      Family History  Problem Relation Age of Onset   Heart attack Mother    Hyperlipidemia Mother    Hypertension Mother    Diabetes Mother    Heart attack Father    Early death Father 44   Hyperlipidemia Father    Hypertension Father    Heart disease Sister    Hyperlipidemia Sister    Hypertension Sister    Diabetes Brother    Heart disease Sister    Hyperlipidemia Sister    Hypertension Sister    Heart attack Sister    Heart disease Sister    Hyperlipidemia Sister  Hypertension Sister    Heart attack Brother    Early death Brother 35   Hyperlipidemia Brother    Hypertension Brother    Heart attack Brother    Early death Brother 60   Hyperlipidemia Brother    Hypertension Brother    Cancer Maternal Aunt    Colon cancer Neg Hx    Colon polyps Neg Hx     Social History   Socioeconomic History   Marital status: Married    Spouse name: Debby   Number of children: 3   Years of education: 14   Highest education level: Not on file  Occupational History   Occupation: RETIRED    Comment: warehouse/textiles  Tobacco Use   Smoking status: Every Day    Current packs/day: 1.00     Average packs/day: 1 pack/day for 56.0 years (56.0 ttl pk-yrs)    Types: Cigarettes    Start date: 02/09/1967   Smokeless tobacco: Never  Vaping Use   Vaping status: Never Used  Substance and Sexual Activity   Alcohol use: No   Drug use: No   Sexual activity: Yes    Birth control/protection: Post-menopausal  Other Topics Concern   Not on file  Social History Narrative   Retired   Has an AA in [employer] with husband Debby for 23 years   Stays busy with home, gardens, likes to can   Social Drivers of Corporate Investment Banker Strain: Low Risk  (07/30/2022)   Overall Financial Resource Strain (CARDIA)    Difficulty of Paying Living Expenses: Not hard at all  Food Insecurity: No Food Insecurity (02/02/2023)   Hunger Vital Sign    Worried About Running Out of Food in the Last Year: Never true    Ran Out of Food in the Last Year: Never true  Transportation Needs: No Transportation Needs (02/02/2023)   PRAPARE - Administrator, Civil Service (Medical): No    Lack of Transportation (Non-Medical): No  Physical Activity: Sufficiently Active (07/30/2022)   Exercise Vital Sign    Days of Exercise per Week: 7 days    Minutes of Exercise per Session: 40 min  Stress: No Stress Concern Present (07/30/2022)   [employer] of Occupational Health - Occupational Stress Questionnaire    Feeling of Stress : Not at all  Social Connections: Moderately Isolated (02/03/2023)   Social Connection and Isolation Panel [NHANES]    Frequency of Communication with Friends and Family: More than three times a week    Frequency of Social Gatherings with Friends and Family: More than three times a week    Attends Religious Services: More than 4 times per year    Active Member of [employer] or Organizations: No    Attends Banker Meetings: Never    Marital Status: Divorced  Catering Manager Violence: Not At Risk (02/02/2023)   Humiliation, Afraid, Rape, and Kick questionnaire    Fear of  Current or Ex-Partner: No    Emotionally Abused: No    Physically Abused: No    Sexually Abused: No    Outpatient Medications Prior to Visit  Medication Sig Dispense Refill   albuterol  (VENTOLIN  HFA) 108 (90 Base) MCG/ACT inhaler Inhale 2 puffs into the lungs every 6 (six) hours as needed for wheezing or shortness of breath. 18 g 5   aspirin  81 MG chewable tablet Chew 81 mg by mouth daily.     atorvastatin  (LIPITOR) 80 MG tablet Take 1 tablet (80  mg total) by mouth daily. 90 tablet 3   cholecalciferol (VITAMIN D ) 1000 units tablet Take 2 Units by mouth daily.      clopidogrel  (PLAVIX ) 75 MG tablet TAKE 1 TABLET BY MOUTH EVERY DAY 90 tablet 1   ferrous sulfate 325 (65 FE) MG tablet Take 325 mg by mouth daily with breakfast.     guaiFENesin  (MUCINEX ) 600 MG 12 hr tablet Take 1 tablet (600 mg total) by mouth 2 (two) times daily for 3 days. 6 tablet 0   lisinopril  (ZESTRIL ) 2.5 MG tablet TAKE 1 TABLET BY MOUTH EVERY DAY 90 tablet 3   NITROSTAT  0.4 MG SL tablet PLACE 1 TABLET UNDER TONGUE EVERY 5 MINUTES FOR 3 DOSES AS NEEDED. (Patient taking differently: Place 0.4 mg under the tongue every 5 (five) minutes as needed for chest pain.) 25 tablet 3   vitamin B-12 (CYANOCOBALAMIN ) 500 MCG tablet Take 500 mcg by mouth daily.     dextromethorphan  (DELSYM ) 30 MG/5ML liquid Take 5 mLs (30 mg total) by mouth 2 (two) times daily as needed for cough. (Patient not taking: Reported on 02/06/2023) 89 mL 0   doxycycline  (VIBRAMYCIN ) 100 MG capsule Take 1 capsule (100 mg total) by mouth 2 (two) times daily for 3 days. (Patient not taking: Reported on 02/06/2023) 6 capsule 0   predniSONE  (DELTASONE ) 20 MG tablet Take 1 tablet (20 mg total) by mouth daily with breakfast for 4 days. (Patient not taking: Reported on 02/06/2023) 4 tablet 0   No facility-administered medications prior to visit.    No Known Allergies  ROS Review of Systems  Constitutional:  Negative for chills and fever.  HENT:  Negative for congestion,  sinus pressure, sinus pain and sore throat.   Eyes:  Negative for pain and discharge.  Respiratory:  Positive for cough. Negative for shortness of breath.   Cardiovascular:  Negative for chest pain and palpitations.  Gastrointestinal:  Negative for abdominal pain, constipation, diarrhea, nausea and vomiting.  Endocrine: Negative for polydipsia and polyuria.  Genitourinary:  Negative for dysuria and hematuria.  Musculoskeletal:  Negative for neck pain and neck stiffness.  Skin:  Negative for rash.  Neurological:  Negative for dizziness and weakness.  Psychiatric/Behavioral:  Negative for agitation and behavioral problems.       Objective:    Physical Exam Vitals reviewed.  Constitutional:      General: She is not in acute distress.    Appearance: She is not diaphoretic.  HENT:     Head: Normocephalic and atraumatic.     Nose: Nose normal.     Mouth/Throat:     Mouth: Mucous membranes are moist.  Eyes:     General: No scleral icterus.    Extraocular Movements: Extraocular movements intact.  Cardiovascular:     Rate and Rhythm: Normal rate and regular rhythm.     Pulses: Normal pulses.     Heart sounds: Normal heart sounds. No murmur heard. Pulmonary:     Breath sounds: Normal breath sounds. No wheezing or rales.  Musculoskeletal:     Cervical back: Neck supple. No tenderness.     Right lower leg: No edema.     Left lower leg: No edema.  Skin:    General: Skin is warm.     Findings: No rash.  Neurological:     General: No focal deficit present.     Mental Status: She is alert and oriented to person, place, and time.     Sensory: No sensory deficit.  Motor: No weakness.  Psychiatric:        Mood and Affect: Mood normal.        Behavior: Behavior normal.     BP 127/64 (BP Location: Right Arm, Patient Position: Sitting, Cuff Size: Normal)   Pulse (!) 102   Ht 5' (1.524 m)   Wt 121 lb 6.4 oz (55.1 kg)   SpO2 97%   BMI 23.71 kg/m  Wt Readings from Last 3  Encounters:  02/06/23 121 lb 6.4 oz (55.1 kg)  02/02/23 120 lb 3.2 oz (54.5 kg)  11/02/22 120 lb 3.2 oz (54.5 kg)    Lab Results  Component Value Date   TSH 0.608 08/30/2020   Lab Results  Component Value Date   WBC 4.9 02/03/2023   HGB 11.5 (L) 02/03/2023   HCT 35.6 (L) 02/03/2023   MCV 99.2 02/03/2023   PLT 165 02/03/2023   Lab Results  Component Value Date   NA 139 02/03/2023   K 3.8 02/03/2023   CO2 21 (L) 02/03/2023   GLUCOSE 176 (H) 02/03/2023   BUN 26 (H) 02/03/2023   CREATININE 1.69 (H) 02/03/2023   BILITOT 0.8 02/02/2023   ALKPHOS 108 02/02/2023   AST 15 02/02/2023   ALT 15 02/02/2023   PROT 7.7 02/02/2023   ALBUMIN 3.7 02/02/2023   CALCIUM  8.8 (L) 02/03/2023   ANIONGAP 10 02/03/2023   EGFR 34 (L) 10/17/2020   Lab Results  Component Value Date   CHOL 137 02/09/2022   Lab Results  Component Value Date   HDL 43 02/09/2022   Lab Results  Component Value Date   LDLCALC 58 02/09/2022   Lab Results  Component Value Date   TRIG 178 (H) 02/09/2022   Lab Results  Component Value Date   CHOLHDL 3.2 02/09/2022   Lab Results  Component Value Date   HGBA1C 5.2 01/26/2017      Assessment & Plan:   Problem List Items Addressed This Visit       Cardiovascular and Mediastinum   CAD (coronary artery disease)   S/p stent placement Followed by Cardiology On Aspirin , Plavix  and statin      Relevant Orders   TSH   PAD (peripheral artery disease) (HCC)   No current leg claudication symptoms Followed by Cardiology On Aspirin , Plavix  and statin        Respiratory   COPD with acute exacerbation (HCC) - Primary   Completed IV followed by oral antibiotics She completed oral Prednisone  20 mg once daily today - Since she still has cough, started Medrol  dosepak Mucinex  as needed for cough Started Symbicort  as maintenance inhaler Continue albuterol  as needed for dyspnea or wheezing      Relevant Medications   methylPREDNISolone  (MEDROL  DOSEPAK) 4  MG TBPK tablet   budesonide -formoterol  (SYMBICORT ) 160-4.5 MCG/ACT inhaler     Genitourinary   Stage 3b chronic kidney disease (HCC)   Last BMP reviewed, GFR ranges around 35 now, last BMP showed stable GFR - 32 Followed by nephrology - advised to contact Dr. Rachele On Lisinopril  2.5 mg now Needs to improve fluid intake, avoid soft drinks Avoid nephrotoxic agents including NSAIDs        Other   Hyperlipidemia   On atorvastatin  80 mg QD      Relevant Orders   Lipid Profile   [facility]   Hospital discharge follow-up   Hospital chart reviewed, including discharge summary Medications reconciled and reviewed with the patient in detail       Meds  ordered this encounter  Medications   methylPREDNISolone  (MEDROL  DOSEPAK) 4 MG TBPK tablet    Sig: Take as package instructions.    Dispense:  1 each    Refill:  0   budesonide -formoterol  (SYMBICORT ) 160-4.5 MCG/ACT inhaler    Sig: Inhale 2 puffs into the lungs 2 (two) times daily.    Dispense:  1 each    Refill:  3    Follow-up: Return if symptoms worsen or fail to improve.    Kaitlin MARLA Blanch, MD

## 2023-02-06 NOTE — Assessment & Plan Note (Signed)
 S/p stent placement Followed by Cardiology On Aspirin, Plavix and statin

## 2023-02-06 NOTE — Patient Instructions (Signed)
 Please start taking Prednisone  as prescribed.  Please start using Symbicort  twice daily regularly.  Please continue to take medications as prescribed.  Please continue to follow low salt diet and ambulate as tolerated.  Please get fasting blood tests done before the next visit.

## 2023-02-07 LAB — CULTURE, BLOOD (ROUTINE X 2): Special Requests: ADEQUATE

## 2023-02-07 NOTE — Assessment & Plan Note (Signed)
 No current leg claudication symptoms Followed by Cardiology On Aspirin, Plavix and statin

## 2023-02-07 NOTE — Assessment & Plan Note (Signed)
 Hospital chart reviewed, including discharge summary Medications reconciled and reviewed with the patient in detail

## 2023-02-07 NOTE — Assessment & Plan Note (Signed)
 On atorvastatin 80 mg QD ?

## 2023-02-07 NOTE — Assessment & Plan Note (Signed)
 Completed IV followed by oral antibiotics She completed oral Prednisone  20 mg once daily today - Since she still has cough, started Medrol  dosepak Mucinex  as needed for cough Started Symbicort  as maintenance inhaler Continue albuterol  as needed for dyspnea or wheezing

## 2023-02-11 NOTE — Addendum Note (Signed)
 Addended byTrena Platt on: 02/11/2023 08:52 AM   Modules accepted: Level of Service

## 2023-02-13 ENCOUNTER — Ambulatory Visit (HOSPITAL_COMMUNITY)
Admission: RE | Admit: 2023-02-13 | Discharge: 2023-02-13 | Disposition: A | Discharge status: Home / Self Care | Payer: Medicare Other | Source: Ambulatory Visit | Attending: Physician Assistant | Admitting: Physician Assistant

## 2023-02-13 DIAGNOSIS — F17210 Nicotine dependence, cigarettes, uncomplicated: Secondary | ICD-10-CM | POA: Diagnosis not present

## 2023-02-13 DIAGNOSIS — Z87891 Personal history of nicotine dependence: Secondary | ICD-10-CM | POA: Insufficient documentation

## 2023-02-16 ENCOUNTER — Other Ambulatory Visit: Payer: Self-pay | Admitting: Internal Medicine

## 2023-02-16 ENCOUNTER — Other Ambulatory Visit: Payer: Self-pay | Admitting: Cardiology

## 2023-02-16 DIAGNOSIS — E782 Mixed hyperlipidemia: Secondary | ICD-10-CM

## 2023-02-17 NOTE — Progress Notes (Signed)
Dr. Allena Katz - please see attached CT report (lung cancer screening), for potentially clinically significant findings unrelated to any malignancy.  In particular, report made mention of "calcifications of the aortic valve. Echocardiographic correlation for evaluation of potential valvular dysfunction may be warranted if clinically indicated." Thanks much! -Shatavia Santor

## 2023-02-18 NOTE — Progress Notes (Signed)
Patient notified of LDCT Lung Cancer Screening Results via mail with the recommendation to follow-up in 12 months. Patient's referring provider has been sent a copy of results. Results are as follows:   IMPRESSION: 1. Lung-RADS 2S, benign appearance or behavior. Continue annual screening with low-dose chest CT without contrast in 12 months. 2. The "S" modifier above refers to potentially clinically significant non lung cancer related findings. Specifically, there is aortic atherosclerosis, in addition to left main and three-vessel coronary artery disease. Please note that although the presence of coronary artery calcium documents the presence of coronary artery disease, the severity of this disease and any potential stenosis cannot be assessed on this non-gated CT examination. Assessment for potential risk factor modification, dietary therapy or pharmacologic therapy may be warranted, if clinically indicated. 3. Mild diffuse bronchial wall thickening with mild centrilobular and paraseptal emphysema; imaging findings suggestive of underlying COPD. 4. There are calcifications of the aortic valve. Echocardiographic correlation for evaluation of potential valvular dysfunction may be warranted if clinically indicated.   Aortic Atherosclerosis (ICD10-I70.0) and Emphysema (ICD10-J43.9).

## 2023-02-21 DIAGNOSIS — I2510 Atherosclerotic heart disease of native coronary artery without angina pectoris: Secondary | ICD-10-CM | POA: Diagnosis not present

## 2023-02-21 DIAGNOSIS — R809 Proteinuria, unspecified: Secondary | ICD-10-CM | POA: Diagnosis not present

## 2023-02-21 DIAGNOSIS — Z23 Encounter for immunization: Secondary | ICD-10-CM | POA: Diagnosis not present

## 2023-02-21 DIAGNOSIS — E782 Mixed hyperlipidemia: Secondary | ICD-10-CM | POA: Diagnosis not present

## 2023-02-21 DIAGNOSIS — E870 Hyperosmolality and hypernatremia: Secondary | ICD-10-CM | POA: Diagnosis not present

## 2023-02-21 DIAGNOSIS — I129 Hypertensive chronic kidney disease with stage 1 through stage 4 chronic kidney disease, or unspecified chronic kidney disease: Secondary | ICD-10-CM | POA: Diagnosis not present

## 2023-02-21 DIAGNOSIS — N1832 Chronic kidney disease, stage 3b: Secondary | ICD-10-CM | POA: Diagnosis not present

## 2023-02-22 LAB — LIPID PANEL
Chol/HDL Ratio: 3.2 {ratio} (ref 0.0–4.4)
Cholesterol, Total: 173 mg/dL (ref 100–199)
HDL: 54 mg/dL (ref >39)
LDL Chol Calc (NIH): 94 mg/dL (ref 0–99)
Triglycerides: 141 mg/dL (ref 0–149)
VLDL Cholesterol Cal: 25 mg/dL (ref 5–40)

## 2023-02-22 LAB — TSH: TSH: 1.08 u[IU]/mL (ref 0.450–4.500)

## 2023-02-25 ENCOUNTER — Ambulatory Visit (INDEPENDENT_AMBULATORY_CARE_PROVIDER_SITE_OTHER): Payer: Medicare Other | Admitting: Internal Medicine

## 2023-02-25 ENCOUNTER — Encounter: Payer: Self-pay | Admitting: Internal Medicine

## 2023-02-25 VITALS — BP 102/60 | HR 107 | Ht 60.0 in | Wt 120.0 lb

## 2023-02-25 DIAGNOSIS — E782 Mixed hyperlipidemia: Secondary | ICD-10-CM

## 2023-02-25 DIAGNOSIS — I739 Peripheral vascular disease, unspecified: Secondary | ICD-10-CM | POA: Diagnosis not present

## 2023-02-25 DIAGNOSIS — I714 Abdominal aortic aneurysm, without rupture, unspecified: Secondary | ICD-10-CM

## 2023-02-25 DIAGNOSIS — Z720 Tobacco use: Secondary | ICD-10-CM | POA: Diagnosis not present

## 2023-02-25 DIAGNOSIS — N1832 Chronic kidney disease, stage 3b: Secondary | ICD-10-CM

## 2023-02-25 DIAGNOSIS — J432 Centrilobular emphysema: Secondary | ICD-10-CM | POA: Diagnosis not present

## 2023-02-25 DIAGNOSIS — I2510 Atherosclerotic heart disease of native coronary artery without angina pectoris: Secondary | ICD-10-CM

## 2023-02-25 MED ORDER — EZETIMIBE 10 MG PO TABS: 10.0000 mg | ORAL_TABLET | Freq: Every day | ORAL | 3 refills | Status: DC

## 2023-02-25 NOTE — Assessment & Plan Note (Signed)
On atorvastatin 80 mg once daily Lipid profile reviewed - LDL not at goal Added Zetia 10 mg QD

## 2023-02-25 NOTE — Progress Notes (Signed)
Established Patient Office Visit  Subjective:  Patient ID: Kaitlin [name], female    DOB: 07/06/1950  Age: 73 y.o. MRN: [id number]  CC:  Chief Complaint  Patient presents with   COPD   Hyperlipidemia   Hypertension    HPI Kaitlin [name] is a 73 y.o. female with past medical history of CAD s/p stent placement, PAD, AAA, carotid stenosis s/p right carotid and arterectomy, HLD, COPD, CKD stage IIIb and tobacco abuse who presents for f/u of her chronic medical conditions.  She sees cardiologist for history of CAD, PAD, AAA and carotid stenosis.  She is on aspirin, Plavix and statin.  She denies any chest pain, dyspnea, LE swelling or leg claudication.  She has seen Dr. Wolfgang Phoenix for CKD stage IIIb.  Last BMP from chart has been reviewed and discussed with the patient.  She admits that she needs to improve fluid intake as she has only coffee (3 cups) as fluid intake in a day. She denies any dysuria or hematuria currently.  Denies any urinary hesitance or resistance.  She needs follow-up visit with Dr. Wolfgang Phoenix.  COPD: She has started using Symbicort, and reports an improvement in her chronic cough.  She has used albuterol occasionally for dyspnea.  She denies any fever, chills or wheezing currently.     Past Medical History:  Diagnosis Date   AAA (abdominal aortic aneurysm) (HCC)    Anemia    Arthritis    Asthma    CAD (coronary artery disease)    stents   Calculus of gallbladder without cholecystitis without obstruction    Carotid artery disease (HCC)    Chronic kidney disease, stage 3a (HCC)    COPD (chronic obstructive pulmonary disease) (HCC)    Emphysema of lung (HCC)    GERD (gastroesophageal reflux disease)    Hyperlipidemia    Hypertension    Iliac artery stenosis, right (HCC)    60%-70%   Myocardial infarction (HCC)    Normocytic anemia 01/26/2017   PAD (peripheral artery disease) (HCC) 12/14/2010   in the left vertebral, bilateral carotids    Past Surgical  History:  Procedure Laterality Date   CARDIAC CATHETERIZATION  02/04/2005   A cypher 5.3 x 18 mm at 16 atmosphere of pressure in the mid LAD, this stent covered the proximal part of the LAD and also the mid LAD, this was then post dilated with 3.25 x 15 mm Quantum at 16 atmosphereof pressure for 45 seconds   CATARACT EXTRACTION Bilateral    CHOLECYSTECTOMY N/A 08/16/2017   Procedure: LAPAROSCOPIC CHOLECYSTECTOMY;  Surgeon: Franky Macho, MD;  Location: [facility];  Service: General;  Laterality: N/A;   COLONOSCOPY N/A 03/01/2017   Procedure: COLONOSCOPY;  Surgeon: West Bali, MD;  Location: [facility];  Service: Endoscopy;  Laterality: N/A;  1:00pm   ESOPHAGOGASTRODUODENOSCOPY N/A 01/28/2017   Procedure: ESOPHAGOGASTRODUODENOSCOPY (EGD);  Surgeon: Beverley Fiedler, MD;  Location: [facility] ENDOSCOPY;  Service: Gastroenterology;  Laterality: N/A;   GIVENS CAPSULE STUDY N/A 03/12/2017   Procedure: GIVENS CAPSULE STUDY;  Surgeon: West Bali, MD;  Location: [facility];  Service: Endoscopy;  Laterality: N/A;  7:30am   TUBAL LIGATION      Family History  Problem Relation Age of Onset   Heart attack Mother    Hyperlipidemia Mother    Hypertension Mother    Diabetes Mother    Heart attack Father    Early death Father 57   Hyperlipidemia Father    Hypertension Father  Heart disease Sister    Hyperlipidemia Sister    Hypertension Sister    Diabetes Brother    Heart disease Sister    Hyperlipidemia Sister    Hypertension Sister    Heart attack Sister    Heart disease Sister    Hyperlipidemia Sister    Hypertension Sister    Heart attack Brother    Early death Brother 64   Hyperlipidemia Brother    Hypertension Brother    Heart attack Brother    Early death Brother 29   Hyperlipidemia Brother    Hypertension Brother    Cancer Maternal Aunt    Colon cancer Neg Hx    Colon polyps Neg Hx     Social History   Socioeconomic History   Marital status: Married    Spouse name: Maisie Fus    Number of children: 3   Years of education: 14   Highest education level: Not on file  Occupational History   Occupation: RETIRED    Comment: warehouse/textiles  Tobacco Use   Smoking status: Every Day    Current packs/day: 1.00    Average packs/day: 1 pack/day for 56.0 years (56.0 ttl pk-yrs)    Types: Cigarettes    Start date: 02/09/1967   Smokeless tobacco: Never  Vaping Use   Vaping status: Never Used  Substance and Sexual Activity   Alcohol use: No   Drug use: No   Sexual activity: Yes    Birth control/protection: Post-menopausal  Other Topics Concern   Not on file  Social History Narrative   Retired   Has an AA in [employer] with husband Maisie Fus for 23 years   Stays busy with home, gardens, likes to can   Social Drivers of Corporate investment banker Strain: Low Risk  (07/30/2022)   Overall Financial Resource Strain (CARDIA)    Difficulty of Paying Living Expenses: Not hard at all  Food Insecurity: No Food Insecurity (02/02/2023)   Hunger Vital Sign    Worried About Running Out of Food in the Last Year: Never true    Ran Out of Food in the Last Year: Never true  Transportation Needs: No Transportation Needs (02/02/2023)   PRAPARE - Administrator, Civil Service (Medical): No    Lack of Transportation (Non-Medical): No  Physical Activity: Sufficiently Active (07/30/2022)   Exercise Vital Sign    Days of Exercise per Week: 7 days    Minutes of Exercise per Session: 40 min  Stress: No Stress Concern Present (07/30/2022)   [employer] of Occupational Health - Occupational Stress Questionnaire    Feeling of Stress : Not at all  Social Connections: Moderately Isolated (02/03/2023)   Social Connection and Isolation Panel [NHANES]    Frequency of Communication with Friends and Family: More than three times a week    Frequency of Social Gatherings with Friends and Family: More than three times a week    Attends Religious Services: More than 4 times per year     Active Member of [employer] or Organizations: No    Attends Banker Meetings: Never    Marital Status: Divorced  Catering manager Violence: Not At Risk (02/02/2023)   Humiliation, Afraid, Rape, and Kick questionnaire    Fear of Current or Ex-Partner: No    Emotionally Abused: No    Physically Abused: No    Sexually Abused: No    Outpatient Medications Prior to Visit  Medication Sig Dispense Refill   albuterol (  VENTOLIN HFA) 108 (90 Base) MCG/ACT inhaler Inhale 2 puffs into the lungs every 6 (six) hours as needed for wheezing or shortness of breath. 18 g 5   aspirin 81 MG chewable tablet Chew 81 mg by mouth daily.     atorvastatin (LIPITOR) 80 MG tablet TAKE 1 TABLET BY MOUTH EVERY DAY 90 tablet 3   budesonide-formoterol (SYMBICORT) 160-4.5 MCG/ACT inhaler Inhale 2 puffs into the lungs 2 (two) times daily. 1 each 3   cholecalciferol (VITAMIN D) 1000 units tablet Take 2 Units by mouth daily.      clopidogrel (PLAVIX) 75 MG tablet TAKE 1 TABLET BY MOUTH EVERY DAY 90 tablet 1   dextromethorphan (DELSYM) 30 MG/5ML liquid Take 5 mLs (30 mg total) by mouth 2 (two) times daily as needed for cough. 89 mL 0   ferrous sulfate 325 (65 FE) MG tablet Take 325 mg by mouth daily with breakfast.     lisinopril (ZESTRIL) 2.5 MG tablet TAKE 1 TABLET BY MOUTH EVERY DAY 90 tablet 3   NITROSTAT 0.4 MG SL tablet PLACE 1 TABLET UNDER TONGUE EVERY 5 MINUTES FOR 3 DOSES AS NEEDED. (Patient taking differently: Place 0.4 mg under the tongue every 5 (five) minutes as needed for chest pain.) 25 tablet 3   vitamin B-12 (CYANOCOBALAMIN) 500 MCG tablet Take 500 mcg by mouth daily.     methylPREDNISolone (MEDROL DOSEPAK) 4 MG TBPK tablet Take as package instructions. 1 each 0   No facility-administered medications prior to visit.    No Known Allergies  ROS Review of Systems  Constitutional:  Negative for chills and fever.  HENT:  Negative for congestion, sinus pressure, sinus pain and sore throat.   Eyes:   Negative for pain and discharge.  Respiratory:  Negative for cough and shortness of breath.   Cardiovascular:  Negative for chest pain and palpitations.  Gastrointestinal:  Negative for abdominal pain, diarrhea, nausea and vomiting.  Endocrine: Negative for polydipsia and polyuria.  Genitourinary:  Negative for dysuria and hematuria.  Musculoskeletal:  Negative for neck pain and neck stiffness.  Skin:  Negative for rash.  Neurological:  Negative for dizziness and weakness.  Psychiatric/Behavioral:  Negative for agitation and behavioral problems.       Objective:    Physical Exam Vitals reviewed.  Constitutional:      General: She is not in acute distress.    Appearance: She is not diaphoretic.  HENT:     Head: Normocephalic and atraumatic.     Nose: Nose normal.     Mouth/Throat:     Mouth: Mucous membranes are moist.  Eyes:     General: No scleral icterus.    Extraocular Movements: Extraocular movements intact.  Cardiovascular:     Rate and Rhythm: Normal rate and regular rhythm.     Pulses: Normal pulses.     Heart sounds: Normal heart sounds. No murmur heard. Pulmonary:     Breath sounds: Normal breath sounds. No wheezing or rales.  Musculoskeletal:     Cervical back: Neck supple. No tenderness.     Right lower leg: No edema.     Left lower leg: No edema.  Skin:    General: Skin is warm.     Findings: No rash.  Neurological:     General: No focal deficit present.     Mental Status: She is alert and oriented to person, place, and time.     Sensory: No sensory deficit.     Motor: No weakness.  Psychiatric:  Mood and Affect: Mood normal.        Behavior: Behavior normal.     BP 102/60 (BP Location: Left Arm)   Pulse (!) 107   Ht 5' (1.524 m)   Wt 120 lb 0.6 oz (54.4 kg)   SpO2 93%   BMI 23.44 kg/m  Wt Readings from Last 3 Encounters:  02/25/23 120 lb 0.6 oz (54.4 kg)  02/06/23 121 lb 6.4 oz (55.1 kg)  02/02/23 120 lb 3.2 oz (54.5 kg)    Lab  Results  Component Value Date   TSH 1.080 02/21/2023   Lab Results  Component Value Date   WBC 4.9 02/03/2023   HGB 11.5 (L) 02/03/2023   HCT 35.6 (L) 02/03/2023   MCV 99.2 02/03/2023   PLT 165 02/03/2023   Lab Results  Component Value Date   NA 139 02/03/2023   K 3.8 02/03/2023   CO2 21 (L) 02/03/2023   GLUCOSE 176 (H) 02/03/2023   BUN 26 (H) 02/03/2023   CREATININE 1.69 (H) 02/03/2023   BILITOT 0.8 02/02/2023   ALKPHOS 108 02/02/2023   AST 15 02/02/2023   ALT 15 02/02/2023   PROT 7.7 02/02/2023   ALBUMIN 3.7 02/02/2023   CALCIUM 8.8 (L) 02/03/2023   ANIONGAP 10 02/03/2023   EGFR 34 (L) 10/17/2020   Lab Results  Component Value Date   CHOL 173 02/21/2023   Lab Results  Component Value Date   HDL 54 02/21/2023   Lab Results  Component Value Date   LDLCALC 94 02/21/2023   Lab Results  Component Value Date   TRIG 141 02/21/2023   Lab Results  Component Value Date   CHOLHDL 3.2 02/21/2023   Lab Results  Component Value Date   HGBA1C 5.2 01/26/2017      Assessment & Plan:   Problem List Items Addressed This Visit       Cardiovascular and Mediastinum   CAD (coronary artery disease)   S/p stent placement Followed by Cardiology On Aspirin, Plavix and statin      Relevant Medications   ezetimibe (ZETIA) 10 MG tablet   PAD (peripheral artery disease) (HCC)   No current leg claudication symptoms Followed by Cardiology On Aspirin, Plavix and statin      Relevant Medications   ezetimibe (ZETIA) 10 MG tablet   AAA (abdominal aortic aneurysm) (HCC) - Primary   Stable in size, last US abdomen reviewed Recent CT chest reviewed, showed stable sized infrarenal AAA Next US abdomen for AAA follow-up in 06/25      Relevant Medications   ezetimibe (ZETIA) 10 MG tablet     Respiratory   Emphysema lung (HCC)   Well controlled currently Continue Symbicort as maintenance inhaler Uses albuterol as needed CT chest reviewed, discussed with the patient         Genitourinary   Stage 3b chronic kidney disease (HCC)   Last BMP reviewed, GFR ranges around 35 now, last BMP showed stable GFR - 33 Followed by nephrology - advised to contact Dr. Wolfgang Phoenix On Lisinopril 2.5 mg now Needs to improve fluid intake, avoid soft drinks Avoid nephrotoxic agents including NSAIDs        Other   Hyperlipidemia   On atorvastatin 80 mg once daily Lipid profile reviewed - LDL not at goal Added Zetia 10 mg QD       Relevant Medications   ezetimibe (ZETIA) 10 MG tablet   Tobacco abuse   Smokes about 0.75 pack per day  Asked about quitting:  confirms that she currently smokes cigarettes Advise to quit smoking: Educated about QUITTING to reduce the risk of cancer, cardio and cerebrovascular disease. Assess willingness: Unwilling to quit at this time, but is working on cutting back. Assist with counseling and pharmacotherapy: Counseled and literature provided. Arrange for follow up: Follow up in 3 months and continue to offer help.        Meds ordered this encounter  Medications   ezetimibe (ZETIA) 10 MG tablet    Sig: Take 1 tablet (10 mg total) by mouth daily.    Dispense:  90 tablet    Refill:  3     Follow-up: Return in about 6 months (around 08/25/2023) for HTN and COPD.    Anabel Halon, MD

## 2023-02-25 NOTE — Assessment & Plan Note (Signed)
Stable in size, last US abdomen reviewed Recent CT chest reviewed, showed stable sized infrarenal AAA Next US abdomen for AAA follow-up in 06/25

## 2023-02-25 NOTE — Assessment & Plan Note (Addendum)
Well controlled currently Continue Symbicort as maintenance inhaler Uses albuterol as needed CT chest reviewed, discussed with the patient

## 2023-02-25 NOTE — Assessment & Plan Note (Signed)
No current leg claudication symptoms Followed by Cardiology On Aspirin, Plavix and statin

## 2023-02-25 NOTE — Patient Instructions (Signed)
Please start taking Ezetimibe as prescribed for cholesterol. Continue taking Atorvastatin.  Please take CoQ10 - 100 mg once daily.  Please continue to take medications as prescribed.  Please continue to follow low salt diet and perform moderate exercise/walking as tolerated.

## 2023-02-25 NOTE — Assessment & Plan Note (Signed)
Smokes about 0.75 pack per day  Asked about quitting: confirms that she currently smokes cigarettes Advise to quit smoking: Educated about QUITTING to reduce the risk of cancer, cardio and cerebrovascular disease. Assess willingness: Unwilling to quit at this time, but is working on cutting back. Assist with counseling and pharmacotherapy: Counseled and literature provided. Arrange for follow up: Follow up in 3 months and continue to offer help.

## 2023-02-25 NOTE — Assessment & Plan Note (Signed)
S/p stent placement Followed by Cardiology On Aspirin, Plavix and statin

## 2023-02-25 NOTE — Assessment & Plan Note (Addendum)
Last BMP reviewed, GFR ranges around 35 now, last BMP showed stable GFR - 33 Followed by nephrology - advised to contact Dr. Wolfgang Phoenix On Lisinopril 2.5 mg now Needs to improve fluid intake, avoid soft drinks Avoid nephrotoxic agents including NSAIDs

## 2023-03-20 DIAGNOSIS — N1832 Chronic kidney disease, stage 3b: Secondary | ICD-10-CM | POA: Diagnosis not present

## 2023-03-20 DIAGNOSIS — R809 Proteinuria, unspecified: Secondary | ICD-10-CM | POA: Diagnosis not present

## 2023-03-20 DIAGNOSIS — I2510 Atherosclerotic heart disease of native coronary artery without angina pectoris: Secondary | ICD-10-CM | POA: Diagnosis not present

## 2023-03-20 DIAGNOSIS — I129 Hypertensive chronic kidney disease with stage 1 through stage 4 chronic kidney disease, or unspecified chronic kidney disease: Secondary | ICD-10-CM | POA: Diagnosis not present

## 2023-05-09 ENCOUNTER — Ambulatory Visit: Payer: Medicare Other | Attending: Cardiology | Admitting: Cardiology

## 2023-05-09 ENCOUNTER — Encounter: Payer: Self-pay | Admitting: Cardiology

## 2023-05-09 VITALS — BP 110/55 | HR 98 | Ht 61.0 in | Wt 125.0 lb

## 2023-05-09 DIAGNOSIS — I10 Essential (primary) hypertension: Secondary | ICD-10-CM | POA: Diagnosis not present

## 2023-05-09 DIAGNOSIS — I2510 Atherosclerotic heart disease of native coronary artery without angina pectoris: Secondary | ICD-10-CM | POA: Insufficient documentation

## 2023-05-09 DIAGNOSIS — I6523 Occlusion and stenosis of bilateral carotid arteries: Secondary | ICD-10-CM | POA: Diagnosis not present

## 2023-05-09 DIAGNOSIS — I714 Abdominal aortic aneurysm, without rupture, unspecified: Secondary | ICD-10-CM | POA: Insufficient documentation

## 2023-05-09 DIAGNOSIS — I739 Peripheral vascular disease, unspecified: Secondary | ICD-10-CM | POA: Diagnosis not present

## 2023-05-09 DIAGNOSIS — E782 Mixed hyperlipidemia: Secondary | ICD-10-CM | POA: Diagnosis not present

## 2023-05-09 MED ORDER — ROSUVASTATIN CALCIUM 40 MG PO TABS: 40.0000 mg | ORAL_TABLET | Freq: Every day | ORAL | 3 refills | Status: DC

## 2023-05-09 MED ORDER — NITROGLYCERIN 0.4 MG SL SUBL: 0.4000 mg | SUBLINGUAL_TABLET | SUBLINGUAL | 3 refills | Status: AC | PRN

## 2023-05-09 NOTE — Patient Instructions (Signed)
 Medication Instructions:  Your physician has recommended you make the following change in your medication:   -Stop Atorvastatin -Start Rosuvastatin 40 mg tablet once daily    Labwork: None  Testing/Procedures: abdominal aortic aneurysm ultrasound   Follow-Up: Follow up with Dr. Wyline Mood or APP in 6 months.    Any Other Special Instructions Will Be Listed Below (If Applicable).     If you need a refill on your cardiac medications before your next appointment, please call your pharmacy.

## 2023-05-09 NOTE — Progress Notes (Signed)
 Clinical Summary Ms. Keach is a 73 y.o.female seen today for follow up of the following medical problems.   1. CAD   - w/ DES to LAD and BMSx2 to RCA Jan 2007, reported normal LV function from notes.   - the RCA PCI was complicated by dissection which was also stented     - was temporaily off ASA and plavix due to severe anemia requiring transfusion. Extensive GI workup without clear source of bleeding - back on ASA and plavix now.     - no recent chest pains, no SOB/DOE - compliant with meds       2. PAD   - 60-70% right external iliac artery stenosis by previous cath   - leg cramps but occurs at rest, primarily in the feet. Not consistent with claudication         3. Carotid stenosis   - prior right carotid endarterectomy      - stable follow up US 09/2017. - no neuro symptoms  05/2019 carotid US no significant disease - 03/2022 mild bilateral plaque - would repeat in 2026       4. Hyperlipidemia    - Jan 2021 TC 136 HDL 44 TG 361 LDL 54 - 07/2019 TC 112 HDL 40 TG 257 LDL 42 - Jan 2024 TC 137 TG 178 HDL 43 LDL 58 - Jan 2025 TC 173 TG 141 HDL 54 LDL 94      5. AAA 07/2014 small AAA 3 x 3.2 cm.  - 12/2016 3.2 cm and stable.   08/2018 AAA 3.5 cm   06/2020 3.4 cm AAA, repeat 3 years in 2025 - due for repeat US   6. Anemia - previously severe as low as 6.7, required transfusion - followed by hematology. From notes thought to be related to CKD - GI workup with EGD showed chronic gastritis, colonscopy no clear bleeding source.       7. CKD - followed by Dr Wolfgang Phoenix  8. COPD - admit Jan 2025 with COPD exacerbation     SH: just retired from Naval architect job. Spends most of time with great grandchildren (5 total). Works part time IT consultant. Past Medical History:  Diagnosis Date   AAA (abdominal aortic aneurysm) (HCC)    Anemia    Arthritis    Asthma    CAD (coronary artery disease)    stents   Calculus of gallbladder without cholecystitis without  obstruction    Carotid artery disease (HCC)    Chronic kidney disease, stage 3a (HCC)    COPD (chronic obstructive pulmonary disease) (HCC)    Emphysema of lung (HCC)    GERD (gastroesophageal reflux disease)    Hyperlipidemia    Hypertension    Iliac artery stenosis, right (HCC)    60%-70%   Myocardial infarction (HCC)    Normocytic anemia 01/26/2017   PAD (peripheral artery disease) (HCC) 12/14/2010   in the left vertebral, bilateral carotids     No Known Allergies   Current Outpatient Medications  Medication Sig Dispense Refill   albuterol (VENTOLIN HFA) 108 (90 Base) MCG/ACT inhaler Inhale 2 puffs into the lungs every 6 (six) hours as needed for wheezing or shortness of breath. 18 g 5   aspirin 81 MG chewable tablet Chew 81 mg by mouth daily.     atorvastatin (LIPITOR) 80 MG tablet TAKE 1 TABLET BY MOUTH EVERY DAY 90 tablet 3   budesonide-formoterol (SYMBICORT) 160-4.5 MCG/ACT inhaler Inhale 2 puffs into the lungs  2 (two) times daily. 1 each 3   cholecalciferol (VITAMIN D) 1000 units tablet Take 2 Units by mouth daily.      clopidogrel (PLAVIX) 75 MG tablet TAKE 1 TABLET BY MOUTH EVERY DAY 90 tablet 1   co-enzyme Q-10 30 MG capsule Take 100 mg by mouth daily.     ezetimibe (ZETIA) 10 MG tablet Take 1 tablet (10 mg total) by mouth daily. 90 tablet 3   lisinopril (ZESTRIL) 2.5 MG tablet TAKE 1 TABLET BY MOUTH EVERY DAY 90 tablet 3   dextromethorphan (DELSYM) 30 MG/5ML liquid Take 5 mLs (30 mg total) by mouth 2 (two) times daily as needed for cough. 89 mL 0   ferrous sulfate 325 (65 FE) MG tablet Take 325 mg by mouth daily with breakfast.     nitroGLYCERIN (NITROSTAT) 0.4 MG SL tablet Place 1 tablet (0.4 mg total) under the tongue every 5 (five) minutes as needed for chest pain. 25 tablet 3   vitamin B-12 (CYANOCOBALAMIN) 500 MCG tablet Take 500 mcg by mouth daily.     No current facility-administered medications for this visit.     Past Surgical History:  Procedure  Laterality Date   CARDIAC CATHETERIZATION  02/04/2005   A cypher 5.3 x 18 mm at 16 atmosphere of pressure in the mid LAD, this stent covered the proximal part of the LAD and also the mid LAD, this was then post dilated with 3.25 x 15 mm Quantum at 16 atmosphereof pressure for 45 seconds   CATARACT EXTRACTION Bilateral    CHOLECYSTECTOMY N/A 08/16/2017   Procedure: LAPAROSCOPIC CHOLECYSTECTOMY;  Surgeon: Franky Macho, MD;  Location: [facility];  Service: General;  Laterality: N/A;   COLONOSCOPY N/A 03/01/2017   Procedure: COLONOSCOPY;  Surgeon: West Bali, MD;  Location: [facility];  Service: Endoscopy;  Laterality: N/A;  1:00pm   ESOPHAGOGASTRODUODENOSCOPY N/A 01/28/2017   Procedure: ESOPHAGOGASTRODUODENOSCOPY (EGD);  Surgeon: Beverley Fiedler, MD;  Location: [facility] END[facility]COPY;  Service: Gastroenterology;  Laterality: N/A;   GIVENS CAPSULE STUDY N/A 03/12/2017   Procedure: GIVENS CAPSULE STUDY;  Surgeon: West Bali, MD;  Location: [facility];  Service: Endoscopy;  Laterality: N/A;  7:30am   TUBAL LIGATION       No Known Allergies    Family History  Problem Relation Age of Onset   Heart attack Mother    Hyperlipidemia Mother    Hypertension Mother    Diabetes Mother    Heart attack Father    Early death Father 47   Hyperlipidemia Father    Hypertension Father    Heart disease Sister    Hyperlipidemia Sister    Hypertension Sister    Diabetes Brother    Heart disease Sister    Hyperlipidemia Sister    Hypertension Sister    Heart attack Sister    Heart disease Sister    Hyperlipidemia Sister    Hypertension Sister    Heart attack Brother    Early death Brother 56   Hyperlipidemia Brother    Hypertension Brother    Heart attack Brother    Early death Brother 71   Hyperlipidemia Brother    Hypertension Brother    Cancer Maternal Aunt    Colon cancer Neg Hx    Colon polyps Neg Hx      Social History Ms. Faith reports that she has been smoking cigarettes. She  started smoking about 56 years ago. She has a 56.2 pack-year smoking history. She has never used smokeless  tobacco. Ms. Cavness reports no history of alcohol use.    Physical Examination Vitals:   05/09/23 1340 05/09/23 1356  BP: (!) 82/54 (!) 110/55  Pulse:    SpO2:     Filed Weights   05/09/23 1334  Weight: 125 lb (56.7 kg)    Gen: resting comfortably, no acute distress HEENT: no scleral icterus, pupils equal round and reactive, no palptable cervical adenopathy,  CV: RRR, no mrg, no jvd Resp: Clear to auscultation bilaterally GI: abdomen is soft, non-tender, non-distended, normal bowel sounds, no hepatosplenomegaly MSK: extremities are warm, no edema.  Skin: warm, no rash Neuro:  no focal deficits Psych: appropriate affect   Diagnostic Studies  05/2019 carotid US IMPRESSION: Left:   Color duplex indicates moderate heterogeneous and calcified plaque, with no hemodynamically significant stenosis by duplex criteria in the extracranial cerebrovascular circulation.   Right:   Note that established duplex criteria have not been validated in the setting of prior endarterectomy, however, there is no evidence of recurrent high-grade stenosis based on the duplex.     06/2020 AAA US IMPRESSION: Probable slight increase in AP diameter of the distal abdominal aortic aneurysm from 3.2 cm in 2022 3.4 cm currently. Recommend follow-up ultrasound every 3 years. This recommendation follows ACR consensus guidelines: White Paper of the ACR Incidental Findings Committee II on Vascular Findings. J Am Coll Radiol 2013; 10:789-794.       Assessment and Plan   1. CAD -  Given her history of complex RCA PCI as well as carotid and PAD would favor continuing DAPT - denies any recent symptoms, continue current meds     2. Hyperlipidemia - LDL above goal, stop atorvastatin 80mg  and start crestor 40mg  daily.    3. Carotid stenosis - mild disease by recent carotid US - continue  to monitor, would repeat US in 2026   4. AAA - has been mild, due for repeat imaging. Order AAA [place]  F/u 6months    Antoine Poche, M.D.

## 2023-05-15 ENCOUNTER — Ambulatory Visit (HOSPITAL_COMMUNITY)
Admission: RE | Admit: 2023-05-15 | Discharge: 2023-05-15 | Disposition: A | Discharge status: Home / Self Care | Source: Ambulatory Visit | Attending: Cardiology | Admitting: Cardiology

## 2023-05-15 DIAGNOSIS — I714 Abdominal aortic aneurysm, without rupture, unspecified: Secondary | ICD-10-CM | POA: Diagnosis not present

## 2023-05-15 DIAGNOSIS — I7143 Infrarenal abdominal aortic aneurysm, without rupture: Secondary | ICD-10-CM | POA: Diagnosis not present

## 2023-05-17 IMAGING — CT CT CHEST LUNG CANCER SCREENING LOW DOSE W/O CM
1 series · 10 of 10 positions shown, 13 images · non-contrast
Comparison: Low-dose lung cancer screening chest CT 05/21/2019.

CLINICAL DATA: 70-year-old female current smoker with 51 pack-year
history of smoking. Lung cancer screening examination.



[ct lung segmentation data · axial · 0.72mm/px · z∈[+1187,+1187]mm · 10 of 309 frames shown]
[frame 1/309  mediastinal]
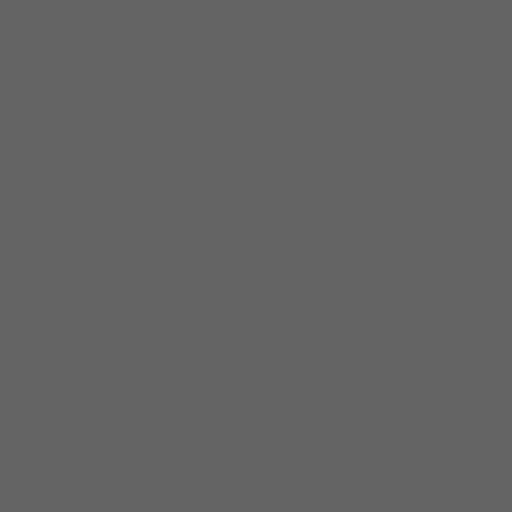
[frame 1/309  lung]
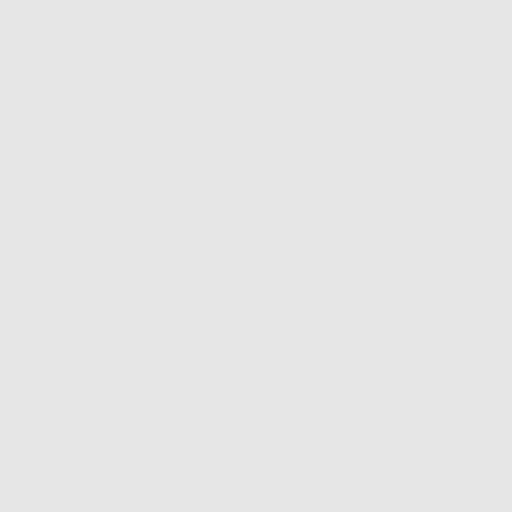
[frame 35/309  lung]
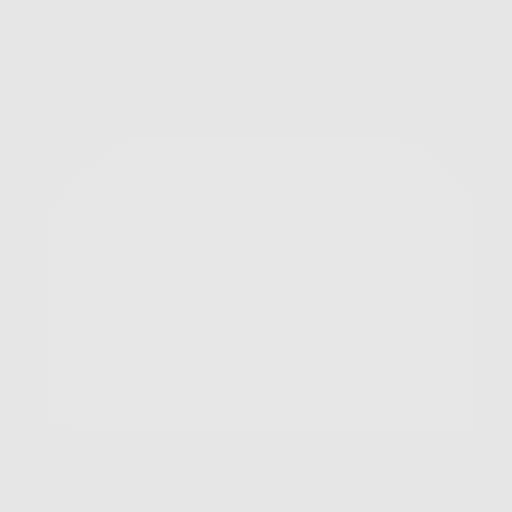
[frame 69/309  lung]
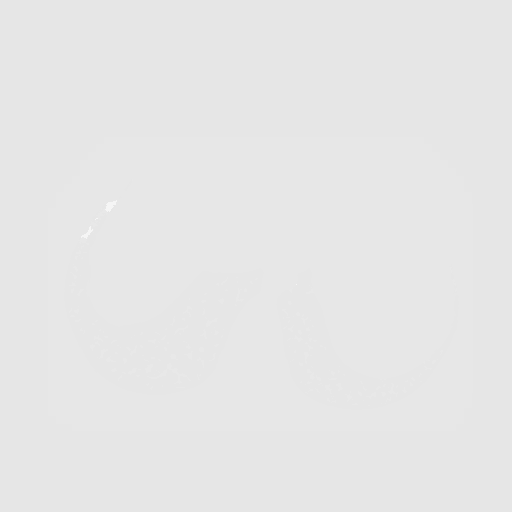
[frame 103/309  lung]
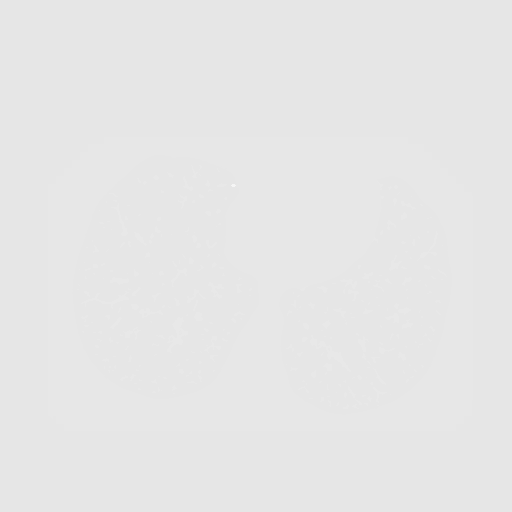
[frame 137/309  mediastinal]
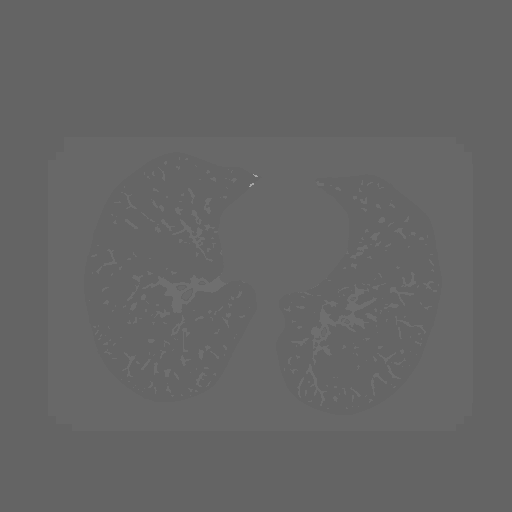
[frame 137/309  lung]
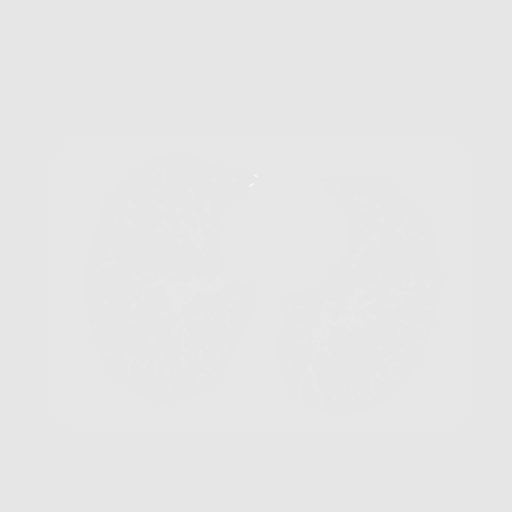
[frame 172/309  lung]
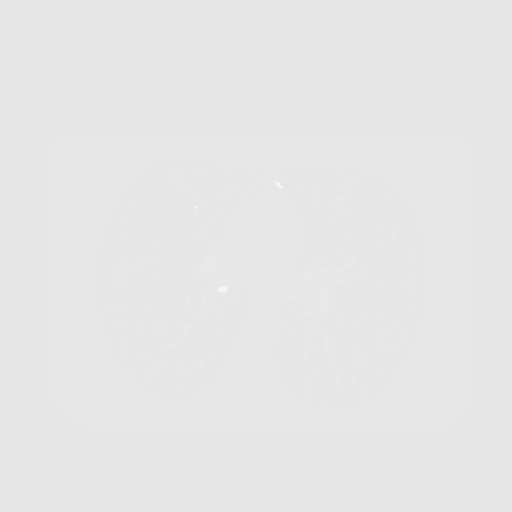
[frame 206/309  lung]
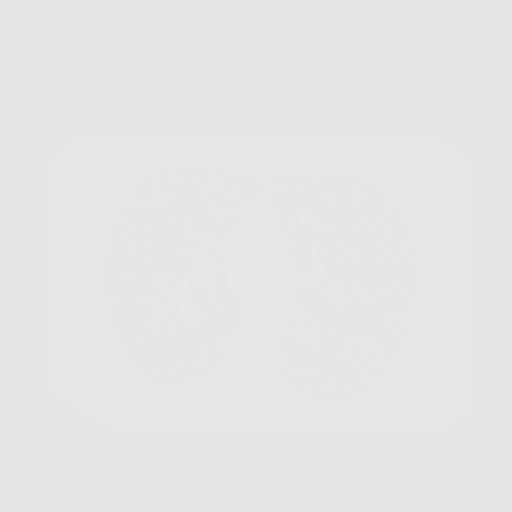
[frame 240/309  lung]
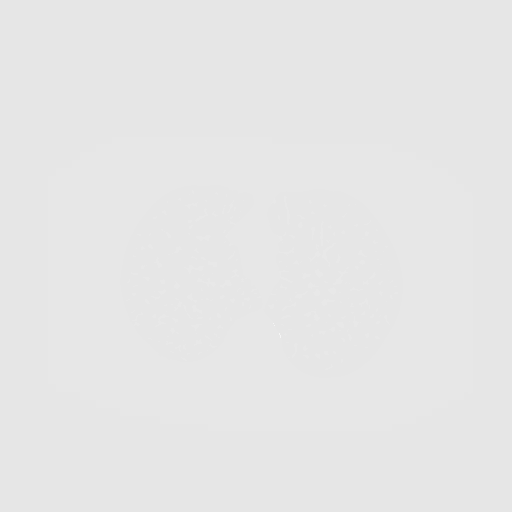
[frame 274/309  mediastinal]
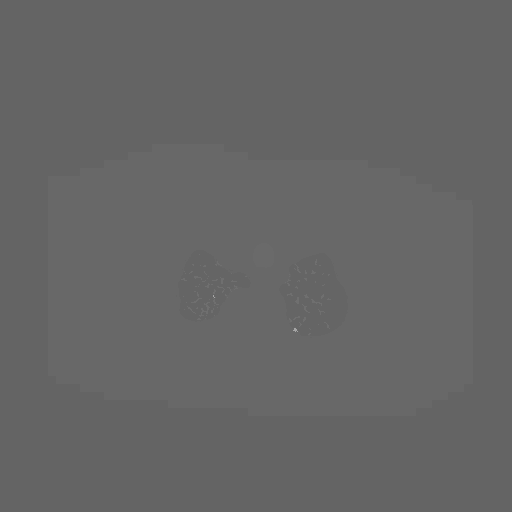
[frame 274/309  lung]
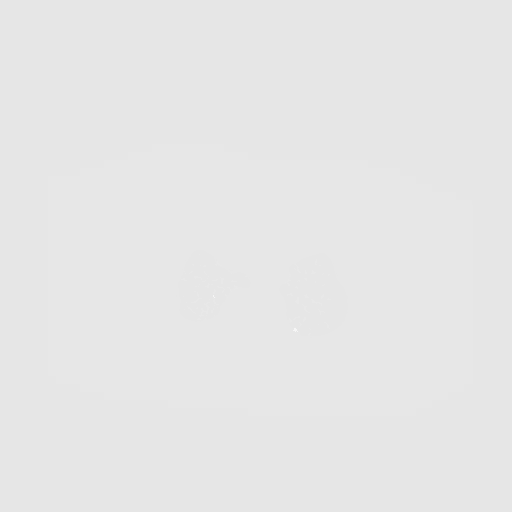
[frame 309/309  lung]
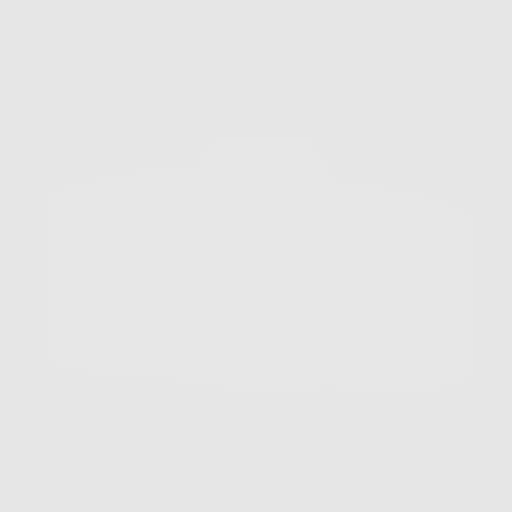

[10 of 10 positions shown; findings below may reference images not displayed]

FINDINGS: Cardiovascular: Heart size is normal. There is no significant
pericardial fluid, thickening or pericardial calcification. There is
aortic atherosclerosis, as well as atherosclerosis of the great
vessels of the mediastinum and the coronary arteries, including
calcified atherosclerotic plaque in the left main, left anterior
descending, left circumflex and right coronary arteries.

Mediastinum/Nodes: No pathologically enlarged mediastinal or hilar
lymph nodes. Please note that accurate exclusion of hilar adenopathy
is limited on noncontrast CT scans. Esophagus is unremarkable in
appearance. No axillary lymphadenopathy.

Lungs/Pleura: Tiny pulmonary nodules are again noted in the lungs,
largest of which is in the posterior aspect of the right upper lobe
(axial image 58 of series 3), with a volume derived mean diameter of
3.5 mm. No other larger more suspicious appearing pulmonary nodules
or masses are noted. No acute consolidative airspace disease. No
pleural effusions. Mild diffuse bronchial wall thickening with mild
centrilobular and paraseptal emphysema.

Upper Abdomen: Aortic atherosclerosis.  Status post cholecystectomy.

Musculoskeletal: There are no aggressive appearing lytic or blastic
lesions noted in the visualized portions of the skeleton.
IMPRESSION: 1. Lung-RADS 2S, benign appearance or behavior. Continue annual
screening with low-dose chest CT without contrast in 12 months.
2. The "S" modifier above refers to potentially clinically
significant non lung cancer related findings. Specifically, there is
aortic atherosclerosis, in addition to left main and three-vessel
coronary artery disease. Please note that although the presence of
coronary artery calcium documents the presence of coronary artery
disease, the severity of this disease and any potential stenosis
cannot be assessed on this non-gated CT examination. Assessment for
potential risk factor modification, dietary therapy or pharmacologic
therapy may be warranted, if clinically indicated.
3. Mild diffuse bronchial wall thickening with mild centrilobular
and paraseptal emphysema; imaging findings suggestive of underlying
COPD.

Aortic Atherosclerosis (D5JVF-TT6.6) and Emphysema (D5JVF-RPR.5).

## 2023-05-17 IMAGING — US US RENAL
1 series · 14 of 25 positions shown · non-contrast
Comparison: 06/13/2017

CLINICAL DATA: Hypertension, stage III chronic renal insufficiency,
proteinuria

EXAM:
RENAL / URINARY TRACT ULTRASOUND COMPLETE

[Series 1: us renal · 0.23mm/px · 14 of 66 slices shown]
[im 1/66]
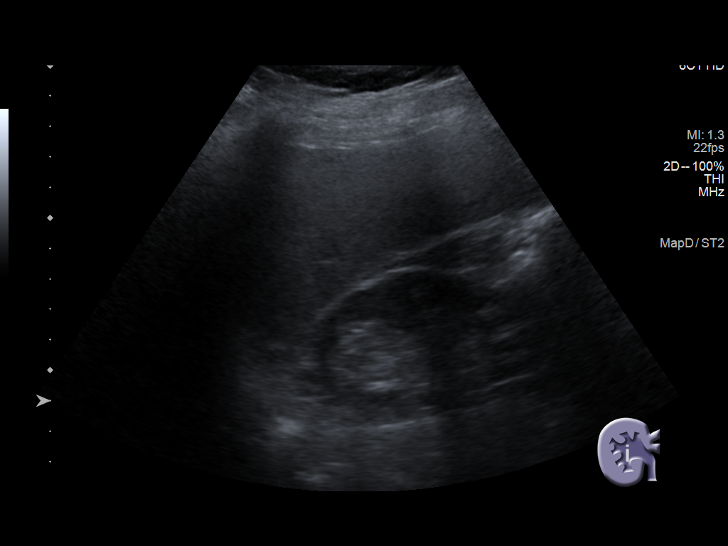
[im 6/66]
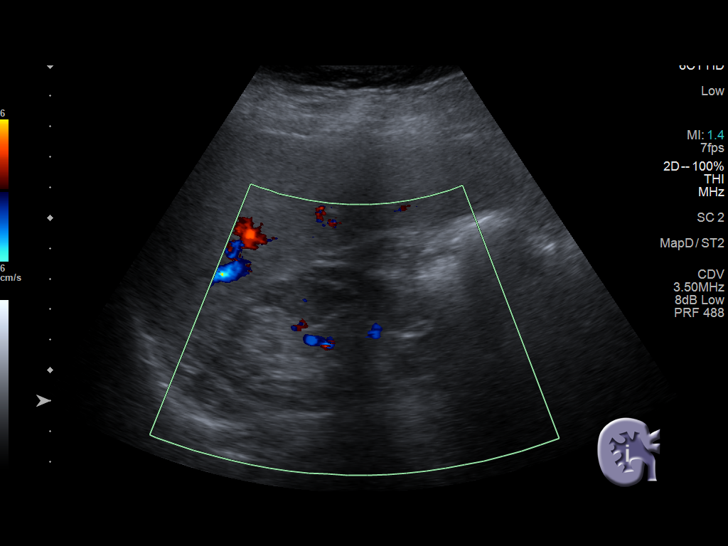
[im 11/66]
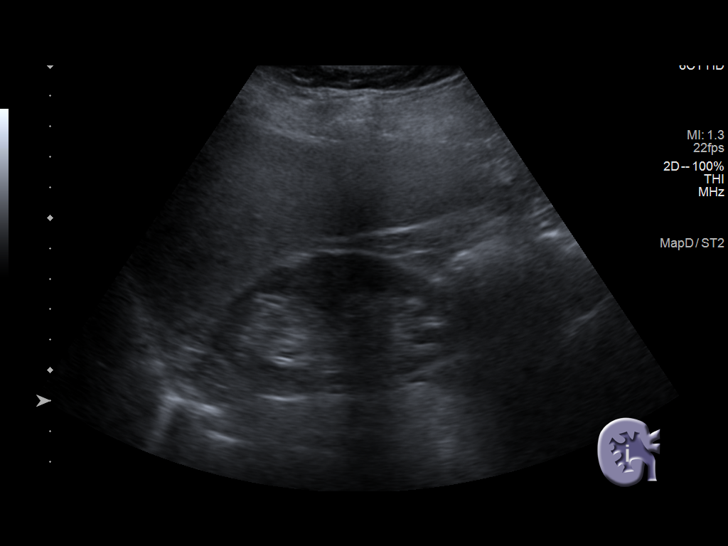
[im 17/66]
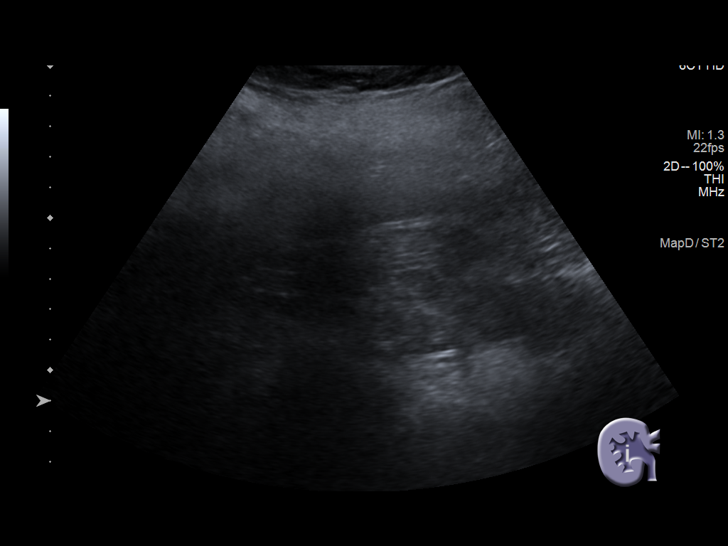
[im 22/66]
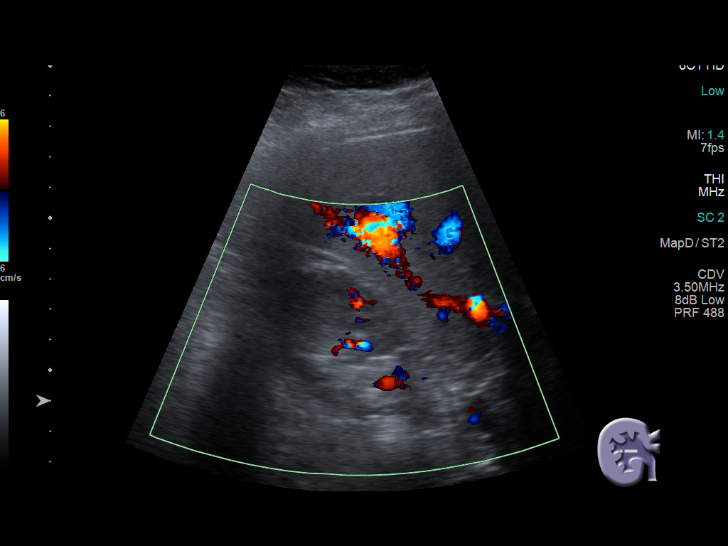
[im 25/66]
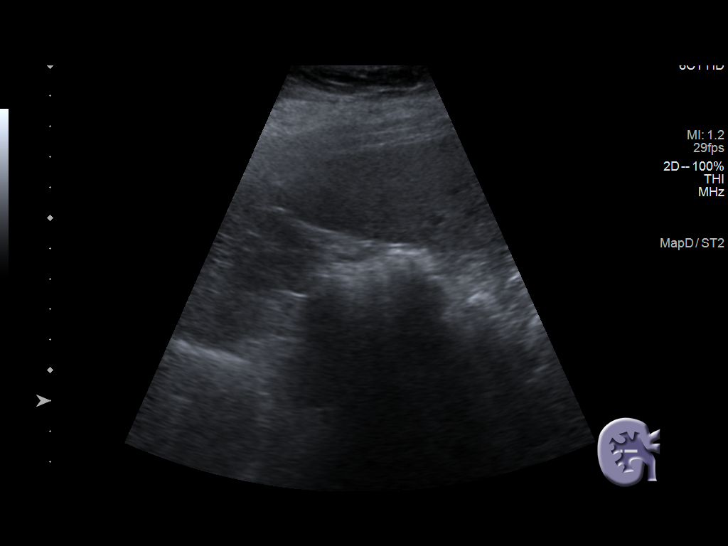
[im 30/66]
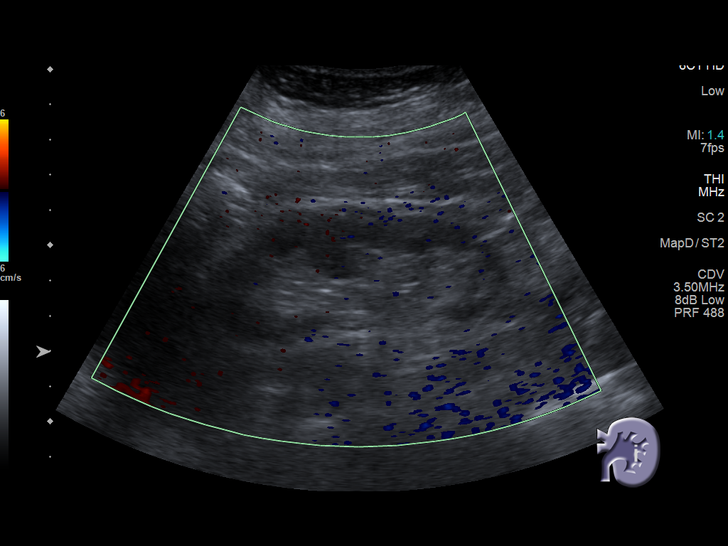
[im 36/66]
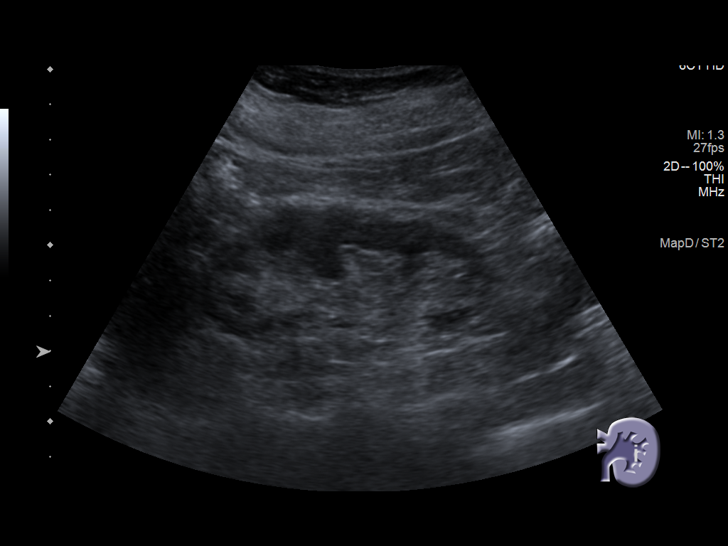
[im 41/66]
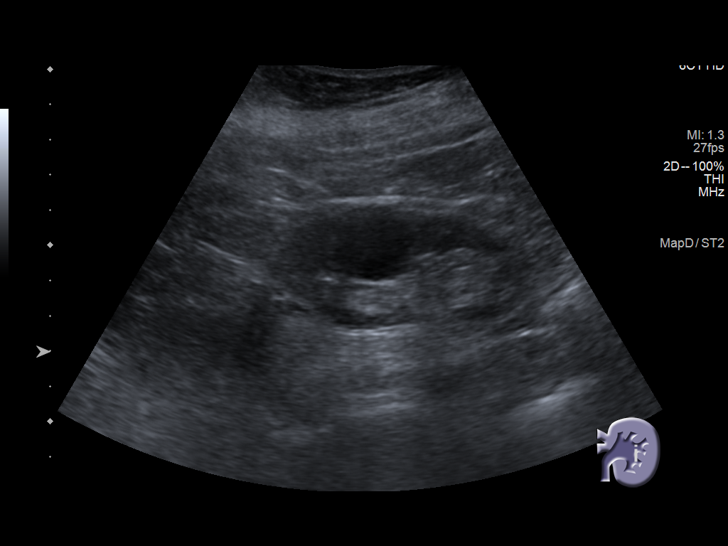
[im 44/66]
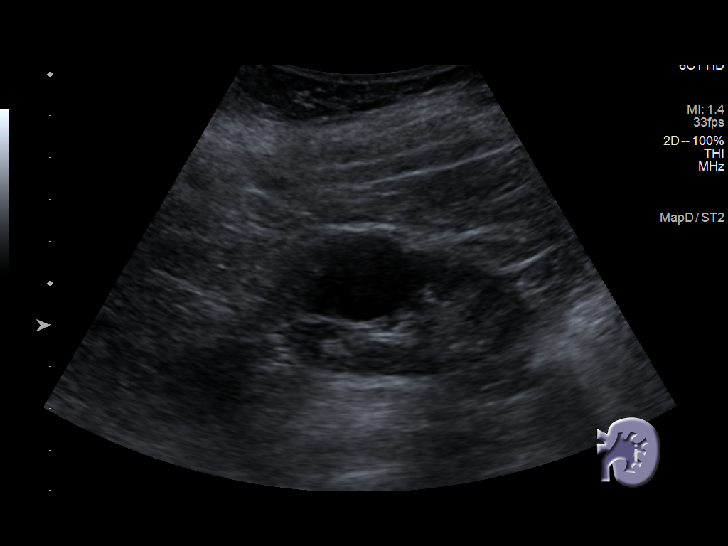
[im 49/66]
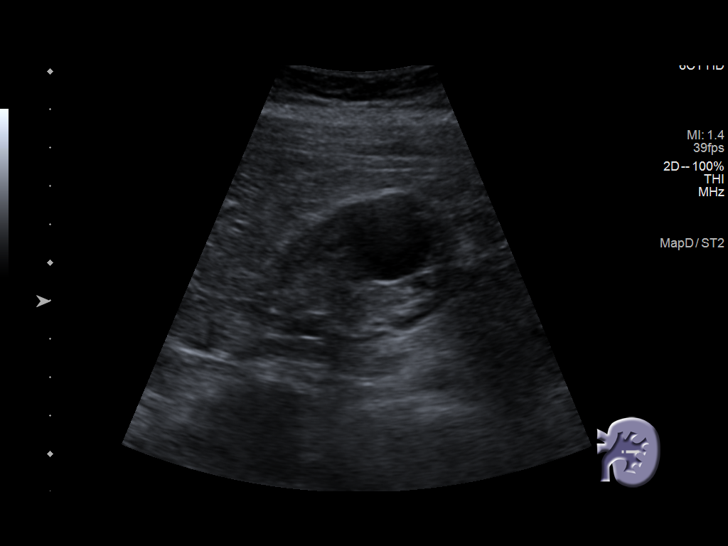
[im 55/66]
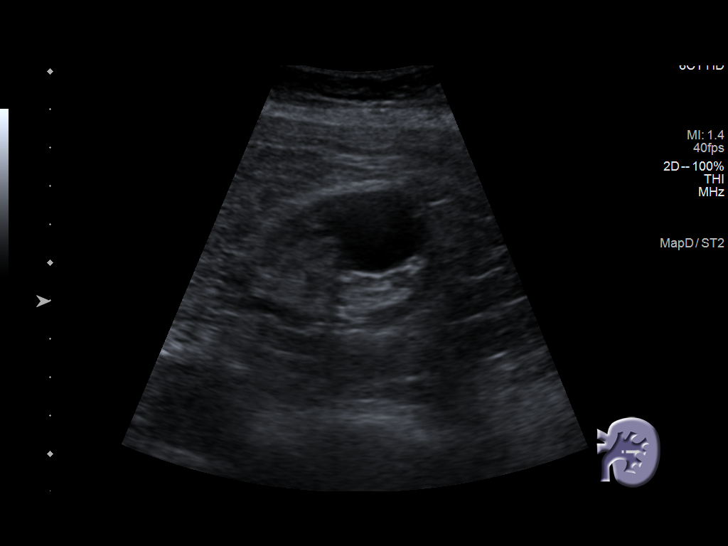
[im 60/66]
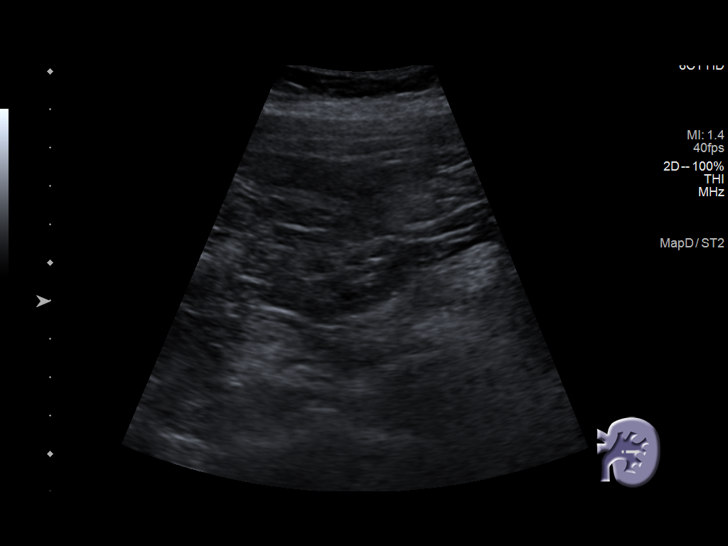
[im 66/66]
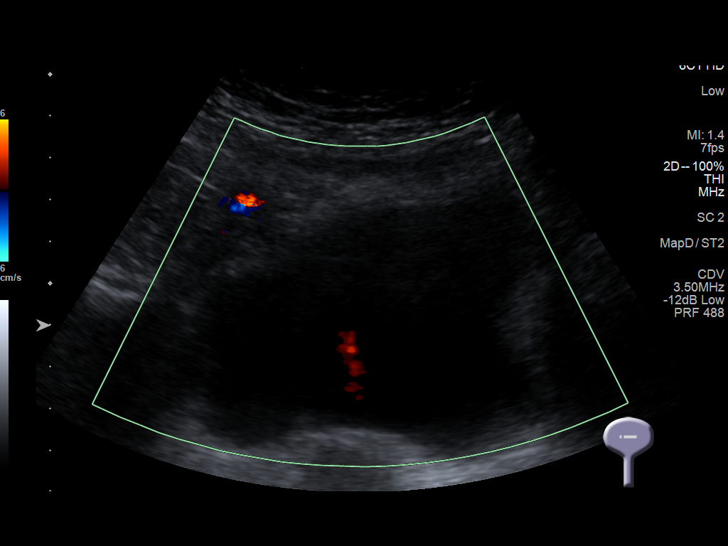

[14 of 25 positions shown; findings below may reference images not displayed]

FINDINGS: Right Kidney:

Renal measurements: 8.7 x 4.7 by 4.4 cm = volume: 96 mL. Mild renal
cortical thinning. Normal echotexture. No hydronephrosis or renal
mass.

Left Kidney:

Renal measurements: 8.7 x 4.1 by 4.2 cm = volume: 78 mL. Mild renal
cortical thinning. Normal renal echotexture. Benign simple cyst
upper lateral aspect left kidney measuring 2.4 x 2.2 x 2.4 cm. No
hydronephrosis.

Bladder:

Appears normal for degree of bladder distention.

Other:

None.
IMPRESSION: 1. Mild bilateral renal cortical thinning.  Normal echotexture.
2. Simple benign left renal cyst measuring 2.4 cm.

## 2023-05-18 ENCOUNTER — Other Ambulatory Visit: Payer: Self-pay | Admitting: Cardiology

## 2023-05-30 ENCOUNTER — Other Ambulatory Visit: Payer: Self-pay | Admitting: Internal Medicine

## 2023-05-30 DIAGNOSIS — J441 Chronic obstructive pulmonary disease with (acute) exacerbation: Secondary | ICD-10-CM

## 2023-06-12 ENCOUNTER — Inpatient Hospital Stay: Payer: Medicare Other | Attending: Hematology

## 2023-06-12 DIAGNOSIS — D508 Other iron deficiency anemias: Secondary | ICD-10-CM

## 2023-06-12 DIAGNOSIS — I129 Hypertensive chronic kidney disease with stage 1 through stage 4 chronic kidney disease, or unspecified chronic kidney disease: Secondary | ICD-10-CM | POA: Diagnosis present

## 2023-06-12 DIAGNOSIS — Z79899 Other long term (current) drug therapy: Secondary | ICD-10-CM | POA: Diagnosis not present

## 2023-06-12 DIAGNOSIS — D631 Anemia in chronic kidney disease: Secondary | ICD-10-CM | POA: Insufficient documentation

## 2023-06-12 DIAGNOSIS — N1832 Chronic kidney disease, stage 3b: Secondary | ICD-10-CM | POA: Diagnosis not present

## 2023-06-12 LAB — CBC WITH DIFFERENTIAL/PLATELET
Abs Immature Granulocytes: 0.01 10*3/uL (ref 0.00–0.07)
Basophils Absolute: 0.1 10*3/uL (ref 0.0–0.1)
Basophils Relative: 1 %
Eosinophils Absolute: 0.2 10*3/uL (ref 0.0–0.5)
Eosinophils Relative: 4 %
HCT: 40 % (ref 36.0–46.0)
Hemoglobin: 12.8 g/dL (ref 12.0–15.0)
Immature Granulocytes: 0 %
Lymphocytes Relative: 29 %
Lymphs Abs: 1.6 10*3/uL (ref 0.7–4.0)
MCH: 31.5 pg (ref 26.0–34.0)
MCHC: 32 g/dL (ref 30.0–36.0)
MCV: 98.5 fL (ref 80.0–100.0)
Monocytes Absolute: 0.5 10*3/uL (ref 0.1–1.0)
Monocytes Relative: 9 %
Neutro Abs: 3.2 10*3/uL (ref 1.7–7.7)
Neutrophils Relative %: 57 %
Platelets: 169 10*3/uL (ref 150–400)
RBC: 4.06 MIL/uL (ref 3.87–5.11)
RDW: 12.1 % (ref 11.5–15.5)
WBC: 5.6 10*3/uL (ref 4.0–10.5)
nRBC: 0 % (ref 0.0–0.2)

## 2023-06-12 LAB — COMPREHENSIVE METABOLIC PANEL WITH GFR
ALT: 16 U/L (ref 0–44)
AST: 22 U/L (ref 15–41)
Albumin: 3.8 g/dL (ref 3.5–5.0)
Alkaline Phosphatase: 76 U/L (ref 38–126)
Anion gap: 10 (ref 5–15)
BUN: 17 mg/dL (ref 8–23)
CO2: 25 mmol/L (ref 22–32)
Calcium: 9.1 mg/dL (ref 8.9–10.3)
Chloride: 108 mmol/L (ref 98–111)
Creatinine, Ser: 2.16 mg/dL — ABNORMAL HIGH (ref 0.44–1.00)
GFR, Estimated: 24 mL/min — ABNORMAL LOW (ref >60)
Glucose, Bld: 88 mg/dL (ref 70–99)
Potassium: 4.1 mmol/L (ref 3.5–5.1)
Sodium: 143 mmol/L (ref 135–145)
Total Bilirubin: 0.5 mg/dL (ref 0.0–1.2)
Total Protein: 6.8 g/dL (ref 6.5–8.1)

## 2023-06-12 LAB — IRON AND TIBC
Iron: 67 ug/dL (ref 28–170)
Saturation Ratios: 20 % (ref 10.4–31.8)
TIBC: 333 ug/dL (ref 250–450)
UIBC: 266 ug/dL

## 2023-06-12 LAB — FERRITIN: Ferritin: 108 ng/mL (ref 11–307)

## 2023-06-18 NOTE — Progress Notes (Addendum)
 [facility] [address] [place], [place] [address]   CLINIC:  Medical Oncology/Hematology  PCP:  Meldon Sport, MD [address] [place] [address] [phone]  REASON FOR VISIT:  Follow-up for normocytic/macrocytic anemia secondary to iron deficiency and CKD   CURRENT THERAPY: Oral iron supplementation with IV iron PRN  INTERVAL HISTORY:  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  She was last evaluated via telemedicine visit by Sheril Dines PA-C on 12/19/2022.  (She received IV Monoferric  on 06/22/2022.)   She reports moderate energy with some mild fatigue and tires easily. She has been taking ferrous sulfate once daily, in addition to vitamin B12.  She has not had any melanotic ("soft and black) bowel movements for over 6 months.  No hematochezia or hematemesis.  She has some headaches and muscle cramps.  She denies any recent chest pain, lightheadedness, or syncope. Baseline SOB and cough with underlying COPD.  She has 75% energy and 100% appetite. She endorses that she is maintaining a stable weight.  ASSESSMENT & PLAN:  1.  Normocytic/macrocytic anemia  - Previously seen by hematology for normocytic anemia secondary to CKD, iron deficiency, and B12 deficiency.  - EGD (01/28/2017): Gastritis, normal duodenum - Colonoscopy (03/01/2017): Redundant colon, large rectal polyp removed, external hemorrhoids - Stool cards have not been checked at our office, patient reports that one of her other providers (Dr. Nolene Baumgarten) checked them and they were negative. - She denies any gross hematochezia or melena - She takes ferrous sulfate and vitamin B12   -- Most recent IV iron (Monoferric  x 1) in May 2024 - No clear explanation of macrocytosis - No known liver disease (liver US  in 2018 was normal); folate, TSH, LDH, B12, methylmalonic acid, copper  normal.  No reticulocytosis. - Most  recent labs (06/12/2023): Hgb 12.8/MCV 98.5, overall normal CBC/differential.  Ferritin 108, iron saturation 20%.  Creatinine slightly elevated above baseline at 2.16/GFR 24 (she has an appointment with Dr. Carrolyn Clan next week) - Differential diagnosis favors anemia related to CKD stage IIIb/IV; cause of mild macrocytosis unclear, although macrocytosis has currently resolved - PLAN: No indication for IV iron at this time. - Continue ferrous sulfate and vitamin B12 daily.   - Repeat labs (CBC/D, ferritin, iron/TIBC, B12, MMA) and RTC in 1 year   2.  Elevated serum free light chains - MGUS panel (08/30/2020): SPEP and immunofixation normal, elevated light chains with kappa 66.9/lambda 32.3/ratio 2.15 - Repeat MGUS panel (06/09/2021): SPEP and immunofixation normal.  Elevated kappa light chain 69.8, elevated lambda light chain 30.4.  Free light chain ratio is slightly elevated at 2.30, but this would be considered within normal limits for patient with CKD.  LDH normal. - Elevated light chains likely secondary to CKD - PLAN: No further work-up at this time.   3.  CKD stage IIIb/IV - Follows with Dr. Carrolyn Clan   4.  Tobacco abuse - This patient meets criteria for low-dose CT lung cancer screening.  She has smoked 1 to 2 packs/day since age 73, currently smoking 1 pack/day but trying to quit. - LDCT chest (02/22/2022): Lung RADS category 2, benign appearance or behavior of lung nodules (incidental findings include 3.0 cm infrarenal abdominal aortic aneurysm, aortic atherosclerosis, emphysema, and coronary artery atherosclerosis) - LDCT chest (02/13/2023): Lung RADS 2S, benign.  (Other findings include aortic atherosclerosis, coronary artery disease, emphysema, and aortic valve calcifications) - PLAN: Continue annual LDCT  screening (next due January 2026).  Follow-up with PCP regarding other findings. - She declines referral to smoking cessation counselor at this time, but has been provided with information.     PLAN SUMMARY: >> LDCT chest due January 2026 >> Labs in 1 year = CBC/D, B12, MMA, ferritin, iron/TIBC >> OFFICE visit in 1 year (1 week after labs)  ** Last office visit 06/19/23     REVIEW OF SYSTEMS:   Review of Systems  Constitutional:  Positive for fatigue. Negative for appetite change, chills, diaphoresis, fever and unexpected weight change.  HENT:   Negative for lump/mass and nosebleeds.   Eyes:  Negative for eye problems.  Respiratory:  Positive for cough. Negative for hemoptysis and shortness of breath.   Cardiovascular:  Negative for chest pain, leg swelling and palpitations.  Gastrointestinal:  Negative for abdominal pain, blood in stool, constipation, diarrhea, nausea and vomiting.  Genitourinary:  Negative for hematuria.   Skin: Negative.   Neurological:  Positive for headaches. Negative for dizziness and light-headedness.  Hematological:  Does not bruise/bleed easily.     PHYSICAL EXAM:  ECOG PERFORMANCE STATUS: 1 - Symptomatic but completely ambulatory  Vitals:   06/19/23 0952  BP: 108/62  Pulse: 86  Resp: 16  Temp: 98.2 F (36.8 C)  SpO2: 97%   Filed Weights   06/19/23 0952  Weight: 126 lb 12.2 oz (57.5 kg)   Physical Exam Constitutional:      Appearance: Normal appearance. She is normal weight.  Cardiovascular:     Heart sounds: Normal heart sounds.  Pulmonary:     Breath sounds: Decreased breath sounds present.  Neurological:     General: No focal deficit present.     Mental Status: Mental status is at baseline.  Psychiatric:        Behavior: Behavior normal. Behavior is cooperative.     PAST MEDICAL/SURGICAL HISTORY:  Past Medical History:  Diagnosis Date   AAA (abdominal aortic aneurysm) (HCC)    Anemia    Arthritis    Asthma    CAD (coronary artery disease)    stents   Calculus of gallbladder without cholecystitis without obstruction    Carotid artery disease (HCC)    Chronic kidney disease, stage 3a (HCC)    COPD (chronic  obstructive pulmonary disease) (HCC)    Emphysema of lung (HCC)    GERD (gastroesophageal reflux disease)    Hyperlipidemia    Hypertension    Iliac artery stenosis, right (HCC)    60%-70%   Myocardial infarction (HCC)    Normocytic anemia 01/26/2017   PAD (peripheral artery disease) (HCC) 12/14/2010   in the left vertebral, bilateral carotids   Past Surgical History:  Procedure Laterality Date   CARDIAC CATHETERIZATION  02/04/2005   A cypher 5.3 x 18 mm at 16 atmosphere of pressure in the mid LAD, this stent covered the proximal part of the LAD and also the mid LAD, this was then post dilated with 3.25 x 15 mm Quantum at 16 atmosphereof pressure for 45 seconds   CATARACT EXTRACTION Bilateral    CHOLECYSTECTOMY N/A 08/16/2017   Procedure: LAPAROSCOPIC CHOLECYSTECTOMY;  Surgeon: Alanda Allegra, MD;  Location: [facility];  Service: General;  Laterality: N/A;   COLONOSCOPY N/A 03/01/2017   Procedure: COLONOSCOPY;  Surgeon: Alyce Jubilee, MD;  Location: [facility];  Service: Endoscopy;  Laterality: N/A;  1:00pm   ESOPHAGOGASTRODUODENOSCOPY N/A 01/28/2017   Procedure: ESOPHAGOGASTRODUODENOSCOPY (EGD);  Surgeon: Nannette Babe, MD;  Location: [facility];  Service: Gastroenterology;  Laterality: N/A;   GIVENS CAPSULE STUDY N/A 03/12/2017   Procedure: GIVENS CAPSULE STUDY;  Surgeon: Alyce Jubilee, MD;  Location: [facility];  Service: Endoscopy;  Laterality: N/A;  7:30am   TUBAL LIGATION      SOCIAL HISTORY:  Social History   Socioeconomic History   Marital status: Married    Spouse name: Andy Bannister   Number of children: 3   Years of education: 14   Highest education level: Not on file  Occupational History   Occupation: RETIRED    Comment: warehouse/textiles  Tobacco Use   Smoking status: Every Day    Current packs/day: 1.00    Average packs/day: 1 pack/day for 56.4 years (56.4 ttl pk-yrs)    Types: Cigarettes    Start date: 02/09/1967   Smokeless tobacco: Never  Vaping Use    Vaping status: Never Used  Substance and Sexual Activity   Alcohol use: No   Drug use: No   Sexual activity: Yes    Birth control/protection: Post-menopausal  Other Topics Concern   Not on file  Social History Narrative   Retired   Has an AA in [employer] with husband Andy Bannister for 23 years   Stays busy with home, gardens, likes to can   Social Drivers of Corporate investment banker Strain: Low Risk  (07/30/2022)   Overall Financial Resource Strain (CARDIA)    Difficulty of Paying Living Expenses: Not hard at all  Food Insecurity: No Food Insecurity (02/02/2023)   Hunger Vital Sign    Worried About Running Out of Food in the Last Year: Never true    Ran Out of Food in the Last Year: Never true  Transportation Needs: No Transportation Needs (02/02/2023)   PRAPARE - Administrator, Civil Service (Medical): No    Lack of Transportation (Non-Medical): No  Physical Activity: Sufficiently Active (07/30/2022)   Exercise Vital Sign    Days of Exercise per Week: 7 days    Minutes of Exercise per Session: 40 min  Stress: No Stress Concern Present (07/30/2022)   [employer] of Occupational Health - Occupational Stress Questionnaire    Feeling of Stress : Not at all  Social Connections: Moderately Isolated (02/03/2023)   Social Connection and Isolation Panel [NHANES]    Frequency of Communication with Friends and Family: More than three times a week    Frequency of Social Gatherings with Friends and Family: More than three times a week    Attends Religious Services: More than 4 times per year    Active Member of [employer] or Organizations: No    Attends Banker Meetings: Never    Marital Status: Divorced  Catering manager Violence: Not At Risk (02/02/2023)   Humiliation, Afraid, Rape, and Kick questionnaire    Fear of Current or Ex-Partner: No    Emotionally Abused: No    Physically Abused: No    Sexually Abused: No    FAMILY HISTORY:  Family History  Problem  Relation Age of Onset   Heart attack Mother    Hyperlipidemia Mother    Hypertension Mother    Diabetes Mother    Heart attack Father    Early death Father 11   Hyperlipidemia Father    Hypertension Father    Heart disease Sister    Hyperlipidemia Sister    Hypertension Sister    Diabetes Brother    Heart disease Sister    Hyperlipidemia Sister  Hypertension Sister    Heart attack Sister    Heart disease Sister    Hyperlipidemia Sister    Hypertension Sister    Heart attack Brother    Early death Brother 66   Hyperlipidemia Brother    Hypertension Brother    Heart attack Brother    Early death Brother 59   Hyperlipidemia Brother    Hypertension Brother    Cancer Maternal Aunt    Colon cancer Neg Hx    Colon polyps Neg Hx     CURRENT MEDICATIONS:  Outpatient Encounter Medications as of 06/19/2023  Medication Sig   albuterol  (VENTOLIN  HFA) 108 (90 Base) MCG/ACT inhaler Inhale 2 puffs into the lungs every 6 (six) hours as needed for wheezing or shortness of breath.   aspirin  81 MG chewable tablet Chew 81 mg by mouth daily.   budesonide -formoterol  (SYMBICORT ) 160-4.5 MCG/ACT inhaler INHALE 2 PUFFS INTO THE LUNGS TWICE A DAY   cholecalciferol (VITAMIN D ) 1000 units tablet Take 2 Units by mouth daily.    clopidogrel  (PLAVIX ) 75 MG tablet TAKE 1 TABLET BY MOUTH EVERY DAY   co-enzyme Q-10 30 MG capsule Take 100 mg by mouth daily.   dextromethorphan  (DELSYM ) 30 MG/5ML liquid Take 5 mLs (30 mg total) by mouth 2 (two) times daily as needed for cough.   ezetimibe  (ZETIA ) 10 MG tablet Take 1 tablet (10 mg total) by mouth daily.   ferrous sulfate 325 (65 FE) MG tablet Take 325 mg by mouth daily with breakfast.   lisinopril  (ZESTRIL ) 2.5 MG tablet TAKE 1 TABLET BY MOUTH EVERY DAY   nitroGLYCERIN  (NITROSTAT ) 0.4 MG SL tablet Place 1 tablet (0.4 mg total) under the tongue every 5 (five) minutes as needed for chest pain.   rosuvastatin  (CRESTOR ) 40 MG tablet Take 1 tablet (40 mg total)  by mouth daily.   vitamin B-12 (CYANOCOBALAMIN ) 500 MCG tablet Take 500 mcg by mouth daily.   No facility-administered encounter medications on file as of 06/19/2023.    ALLERGIES:  No Known Allergies  LABORATORY DATA:  I have reviewed the labs as listed.  CBC    Component Value Date/Time   WBC 5.6 06/12/2023 1045   RBC 4.06 06/12/2023 1045   HGB 12.8 06/12/2023 1045   HCT 40.0 06/12/2023 1045   PLT 169 06/12/2023 1045   MCV 98.5 06/12/2023 1045   MCH 31.5 06/12/2023 1045   MCHC 32.0 06/12/2023 1045   RDW 12.1 06/12/2023 1045   LYMPHSABS 1.6 06/12/2023 1045   MONOABS 0.5 06/12/2023 1045   EOSABS 0.2 06/12/2023 1045   BASOSABS 0.1 06/12/2023 1045      Latest Ref Rng & Units 06/12/2023   10:45 AM 02/03/2023    4:46 AM 02/02/2023    2:41 PM  CMP  Glucose 70 - 99 mg/dL 88  696  295   BUN 8 - 23 mg/dL 17  26  26    Creatinine 0.44 - 1.00 mg/dL 2.84  1.32  4.40   Sodium 135 - 145 mmol/L 143  139  138   Potassium 3.5 - 5.1 mmol/L 4.1  3.8  4.0   Chloride 98 - 111 mmol/L 108  108  105   CO2 22 - 32 mmol/L 25  21  23    Calcium  8.9 - 10.3 mg/dL 9.1  8.8  9.3   Total Protein 6.5 - 8.1 g/dL 6.8   7.7   Total Bilirubin 0.0 - 1.2 mg/dL 0.5   0.8   Alkaline Phos 38 -  126 U/L 76   108   AST 15 - 41 U/L 22   15   ALT 0 - 44 U/L 16   15     DIAGNOSTIC IMAGING:  I have independently reviewed the relevant imaging and discussed with the patient.   WRAP UP:  All questions were answered. The patient knows to call the clinic with any problems, questions or concerns.  Medical decision making: Moderate  Time spent on visit: I spent 20 minutes counseling the patient face to face. The total time spent in the appointment was 30 minutes and more than 50% was on counseling.  Sonnie Dusky, PA-C  06/19/23 10:27 AM

## 2023-06-19 ENCOUNTER — Inpatient Hospital Stay (HOSPITAL_BASED_OUTPATIENT_CLINIC_OR_DEPARTMENT_OTHER): Payer: Medicare Other | Admitting: Physician Assistant

## 2023-06-19 VITALS — BP 108/62 | HR 86 | Temp 98.2°F | Resp 16 | Wt 126.8 lb

## 2023-06-19 DIAGNOSIS — D631 Anemia in chronic kidney disease: Secondary | ICD-10-CM | POA: Diagnosis not present

## 2023-06-19 DIAGNOSIS — D508 Other iron deficiency anemias: Secondary | ICD-10-CM | POA: Diagnosis not present

## 2023-06-19 DIAGNOSIS — N189 Chronic kidney disease, unspecified: Secondary | ICD-10-CM | POA: Diagnosis not present

## 2023-06-19 DIAGNOSIS — Z79899 Other long term (current) drug therapy: Secondary | ICD-10-CM | POA: Diagnosis not present

## 2023-06-19 DIAGNOSIS — Z87891 Personal history of nicotine dependence: Secondary | ICD-10-CM | POA: Diagnosis not present

## 2023-06-19 DIAGNOSIS — N1832 Chronic kidney disease, stage 3b: Secondary | ICD-10-CM

## 2023-06-19 DIAGNOSIS — R809 Proteinuria, unspecified: Secondary | ICD-10-CM | POA: Diagnosis not present

## 2023-06-19 DIAGNOSIS — D539 Nutritional anemia, unspecified: Secondary | ICD-10-CM

## 2023-06-19 NOTE — Patient Instructions (Signed)
 [facility] Discharge Instructions  You were seen today by Sheril Dines PA-C for your history of anemia.  Your blood and iron levels look great!  OTHER TESTS: CT scan of your chest for lung cancer screening in January 2026  MEDICATIONS: Continue iron tablets and vitamin B12 supplement once daily  FOLLOW-UP APPOINTMENT: Office visit in 1 year (after labs)  ** Thank you for trusting me with your healthcare!  I strive to provide all of my patients with quality care at each visit.  If you receive a survey for this visit, I would be so grateful to you for taking the time to provide feedback.  Thank you in advance!  ~ Yaser Harvill                   Dr. Paulett Boros   &   Sheril Dines, PA-C   - - - - - - - - - - - - - - - - - -     Thank you for choosing [facility] at [facility] to provide your oncology and hematology care.  To afford each patient quality time with our provider, please arrive at least 15 minutes before your scheduled appointment time.   If you have a lab appointment with the Cancer Center please come in thru the Main Entrance and check in at the main information desk.  You need to re-schedule your appointment should you arrive 10 or more minutes late.  We strive to give you quality time with our providers, and arriving late affects you and other patients whose appointments are after yours.  Also, if you no show three or more times for appointments you may be dismissed from the clinic at the providers discretion.     Again, thank you for choosing [facility].  Our hope is that these requests will decrease the amount of time that you wait before being seen by our physicians.       _____________________________________________________________  Should you have questions after your visit to [facility], please contact our office at [phone] and follow the prompts.  Our office  hours are 8:00 a.m. and 4:30 p.m. Monday - Friday.  Please note that voicemails left after 4:00 p.m. may not be returned until the following business day.  We are closed weekends and major holidays.  You do have access to a nurse 24-7, just call the main number to the clinic [phone] and do not press any options, hold on the line and a nurse will answer the phone.    For prescription refill requests, have your pharmacy contact our office and allow 72 hours.    Due to Covid, you will need to wear a mask upon entering the hospital. If you do not have a mask, a mask will be given to you at the Main Entrance upon arrival. For doctor visits, patients may have 1 support person age 69 or older with them. For treatment visits, patients can not have anyone with them due to social distancing guidelines and our immunocompromised population.

## 2023-06-28 DIAGNOSIS — R809 Proteinuria, unspecified: Secondary | ICD-10-CM | POA: Diagnosis not present

## 2023-06-28 DIAGNOSIS — I129 Hypertensive chronic kidney disease with stage 1 through stage 4 chronic kidney disease, or unspecified chronic kidney disease: Secondary | ICD-10-CM | POA: Diagnosis not present

## 2023-06-28 DIAGNOSIS — N182 Chronic kidney disease, stage 2 (mild): Secondary | ICD-10-CM | POA: Diagnosis not present

## 2023-08-05 ENCOUNTER — Ambulatory Visit (INDEPENDENT_AMBULATORY_CARE_PROVIDER_SITE_OTHER)

## 2023-08-05 VITALS — Ht 61.0 in | Wt 129.0 lb

## 2023-08-05 DIAGNOSIS — Z0000 Encounter for general adult medical examination without abnormal findings: Secondary | ICD-10-CM

## 2023-08-05 DIAGNOSIS — Z780 Asymptomatic menopausal state: Secondary | ICD-10-CM | POA: Diagnosis not present

## 2023-08-05 DIAGNOSIS — Z1231 Encounter for screening mammogram for malignant neoplasm of breast: Secondary | ICD-10-CM

## 2023-08-05 NOTE — Progress Notes (Signed)
 Subjective:   Kaitlin [name] is a 73 y.o. who presents for a Medicare Wellness preventive visit.  As a reminder, Annual Wellness Visits don't include a physical exam, and some assessments may be limited, especially if this visit is performed virtually. We may recommend an in-person follow-up visit with your provider if needed.  Visit Complete: Virtual I connected with  [name] on 08/05/23 by a audio enabled telemedicine application and verified that I am speaking with the correct person using two identifiers.  Patient Location: Home  Provider Location: Home Office  I discussed the limitations of evaluation and management by telemedicine. The patient expressed understanding and agreed to proceed.  Vital Signs: Because this visit was a virtual/telehealth visit, some criteria may be missing or patient reported. Any vitals not documented were not able to be obtained and vitals that have been documented are patient reported.  VideoDeclined- This patient declined Librarian, academic. Therefore the visit was completed with audio only.  Persons Participating in Visit: Patient.  AWV Questionnaire: No: Patient Medicare AWV questionnaire was not completed prior to this visit.  Cardiac Risk Factors include: advanced age (>72men, >3 women);dyslipidemia;hypertension;smoking/ tobacco exposure;Other (see comment), Risk factor comments: emphysema, copd, history of MI,     Objective:    Today's Vitals   08/05/23 1543  Weight: 129 lb (58.5 kg)  Height: 5' 1 (1.549 m)   Body mass index is 24.37 kg/m.     08/05/2023    3:43 PM 06/19/2023    9:54 AM 02/02/2023    4:39 PM 02/02/2023    2:23 PM 12/19/2022    8:11 AM 07/30/2022    1:43 PM 06/22/2022   11:41 AM  Advanced Directives  Does Patient Have a Medical Advance Directive? No No No No No No No  Would patient like information on creating a medical advance directive? No - Patient declined No - Patient declined No  - Patient declined  No - Patient declined No - Patient declined     Current Medications (verified) Outpatient Encounter Medications as of 08/05/2023  Medication Sig   albuterol  (VENTOLIN  HFA) 108 (90 Base) MCG/ACT inhaler Inhale 2 puffs into the lungs every 6 (six) hours as needed for wheezing or shortness of breath.   aspirin  81 MG chewable tablet Chew 81 mg by mouth daily.   budesonide -formoterol  (SYMBICORT ) 160-4.5 MCG/ACT inhaler INHALE 2 PUFFS INTO THE LUNGS TWICE A DAY   cholecalciferol (VITAMIN D ) 1000 units tablet Take 2 Units by mouth daily.    clopidogrel  (PLAVIX ) 75 MG tablet TAKE 1 TABLET BY MOUTH EVERY DAY   co-enzyme Q-10 30 MG capsule Take 100 mg by mouth daily.   dextromethorphan  (DELSYM ) 30 MG/5ML liquid Take 5 mLs (30 mg total) by mouth 2 (two) times daily as needed for cough.   ezetimibe  (ZETIA ) 10 MG tablet Take 1 tablet (10 mg total) by mouth daily.   ferrous sulfate 325 (65 FE) MG tablet Take 325 mg by mouth daily with breakfast.   lisinopril  (ZESTRIL ) 2.5 MG tablet TAKE 1 TABLET BY MOUTH EVERY DAY   nitroGLYCERIN  (NITROSTAT ) 0.4 MG SL tablet Place 1 tablet (0.4 mg total) under the tongue every 5 (five) minutes as needed for chest pain.   rosuvastatin  (CRESTOR ) 40 MG tablet Take 1 tablet (40 mg total) by mouth daily.   vitamin B-12 (CYANOCOBALAMIN ) 500 MCG tablet Take 500 mcg by mouth daily.   No facility-administered encounter medications on file as of 08/05/2023.    Allergies (  verified) Patient has no known allergies.   History: Past Medical History:  Diagnosis Date   AAA (abdominal aortic aneurysm) (HCC)    Anemia    Arthritis    Asthma    CAD (coronary artery disease)    stents   Calculus of gallbladder without cholecystitis without obstruction    Carotid artery disease (HCC)    Chronic kidney disease, stage 3a (HCC)    COPD (chronic obstructive pulmonary disease) (HCC)    Emphysema of lung (HCC)    GERD (gastroesophageal reflux disease)     Hyperlipidemia    Hypertension    Iliac artery stenosis, right (HCC)    60%-70%   Myocardial infarction (HCC)    Normocytic anemia 01/26/2017   PAD (peripheral artery disease) (HCC) 12/14/2010   in the left vertebral, bilateral carotids   Past Surgical History:  Procedure Laterality Date   CARDIAC CATHETERIZATION  02/04/2005   A cypher 5.3 x 18 mm at 16 atmosphere of pressure in the mid LAD, this stent covered the proximal part of the LAD and also the mid LAD, this was then post dilated with 3.25 x 15 mm Quantum at 16 atmosphereof pressure for 45 seconds   CATARACT EXTRACTION Bilateral    CHOLECYSTECTOMY N/A 08/16/2017   Procedure: LAPAROSCOPIC CHOLECYSTECTOMY;  Surgeon: Mavis Anes, MD;  Location: [facility];  Service: General;  Laterality: N/A;   COLONOSCOPY N/A 03/01/2017   Procedure: COLONOSCOPY;  Surgeon: Harvey Margo CROME, MD;  Location: [facility];  Service: Endoscopy;  Laterality: N/A;  1:00pm   ESOPHAGOGASTRODUODENOSCOPY N/A 01/28/2017   Procedure: ESOPHAGOGASTRODUODENOSCOPY (EGD);  Surgeon: Albertus Gordy HERO, MD;  Location: [facility] END[facility];  Service: Gastroenterology;  Laterality: N/A;   GIVENS CAPSULE STUDY N/A 03/12/2017   Procedure: GIVENS CAPSULE STUDY;  Surgeon: Harvey Margo CROME, MD;  Location: [facility];  Service: Endoscopy;  Laterality: N/A;  7:30am   TUBAL LIGATION     Family History  Problem Relation Age of Onset   Heart attack Mother    Hyperlipidemia Mother    Hypertension Mother    Diabetes Mother    Heart attack Father    Early death Father 77   Hyperlipidemia Father    Hypertension Father    Heart disease Sister    Hyperlipidemia Sister    Hypertension Sister    Diabetes Brother    Heart disease Sister    Hyperlipidemia Sister    Hypertension Sister    Heart attack Sister    Heart disease Sister    Hyperlipidemia Sister    Hypertension Sister    Heart attack Brother    Early death Brother 32   Hyperlipidemia Brother    Hypertension Brother    Heart  attack Brother    Early death Brother 75   Hyperlipidemia Brother    Hypertension Brother    Cancer Maternal Aunt    Colon cancer Neg Hx    Colon polyps Neg Hx    Social History   Socioeconomic History   Marital status: Married    Spouse name: Debby   Number of children: 3   Years of education: 14   Highest education level: Not on file  Occupational History   Occupation: RETIRED    Comment: warehouse/textiles  Tobacco Use   Smoking status: Every Day    Current packs/day: 1.00    Average packs/day: 1 pack/day for 56.5 years (56.5 ttl pk-yrs)    Types: Cigarettes    Start date: 02/09/1967   Smokeless tobacco: Never  Vaping Use   Vaping status: Never Used  Substance and Sexual Activity   Alcohol use: No   Drug use: No   Sexual activity: Yes    Birth control/protection: Post-menopausal  Other Topics Concern   Not on file  Social History Narrative   Retired   Has an AA in [employer] with husband Debby for 23 years   Stays busy with home, gardens, likes to can   Social Drivers of Corporate investment banker Strain: Low Risk  (08/05/2023)   Overall Financial Resource Strain (CARDIA)    Difficulty of Paying Living Expenses: Not hard at all  Food Insecurity: No Food Insecurity (08/05/2023)   Hunger Vital Sign    Worried About Running Out of Food in the Last Year: Never true    Ran Out of Food in the Last Year: Never true  Transportation Needs: No Transportation Needs (08/05/2023)   PRAPARE - Administrator, Civil Service (Medical): No    Lack of Transportation (Non-Medical): No  Physical Activity: Sufficiently Active (08/05/2023)   Exercise Vital Sign    Days of Exercise per Week: 7 days    Minutes of Exercise per Session: 30 min  Stress: No Stress Concern Present (08/05/2023)   [employer] of Occupational Health - Occupational Stress Questionnaire    Feeling of Stress: Not at all  Social Connections: Moderately Integrated (08/05/2023)   Social Connection  and Isolation Panel    Frequency of Communication with Friends and Family: More than three times a week    Frequency of Social Gatherings with Friends and Family: More than three times a week    Attends Religious Services: More than 4 times per year    Active Member of [employer] or Organizations: Yes    Attends Engineer, structural: More than 4 times per year    Marital Status: Divorced    Tobacco Counseling Ready to quit: No Counseling given: Yes    Clinical Intake:  Pre-visit preparation completed: Yes  Pain : No/denies pain     BMI - recorded: 24.37 Nutritional Status: BMI of 19-24  Normal Nutritional Risks: None Diabetes: No  Lab Results  Component Value Date   HGBA1C 5.2 01/26/2017   HGBA1C 5.5 04/09/2014     How often do you need to have someone help you when you read instructions, pamphlets, or other written materials from your doctor or pharmacy?: 1 - Never  Interpreter Needed?: No  Information entered by :: A Iveth Heidemann, CMA (AAMA)   Activities of Daily Living     08/05/2023    3:45 PM 02/02/2023    4:39 PM  In your present state of health, do you have any difficulty performing the following activities:  Hearing? 0 0  Vision? 0 0  Difficulty concentrating or making decisions? 0 0  Walking or climbing stairs? 0   Dressing or bathing? 0   Doing errands, shopping? 0 0  Preparing Food and eating ? N   Using the Toilet? N   In the past six months, have you accidently leaked urine? N   Do you have problems with loss of bowel control? N   Managing your Medications? N   Managing your Finances? N   Housekeeping or managing your Housekeeping? N     Patient Care Team: Tobie Suzzane POUR, MD as PCP - General (Internal Medicine) Alvan, Dorn FALCON, MD as Consulting Physician (Cardiology) Cindie Carlin POUR, DO as Consulting Physician (Internal Medicine) Darroll Anes,  DO (Optometry) Branch, Dorn FALCON, MD as Consulting Physician (Cardiology) Pennington,  Rebekah M, PA-C as Physician Assistant (Oncology) Rachele Gaynell RAMAN, MD as Referring Physician (Nephrology)  I have updated your Care Teams any recent Medical Services you may have received from other providers in the past year.     Assessment:   This is a routine wellness examination for Desirre.  Hearing/Vision screen Hearing Screening - Comments:: Patient denies any hearing difficulties.   Vision Screening - Comments:: Patient is not up to date on yearly exams. She will call Dr. Oneil Cotter's office to schedule an exam.    Goals Addressed             This Visit's Progress    DIET - EAT MORE FRUITS AND VEGETABLES       Patient Stated   On track    Increase water intake      STOP SMOKING   On track      Depression Screen     08/05/2023    3:48 PM 02/25/2023    1:08 PM 02/06/2023    9:46 AM 08/22/2022    1:18 PM 07/30/2022    1:49 PM 02/19/2022    1:35 PM 08/17/2021    1:10 PM  PHQ 2/9 Scores  PHQ - 2 Score 0 0 0 0 0 0 0  PHQ- 9 Score 0 0         Fall Risk     08/05/2023    3:44 PM 02/25/2023    1:08 PM 02/06/2023    9:46 AM 08/22/2022    1:18 PM 07/30/2022    1:49 PM  Fall Risk   Falls in the past year? 0 0 0 0 0  Number falls in past yr: 0 0 0 0 0  Injury with Fall? 0 0 0 0 0  Risk for fall due to : No Fall Risks No Fall Risks No Fall Risks  No Fall Risks  Follow up Falls evaluation completed;Education provided;Falls prevention discussed Falls evaluation completed Falls evaluation completed  Falls prevention discussed    MEDICARE RISK AT HOME:  Medicare Risk at Home Any stairs in or around the home?: Yes If so, are there any without handrails?: No Home free of loose throw rugs in walkways, pet beds, electrical cords, etc?: Yes Adequate lighting in your home to reduce risk of falls?: Yes Life alert?: No Use of a cane, walker or w/c?: No Grab bars in the bathroom?: Yes Shower chair or bench in shower?: Yes Elevated toilet seat or a handicapped toilet?: No  TIMED  UP AND GO:  Was the test performed?  No  Cognitive Function: 6CIT completed    07/24/2021    1:12 PM  MMSE - Mini Mental State Exam  Not completed: Unable to complete        08/05/2023    3:46 PM 07/30/2022    1:46 PM 07/24/2021    1:12 PM 07/20/2020    1:31 PM 05/11/2019   11:07 AM  6CIT Screen  What Year? 0 points 0 points 0 points 0 points 0 points  What month? 0 points 0 points 0 points 0 points 0 points  What time? 0 points 0 points 0 points 0 points 0 points  Count back from 20 0 points 0 points 0 points 0 points 0 points  Months in reverse 0 points 0 points 0 points 0 points 0 points  Repeat phrase 0 points 0 points 0 points 0 points 0 points  Total Score 0 points 0 points 0 points 0 points 0 points    Immunizations Immunization History  Administered Date(s) Administered   Fluad Quad(high Dose 65+) 10/17/2020   Fluad Trivalent(High Dose 65+) 02/21/2023   Influenza, High Dose Seasonal PF 11/06/2017   Influenza,inj,Quad PF,6+ Mos 10/25/2016, 12/04/2018   Influenza-Unspecified 11/27/2021   Moderna Sars-Covid-2 Vaccination 03/25/2019, 04/22/2019   PNEUMOCOCCAL CONJUGATE-20 02/16/2021   Tdap 01/30/2011   Zoster Recombinant(Shingrix) 05/03/2022, 08/14/2022    Screening Tests Health Maintenance  Topic Date Due   COVID-19 Vaccine (3 - Moderna risk series) 05/20/2019   DTaP/Tdap/Td (2 - Td or Tdap) 01/29/2021   INFLUENZA VACCINE  08/30/2023   DEXA SCAN  09/13/2023   MAMMOGRAM  09/17/2023   Lung Cancer Screening  02/13/2024   Medicare Annual Wellness (AWV)  08/04/2024   Colonoscopy  03/02/2027   Pneumococcal Vaccine: 50+ Years  Completed   Hepatitis C Screening  Completed   Zoster Vaccines- Shingrix  Completed   Hepatitis B Vaccines  Aged Out   HPV VACCINES  Aged Out   Meningococcal B Vaccine  Aged Out    Health Maintenance  Health Maintenance Due  Topic Date Due   COVID-19 Vaccine (3 - Moderna risk series) 05/20/2019   DTaP/Tdap/Td (2 - Td or Tdap) 01/29/2021    Health Maintenance Items Addressed: Mammogram ordered, DEXA ordered  Additional Screening:  Vision Screening: Recommended annual ophthalmology exams for early detection of glaucoma and other disorders of the eye. Would you like a referral to an eye doctor? No    Dental Screening: Recommended annual dental exams for proper oral hygiene  Community Resource Referral / Chronic Care Management: CRR required this visit?  No   CCM required this visit?  No   Plan:    I have personally reviewed and noted the following in the patient's chart:   Medical and social history Use of alcohol, tobacco or illicit drugs  Current medications and supplements including opioid prescriptions. Patient is not currently taking opioid prescriptions. Functional ability and status Nutritional status Physical activity Advanced directives List of other physicians Hospitalizations, surgeries, and ER visits in previous 12 months Vitals Screenings to include cognitive, depression, and falls Referrals and appointments  In addition, I have reviewed and discussed with patient certain preventive protocols, quality metrics, and best practice recommendations. A written personalized care plan for preventive services as well as general preventive health recommendations were provided to patient.   Shraga Custard, CMA   08/05/2023   After Visit Summary: (MyChart) Due to this being a telephonic visit, the after visit summary with patients personalized plan was offered to patient via MyChart   Notes: Nothing significant to report at this time.

## 2023-08-05 NOTE — Patient Instructions (Signed)
 Kaitlin [name] ,  Thank you for taking time out of your busy schedule to complete your Annual Wellness Visit with me. I enjoyed our conversation and look forward to speaking with you again next year. I, as well as your care team,  appreciate your ongoing commitment to your health goals. Please review the following plan we discussed and let me know if I can assist you in the future.  I enjoyed our conversation and look forward to it again next year. Blessing for the upcoming year!!  -Rhylynn Perdomo  Your Game plan/ To Do List   Referrals Placed:  Orders for a bone density scan and yearly mammogram were placed for you today Please call the number below to schedule your appt. [facility] [phone]   Follow up Visits:  Next PCP appointment: September 02, 2023 at 1:00 pm   1 Year Medicare Wellness Visit: August 05, 2024 at 8:40 am video visit   Clinician Recommendations:  Aim for 30 minutes of exercise or brisk walking, 6-8 glasses of water, and 5 servings of fruits and vegetables each day.       This is a list of the screening recommended for you and due dates:  Health Maintenance  Topic Date Due   COVID-19 Vaccine (3 - Moderna risk series) 05/20/2019   DTaP/Tdap/Td vaccine (2 - Td or Tdap) 01/29/2021   Flu Shot  08/30/2023   DEXA scan (bone density measurement)  09/13/2023   Mammogram  09/17/2023   Screening for Lung Cancer  02/13/2024   Medicare Annual Wellness Visit  08/04/2024   Colon Cancer Screening  03/02/2027   Pneumococcal Vaccine for age over 11  Completed   Hepatitis C Screening  Completed   Zoster (Shingles) Vaccine  Completed   Hepatitis B Vaccine  Aged Out   HPV Vaccine  Aged Out   Meningitis B Vaccine  Aged Out    Advanced directives: (Declined) Advance directive discussed with you today. Even though you declined this today, please call our office should you change your mind, and we can give you the proper paperwork for you to fill out. Advance Care Planning is important  because it:  [x]  Makes sure you receive the medical care that is consistent with your values, goals, and preferences  [x]  It provides guidance to your family and loved ones and reduces their decisional burden about whether or not they are making the right decisions based on your wishes.  Follow the link provided in your after visit summary or read over the paperwork we have mailed to you to help you started getting your Advance Directives in place. If you need assistance in completing these, please reach out to us  so that we can help you!

## 2023-08-06 ENCOUNTER — Ambulatory Visit: Payer: Medicare Other

## 2023-08-17 ENCOUNTER — Other Ambulatory Visit: Payer: Self-pay | Admitting: Cardiology

## 2023-09-02 ENCOUNTER — Encounter: Payer: Self-pay | Admitting: Internal Medicine

## 2023-09-02 ENCOUNTER — Ambulatory Visit (INDEPENDENT_AMBULATORY_CARE_PROVIDER_SITE_OTHER): Payer: Medicare Other | Admitting: Internal Medicine

## 2023-09-02 VITALS — BP 100/59 | HR 100 | Ht 61.0 in | Wt 132.0 lb

## 2023-09-02 DIAGNOSIS — J432 Centrilobular emphysema: Secondary | ICD-10-CM | POA: Diagnosis not present

## 2023-09-02 DIAGNOSIS — N1832 Chronic kidney disease, stage 3b: Secondary | ICD-10-CM

## 2023-09-02 DIAGNOSIS — I739 Peripheral vascular disease, unspecified: Secondary | ICD-10-CM

## 2023-09-02 DIAGNOSIS — I2510 Atherosclerotic heart disease of native coronary artery without angina pectoris: Secondary | ICD-10-CM | POA: Diagnosis not present

## 2023-09-02 DIAGNOSIS — E782 Mixed hyperlipidemia: Secondary | ICD-10-CM

## 2023-09-02 DIAGNOSIS — I714 Abdominal aortic aneurysm, without rupture, unspecified: Secondary | ICD-10-CM | POA: Diagnosis not present

## 2023-09-02 MED ORDER — FLUTICASONE-SALMETEROL 250-50 MCG/ACT IN AEPB: 1.0000 | INHALATION_SPRAY | Freq: Two times a day (BID) | RESPIRATORY_TRACT | 5 refills | Status: AC

## 2023-09-02 MED ORDER — ALBUTEROL SULFATE HFA 108 (90 BASE) MCG/ACT IN AERS: 2.0000 | INHALATION_SPRAY | Freq: Four times a day (QID) | RESPIRATORY_TRACT | 5 refills | Status: AC | PRN

## 2023-09-02 NOTE — Assessment & Plan Note (Signed)
 No current leg claudication symptoms Followed by Cardiology On Aspirin, Plavix and statin

## 2023-09-02 NOTE — Assessment & Plan Note (Addendum)
 Last BMP reviewed, GFR ranges around 35 now, last BMP showed improvement of GFR -  69 in 05/25 Checked CBC and BMP Followed by nephrology - advised to contact Dr. Rachele On Lisinopril  2.5 mg now Needs to improve fluid intake, avoid soft drinks Avoid nephrotoxic agents including NSAIDs

## 2023-09-02 NOTE — Assessment & Plan Note (Addendum)
 Recent US  aorta reviewed, showed stable sized infrarenal AAA Next US  abdomen for AAA follow-up in 04/28

## 2023-09-02 NOTE — Progress Notes (Signed)
 Established Patient Office Visit  Subjective:  Patient ID: Kaitlin [name], female    DOB: 09-Jan-1951  Age: 73 y.o. MRN: [id number]  CC:  Chief Complaint  Patient presents with   Care Management    Six month follow up    HPI Kaitlin [name] is a 73 y.o. female with past medical history of CAD s/p stent placement, PAD, AAA, carotid stenosis s/p right carotid and arterectomy, HLD, COPD, CKD stage IIIb and tobacco abuse who presents for f/u of her chronic medical conditions.  She sees cardiologist for history of CAD, PAD, AAA and carotid stenosis.  She is on aspirin , Plavix  and statin.  She denies any chest pain, dyspnea, LE swelling or leg claudication.  She has seen Dr. Rachele for CKD stage IIIb.  Last BMP from chart has been reviewed and discussed with the patient.  She reports that she has increased fluid intake. She has cut down coffee intake. She denies any dysuria or hematuria currently.  Denies any urinary hesitance or resistance.  She needs follow-up visit with Dr. Rachele.  COPD: She has started using Symbicort , and reports an improvement in her chronic cough. But she reports hoarseness in the throat with it. She has used albuterol  occasionally for dyspnea.  She denies any fever, chills or wheezing currently.     Past Medical History:  Diagnosis Date   AAA (abdominal aortic aneurysm) (HCC)    Anemia    Arthritis    Asthma    CAD (coronary artery disease)    stents   Calculus of gallbladder without cholecystitis without obstruction    Carotid artery disease (HCC)    Chronic kidney disease, stage 3a (HCC)    COPD (chronic obstructive pulmonary disease) (HCC)    Emphysema of lung (HCC)    GERD (gastroesophageal reflux disease)    Hyperlipidemia    Hypertension    Iliac artery stenosis, right (HCC)    60%-70%   Myocardial infarction (HCC)    Normocytic anemia 01/26/2017   PAD (peripheral artery disease) (HCC) 12/14/2010   in the left vertebral, bilateral carotids     Past Surgical History:  Procedure Laterality Date   CARDIAC CATHETERIZATION  02/04/2005   A cypher 5.3 x 18 mm at 16 atmosphere of pressure in the mid LAD, this stent covered the proximal part of the LAD and also the mid LAD, this was then post dilated with 3.25 x 15 mm Quantum at 16 atmosphereof pressure for 45 seconds   CATARACT EXTRACTION Bilateral    CHOLECYSTECTOMY N/A 08/16/2017   Procedure: LAPAROSCOPIC CHOLECYSTECTOMY;  Surgeon: Mavis Anes, MD;  Location: [facility];  Service: General;  Laterality: N/A;   COLONOSCOPY N/A 03/01/2017   Procedure: COLONOSCOPY;  Surgeon: Harvey Margo CROME, MD;  Location: [facility];  Service: Endoscopy;  Laterality: N/A;  1:00pm   ESOPHAGOGASTRODUODENOSCOPY N/A 01/28/2017   Procedure: ESOPHAGOGASTRODUODENOSCOPY (EGD);  Surgeon: Albertus Gordy HERO, MD;  Location: [facility];  Service: Gastroenterology;  Laterality: N/A;   GIVENS CAPSULE STUDY N/A 03/12/2017   Procedure: GIVENS CAPSULE STUDY;  Surgeon: Harvey Margo CROME, MD;  Location: [facility];  Service: Endoscopy;  Laterality: N/A;  7:30am   TUBAL LIGATION      Family History  Problem Relation Age of Onset   Heart attack Mother    Hyperlipidemia Mother    Hypertension Mother    Diabetes Mother    Heart attack Father    Early death Father 24   Hyperlipidemia Father  Hypertension Father    Heart disease Sister    Hyperlipidemia Sister    Hypertension Sister    Diabetes Brother    Heart disease Sister    Hyperlipidemia Sister    Hypertension Sister    Heart attack Sister    Heart disease Sister    Hyperlipidemia Sister    Hypertension Sister    Heart attack Brother    Early death Brother 43   Hyperlipidemia Brother    Hypertension Brother    Heart attack Brother    Early death Brother 8   Hyperlipidemia Brother    Hypertension Brother    Cancer Maternal Aunt    Colon cancer Neg Hx    Colon polyps Neg Hx     Social History   Socioeconomic History   Marital status: Married     Spouse name: Debby   Number of children: 3   Years of education: 14   Highest education level: Not on file  Occupational History   Occupation: RETIRED    Comment: warehouse/textiles  Tobacco Use   Smoking status: Every Day    Current packs/day: 1.00    Average packs/day: 1 pack/day for 56.6 years (56.6 ttl pk-yrs)    Types: Cigarettes    Start date: 02/09/1967   Smokeless tobacco: Never  Vaping Use   Vaping status: Never Used  Substance and Sexual Activity   Alcohol use: No   Drug use: No   Sexual activity: Yes    Birth control/protection: Post-menopausal  Other Topics Concern   Not on file  Social History Narrative   Retired   Has an AA in [employer] with husband Debby for 23 years   Stays busy with home, gardens, likes to can   Social Drivers of Corporate investment banker Strain: Low Risk  (08/05/2023)   Overall Financial Resource Strain (CARDIA)    Difficulty of Paying Living Expenses: Not hard at all  Food Insecurity: No Food Insecurity (08/05/2023)   Hunger Vital Sign    Worried About Running Out of Food in the Last Year: Never true    Ran Out of Food in the Last Year: Never true  Transportation Needs: No Transportation Needs (08/05/2023)   PRAPARE - Administrator, Civil Service (Medical): No    Lack of Transportation (Non-Medical): No  Physical Activity: Sufficiently Active (08/05/2023)   Exercise Vital Sign    Days of Exercise per Week: 7 days    Minutes of Exercise per Session: 30 min  Stress: No Stress Concern Present (08/05/2023)   [employer] of Occupational Health - Occupational Stress Questionnaire    Feeling of Stress: Not at all  Social Connections: Moderately Integrated (08/05/2023)   Social Connection and Isolation Panel    Frequency of Communication with Friends and Family: More than three times a week    Frequency of Social Gatherings with Friends and Family: More than three times a week    Attends Religious Services: More than 4 times  per year    Active Member of [employer] or Organizations: Yes    Attends Banker Meetings: More than 4 times per year    Marital Status: Divorced  Intimate Partner Violence: Not At Risk (08/05/2023)   Humiliation, Afraid, Rape, and Kick questionnaire    Fear of Current or Ex-Partner: No    Emotionally Abused: No    Physically Abused: No    Sexually Abused: No    Outpatient Medications Prior to Visit  Medication Sig Dispense Refill   aspirin  81 MG chewable tablet Chew 81 mg by mouth daily.     cholecalciferol (VITAMIN D ) 1000 units tablet Take 2 Units by mouth daily.      clopidogrel  (PLAVIX ) 75 MG tablet TAKE 1 TABLET BY MOUTH EVERY DAY 90 tablet 2   co-enzyme Q-10 30 MG capsule Take 100 mg by mouth daily.     dextromethorphan  (DELSYM ) 30 MG/5ML liquid Take 5 mLs (30 mg total) by mouth 2 (two) times daily as needed for cough. 89 mL 0   ezetimibe  (ZETIA ) 10 MG tablet Take 1 tablet (10 mg total) by mouth daily. 90 tablet 3   ferrous sulfate 325 (65 FE) MG tablet Take 325 mg by mouth daily with breakfast.     lisinopril  (ZESTRIL ) 2.5 MG tablet TAKE 1 TABLET BY MOUTH EVERY DAY 90 tablet 3   nitroGLYCERIN  (NITROSTAT ) 0.4 MG SL tablet Place 1 tablet (0.4 mg total) under the tongue every 5 (five) minutes as needed for chest pain. 25 tablet 3   rosuvastatin  (CRESTOR ) 40 MG tablet Take 1 tablet (40 mg total) by mouth daily. 90 tablet 3   vitamin B-12 (CYANOCOBALAMIN ) 500 MCG tablet Take 500 mcg by mouth daily.     albuterol  (VENTOLIN  HFA) 108 (90 Base) MCG/ACT inhaler Inhale 2 puffs into the lungs every 6 (six) hours as needed for wheezing or shortness of breath. 18 g 5   budesonide -formoterol  (SYMBICORT ) 160-4.5 MCG/ACT inhaler INHALE 2 PUFFS INTO THE LUNGS TWICE A DAY 10.2 each 3   No facility-administered medications prior to visit.    No Known Allergies  ROS Review of Systems  Constitutional:  Negative for chills and fever.  HENT:  Negative for congestion, sinus pressure, sinus  pain and sore throat.   Eyes:  Negative for pain and discharge.  Respiratory:  Negative for cough and shortness of breath.   Cardiovascular:  Negative for chest pain and palpitations.  Gastrointestinal:  Negative for abdominal pain, diarrhea, nausea and vomiting.  Endocrine: Negative for polydipsia and polyuria.  Genitourinary:  Negative for dysuria and hematuria.  Musculoskeletal:  Negative for neck pain and neck stiffness.  Skin:  Negative for rash.  Neurological:  Negative for dizziness and weakness.  Psychiatric/Behavioral:  Negative for agitation and behavioral problems.       Objective:    Physical Exam Vitals reviewed.  Constitutional:      General: She is not in acute distress.    Appearance: She is not diaphoretic.  HENT:     Head: Normocephalic and atraumatic.     Nose: Nose normal.     Mouth/Throat:     Mouth: Mucous membranes are moist.  Eyes:     General: No scleral icterus.    Extraocular Movements: Extraocular movements intact.  Cardiovascular:     Rate and Rhythm: Normal rate and regular rhythm.     Heart sounds: Normal heart sounds. No murmur heard. Pulmonary:     Breath sounds: Normal breath sounds. No wheezing or rales.  Musculoskeletal:     Cervical back: Neck supple. No tenderness.     Right lower leg: No edema.     Left lower leg: No edema.  Skin:    General: Skin is warm.     Findings: No rash.  Neurological:     General: No focal deficit present.     Mental Status: She is alert and oriented to person, place, and time.     Sensory: No sensory deficit.  Motor: No weakness.  Psychiatric:        Mood and Affect: Mood normal.        Behavior: Behavior normal.     BP (!) 100/59   Pulse 100   Ht 5' 1 (1.549 m)   Wt 132 lb (59.9 kg)   SpO2 97%   BMI 24.94 kg/m  Wt Readings from Last 3 Encounters:  09/02/23 132 lb (59.9 kg)  08/05/23 129 lb (58.5 kg)  06/19/23 126 lb 12.2 oz (57.5 kg)    Lab Results  Component Value Date   TSH  1.080 02/21/2023   Lab Results  Component Value Date   WBC 5.6 06/12/2023   HGB 12.8 06/12/2023   HCT 40.0 06/12/2023   MCV 98.5 06/12/2023   PLT 169 06/12/2023   Lab Results  Component Value Date   NA 143 06/12/2023   K 4.1 06/12/2023   CO2 25 06/12/2023   GLUCOSE 88 06/12/2023   BUN 17 06/12/2023   CREATININE 2.16 (H) 06/12/2023   BILITOT 0.5 06/12/2023   ALKPHOS 76 06/12/2023   AST 22 06/12/2023   ALT 16 06/12/2023   PROT 6.8 06/12/2023   ALBUMIN 3.8 06/12/2023   CALCIUM  9.1 06/12/2023   ANIONGAP 10 06/12/2023   EGFR 34 (L) 10/17/2020   Lab Results  Component Value Date   CHOL 173 02/21/2023   Lab Results  Component Value Date   HDL 54 02/21/2023   Lab Results  Component Value Date   LDLCALC 94 02/21/2023   Lab Results  Component Value Date   TRIG 141 02/21/2023   Lab Results  Component Value Date   CHOLHDL 3.2 02/21/2023   Lab Results  Component Value Date   HGBA1C 5.2 01/26/2017      Assessment & Plan:   Problem List Items Addressed This Visit       Cardiovascular and Mediastinum   CAD (coronary artery disease) - Primary   S/p stent placement Followed by Cardiology On Aspirin , Plavix  and statin      PAD (peripheral artery disease) (HCC)   No current leg claudication symptoms Followed by Cardiology On Aspirin , Plavix  and statin      AAA (abdominal aortic aneurysm) (HCC)   Recent US  aorta reviewed, showed stable sized infrarenal AAA Next US  abdomen for AAA follow-up in 04/28        Respiratory   Emphysema lung (HCC)   Well controlled currently, but has hoarseness with Symbicort  DC Symbicort , switched to Advair as maintenance inhaler Uses albuterol  as needed CT chest reviewed, discussed with the patient      Relevant Medications   albuterol  (VENTOLIN  HFA) 108 (90 Base) MCG/ACT inhaler   fluticasone -salmeterol (ADVAIR) 250-50 MCG/ACT AEPB     Genitourinary   Stage 3b chronic kidney disease (HCC)   Last BMP reviewed, GFR  ranges around 35 now, last BMP showed improvement of GFR -  69 in 05/25 Checked CBC and BMP Followed by nephrology - advised to contact Dr. Rachele On Lisinopril  2.5 mg now Needs to improve fluid intake, avoid soft drinks Avoid nephrotoxic agents including NSAIDs        Other   Hyperlipidemia   On atorvastatin  80 mg once daily Lipid profile reviewed - LDL not at goal Continue Zetia  10 mg QD         Meds ordered this encounter  Medications   albuterol  (VENTOLIN  HFA) 108 (90 Base) MCG/ACT inhaler    Sig: Inhale 2 puffs into the lungs every 6 (six)  hours as needed for wheezing or shortness of breath.    Dispense:  18 g    Refill:  5   fluticasone -salmeterol (ADVAIR) 250-50 MCG/ACT AEPB    Sig: Inhale 1 puff into the lungs in the morning and at bedtime.    Dispense:  60 each    Refill:  5     Follow-up: Return in about 6 months (around 03/04/2024).    Suzzane MARLA Blanch, MD

## 2023-09-02 NOTE — Assessment & Plan Note (Addendum)
 Well controlled currently, but has hoarseness with Symbicort  DC Symbicort , switched to Advair as maintenance inhaler Uses albuterol  as needed CT chest reviewed, discussed with the patient

## 2023-09-02 NOTE — Assessment & Plan Note (Signed)
 S/p stent placement Followed by Cardiology On Aspirin, Plavix and statin

## 2023-09-02 NOTE — Patient Instructions (Signed)
 Please use Advair instead of Symbicort  regularly as maintenance inhaler. Please use Albuterol  as needed for shortness of breath or wheezing.  Please continue to take medications as prescribed.  Please continue to follow low salt diet and perform moderate exercise/walking at least 150 mins/week.  Please maintain at least 64 ounces of fluid intake in a day.

## 2023-09-02 NOTE — Assessment & Plan Note (Signed)
 On atorvastatin  80 mg once daily Lipid profile reviewed - LDL not at goal Continue Zetia  10 mg QD

## 2023-09-11 ENCOUNTER — Other Ambulatory Visit: Payer: Self-pay | Admitting: *Deleted

## 2023-09-18 ENCOUNTER — Ambulatory Visit: Payer: Self-pay | Admitting: Internal Medicine

## 2023-09-18 ENCOUNTER — Ambulatory Visit (HOSPITAL_COMMUNITY)
Admission: RE | Admit: 2023-09-18 | Discharge: 2023-09-18 | Disposition: A | Discharge status: Home / Self Care | Source: Ambulatory Visit | Attending: Internal Medicine | Admitting: Internal Medicine

## 2023-09-18 ENCOUNTER — Encounter (HOSPITAL_COMMUNITY): Payer: Self-pay

## 2023-09-18 DIAGNOSIS — Z1231 Encounter for screening mammogram for malignant neoplasm of breast: Secondary | ICD-10-CM | POA: Insufficient documentation

## 2023-09-18 DIAGNOSIS — Z780 Asymptomatic menopausal state: Secondary | ICD-10-CM | POA: Insufficient documentation

## 2023-09-18 DIAGNOSIS — M810 Age-related osteoporosis without current pathological fracture: Secondary | ICD-10-CM | POA: Diagnosis not present

## 2023-09-19 ENCOUNTER — Telehealth: Payer: Self-pay

## 2023-09-19 NOTE — Telephone Encounter (Signed)
 Copied from CRM [id number]. Topic: General - Other >> Sep 19, 2023  2:07 PM Santiya F wrote: Reason for CRM: Patient is calling in for her bone density test results. Please follow up with patient.

## 2023-09-20 ENCOUNTER — Telehealth: Payer: Self-pay

## 2023-09-20 NOTE — Telephone Encounter (Signed)
 Called patient to go over results for bone density scan. Patient understood call with no remaining questions

## 2023-09-20 NOTE — Telephone Encounter (Signed)
Results have been reviewed.

## 2023-10-09 ENCOUNTER — Ambulatory Visit
Admission: EM | Admit: 2023-10-09 | Discharge: 2023-10-09 | Disposition: A | Discharge status: Home / Self Care | Attending: Family Medicine | Admitting: Family Medicine

## 2023-10-09 DIAGNOSIS — R2231 Localized swelling, mass and lump, right upper limb: Secondary | ICD-10-CM

## 2023-10-09 DIAGNOSIS — W5503XA Scratched by cat, initial encounter: Secondary | ICD-10-CM

## 2023-10-09 MED ORDER — AMOXICILLIN-POT CLAVULANATE 875-125 MG PO TABS: 1.0000 | ORAL_TABLET | Freq: Two times a day (BID) | ORAL | 0 refills | Status: DC

## 2023-10-09 NOTE — ED Provider Notes (Signed)
 [facility]   [id number] 10/09/23 Arrival Time: 0946  ASSESSMENT & PLAN:  1. Localized swelling on right hand   2. Cat scratch    Declines rabies immunoglobulin and vaccine series. She feels cat is a known stray and was not acting abnormally; cat is apparently aggressive at times.    Discharge Instructions      Start the antibiotic prescribed if you hand begins to get red and hot with or without a fever. Otherwise keep wound clean and dry.    Td is UTD. If pt shows signs of infection on hand then will begin: Meds ordered this encounter  Medications   amoxicillin -clavulanate (AUGMENTIN ) 875-125 MG tablet    Sig: Take 1 tablet by mouth every 12 (twelve) hours.    Dispense:  14 tablet    Refill:  0   May f/u with PCP as needed.  Reviewed expectations re: course of current medical issues. Questions answered. Outlined signs and symptoms indicating need for more acute intervention. Patient verbalized understanding. After Visit Summary given.   SUBJECTIVE:  Kaitlin [name] is a 73 y.o. female who reports cat scratch to the right dorsal hand that occurred yesterday. The affected area is now swollen and bruised. Pt states the cat is a known stray that has lived around her house > one year. Swelling has improved today. Scratch did bleed but easily controlled.    OBJECTIVE: Vitals:   10/09/23 1015  BP: (!) 110/58  Pulse: 82  Resp: 20  Temp: 98.6 F (37 C)  TempSrc: Oral  SpO2: 92%    General appearance: alert; no distress HEENT: ; AT Extremities: no edema; moves all extremities normally L hand: warm and dry; superficial scratch over dorsal L hand; measures a couple of cm with gaps in between; no bleeding; all fingers with FROM Psychological: alert and cooperative; normal mood and affect  No Known Allergies  Past Medical History:  Diagnosis Date   AAA (abdominal aortic aneurysm) (HCC)    Anemia    Arthritis    Asthma    CAD (coronary artery  disease)    stents   Calculus of gallbladder without cholecystitis without obstruction    Carotid artery disease (HCC)    Chronic kidney disease, stage 3a (HCC)    COPD (chronic obstructive pulmonary disease) (HCC)    Emphysema of lung (HCC)    GERD (gastroesophageal reflux disease)    Hyperlipidemia    Hypertension    Iliac artery stenosis, right (HCC)    60%-70%   Myocardial infarction (HCC)    Normocytic anemia 01/26/2017   PAD (peripheral artery disease) (HCC) 12/14/2010   in the left vertebral, bilateral carotids   Social History   Socioeconomic History   Marital status: Married    Spouse name: Debby   Number of children: 3   Years of education: 14   Highest education level: Not on file  Occupational History   Occupation: RETIRED    Comment: warehouse/textiles  Tobacco Use   Smoking status: Every Day    Current packs/day: 1.00    Average packs/day: 1 pack/day for 56.7 years (56.7 ttl pk-yrs)    Types: Cigarettes    Start date: 02/09/1967   Smokeless tobacco: Never  Vaping Use   Vaping status: Never Used  Substance and Sexual Activity   Alcohol use: No   Drug use: No   Sexual activity: Yes    Birth control/protection: Post-menopausal  Other Topics Concern   Not on file  Social History  Narrative   Retired   Has an AA in [employer] with husband Debby for 23 years   Stays busy with home, gardens, likes to can   Social Drivers of [employer]: Low Risk  (08/05/2023)   Overall Financial Resource Strain (CARDIA)    Difficulty of Paying Living Expenses: Not hard at all  Food Insecurity: No Food Insecurity (08/05/2023)   Hunger Vital Sign    Worried About Running Out of Food in the Last Year: Never true    Ran Out of Food in the Last Year: Never true  Transportation Needs: No Transportation Needs (08/05/2023)   PRAPARE - Administrator, Civil Service (Medical): No    Lack of Transportation (Non-Medical): No  Physical Activity:  Sufficiently Active (08/05/2023)   Exercise Vital Sign    Days of Exercise per Week: 7 days    Minutes of Exercise per Session: 30 min  Stress: No Stress Concern Present (08/05/2023)   [employer] of Occupational Health - Occupational Stress Questionnaire    Feeling of Stress: Not at all  Social Connections: Moderately Integrated (08/05/2023)   Social Connection and Isolation Panel    Frequency of Communication with Friends and Family: More than three times a week    Frequency of Social Gatherings with Friends and Family: More than three times a week    Attends Religious Services: More than 4 times per year    Active Member of [employer] or Organizations: Yes    Attends Engineer, structural: More than 4 times per year    Marital Status: Divorced  Catering manager Violence: Not At Risk (08/05/2023)   Humiliation, Afraid, Rape, and Kick questionnaire    Fear of Current or Ex-Partner: No    Emotionally Abused: No    Physically Abused: No    Sexually Abused: No   Family History  Problem Relation Age of Onset   Heart attack Mother    Hyperlipidemia Mother    Hypertension Mother    Diabetes Mother    Heart attack Father    Early death Father 9   Hyperlipidemia Father    Hypertension Father    Heart disease Sister    Hyperlipidemia Sister    Hypertension Sister    Diabetes Brother    Heart disease Sister    Hyperlipidemia Sister    Hypertension Sister    Heart attack Sister    Heart disease Sister    Hyperlipidemia Sister    Hypertension Sister    Heart attack Brother    Early death Brother 55   Hyperlipidemia Brother    Hypertension Brother    Heart attack Brother    Early death Brother 39   Hyperlipidemia Brother    Hypertension Brother    Cancer Maternal Aunt    Colon cancer Neg Hx    Colon polyps Neg Hx    Past Surgical History:  Procedure Laterality Date   CARDIAC CATHETERIZATION  02/04/2005   A cypher 5.3 x 18 mm at 16 atmosphere of pressure in the mid LAD,  this stent covered the proximal part of the LAD and also the mid LAD, this was then post dilated with 3.25 x 15 mm Quantum at 16 atmosphereof pressure for 45 seconds   CATARACT EXTRACTION Bilateral    CHOLECYSTECTOMY N/A 08/16/2017   Procedure: LAPAROSCOPIC CHOLECYSTECTOMY;  Surgeon: Mavis Anes, MD;  Location: [facility];  Service: General;  Laterality: N/A;   COLONOSCOPY N/A 03/01/2017  Procedure: COLONOSCOPY;  Surgeon: Harvey Margo CROME, MD;  Location: [facility];  Service: Endoscopy;  Laterality: N/A;  1:00pm   ESOPHAGOGASTRODUODENOSCOPY N/A 01/28/2017   Procedure: ESOPHAGOGASTRODUODENOSCOPY (EGD);  Surgeon: Albertus Gordy HERO, MD;  Location: [facility];  Service: Gastroenterology;  Laterality: N/A;   GIVENS CAPSULE STUDY N/A 03/12/2017   Procedure: GIVENS CAPSULE STUDY;  Surgeon: Harvey Margo CROME, MD;  Location: [facility];  Service: Endoscopy;  Laterality: N/A;  7:30am   TUBAL LIGATION        Rolinda Rogue, MD 10/09/23 1129

## 2023-10-09 NOTE — ED Triage Notes (Addendum)
 Pt reports cat scratch to the right hand that occurred yesterday. The affected area is now swollen and bruised. Pt states the cat is a stray.

## 2023-10-09 NOTE — Discharge Instructions (Signed)
 Start the antibiotic prescribed if you hand begins to get red and hot with or without a fever. Otherwise keep wound clean and dry.

## 2023-11-19 NOTE — Progress Notes (Unsigned)
 Cardiology Office Note    Date:  11/20/2023  ID:  Kaitlin [name], DOB 27-Apr-1950, MRN [id number] Cardiologist: Alvan Carrier, MD Cardiology APP:  Johnson Laymon HERO, PA-C { : History of Present Illness:    Kaitlin [name] is a 73 y.o. female with past medical history of CAD (s/p DES to LAD and BMS x2 to RCA in 2007), PAD (known right external iliac disease), carotid artery stenosis (s/p R CEA), HLD, AAA (at 3.4 cm by imaging in 06/2020), Stage 3-4 CKD and anemia who presents to the office today for 54-month follow-up.   She was last examined by Dr. Alvan in 04/2023 and denied any recent anginal symptoms at that time. Follow-up AAA ultrasound was recommended given the timeframe since last evaluation and it was recommended to obtain repeat carotid dopplers in 2026. LDL had also been above goal and she was switched to Crestor  40 mg daily. It had previously been recommended given her history of complex RCA PCI as well as carotid artery stenosis and PAD, would favor continuing DAPT. Follow-up abdominal ultrasound showed a stable infrarenal abdominal aortic aneurysm measuring 3.4 cm with follow-up imaging recommended in 3 years.  In talking with the patient today, she reports overall doing well from a cardiac perspective since her last office visit. She lives by herself and performs ADL's independently. Also does yard work at times including using the [employer] and planting flowers.  She denies any recent chest pain, palpitations, orthopnea, PND or pitting edema. No recent claudication. She does have baseline dyspnea on exertion but no acute change in symptoms. Uses Advair daily but rarely uses her rescue inhaler (less than once per month). Continues to smoke approximately 1 pack/day.  Studies Reviewed:   EKG: EKG is not ordered today.  Carotid Dopplers: 03/2022 IMPRESSION: 1. Bilateral carotid atherosclerosis. Negative for significant stenosis. Degree of narrowing less than 50% bilaterally  by ultrasound criteria. 2. Patent antegrade vertebral flow bilaterally.  US  Aorta: 04/2023 IMPRESSION: 1. Stable infrarenal abdominal aortic aneurysm measuring 3.4 cm. 2. Consider follow-up ultrasound every 3 years. (Ref.: J Vasc Surg. 2018; 67:2-77 and J Am Coll Radiol 2013;10(10):789-794.)  Physical Exam:   VS:  BP 102/60 (BP Location: Right Arm, Cuff Size: Normal)   Pulse 86   Ht 5' 1 (1.549 m)   Wt 135 lb 6.4 oz (61.4 kg)   SpO2 98%   BMI 25.58 kg/m    Wt Readings from Last 3 Encounters:  11/20/23 135 lb 6.4 oz (61.4 kg)  09/02/23 132 lb (59.9 kg)  08/05/23 129 lb (58.5 kg)     GEN: Well nourished, well developed female appearing in no acute distress NECK: No JVD; No carotid bruits CARDIAC: RRR, no murmurs, rubs, gallops RESPIRATORY:  Clear to auscultation without rales, wheezing or rhonchi  ABDOMEN: Appears non-distended. No obvious abdominal masses. EXTREMITIES: No clubbing or cyanosis. No pitting edema.  Distal pedal pulses are 2+ bilaterally.   Assessment and Plan:   1. Coronary artery disease involving native coronary artery of native heart without angina pectoris - She previously underwent DES placement to the LAD and BMS x 2 to the RCA in 01/2005 and PCI of the RCA was complicated by dissection. No recent anginal symptoms.   - She has been continued on long-term DAPT with ASA 81 mg daily and Plavix  75 mg daily. She denies any evidence of active bleeding and CBC in 05/2023 showed hemoglobin was stable at 12.8 with platelets at 169 K. Continue Zetia  10 mg daily,  Lisinopril  2.5 mg daily and Rosuvastatin  40 mg daily.  2. PAD (peripheral artery disease) - She has known right external iliac artery disease. She remains active at baseline denies any recent claudication symptoms.  She is on ASA and Plavix  as discussed above. Also on Crestor  40 mg daily and Zetia  10 mg daily.  3. Carotid stenosis, bilateral - Dopplers in 03/2022 showed less than 50% stenosis bilaterally  and Dr. Alvan did recommend follow-up imaging in 2026. Will arrange at the time of her next office visit.  4. Abdominal aortic aneurysm (AAA) without rupture, unspecified part - Abdominal ultrasound in 04/2023 showed a stable infrarenal abdominal aortic aneurysm measuring 3.4 cm with follow-up imaging recommended in 3 years (2028).   5. Hyperlipidemia LDL goal <70 - LDL was elevated at 94 when checked in 01/2023 and she was switched to Crestor  40 mg daily at the time of her office visit in 04/2023. Will order a repeat FLP with upcoming labs. Continue current medical therapy with Crestor  40 mg daily and Zetia  10 mg daily for now.  6. Stage 3 CKD - Followed by Dr. Rachele. Creatinine was elevated at 2.16 when checked in 05/2022. She is scheduled for follow-up labs within the next few weeks.  Signed, Laymon CHRISTELLA Qua, PA-C

## 2023-11-20 ENCOUNTER — Encounter: Payer: Self-pay | Admitting: Student

## 2023-11-20 ENCOUNTER — Ambulatory Visit: Attending: Student | Admitting: Student

## 2023-11-20 VITALS — BP 102/60 | HR 86 | Ht 61.0 in | Wt 135.4 lb

## 2023-11-20 DIAGNOSIS — E785 Hyperlipidemia, unspecified: Secondary | ICD-10-CM | POA: Diagnosis not present

## 2023-11-20 DIAGNOSIS — N1832 Chronic kidney disease, stage 3b: Secondary | ICD-10-CM | POA: Diagnosis not present

## 2023-11-20 DIAGNOSIS — I6523 Occlusion and stenosis of bilateral carotid arteries: Secondary | ICD-10-CM | POA: Diagnosis not present

## 2023-11-20 DIAGNOSIS — I10 Essential (primary) hypertension: Secondary | ICD-10-CM

## 2023-11-20 DIAGNOSIS — I739 Peripheral vascular disease, unspecified: Secondary | ICD-10-CM | POA: Diagnosis not present

## 2023-11-20 DIAGNOSIS — I2510 Atherosclerotic heart disease of native coronary artery without angina pectoris: Secondary | ICD-10-CM | POA: Diagnosis not present

## 2023-11-20 DIAGNOSIS — I714 Abdominal aortic aneurysm, without rupture, unspecified: Secondary | ICD-10-CM | POA: Insufficient documentation

## 2023-11-20 NOTE — Patient Instructions (Signed)
 Medication Instructions:  Your physician recommends that you continue on your current medications as directed. Please refer to the Current Medication list given to you today.  *If you need a refill on your cardiac medications before your next appointment, please call your pharmacy*  Lab Work: Your physician recommends that you return for lab work in: Lipid ( Fasting)   If you have labs (blood work) drawn today and your tests are completely normal, you will receive your results only by: MyChart Message (if you have MyChart) OR A paper copy in the mail If you have any lab test that is abnormal or we need to change your treatment, we will call you to review the results.  Testing/Procedures: NONE   Follow-Up: At [facility], you and your health needs are our priority.  As part of our continuing mission to provide you with exceptional heart care, our providers are all part of one team.  This team includes your primary Cardiologist (physician) and Advanced Practice Providers or APPs (Physician Assistants and Nurse Practitioners) who all work together to provide you with the care you need, when you need it.  Your next appointment:   6 month(s)  Provider:   Dorn Ross, MD    We recommend signing up for the patient portal called MyChart.  Sign up information is provided on this After Visit Summary.  MyChart is used to connect with patients for Virtual Visits (Telemedicine).  Patients are able to view lab/test results, encounter notes, upcoming appointments, etc.  Non-urgent messages can be sent to your provider as well.   To learn more about what you can do with MyChart, go to [email or web address].   Other Instructions Thank you for choosing [facility]!

## 2023-12-18 DIAGNOSIS — E559 Vitamin D deficiency, unspecified: Secondary | ICD-10-CM | POA: Diagnosis not present

## 2023-12-18 DIAGNOSIS — I10 Essential (primary) hypertension: Secondary | ICD-10-CM | POA: Diagnosis not present

## 2023-12-18 DIAGNOSIS — N189 Chronic kidney disease, unspecified: Secondary | ICD-10-CM | POA: Diagnosis not present

## 2023-12-18 DIAGNOSIS — D631 Anemia in chronic kidney disease: Secondary | ICD-10-CM | POA: Diagnosis not present

## 2023-12-18 DIAGNOSIS — R809 Proteinuria, unspecified: Secondary | ICD-10-CM | POA: Diagnosis not present

## 2023-12-18 DIAGNOSIS — E119 Type 2 diabetes mellitus without complications: Secondary | ICD-10-CM | POA: Diagnosis not present

## 2023-12-19 ENCOUNTER — Ambulatory Visit: Payer: Self-pay | Admitting: Student

## 2023-12-19 LAB — LIPID PANEL
Chol/HDL Ratio: 2.7 ratio (ref 0.0–4.4)
Cholesterol, Total: 121 mg/dL (ref 100–199)
HDL: 45 mg/dL (ref >39)
LDL Chol Calc (NIH): 46 mg/dL (ref 0–99)
Triglycerides: 180 mg/dL — ABNORMAL HIGH (ref 0–149)
VLDL Cholesterol Cal: 30 mg/dL (ref 5–40)

## 2023-12-27 ENCOUNTER — Other Ambulatory Visit (HOSPITAL_COMMUNITY): Payer: Self-pay | Admitting: Nephrology

## 2023-12-27 ENCOUNTER — Encounter (HOSPITAL_COMMUNITY): Payer: Self-pay | Admitting: Nephrology

## 2023-12-27 DIAGNOSIS — R031 Nonspecific low blood-pressure reading: Secondary | ICD-10-CM

## 2023-12-27 DIAGNOSIS — N182 Chronic kidney disease, stage 2 (mild): Secondary | ICD-10-CM | POA: Diagnosis not present

## 2023-12-27 DIAGNOSIS — N1832 Chronic kidney disease, stage 3b: Secondary | ICD-10-CM

## 2023-12-27 DIAGNOSIS — Z716 Tobacco abuse counseling: Secondary | ICD-10-CM

## 2023-12-27 DIAGNOSIS — I129 Hypertensive chronic kidney disease with stage 1 through stage 4 chronic kidney disease, or unspecified chronic kidney disease: Secondary | ICD-10-CM

## 2023-12-27 DIAGNOSIS — R809 Proteinuria, unspecified: Secondary | ICD-10-CM

## 2023-12-27 DIAGNOSIS — N179 Acute kidney failure, unspecified: Secondary | ICD-10-CM | POA: Diagnosis not present

## 2024-01-01 DIAGNOSIS — N189 Chronic kidney disease, unspecified: Secondary | ICD-10-CM | POA: Diagnosis not present

## 2024-01-02 ENCOUNTER — Ambulatory Visit (HOSPITAL_COMMUNITY): Admission: RE | Admit: 2024-01-02 | Discharge: 2024-01-02 | Attending: Nephrology | Admitting: Nephrology

## 2024-01-02 DIAGNOSIS — R809 Proteinuria, unspecified: Secondary | ICD-10-CM | POA: Insufficient documentation

## 2024-01-02 DIAGNOSIS — N1832 Chronic kidney disease, stage 3b: Secondary | ICD-10-CM | POA: Diagnosis not present

## 2024-01-02 DIAGNOSIS — N281 Cyst of kidney, acquired: Secondary | ICD-10-CM | POA: Diagnosis not present

## 2024-01-02 DIAGNOSIS — I129 Hypertensive chronic kidney disease with stage 1 through stage 4 chronic kidney disease, or unspecified chronic kidney disease: Secondary | ICD-10-CM | POA: Insufficient documentation

## 2024-01-02 DIAGNOSIS — N181 Chronic kidney disease, stage 1: Secondary | ICD-10-CM | POA: Insufficient documentation

## 2024-01-02 DIAGNOSIS — N189 Chronic kidney disease, unspecified: Secondary | ICD-10-CM | POA: Diagnosis not present

## 2024-01-02 DIAGNOSIS — N182 Chronic kidney disease, stage 2 (mild): Secondary | ICD-10-CM | POA: Diagnosis not present

## 2024-01-02 DIAGNOSIS — Z716 Tobacco abuse counseling: Secondary | ICD-10-CM | POA: Insufficient documentation

## 2024-01-13 ENCOUNTER — Ambulatory Visit
Admission: EM | Admit: 2024-01-13 | Discharge: 2024-01-13 | Disposition: A | Discharge status: Home / Self Care | Attending: Nurse Practitioner | Admitting: Nurse Practitioner

## 2024-01-13 DIAGNOSIS — R399 Unspecified symptoms and signs involving the genitourinary system: Secondary | ICD-10-CM | POA: Diagnosis not present

## 2024-01-13 DIAGNOSIS — Z23 Encounter for immunization: Secondary | ICD-10-CM | POA: Diagnosis not present

## 2024-01-13 LAB — POCT URINE DIPSTICK
Bilirubin, UA: NEGATIVE
Glucose, UA: 100 mg/dL — AB
Nitrite, UA: POSITIVE — AB
POC PROTEIN,UA: >=300 — AB
Spec Grav, UA: <=1.005 — AB (ref 1.010–1.025)
Urobilinogen, UA: 2 U/dL — AB
pH, UA: 5 (ref 5.0–8.0)

## 2024-01-13 MED ORDER — CEPHALEXIN 250 MG PO CAPS: 250.0000 mg | ORAL_CAPSULE | Freq: Two times a day (BID) | ORAL | 0 refills | Status: AC

## 2024-01-13 NOTE — ED Triage Notes (Signed)
 Pt reports urinary discomfort, burning with urination, urinary frequency urinary urgency, making small amounts at a time x 5 days. Has tried OTC AZO, water and cranberry juice has found no relief.

## 2024-01-13 NOTE — Discharge Instructions (Signed)
 Take the Keflex  twice daily for 5 days as prescribed to treat UTI.  We will contact you if the urine culture shows we need to change the antibiotic.  Make sure you are drinking plenty of water.  Follow-up in the emergency room if symptoms worsen despite treatment.

## 2024-01-13 NOTE — ED Provider Notes (Signed)
 [facility]    CSN: [id number] Arrival date & time: 01/13/24  1223      History   Chief Complaint No chief complaint on file.   HPI Kaitlin [name] is a 73 y.o. female.   Patient presents today with 4-day history of burning with urination, increased urinary frequency and urgency, voiding smaller amounts, and hematuria.  She denies abdominal pain, flank pain, fever, nausea/vomiting, and vaginal discharge.  Has tried over-the-counter Azo, water, cranberry juice without much improvement.  Patient denies history of UTI.  Denies history of antibiotic use in the past 90 days.  Medical history is positive for CKD.    Past Medical History:  Diagnosis Date   AAA (abdominal aortic aneurysm)    Anemia    Arthritis    Asthma    CAD (coronary artery disease)    a. s/p DES to LAD and BMS x2 to RCA in 2007   Calculus of gallbladder without cholecystitis without obstruction    Carotid artery disease    Chronic kidney disease, stage 3a (HCC)    COPD (chronic obstructive pulmonary disease) (HCC)    Emphysema of lung (HCC)    GERD (gastroesophageal reflux disease)    Hyperlipidemia    Hypertension    Iliac artery stenosis, right    60%-70%   Myocardial infarction (HCC)    Normocytic anemia 01/26/2017   PAD (peripheral artery disease) 12/14/2010   in the left vertebral, bilateral carotids    Patient Active Problem List   Diagnosis Date Noted   Hospital discharge follow-up 02/06/2023   COPD with acute exacerbation (HCC) 02/02/2023   Acute hypoxic respiratory failure (HCC) 02/02/2023   Iron deficiency anemia 06/16/2021   Stage 3b chronic kidney disease (HCC) 10/17/2020   Idiopathic peripheral neuropathy 10/17/2020   Polyp of rectum    Early satiety    Normocytic anemia 01/26/2017   Emphysema lung (HCC) 11/13/2016   Atherosclerosis of aorta 11/13/2016   AAA (abdominal aortic aneurysm) 10/31/2016   CAD (coronary artery disease) 10/17/2012   PAD (peripheral artery  disease) 10/17/2012   Carotid stenosis 10/17/2012   Hyperlipidemia 10/17/2012   Tobacco abuse 10/17/2012    Past Surgical History:  Procedure Laterality Date   CARDIAC CATHETERIZATION  02/04/2005   A cypher 5.3 x 18 mm at 16 atmosphere of pressure in the mid LAD, this stent covered the proximal part of the LAD and also the mid LAD, this was then post dilated with 3.25 x 15 mm Quantum at 16 atmosphereof pressure for 45 seconds   CATARACT EXTRACTION Bilateral    CHOLECYSTECTOMY N/A 08/16/2017   Procedure: LAPAROSCOPIC CHOLECYSTECTOMY;  Surgeon: Mavis Anes, MD;  Location: [facility];  Service: General;  Laterality: N/A;   COLONOSCOPY N/A 03/01/2017   Procedure: COLONOSCOPY;  Surgeon: Harvey Margo CROME, MD;  Location: [facility];  Service: Endoscopy;  Laterality: N/A;  1:00pm   ESOPHAGOGASTRODUODENOSCOPY N/A 01/28/2017   Procedure: ESOPHAGOGASTRODUODENOSCOPY (EGD);  Surgeon: Albertus Gordy HERO, MD;  Location[facility];  Service: Gastroenterology;  Laterality: N/A;   GIVENS CAPSULE STUDY N/A 03/12/2017   Procedure: GIVENS CAPSULE STUDY;  Surgeon: Harvey Margo CROME, MD;  Location: [facility];  Service: Endoscopy;  Laterality: N/A;  7:30am   TUBAL LIGATION      OB History   No obstetric history on file.      Home Medications    Prior to Admission medications  Medication Sig Start Date End Date Taking? Authorizing Provider  cephALEXin  (KEFLEX ) 250 MG capsule  Take 1 capsule (250 mg total) by mouth 2 (two) times daily for 5 days. 01/13/24 01/18/24 Yes Chandra Harlene LABOR, NP  albuterol  (VENTOLIN  HFA) 108 (90 Base) MCG/ACT inhaler Inhale 2 puffs into the lungs every 6 (six) hours as needed for wheezing or shortness of breath. 09/02/23   Tobie Suzzane POUR, MD  aspirin  81 MG chewable tablet Chew 81 mg by mouth daily.    [provider]  cholecalciferol (VITAMIN D ) 1000 units tablet Take 2 Units by mouth daily.     [provider]  clopidogrel  (PLAVIX ) 75 MG tablet TAKE 1 TABLET BY  MOUTH EVERY DAY 08/19/23   Alvan Dorn FALCON, MD  dextromethorphan  (DELSYM ) 30 MG/5ML liquid Take 5 mLs (30 mg total) by mouth 2 (two) times daily as needed for cough. 02/03/23   Johnson, Clanford L, MD  ezetimibe  (ZETIA ) 10 MG tablet Take 1 tablet (10 mg total) by mouth daily. 02/25/23   Tobie Suzzane POUR, MD  ferrous sulfate 325 (65 FE) MG tablet Take 325 mg by mouth daily with breakfast.    [provider]  fluticasone -salmeterol (ADVAIR) 250-50 MCG/ACT AEPB Inhale 1 puff into the lungs in the morning and at bedtime. 09/02/23   Tobie Suzzane POUR, MD  lisinopril  (ZESTRIL ) 2.5 MG tablet TAKE 1 TABLET BY MOUTH EVERY DAY 05/20/23   Alvan Dorn FALCON, MD  nitroGLYCERIN  (NITROSTAT ) 0.4 MG SL tablet Place 1 tablet (0.4 mg total) under the tongue every 5 (five) minutes as needed for chest pain. 05/09/23   Alvan Dorn FALCON, MD  rosuvastatin  (CRESTOR ) 40 MG tablet Take 1 tablet (40 mg total) by mouth daily. 05/09/23 11/20/23  Alvan Dorn FALCON, MD  vitamin B-12 (CYANOCOBALAMIN ) 500 MCG tablet Take 500 mcg by mouth daily.    [provider]    Family History Family History  Problem Relation Age of Onset   Heart attack Mother    Hyperlipidemia Mother    Hypertension Mother    Diabetes Mother    Heart attack Father    Early death Father 70   Hyperlipidemia Father    Hypertension Father    Heart disease Sister    Hyperlipidemia Sister    Hypertension Sister    Diabetes Brother    Heart disease Sister    Hyperlipidemia Sister    Hypertension Sister    Heart attack Sister    Heart disease Sister    Hyperlipidemia Sister    Hypertension Sister    Heart attack Brother    Early death Brother 16   Hyperlipidemia Brother    Hypertension Brother    Heart attack Brother    Early death Brother 26   Hyperlipidemia Brother    Hypertension Brother    Cancer Maternal Aunt    Colon cancer Neg Hx    Colon polyps Neg Hx     Social History Social History[1]   Allergies   Patient has  no known allergies.   Review of Systems Review of Systems Per HPI  Physical Exam Triage Vital Signs ED Triage Vitals  Encounter Vitals Group     BP 01/13/24 1242 115/70     Girls Systolic BP Percentile --      Girls Diastolic BP Percentile --      Boys Systolic BP Percentile --      Boys Diastolic BP Percentile --      Pulse Rate 01/13/24 1242 100     Resp 01/13/24 1242 20     Temp 01/13/24 1242 98.2 F (  36.8 C)     Temp Source 01/13/24 1242 Oral     SpO2 01/13/24 1242 92 %     Weight --      Height --      Head Circumference --      Peak Flow --      Pain Score 01/13/24 1246 0     Pain Loc --      Pain Education --      Exclude from Growth Chart --    No data found.  Updated Vital Signs BP 115/70 (BP Location: Right Arm)   Pulse 100   Temp 98.2 F (36.8 C) (Oral)   Resp 20   SpO2 92%   Visual Acuity Right Eye Distance:   Left Eye Distance:   Bilateral Distance:    Right Eye Near:   Left Eye Near:    Bilateral Near:     Physical Exam Vitals and nursing note reviewed.  Constitutional:      General: She is not in acute distress.    Appearance: She is not toxic-appearing.  HENT:     Mouth/Throat:     Mouth: Mucous membranes are moist.     Pharynx: Oropharynx is clear.  Abdominal:     General: Abdomen is flat. Bowel sounds are normal. There is no distension.     Palpations: Abdomen is soft. There is no mass.     Tenderness: There is no abdominal tenderness. There is no right CVA tenderness, left CVA tenderness or guarding.  Skin:    General: Skin is warm and dry.     Coloration: Skin is not jaundiced or pale.     Findings: No erythema.  Neurological:     Mental Status: She is alert and oriented to person, place, and time.     Motor: No weakness.     Gait: Gait normal.  Psychiatric:        Behavior: Behavior is cooperative.      UC Treatments / Results  Labs (all labs ordered are listed, but only abnormal results are displayed) Labs Reviewed   POCT URINE DIPSTICK - Abnormal; Notable for the following components:      Result Value   Color, UA orange (*)    Clarity, UA cloudy (*)    Glucose, UA =100 (*)    Ketones, POC UA trace (5) (*)    Spec Grav, UA <=1.005 (*)    Blood, UA large (*)    POC PROTEIN,UA >=300 (*)    Urobilinogen, UA 2.0 (*)    Nitrite, UA Positive (*)    Leukocytes, UA Large (3+) (*)    All other components within normal limits  URINE CULTURE    EKG   Radiology No results found.  Procedures Procedures (including critical care time)  Medications Ordered in UC Medications - No data to display  Initial Impression / Assessment and Plan / UC Course  I have reviewed the triage vital signs and the nursing notes.  Pertinent labs & imaging results that were available during my care of the patient were reviewed by me and considered in my medical decision making (see chart for details).   Patient is a very pleasant, well-appearing 73 year old Caucasian female presenting today for UTI symptoms.  Vital signs are stable and examination is reassuring.  Urinalysis is unreliable due to Azo use.  Urine culture is pending.  In meantime, given history of CKD, will treat with cephalexin  250 mg twice a day for 5 days.  Strict ER precautions discussed with patient.  Supportive care discussed including continue hydration with water.  The patient was given the opportunity to ask questions.  All questions answered to their satisfaction.  The patient is in agreement to this plan.   Final Clinical Impressions(s) / UC Diagnoses   Final diagnoses:  UTI symptoms     Discharge Instructions      Take the Keflex  twice daily for 5 days as prescribed to treat UTI.  We will contact you if the urine culture shows we need to change the antibiotic.  Make sure you are drinking plenty of water.  Follow-up in the emergency room if symptoms worsen despite treatment.    ED Prescriptions     Medication Sig Dispense Auth. Provider    cephALEXin  (KEFLEX ) 250 MG capsule Take 1 capsule (250 mg total) by mouth 2 (two) times daily for 5 days. 10 capsule Chandra Harlene LABOR, NP      PDMP not reviewed this encounter.    [1]  Social History Tobacco Use   Smoking status: Every Day    Current packs/day: 1.00    Average packs/day: 1 pack/day for 56.9 years (56.9 ttl pk-yrs)    Types: Cigarettes    Start date: 02/09/1967   Smokeless tobacco: Never  Vaping Use   Vaping status: Never Used  Substance Use Topics   Alcohol use: No   Drug use: No     Chandra Harlene LABOR, NP 01/13/24 1333

## 2024-01-15 ENCOUNTER — Ambulatory Visit (HOSPITAL_COMMUNITY): Payer: Self-pay

## 2024-01-15 LAB — URINE CULTURE: Culture: >=100000 — AB

## 2024-02-10 ENCOUNTER — Other Ambulatory Visit: Payer: Self-pay | Admitting: Cardiology

## 2024-02-10 ENCOUNTER — Other Ambulatory Visit: Payer: Self-pay | Admitting: Internal Medicine

## 2024-02-10 DIAGNOSIS — E782 Mixed hyperlipidemia: Secondary | ICD-10-CM

## 2024-02-18 ENCOUNTER — Ambulatory Visit (HOSPITAL_COMMUNITY)
Admission: RE | Admit: 2024-02-18 | Discharge: 2024-02-18 | Disposition: A | Discharge status: Home / Self Care | Source: Ambulatory Visit | Attending: Physician Assistant | Admitting: Physician Assistant

## 2024-02-18 ENCOUNTER — Inpatient Hospital Stay: Attending: Physician Assistant

## 2024-02-18 DIAGNOSIS — N1832 Chronic kidney disease, stage 3b: Secondary | ICD-10-CM

## 2024-02-18 DIAGNOSIS — D539 Nutritional anemia, unspecified: Secondary | ICD-10-CM

## 2024-02-18 DIAGNOSIS — Z87891 Personal history of nicotine dependence: Secondary | ICD-10-CM | POA: Insufficient documentation

## 2024-02-18 DIAGNOSIS — D508 Other iron deficiency anemias: Secondary | ICD-10-CM

## 2024-02-18 LAB — VITAMIN B12: Vitamin B-12: 1359 pg/mL — ABNORMAL HIGH (ref 180–914)

## 2024-02-18 LAB — IRON AND TIBC
Iron: 82 ug/dL (ref 28–170)
Saturation Ratios: 24 % (ref 10.4–31.8)
TIBC: 349 ug/dL (ref 250–450)
UIBC: 266 ug/dL

## 2024-02-18 LAB — CBC WITH DIFFERENTIAL/PLATELET
Abs Immature Granulocytes: 0.01 K/uL (ref 0.00–0.07)
Basophils Absolute: 0 K/uL (ref 0.0–0.1)
Basophils Relative: 1 %
Eosinophils Absolute: 0.2 K/uL (ref 0.0–0.5)
Eosinophils Relative: 4 %
HCT: 39.3 % (ref 36.0–46.0)
Hemoglobin: 12.2 g/dL (ref 12.0–15.0)
Immature Granulocytes: 0 %
Lymphocytes Relative: 31 %
Lymphs Abs: 1.5 K/uL (ref 0.7–4.0)
MCH: 31 pg (ref 26.0–34.0)
MCHC: 31 g/dL (ref 30.0–36.0)
MCV: 99.7 fL (ref 80.0–100.0)
Monocytes Absolute: 0.4 K/uL (ref 0.1–1.0)
Monocytes Relative: 7 %
Neutro Abs: 2.8 K/uL (ref 1.7–7.7)
Neutrophils Relative %: 57 %
Platelets: 201 K/uL (ref 150–400)
RBC: 3.94 MIL/uL (ref 3.87–5.11)
RDW: 13.4 % (ref 11.5–15.5)
WBC: 4.9 K/uL (ref 4.0–10.5)
nRBC: 0 % (ref 0.0–0.2)

## 2024-02-18 LAB — FERRITIN: Ferritin: 138 ng/mL (ref 11–307)

## 2024-02-20 LAB — METHYLMALONIC ACID, SERUM: Methylmalonic Acid, Quantitative: 246 nmol/L (ref 0–378)

## 2024-02-20 NOTE — Progress Notes (Signed)
 "  VIRTUAL VISIT via TELEPHONE NOTE [facility]   I connected with Kaitlin [name]  on 02/25/24 at 2:40 PM by telephone and verified that I am speaking with the correct person using two identifiers.  Location: Patient: Home Provider: [facility]   I discussed the limitations, risks, security and privacy concerns of performing an evaluation and management service by telephone and the availability of in person appointments. I also discussed with the patient that there may be a patient responsible charge related to this service. The patient expressed understanding and agreed to proceed.  REASON FOR VISIT:  Follow-up for normocytic/macrocytic anemia secondary to iron deficiency and CKD   CURRENT THERAPY: Oral iron supplementation with IV iron PRN  INTERVAL HISTORY:  Kaitlin [name] is contacted today for follow-up of normocytic/macrocytic anemia, which is secondary to CKD stage III/IV.   She was last seen by Pleasant Barefoot PA-C on 06/19/2023. Her most recent IV iron was with IV Monoferric  on 06/22/2022.  At today's visit, she reports feeling well. She denies any interval hospitalizations, surgeries, or changes in her baseline health status. She has 80% energy and 75% appetite.  She endorses that she is maintaining a stable weight.  She reports good energy. She has been taking ferrous sulfate once daily, in addition to vitamin B12. She denies any recent melena, hematochezia, or hematemesis. She has some nocturnal muscle cramps. She denies any recent headaches, chest pain, lightheadedness, or syncope. She has baseline SOB and cough with underlying COPD.    ASSESSMENT & PLAN:  1.  Normocytic/macrocytic anemia  - Previously seen by hematology for normocytic anemia secondary to CKD stage IIIb/IV, iron deficiency, and B12 deficiency.  - EGD (01/28/2017): Gastritis, normal duodenum - Colonoscopy (03/01/2017): Redundant colon, large rectal polyp removed, external  hemorrhoids - Stool cards have not been checked at our office, patient reports that one of her other providers (Dr. Harvey) checked them and they were negative. - She denies any gross hematochezia or melena - She takes ferrous sulfate and vitamin B12 daily -- Most recent IV iron (Monoferric  x 1) in May 2024 - No clear explanation of macrocytosis - No known liver disease (liver US  in 2018 was normal); folate, TSH, LDH, B12, methylmalonic acid, copper  normal.  No reticulocytosis. - Most recent labs (02/18/2024): Hgb 12.2/MCV 99.7, overall normal CBC/differential.   Ferritin 138, iron saturation 24%. Vitamin B12 is elevated at 1359, normal MMA. - Differential diagnosis favors anemia related to CKD stage IIIb/IV; cause of mild macrocytosis unclear, although macrocytosis has currently resolved - PLAN: No indication for IV iron at this time. - Continue ferrous sulfate daily.  -- DECREASE vitamin B12 to every OTHER day.   - Repeat labs (CBC/D, CMP, ferritin, iron/TIBC, B12, MMA) and RTC in 1 year.  If labs stable at that time, consider discharge to PCP.   2.  Elevated serum free light chains - MGUS panel (08/30/2020): SPEP and immunofixation normal, elevated light chains with kappa 66.9/lambda 32.3/ratio 2.15 - Repeat MGUS panel (06/09/2021): SPEP and immunofixation normal.  Elevated kappa light chain 69.8, elevated lambda light chain 30.4.  Free light chain ratio is slightly elevated at 2.30, but this would be considered within normal limits for patient with CKD.  LDH normal. - Elevated light chains likely secondary to CKD - PLAN: No further work-up at this time.   3.  CKD stage IIIb/IV - Follows with Dr. Rachele   4.  Tobacco abuse - This patient meets criteria  for low-dose CT lung cancer screening.  She has smoked 1 to 2 packs/day since age 56, currently smoking 1 pack/day but trying to quit. - LDCT chest (02/22/2022): Lung RADS category 2, benign appearance or behavior of lung nodules (incidental  findings include 3.0 cm infrarenal abdominal aortic aneurysm, aortic atherosclerosis, emphysema, and coronary artery atherosclerosis) - LDCT chest (02/13/2023): Lung RADS 2S, benign.  (Other findings include aortic atherosclerosis, coronary artery disease, emphysema, and aortic valve calcifications) - LDCT chest (02/18/2024): BI-RADS Category 1, negative - PLAN: Continue annual LDCT screening (next due January 2027).  Follow-up with PCP regarding other findings. - She declines referral to smoking cessation counselor at this time, but has been provided with information.  PLAN SUMMARY: >> LDCT chest due January 2027 >> Labs in 1 year = CBC/D, CMP, B12, MMA, ferritin, iron/TIBC >> OFFICE visit in 1 year (1 week after labs)  ** Last office visit 06/19/23     REVIEW OF SYSTEMS:   Review of Systems  Constitutional:  Negative for chills, diaphoresis, fever, malaise/fatigue and weight loss.  Respiratory:  Negative for cough and shortness of breath.   Cardiovascular:  Negative for chest pain and palpitations.  Gastrointestinal:  Negative for abdominal pain, blood in stool, melena, nausea and vomiting.  Musculoskeletal:  Positive for myalgias (Nocturnal leg cramps).  Neurological:  Negative for dizziness and headaches.     PHYSICAL EXAM: (per limitations of virtual telephone visit)  The patient is alert and oriented x 3, exhibiting adequate mentation, good mood, and ability to speak in full sentences and execute sound judgement.  WRAP UP:   I discussed the assessment and treatment plan with the patient. The patient was provided an opportunity to ask questions and all were answered. The patient agreed with the plan and demonstrated an understanding of the instructions.   The patient was advised to call back or seek an in-person evaluation if the symptoms worsen or if the condition fails to improve as anticipated.  I provided 18 minutes of non-face-to-face time during this encounter, including >10  minutes of medical discussion.  Pleasant CHRISTELLA Barefoot, PA-C 02/25/24 2:56 PM  "

## 2024-02-25 ENCOUNTER — Inpatient Hospital Stay: Admitting: Physician Assistant

## 2024-02-25 DIAGNOSIS — Z87891 Personal history of nicotine dependence: Secondary | ICD-10-CM | POA: Diagnosis not present

## 2024-02-25 DIAGNOSIS — E538 Deficiency of other specified B group vitamins: Secondary | ICD-10-CM | POA: Diagnosis not present

## 2024-02-25 DIAGNOSIS — D631 Anemia in chronic kidney disease: Secondary | ICD-10-CM | POA: Diagnosis not present

## 2024-02-25 DIAGNOSIS — D508 Other iron deficiency anemias: Secondary | ICD-10-CM

## 2024-02-25 DIAGNOSIS — N1832 Chronic kidney disease, stage 3b: Secondary | ICD-10-CM

## 2024-03-02 ENCOUNTER — Encounter: Payer: Self-pay | Admitting: Internal Medicine

## 2024-03-02 ENCOUNTER — Ambulatory Visit: Admitting: Internal Medicine

## 2024-03-02 DIAGNOSIS — M810 Age-related osteoporosis without current pathological fracture: Secondary | ICD-10-CM | POA: Insufficient documentation

## 2024-03-02 DIAGNOSIS — J439 Emphysema, unspecified: Secondary | ICD-10-CM | POA: Diagnosis not present

## 2024-03-02 DIAGNOSIS — D631 Anemia in chronic kidney disease: Secondary | ICD-10-CM | POA: Diagnosis not present

## 2024-03-02 DIAGNOSIS — N1832 Chronic kidney disease, stage 3b: Secondary | ICD-10-CM | POA: Diagnosis not present

## 2024-03-02 DIAGNOSIS — I2510 Atherosclerotic heart disease of native coronary artery without angina pectoris: Secondary | ICD-10-CM

## 2024-03-02 DIAGNOSIS — I739 Peripheral vascular disease, unspecified: Secondary | ICD-10-CM | POA: Diagnosis not present

## 2024-03-02 DIAGNOSIS — E782 Mixed hyperlipidemia: Secondary | ICD-10-CM

## 2024-03-02 NOTE — Progress Notes (Signed)
 "    Virtual Visit via Video Note   Because of [name]'s co-morbid illnesses, she is at least at moderate risk for complications without adequate follow up.  This format is felt to be most appropriate for this patient at this time.  All issues noted in this document were discussed and addressed.  A limited physical exam was performed with this format.      Evaluation Performed:  Follow-up visit  Date:  03/02/2024   ID:  Kaitlin [name], DOB October 21, 1950, MRN [id number]  Patient Location: Home Provider Location: Home Office  Participants: Patient Location of Patient: Home Location of Provider: Telehealth Consent was obtain for visit to be over via telehealth. I verified that I am speaking with the correct person using two identifiers.  PCP:  Tobie Suzzane POUR, MD   Chief Complaint: Follow-up of chronic medical conditions  History of Present Illness:    Kaitlin [name] is a 74 y.o. female with PMH of CAD s/p stent placement, PAD, AAA, carotid stenosis s/p right carotid and arterectomy, HLD, COPD, CKD stage IIIb and tobacco abuse who has a video visit for f/u of her chronic medical conditions.  She sees cardiologist for history of CAD, PAD, AAA and carotid stenosis.  She is on aspirin , Plavix  and statin.  She denies any chest pain, dyspnea, LE swelling or leg claudication.  She has seen Dr. Rachele for CKD stage IIIb.  Last BMP from chart has been reviewed and discussed with the patient.  She reports that she has increased fluid intake. She has cut down coffee intake. She denies any dysuria or hematuria currently.  Denies any urinary hesitance or resistance.  COPD: She has started using Advair, and reports an improvement in her chronic cough. She has used albuterol  occasionally for dyspnea.  She denies any fever, chills or wheezing currently.  The patient does not have symptoms concerning for COVID-19 infection (fever, chills, cough, or new shortness of breath).   Past Medical,  Surgical, Social History, Allergies, and Medications have been Reviewed.  Past Medical History:  Diagnosis Date   AAA (abdominal aortic aneurysm)    Anemia    Arthritis    Asthma    CAD (coronary artery disease)    a. s/p DES to LAD and BMS x2 to RCA in 2007   Calculus of gallbladder without cholecystitis without obstruction    Carotid artery disease    Chronic kidney disease, stage 3a (HCC)    COPD (chronic obstructive pulmonary disease) (HCC)    Emphysema of lung (HCC)    GERD (gastroesophageal reflux disease)    Hyperlipidemia    Hypertension    Iliac artery stenosis, right    60%-70%   Myocardial infarction (HCC)    Normocytic anemia 01/26/2017   PAD (peripheral artery disease) 12/14/2010   in the left vertebral, bilateral carotids   Past Surgical History:  Procedure Laterality Date   CARDIAC CATHETERIZATION  02/04/2005   A cypher 5.3 x 18 mm at 16 atmosphere of pressure in the mid LAD, this stent covered the proximal part of the LAD and also the mid LAD, this was then post dilated with 3.25 x 15 mm Quantum at 16 atmosphereof pressure for 45 seconds   CATARACT EXTRACTION Bilateral    CHOLECYSTECTOMY N/A 08/16/2017   Procedure: LAPAROSCOPIC CHOLECYSTECTOMY;  Surgeon: Mavis Anes, MD;  Location: [facility];  Service: General;  Laterality: N/A;   COLONOSCOPY N/A 03/01/2017   Procedure: COLONOSCOPY;  Surgeon: Harvey Margo CROME, MD;  Location: [facility];  Service: Endoscopy;  Laterality: N/A;  1:00pm   ESOPHAGOGASTRODUODENOSCOPY N/A 01/28/2017   Procedure: ESOPHAGOGASTRODUODENOSCOPY (EGD);  Surgeon: Albertus Gordy HERO, MD;  Location[facility];  Service: Gastroenterology;  Laterality: N/A;   GIVENS CAPSULE STUDY N/A 03/12/2017   Procedure: GIVENS CAPSULE STUDY;  Surgeon: Harvey Margo CROME, MD;  Location: [facility];  Service: Endoscopy;  Laterality: N/A;  7:30am   TUBAL LIGATION       Active Medications[1]   Allergies:   Patient has no known allergies.   ROS:   Please see the  history of present illness. All other systems reviewed and are negative.   Labs/Other Tests and Data Reviewed:    Recent Labs: 06/12/2023: ALT 16; BUN 17; Creatinine, Ser 2.16; Potassium 4.1; Sodium 143 02/18/2024: Hemoglobin 12.2; Platelets 201   Recent Lipid Panel Lab Results  Component Value Date/Time   CHOL 121 12/18/2023 11:43 AM   TRIG 180 (H) 12/18/2023 11:43 AM   HDL 45 12/18/2023 11:43 AM   CHOLHDL 2.7 12/18/2023 11:43 AM   CHOLHDL 3.2 02/09/2022 09:31 AM   LDLCALC 46 12/18/2023 11:43 AM   LDLCALC 42 08/13/2019 01:07 PM    Wt Readings from Last 3 Encounters:  11/20/23 135 lb 6.4 oz (61.4 kg)  09/02/23 132 lb (59.9 kg)  08/05/23 129 lb (58.5 kg)     Objective:    Vital Signs:  There were no vitals taken for this visit.   VITAL SIGNS:  reviewed GEN:  no acute distress EYES:  sclerae anicteric, EOMI - Extraocular Movements Intact RESPIRATORY:  normal respiratory effort, symmetric expansion NEURO:  alert and oriented x 3, no obvious focal deficit PSYCH:  normal affect  ASSESSMENT & PLAN:    Assessment & Plan Emphysema lung (HCC) Well controlled currently Has Advair as maintenance inhaler Uses albuterol  as needed CT chest reviewed, discussed with the patient Stage 3b chronic kidney disease (HCC) Last BMP reviewed, GFR ranges around 29 now Checked CBC and BMP Followed by nephrology - advised to contact Dr. Rachele On Lisinopril  2.5 mg now Needs to improve fluid intake, avoid soft drinks Avoid nephrotoxic agents including NSAIDs Mixed hyperlipidemia On Crestor  40 mg QD Lipid profile reviewed - LDL at goal Continue Zetia  10 mg QD as well Coronary artery disease involving native coronary artery of native heart without angina pectoris S/p stent placement Followed by Cardiology On Aspirin , Plavix  and statin PAD (peripheral artery disease) No current leg claudication symptoms Followed by Cardiology On Aspirin , Plavix  and statin Age-related osteoporosis  without current pathological fracture Last DEXA scan reviewed Would avoid bisphosphonate due to progressive CKD Discussed about Prolia, but she prefers toto wait for now Continue vitamin D  supplement Anemia in stage 3b chronic kidney disease (HCC) Followed by hematology Continue oral iron supplement    I discussed the assessment and treatment plan with the patient. The patient was provided an opportunity to ask questions, and all were answered. The patient agreed with the plan and demonstrated an understanding of the instructions.   The patient was advised to call back or seek an in-person evaluation if the symptoms worsen or if the condition fails to improve as anticipated.  The above assessment and management plan was discussed with the patient. The patient verbalized understanding of and has agreed to the management plan.   Medication Adjustments/Labs and Tests Ordered: Current medicines are reviewed at length with the patient today.  Concerns regarding medicines are outlined above.   Tests Ordered: No orders of the  defined types were placed in this encounter.   Medication Changes: No orders of the defined types were placed in this encounter.    Note: This dictation was prepared with Dragon dictation along with smaller phrase technology. Similar sounding words can be transcribed inadequately or may not be corrected upon review. Any transcriptional errors that result from this process are unintentional.      Disposition:  Follow up  Signed, Suzzane MARLA Blanch, MD  03/02/2024 1:05 PM     [facility]     [1]  Current Meds  Medication Sig   albuterol  (VENTOLIN  HFA) 108 (90 Base) MCG/ACT inhaler Inhale 2 puffs into the lungs every 6 (six) hours as needed for wheezing or shortness of breath.   aspirin  81 MG chewable tablet Chew 81 mg by mouth daily.   cholecalciferol (VITAMIN D ) 1000 units tablet Take 2 Units by mouth daily.    clopidogrel   (PLAVIX ) 75 MG tablet TAKE 1 TABLET BY MOUTH EVERY DAY   dextromethorphan  (DELSYM ) 30 MG/5ML liquid Take 5 mLs (30 mg total) by mouth 2 (two) times daily as needed for cough.   ezetimibe  (ZETIA ) 10 MG tablet TAKE 1 TABLET BY MOUTH EVERY DAY   ferrous sulfate 325 (65 FE) MG tablet Take 325 mg by mouth daily with breakfast.   fluticasone -salmeterol (ADVAIR) 250-50 MCG/ACT AEPB Inhale 1 puff into the lungs in the morning and at bedtime.   lisinopril  (ZESTRIL ) 2.5 MG tablet TAKE 1 TABLET BY MOUTH EVERY DAY   nitroGLYCERIN  (NITROSTAT ) 0.4 MG SL tablet Place 1 tablet (0.4 mg total) under the tongue every 5 (five) minutes as needed for chest pain.   rosuvastatin  (CRESTOR ) 40 MG tablet TAKE 1 TABLET BY MOUTH EVERY DAY   vitamin B-12 (CYANOCOBALAMIN ) 500 MCG tablet Take 500 mcg by mouth daily.   "

## 2024-03-02 NOTE — Patient Instructions (Signed)
 Please continue to take medications as prescribed.  Please continue to follow low salt diet and perform moderate exercise/walking as tolerated.

## 2024-03-02 NOTE — Assessment & Plan Note (Addendum)
 Followed by hematology Continue oral iron supplement

## 2024-03-02 NOTE — Assessment & Plan Note (Addendum)
 Well controlled currently Has Advair as maintenance inhaler Uses albuterol  as needed CT chest reviewed, discussed with the patient

## 2024-03-02 NOTE — Assessment & Plan Note (Addendum)
 Last BMP reviewed, GFR ranges around 29 now Checked CBC and BMP Followed by nephrology - advised to contact Dr. Rachele On Lisinopril  2.5 mg now Needs to improve fluid intake, avoid soft drinks Avoid nephrotoxic agents including NSAIDs

## 2024-03-02 NOTE — Assessment & Plan Note (Addendum)
 Last DEXA scan reviewed Would avoid bisphosphonate due to progressive CKD Discussed about Prolia, but she prefers toto wait for now Continue vitamin D  supplement

## 2024-03-02 NOTE — Assessment & Plan Note (Addendum)
 On Crestor  40 mg QD Lipid profile reviewed - LDL at goal Continue Zetia  10 mg QD as well

## 2024-03-02 NOTE — Assessment & Plan Note (Addendum)
 No current leg claudication symptoms Followed by Cardiology On Aspirin, Plavix and statin

## 2024-03-02 NOTE — Assessment & Plan Note (Addendum)
 S/p stent placement Followed by Cardiology On Aspirin, Plavix and statin

## 2024-08-05 ENCOUNTER — Ambulatory Visit

## 2025-02-22 ENCOUNTER — Ambulatory Visit (HOSPITAL_COMMUNITY)

## 2025-02-22 ENCOUNTER — Inpatient Hospital Stay

## 2025-03-01 ENCOUNTER — Inpatient Hospital Stay: Admitting: Physician Assistant
